# Patient Record
Sex: Male | Born: 1940
Health system: Southern US, Community
[De-identification: ages and names within clinical notes are randomized; demographics above are authoritative.]

## PROBLEM LIST (undated history)

## (undated) DIAGNOSIS — I639 Cerebral infarction, unspecified: Secondary | ICD-10-CM

## (undated) DIAGNOSIS — E118 Type 2 diabetes mellitus with unspecified complications: Secondary | ICD-10-CM

## (undated) DIAGNOSIS — K219 Gastro-esophageal reflux disease without esophagitis: Secondary | ICD-10-CM

## (undated) DIAGNOSIS — I499 Cardiac arrhythmia, unspecified: Secondary | ICD-10-CM

## (undated) DIAGNOSIS — J189 Pneumonia, unspecified organism: Secondary | ICD-10-CM

## (undated) DIAGNOSIS — E785 Hyperlipidemia, unspecified: Secondary | ICD-10-CM

## (undated) DIAGNOSIS — M1990 Unspecified osteoarthritis, unspecified site: Secondary | ICD-10-CM

## (undated) DIAGNOSIS — Z87442 Personal history of urinary calculi: Secondary | ICD-10-CM

## (undated) DIAGNOSIS — E119 Type 2 diabetes mellitus without complications: Secondary | ICD-10-CM

## (undated) DIAGNOSIS — I10 Essential (primary) hypertension: Secondary | ICD-10-CM

## (undated) DIAGNOSIS — N189 Chronic kidney disease, unspecified: Secondary | ICD-10-CM

## (undated) HISTORY — PX: VENTRAL HERNIA REPAIR: SHX424

## (undated) HISTORY — PX: CATARACT EXTRACTION W/ INTRAOCULAR LENS IMPLANT: SHX1309

## (undated) HISTORY — DX: Essential (primary) hypertension: I10

## (undated) HISTORY — PX: CARDIAC CATHETERIZATION: SHX172

## (undated) HISTORY — DX: Hyperlipidemia, unspecified: E78.5

## (undated) HISTORY — DX: Type 2 diabetes mellitus without complications: E11.9

## (undated) HISTORY — PX: COLONOSCOPY: SHX174

---
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
## 2015-04-07 DIAGNOSIS — I10 Essential (primary) hypertension: Secondary | ICD-10-CM | POA: Insufficient documentation

## 2015-04-07 DIAGNOSIS — R55 Syncope and collapse: Secondary | ICD-10-CM | POA: Insufficient documentation

## 2015-05-22 HISTORY — PX: CARDIAC CATHETERIZATION: SHX172

## 2015-05-24 DIAGNOSIS — R9431 Abnormal electrocardiogram [ECG] [EKG]: Secondary | ICD-10-CM | POA: Diagnosis not present

## 2015-05-24 DIAGNOSIS — I491 Atrial premature depolarization: Secondary | ICD-10-CM | POA: Diagnosis not present

## 2015-05-24 DIAGNOSIS — R9439 Abnormal result of other cardiovascular function study: Secondary | ICD-10-CM | POA: Diagnosis not present

## 2015-05-24 DIAGNOSIS — I499 Cardiac arrhythmia, unspecified: Secondary | ICD-10-CM | POA: Diagnosis not present

## 2015-05-24 DIAGNOSIS — I10 Essential (primary) hypertension: Secondary | ICD-10-CM | POA: Diagnosis not present

## 2015-05-24 DIAGNOSIS — R55 Syncope and collapse: Secondary | ICD-10-CM | POA: Diagnosis not present

## 2015-06-01 DIAGNOSIS — E119 Type 2 diabetes mellitus without complications: Secondary | ICD-10-CM | POA: Diagnosis not present

## 2015-06-14 DIAGNOSIS — E119 Type 2 diabetes mellitus without complications: Secondary | ICD-10-CM | POA: Diagnosis not present

## 2015-06-16 DIAGNOSIS — D225 Melanocytic nevi of trunk: Secondary | ICD-10-CM | POA: Diagnosis not present

## 2015-06-16 DIAGNOSIS — L820 Inflamed seborrheic keratosis: Secondary | ICD-10-CM | POA: Diagnosis not present

## 2015-06-16 DIAGNOSIS — L570 Actinic keratosis: Secondary | ICD-10-CM | POA: Diagnosis not present

## 2015-06-16 DIAGNOSIS — L821 Other seborrheic keratosis: Secondary | ICD-10-CM | POA: Diagnosis not present

## 2015-06-16 DIAGNOSIS — X32XXXA Exposure to sunlight, initial encounter: Secondary | ICD-10-CM | POA: Diagnosis not present

## 2015-06-28 DIAGNOSIS — R001 Bradycardia, unspecified: Secondary | ICD-10-CM | POA: Diagnosis not present

## 2015-06-28 DIAGNOSIS — I10 Essential (primary) hypertension: Secondary | ICD-10-CM | POA: Diagnosis not present

## 2015-06-28 DIAGNOSIS — E119 Type 2 diabetes mellitus without complications: Secondary | ICD-10-CM | POA: Diagnosis not present

## 2015-07-11 DIAGNOSIS — I10 Essential (primary) hypertension: Secondary | ICD-10-CM | POA: Diagnosis not present

## 2015-07-11 DIAGNOSIS — I499 Cardiac arrhythmia, unspecified: Secondary | ICD-10-CM | POA: Diagnosis not present

## 2015-07-11 DIAGNOSIS — R9439 Abnormal result of other cardiovascular function study: Secondary | ICD-10-CM | POA: Diagnosis not present

## 2015-07-11 DIAGNOSIS — R9431 Abnormal electrocardiogram [ECG] [EKG]: Secondary | ICD-10-CM | POA: Diagnosis not present

## 2015-07-11 DIAGNOSIS — R55 Syncope and collapse: Secondary | ICD-10-CM | POA: Diagnosis not present

## 2015-07-11 DIAGNOSIS — I491 Atrial premature depolarization: Secondary | ICD-10-CM | POA: Diagnosis not present

## 2015-07-14 DIAGNOSIS — H10423 Simple chronic conjunctivitis, bilateral: Secondary | ICD-10-CM | POA: Diagnosis not present

## 2015-07-21 DIAGNOSIS — H10423 Simple chronic conjunctivitis, bilateral: Secondary | ICD-10-CM | POA: Diagnosis not present

## 2015-07-21 DIAGNOSIS — H43813 Vitreous degeneration, bilateral: Secondary | ICD-10-CM | POA: Diagnosis not present

## 2015-07-29 DIAGNOSIS — H10423 Simple chronic conjunctivitis, bilateral: Secondary | ICD-10-CM | POA: Diagnosis not present

## 2015-08-04 DIAGNOSIS — I491 Atrial premature depolarization: Secondary | ICD-10-CM | POA: Diagnosis not present

## 2015-08-04 DIAGNOSIS — R9439 Abnormal result of other cardiovascular function study: Secondary | ICD-10-CM | POA: Diagnosis not present

## 2015-08-04 DIAGNOSIS — I10 Essential (primary) hypertension: Secondary | ICD-10-CM | POA: Diagnosis not present

## 2015-08-16 DIAGNOSIS — H10423 Simple chronic conjunctivitis, bilateral: Secondary | ICD-10-CM | POA: Diagnosis not present

## 2015-09-20 DIAGNOSIS — E785 Hyperlipidemia, unspecified: Secondary | ICD-10-CM | POA: Diagnosis not present

## 2015-09-20 DIAGNOSIS — E119 Type 2 diabetes mellitus without complications: Secondary | ICD-10-CM | POA: Diagnosis not present

## 2015-09-28 DIAGNOSIS — E119 Type 2 diabetes mellitus without complications: Secondary | ICD-10-CM | POA: Diagnosis not present

## 2015-09-28 DIAGNOSIS — I4891 Unspecified atrial fibrillation: Secondary | ICD-10-CM | POA: Diagnosis not present

## 2015-09-28 DIAGNOSIS — I10 Essential (primary) hypertension: Secondary | ICD-10-CM | POA: Diagnosis not present

## 2015-09-28 DIAGNOSIS — E785 Hyperlipidemia, unspecified: Secondary | ICD-10-CM | POA: Diagnosis not present

## 2015-12-02 DIAGNOSIS — H02834 Dermatochalasis of left upper eyelid: Secondary | ICD-10-CM | POA: Diagnosis not present

## 2015-12-02 DIAGNOSIS — H524 Presbyopia: Secondary | ICD-10-CM | POA: Diagnosis not present

## 2015-12-02 DIAGNOSIS — H52223 Regular astigmatism, bilateral: Secondary | ICD-10-CM | POA: Diagnosis not present

## 2015-12-02 DIAGNOSIS — H2513 Age-related nuclear cataract, bilateral: Secondary | ICD-10-CM | POA: Diagnosis not present

## 2015-12-02 DIAGNOSIS — H04123 Dry eye syndrome of bilateral lacrimal glands: Secondary | ICD-10-CM | POA: Diagnosis not present

## 2015-12-02 DIAGNOSIS — H5203 Hypermetropia, bilateral: Secondary | ICD-10-CM | POA: Diagnosis not present

## 2015-12-02 DIAGNOSIS — E119 Type 2 diabetes mellitus without complications: Secondary | ICD-10-CM | POA: Diagnosis not present

## 2015-12-02 DIAGNOSIS — H43813 Vitreous degeneration, bilateral: Secondary | ICD-10-CM | POA: Diagnosis not present

## 2015-12-02 DIAGNOSIS — H02831 Dermatochalasis of right upper eyelid: Secondary | ICD-10-CM | POA: Diagnosis not present

## 2015-12-26 DIAGNOSIS — E119 Type 2 diabetes mellitus without complications: Secondary | ICD-10-CM | POA: Diagnosis not present

## 2015-12-26 DIAGNOSIS — E782 Mixed hyperlipidemia: Secondary | ICD-10-CM | POA: Diagnosis not present

## 2016-01-04 DIAGNOSIS — K589 Irritable bowel syndrome without diarrhea: Secondary | ICD-10-CM | POA: Diagnosis not present

## 2016-01-04 DIAGNOSIS — E119 Type 2 diabetes mellitus without complications: Secondary | ICD-10-CM | POA: Diagnosis not present

## 2016-01-04 DIAGNOSIS — E785 Hyperlipidemia, unspecified: Secondary | ICD-10-CM | POA: Diagnosis not present

## 2016-01-04 DIAGNOSIS — I10 Essential (primary) hypertension: Secondary | ICD-10-CM | POA: Diagnosis not present

## 2016-01-24 DIAGNOSIS — I491 Atrial premature depolarization: Secondary | ICD-10-CM | POA: Diagnosis not present

## 2016-01-24 DIAGNOSIS — I10 Essential (primary) hypertension: Secondary | ICD-10-CM | POA: Diagnosis not present

## 2016-01-24 DIAGNOSIS — R9439 Abnormal result of other cardiovascular function study: Secondary | ICD-10-CM | POA: Diagnosis not present

## 2016-02-07 DIAGNOSIS — Z905 Acquired absence of kidney: Secondary | ICD-10-CM | POA: Diagnosis not present

## 2016-02-07 DIAGNOSIS — N200 Calculus of kidney: Secondary | ICD-10-CM | POA: Diagnosis not present

## 2016-02-07 DIAGNOSIS — Z7901 Long term (current) use of anticoagulants: Secondary | ICD-10-CM | POA: Diagnosis not present

## 2016-02-07 DIAGNOSIS — I491 Atrial premature depolarization: Secondary | ICD-10-CM | POA: Diagnosis not present

## 2016-02-07 DIAGNOSIS — I480 Paroxysmal atrial fibrillation: Secondary | ICD-10-CM | POA: Diagnosis not present

## 2016-02-07 DIAGNOSIS — I129 Hypertensive chronic kidney disease with stage 1 through stage 4 chronic kidney disease, or unspecified chronic kidney disease: Secondary | ICD-10-CM | POA: Diagnosis not present

## 2016-02-07 DIAGNOSIS — E785 Hyperlipidemia, unspecified: Secondary | ICD-10-CM | POA: Diagnosis not present

## 2016-02-07 DIAGNOSIS — E1122 Type 2 diabetes mellitus with diabetic chronic kidney disease: Secondary | ICD-10-CM | POA: Diagnosis not present

## 2016-02-07 DIAGNOSIS — E1142 Type 2 diabetes mellitus with diabetic polyneuropathy: Secondary | ICD-10-CM | POA: Diagnosis not present

## 2016-02-07 DIAGNOSIS — I517 Cardiomegaly: Secondary | ICD-10-CM | POA: Diagnosis not present

## 2016-02-07 DIAGNOSIS — I2510 Atherosclerotic heart disease of native coronary artery without angina pectoris: Secondary | ICD-10-CM | POA: Diagnosis not present

## 2016-02-07 DIAGNOSIS — Z7984 Long term (current) use of oral hypoglycemic drugs: Secondary | ICD-10-CM | POA: Diagnosis not present

## 2016-02-07 DIAGNOSIS — Z87891 Personal history of nicotine dependence: Secondary | ICD-10-CM | POA: Diagnosis not present

## 2016-02-07 DIAGNOSIS — Z01812 Encounter for preprocedural laboratory examination: Secondary | ICD-10-CM | POA: Diagnosis not present

## 2016-02-07 DIAGNOSIS — R9439 Abnormal result of other cardiovascular function study: Secondary | ICD-10-CM | POA: Diagnosis not present

## 2016-02-07 DIAGNOSIS — Z794 Long term (current) use of insulin: Secondary | ICD-10-CM | POA: Diagnosis not present

## 2016-02-07 DIAGNOSIS — Z01810 Encounter for preprocedural cardiovascular examination: Secondary | ICD-10-CM | POA: Diagnosis not present

## 2016-02-07 DIAGNOSIS — Z79899 Other long term (current) drug therapy: Secondary | ICD-10-CM | POA: Diagnosis not present

## 2016-02-07 DIAGNOSIS — N189 Chronic kidney disease, unspecified: Secondary | ICD-10-CM | POA: Diagnosis not present

## 2016-02-07 DIAGNOSIS — K589 Irritable bowel syndrome without diarrhea: Secondary | ICD-10-CM | POA: Diagnosis not present

## 2016-02-07 DIAGNOSIS — I771 Stricture of artery: Secondary | ICD-10-CM | POA: Diagnosis not present

## 2016-02-07 DIAGNOSIS — Z7982 Long term (current) use of aspirin: Secondary | ICD-10-CM | POA: Diagnosis not present

## 2016-02-07 DIAGNOSIS — K219 Gastro-esophageal reflux disease without esophagitis: Secondary | ICD-10-CM | POA: Diagnosis not present

## 2016-02-07 DIAGNOSIS — R9430 Abnormal result of cardiovascular function study, unspecified: Secondary | ICD-10-CM | POA: Diagnosis not present

## 2016-02-07 DIAGNOSIS — I25118 Atherosclerotic heart disease of native coronary artery with other forms of angina pectoris: Secondary | ICD-10-CM | POA: Diagnosis not present

## 2016-02-07 DIAGNOSIS — R079 Chest pain, unspecified: Secondary | ICD-10-CM | POA: Diagnosis not present

## 2016-02-08 DIAGNOSIS — I480 Paroxysmal atrial fibrillation: Secondary | ICD-10-CM | POA: Diagnosis not present

## 2016-02-08 DIAGNOSIS — I25118 Atherosclerotic heart disease of native coronary artery with other forms of angina pectoris: Secondary | ICD-10-CM | POA: Diagnosis not present

## 2016-02-08 DIAGNOSIS — R079 Chest pain, unspecified: Secondary | ICD-10-CM | POA: Diagnosis not present

## 2016-02-08 DIAGNOSIS — I2510 Atherosclerotic heart disease of native coronary artery without angina pectoris: Secondary | ICD-10-CM | POA: Diagnosis not present

## 2016-02-08 DIAGNOSIS — E1142 Type 2 diabetes mellitus with diabetic polyneuropathy: Secondary | ICD-10-CM | POA: Diagnosis not present

## 2016-02-08 DIAGNOSIS — R9439 Abnormal result of other cardiovascular function study: Secondary | ICD-10-CM | POA: Diagnosis not present

## 2016-02-08 DIAGNOSIS — E785 Hyperlipidemia, unspecified: Secondary | ICD-10-CM | POA: Diagnosis not present

## 2016-02-08 DIAGNOSIS — E1122 Type 2 diabetes mellitus with diabetic chronic kidney disease: Secondary | ICD-10-CM | POA: Diagnosis not present

## 2016-02-08 DIAGNOSIS — I491 Atrial premature depolarization: Secondary | ICD-10-CM | POA: Diagnosis not present

## 2016-02-08 DIAGNOSIS — I129 Hypertensive chronic kidney disease with stage 1 through stage 4 chronic kidney disease, or unspecified chronic kidney disease: Secondary | ICD-10-CM | POA: Diagnosis not present

## 2016-03-22 DIAGNOSIS — I10 Essential (primary) hypertension: Secondary | ICD-10-CM | POA: Diagnosis not present

## 2016-03-22 DIAGNOSIS — I491 Atrial premature depolarization: Secondary | ICD-10-CM | POA: Diagnosis not present

## 2016-03-22 DIAGNOSIS — R9439 Abnormal result of other cardiovascular function study: Secondary | ICD-10-CM | POA: Diagnosis not present

## 2016-03-22 DIAGNOSIS — R55 Syncope and collapse: Secondary | ICD-10-CM | POA: Diagnosis not present

## 2016-03-22 DIAGNOSIS — R9431 Abnormal electrocardiogram [ECG] [EKG]: Secondary | ICD-10-CM | POA: Diagnosis not present

## 2016-04-17 DIAGNOSIS — E119 Type 2 diabetes mellitus without complications: Secondary | ICD-10-CM | POA: Diagnosis not present

## 2016-04-17 DIAGNOSIS — K589 Irritable bowel syndrome without diarrhea: Secondary | ICD-10-CM | POA: Diagnosis not present

## 2016-04-17 DIAGNOSIS — I10 Essential (primary) hypertension: Secondary | ICD-10-CM | POA: Diagnosis not present

## 2016-07-02 DIAGNOSIS — E119 Type 2 diabetes mellitus without complications: Secondary | ICD-10-CM | POA: Diagnosis not present

## 2016-07-02 DIAGNOSIS — E785 Hyperlipidemia, unspecified: Secondary | ICD-10-CM | POA: Diagnosis not present

## 2016-07-11 DIAGNOSIS — G629 Polyneuropathy, unspecified: Secondary | ICD-10-CM | POA: Diagnosis not present

## 2016-07-11 DIAGNOSIS — E119 Type 2 diabetes mellitus without complications: Secondary | ICD-10-CM | POA: Diagnosis not present

## 2016-07-11 DIAGNOSIS — E785 Hyperlipidemia, unspecified: Secondary | ICD-10-CM | POA: Diagnosis not present

## 2016-07-11 DIAGNOSIS — I10 Essential (primary) hypertension: Secondary | ICD-10-CM | POA: Diagnosis not present

## 2016-11-06 DIAGNOSIS — Z7689 Persons encountering health services in other specified circumstances: Secondary | ICD-10-CM | POA: Diagnosis not present

## 2016-11-06 DIAGNOSIS — I10 Essential (primary) hypertension: Secondary | ICD-10-CM | POA: Diagnosis not present

## 2016-11-06 DIAGNOSIS — E1165 Type 2 diabetes mellitus with hyperglycemia: Secondary | ICD-10-CM | POA: Diagnosis not present

## 2016-11-06 DIAGNOSIS — E785 Hyperlipidemia, unspecified: Secondary | ICD-10-CM | POA: Diagnosis not present

## 2016-11-06 DIAGNOSIS — K588 Other irritable bowel syndrome: Secondary | ICD-10-CM | POA: Diagnosis not present

## 2016-11-06 DIAGNOSIS — Z7984 Long term (current) use of oral hypoglycemic drugs: Secondary | ICD-10-CM | POA: Diagnosis not present

## 2016-11-06 DIAGNOSIS — K219 Gastro-esophageal reflux disease without esophagitis: Secondary | ICD-10-CM | POA: Diagnosis not present

## 2016-12-13 DIAGNOSIS — H524 Presbyopia: Secondary | ICD-10-CM | POA: Diagnosis not present

## 2016-12-13 DIAGNOSIS — I10 Essential (primary) hypertension: Secondary | ICD-10-CM | POA: Diagnosis not present

## 2016-12-13 DIAGNOSIS — E119 Type 2 diabetes mellitus without complications: Secondary | ICD-10-CM | POA: Diagnosis not present

## 2016-12-13 DIAGNOSIS — H35033 Hypertensive retinopathy, bilateral: Secondary | ICD-10-CM | POA: Diagnosis not present

## 2017-02-06 DIAGNOSIS — Z1389 Encounter for screening for other disorder: Secondary | ICD-10-CM | POA: Diagnosis not present

## 2017-02-06 DIAGNOSIS — I10 Essential (primary) hypertension: Secondary | ICD-10-CM | POA: Diagnosis not present

## 2017-02-06 DIAGNOSIS — Z0000 Encounter for general adult medical examination without abnormal findings: Secondary | ICD-10-CM | POA: Diagnosis not present

## 2017-02-06 DIAGNOSIS — Z125 Encounter for screening for malignant neoplasm of prostate: Secondary | ICD-10-CM | POA: Diagnosis not present

## 2017-02-06 DIAGNOSIS — E1122 Type 2 diabetes mellitus with diabetic chronic kidney disease: Secondary | ICD-10-CM | POA: Diagnosis not present

## 2017-02-06 DIAGNOSIS — N183 Chronic kidney disease, stage 3 (moderate): Secondary | ICD-10-CM | POA: Diagnosis not present

## 2017-02-06 DIAGNOSIS — E1165 Type 2 diabetes mellitus with hyperglycemia: Secondary | ICD-10-CM | POA: Diagnosis not present

## 2017-02-06 DIAGNOSIS — Z23 Encounter for immunization: Secondary | ICD-10-CM | POA: Diagnosis not present

## 2017-02-06 DIAGNOSIS — E785 Hyperlipidemia, unspecified: Secondary | ICD-10-CM | POA: Diagnosis not present

## 2017-02-06 DIAGNOSIS — K219 Gastro-esophageal reflux disease without esophagitis: Secondary | ICD-10-CM | POA: Diagnosis not present

## 2017-02-06 DIAGNOSIS — I2510 Atherosclerotic heart disease of native coronary artery without angina pectoris: Secondary | ICD-10-CM | POA: Diagnosis not present

## 2017-03-26 DIAGNOSIS — D72828 Other elevated white blood cell count: Secondary | ICD-10-CM | POA: Diagnosis not present

## 2017-04-04 DIAGNOSIS — N528 Other male erectile dysfunction: Secondary | ICD-10-CM | POA: Diagnosis not present

## 2017-04-04 DIAGNOSIS — Z87442 Personal history of urinary calculi: Secondary | ICD-10-CM | POA: Diagnosis not present

## 2017-04-04 DIAGNOSIS — R972 Elevated prostate specific antigen [PSA]: Secondary | ICD-10-CM | POA: Diagnosis not present

## 2017-04-15 DIAGNOSIS — R972 Elevated prostate specific antigen [PSA]: Secondary | ICD-10-CM | POA: Diagnosis not present

## 2017-04-15 DIAGNOSIS — N411 Chronic prostatitis: Secondary | ICD-10-CM | POA: Diagnosis not present

## 2017-04-26 DIAGNOSIS — D72828 Other elevated white blood cell count: Secondary | ICD-10-CM | POA: Diagnosis not present

## 2017-05-13 ENCOUNTER — Telehealth: Payer: Self-pay | Admitting: Hematology

## 2017-05-13 NOTE — Telephone Encounter (Signed)
Spoke with patient regarding appointment date/time/location & phone #  °

## 2017-06-12 NOTE — Progress Notes (Signed)
CONSULT NOTE  Patient Care Team: [name], MD as PCP - General (Family Medicine)  CHIEF COMPLAINTS/PURPOSE OF CONSULTATION:  leucocytosis  HISTORY OF PRESENTING ILLNESS:   Eric Harrington 77 y.o. male is here because of a referral from Dr. Inda Castle from [place] regarding a trend in his elevated WBC.   He is accompanied today by his wife of 79 years. The pt reports that he is doing well overall. He recently had a biopsy to check his prostate at [facility] with Dr. Aleen Campi and they took 12 core samples with reportedly no significant findings.  Of note prior to the patient's visit today, pt has had CBC completed on 04/26/17 with results revealing WBC at 14.7, Hgb at 12.6, Lymph Abs at 11.0 with all other values WNL. On 03/26/17 his WBC were 12.8, Hgb at 13.3 and Lymphs at  9.90. On 02/06/17 his WBC were 12.7, Hgb at 13.4, and Lymphs at 8.80.   On review of systems, pt reports post nasal drip, persistent cough, occasional troublesome stomach, pain in his lower abdomen that feels like his muscles are irritated, reports he has lost 25 lbs in the last 2.5 years (he associates a decreased appetite after taking beta blockers during this period) and denies no recent colds or infections, acute changes in energy levels, and leg swelling.   On PMHx the pt reports taking Zetia. He has IBS and has had 3 colonoscopies with no significant findings or inflammatory processes. He reports that over the last 2.5 years he had heart catheterizations that all turned out well. He reports having diabetes. He also reports neuropathy along his whole right side that lasted for a few months that ceased abruptly.   MEDICAL HISTORY:  1. HTN 2. DM2 3. H/o PAC's 4. transient a fib with cardiac cath 5. HLD 6. CKD stage 3 7. Irritable bowel syndrome 8. GERD 9. H/o presyndope  SURGICAL HISTORY:  1. Prostate bx 2. Cardiac cath  SOCIAL HISTORY: Social History   Socioeconomic History  .  Marital status: Married    Spouse name: Not on file  . Number of children: Not on file  . Years of education: Not on file  . Highest education level: Not on file  Social Needs  . Financial resource strain: Not on file  . Food insecurity - worry: Not on file  . Food insecurity - inability: Not on file  . Transportation needs - medical: Not on file  . Transportation needs - non-medical: Not on file  Occupational History  . Not on file  Tobacco Use  . Smoking status: Not on file  Substance and Sexual Activity  . Alcohol use: Not on file  . Drug use: Not on file  . Sexual activity: Not on file  Other Topics Concern  . Not on file  Social History Narrative  . Not on file    FAMILY HISTORY: No family history on file.  ALLERGIES:  is allergic to bystolic [nebivolol hcl]; cephalexin; gemfibrozil; glipizide; januvia [sitagliptin]; lipitor [atorvastatin calcium]; losartan; metoprolol; omeprazole; simvastatin; and gabapentin.  MEDICATIONS:  Current Outpatient Medications  Medication Sig Dispense Refill  . carboxymethylcellulose (REFRESH PLUS) 0.5 % SOLN 1 drop 3 (three) times daily as needed.    . docusate sodium (COLACE) 100 MG capsule Take 100 mg by mouth 2 (two) times daily.    Marland Kitchen ezetimibe (ZETIA) 10 MG tablet Take 10 mg by mouth daily.    . Glucose Blood (IGLUCOSE TEST STRIPS VI) by In  Vitro route.    . Lancets MISC by Does not apply route.    . metFORMIN (GLUCOPHAGE) 1000 MG tablet Take 1,000 mg by mouth 2 (two) times daily.     No current facility-administered medications for this visit.     REVIEW OF SYSTEMS:   Constitutional: Denies fevers, chills or abnormal night sweats Eyes: Denies blurriness of vision, double vision or watery eyes Ears, nose, mouth, throat, and face: Denies mucositis or sore throat Respiratory: Denies cough, dyspnea or wheezes Cardiovascular: Denies palpitation, chest discomfort or lower extremity swelling Gastrointestinal:  Denies nausea, heartburn  or change in bowel habits Skin: Denies abnormal skin rashes Lymphatics: Denies new lymphadenopathy or easy bruising Neurological:Denies numbness, tingling or new weaknesses Behavioral/Psych: Mood is stable, no new changes  All other systems were reviewed with the patient and are negative.  PHYSICAL EXAMINATION:  Vitals:   06/13/17 1009  BP: (!) 154/93  Pulse: 77  Resp: 18  Temp: 97.7 F (36.5 C)  SpO2: 98%   Filed Weights   06/13/17 1009  Weight: 180 lb 6.4 oz (81.8 kg)    GENERAL:alert, no distress and comfortable SKIN: skin color, texture, turgor are normal, no rashes or significant lesions EYES: normal, conjunctiva are pink and non-injected, sclera clear OROPHARYNX:no exudate, no erythema and lips, buccal mucosa, and tongue normal  NECK: supple, thyroid normal size, non-tender, without nodularity LYMPH:  no palpable lymphadenopathy in the cervical, axillary or inguinal LUNGS: clear to auscultation and percussion with normal breathing effort HEART: regular rate & rhythm and no murmurs and no lower extremity edema ABDOMEN:abdomen soft, non-tender and normal bowel sounds, no splenomegaly Musculoskeletal: no cyanosis of digits and no clubbing no pedal edema, no palpable hepatosplenomegaly. PSYCH: alert & oriented x 3 with fluent speech NEURO: no focal motor/sensory deficits  LABORATORY DATA:  I have reviewed the data as listed  . CBC Latest Ref Rng & Units 06/13/2017  WBC 4.0 - 10.3 K/uL 12.6(H)  Hematocrit 38.4 - 49.9 % 37.4(L)  Platelets 140 - 400 K/uL 143   . CBC    Component Value Date/Time   WBC 12.6 (H) 06/13/2017 1044   RBC 4.25 06/13/2017 1044   RBC 4.25 06/13/2017 1044   HCT 37.4 (L) 06/13/2017 1044   PLT 143 06/13/2017 1044   MCV 88.0 06/13/2017 1044   MCH 30.6 06/13/2017 1044   MCHC 34.8 06/13/2017 1044   RDW 12.6 06/13/2017 1044   LYMPHSABS 9.0 (H) 06/13/2017 1044   MONOABS 0.5 06/13/2017 1044   EOSABS 0.1 06/13/2017 1044   BASOSABS 0.0  06/13/2017 1044   .Marland Kitchen Lab Results  Component Value Date   RETICCTPCT 1.1 06/13/2017   RBC 4.25 06/13/2017   RBC 4.25 06/13/2017    CMP Latest Ref Rng & Units 06/13/2017  Glucose 70 - 140 mg/dL 124  BUN 7 - 26 mg/dL 17  Sodium 136 - 145 mmol/L 136  Potassium 3.5 - 5.1 mmol/L 4.3  Chloride 98 - 109 mmol/L 102  CO2 22 - 29 mmol/L 26  Calcium 8.4 - 10.4 mg/dL 9.5  Total Protein 6.4 - 8.3 g/dL 6.7  Total Bilirubin 0.2 - 1.2 mg/dL 0.4  Alkaline Phos 40 - 150 U/L 93  AST 5 - 34 U/L 11  ALT 0 - 55 U/L 8   . Lab Results  Component Value Date   LDH 130 06/13/2017   Component     Latest Ref Rng & Units 06/13/2017  HCV Ab     0.0 - 0.9 s/co ratio <  0.1  Hepatitis B Surface Ag     Negative Negative  Hep B Core Ab, Tot     Negative Negative      Hematopath Consultation Smear 04/26/17    Hematopath Consultation Smear (04/26/17) contd. And CBC from 04/26/17    RADIOGRAPHIC STUDIES: I have personally reviewed the radiological images as listed and agreed with the findings in the report. No results found.  ASSESSMENT & PLAN:  77 y.o. is a male with  1. Lymphocytosis Incidentally noted on routine labs No associated significant anemia or thrombocytopenia. No constitutional symptoms No overt clinically palpable LNadenopathy or hepato-splenomegaly.  PLAN -We reviewed the trends of his persistent upward trend in his Lymphocyte count, and his steady neutrophil count and blood counts. -Discussed the potential etiologies of lymphocytosis -both reactive and clonal -Pattern of increasing lymphocytosis and PBS suggestive of CLL therefore we will recommend and had a flow cytometry done -which was found to be consistent with CLL -hepatitis profile - unrevealing -Will obtain labs today -Follow up in one week to discuss results of lab workup and discuss his new diagnosis of CLL and further workup/management.  Plan:  Labs today RTC in 7-10 days with Dr Irene Limbo   All questions were  answered. The patient knows to call the clinic with any problems, questions or concerns. I spent 35 minutes counseling the patient face to face. The total time spent in the appointment was 45 minutes and more than 50% was on counseling.     This document serves as a record of services personally performed by Sullivan Lone, MD. It was created on his behalf by Baldwin Jamaica, a trained medical scribe. The creation of this record is based on the scribe's personal observations and the provider's statements to them.   .I have reviewed the above documentation for accuracy and completeness, and I agree with the above. Brunetta Genera MD MS

## 2017-06-13 ENCOUNTER — Telehealth: Payer: Self-pay | Admitting: Hematology

## 2017-06-13 ENCOUNTER — Inpatient Hospital Stay: Payer: Medicare Other | Attending: Hematology | Admitting: Hematology

## 2017-06-13 ENCOUNTER — Inpatient Hospital Stay: Payer: Medicare Other

## 2017-06-13 VITALS — BP 154/93 | HR 77 | Temp 97.7°F | Resp 18 | Ht 70.0 in | Wt 180.4 lb

## 2017-06-13 DIAGNOSIS — E119 Type 2 diabetes mellitus without complications: Secondary | ICD-10-CM | POA: Diagnosis not present

## 2017-06-13 DIAGNOSIS — R634 Abnormal weight loss: Secondary | ICD-10-CM

## 2017-06-13 DIAGNOSIS — I4891 Unspecified atrial fibrillation: Secondary | ICD-10-CM | POA: Diagnosis not present

## 2017-06-13 DIAGNOSIS — R109 Unspecified abdominal pain: Secondary | ICD-10-CM

## 2017-06-13 DIAGNOSIS — K219 Gastro-esophageal reflux disease without esophagitis: Secondary | ICD-10-CM

## 2017-06-13 DIAGNOSIS — E785 Hyperlipidemia, unspecified: Secondary | ICD-10-CM

## 2017-06-13 DIAGNOSIS — D72820 Lymphocytosis (symptomatic): Secondary | ICD-10-CM | POA: Diagnosis not present

## 2017-06-13 DIAGNOSIS — K589 Irritable bowel syndrome without diarrhea: Secondary | ICD-10-CM

## 2017-06-13 DIAGNOSIS — Z7984 Long term (current) use of oral hypoglycemic drugs: Secondary | ICD-10-CM | POA: Diagnosis not present

## 2017-06-13 DIAGNOSIS — N183 Chronic kidney disease, stage 3 (moderate): Secondary | ICD-10-CM

## 2017-06-13 DIAGNOSIS — R05 Cough: Secondary | ICD-10-CM | POA: Diagnosis not present

## 2017-06-13 DIAGNOSIS — Z79899 Other long term (current) drug therapy: Secondary | ICD-10-CM | POA: Diagnosis not present

## 2017-06-13 DIAGNOSIS — I129 Hypertensive chronic kidney disease with stage 1 through stage 4 chronic kidney disease, or unspecified chronic kidney disease: Secondary | ICD-10-CM

## 2017-06-13 DIAGNOSIS — C9110 Chronic lymphocytic leukemia of B-cell type not having achieved remission: Secondary | ICD-10-CM

## 2017-06-13 LAB — CMP (CANCER CENTER ONLY)
ALT: 8 U/L (ref 0–55)
AST: 11 U/L (ref 5–34)
Albumin: 4 g/dL (ref 3.5–5.0)
Alkaline Phosphatase: 93 U/L (ref 40–150)
Anion gap: 8 (ref 3–11)
BILIRUBIN TOTAL: 0.4 mg/dL (ref 0.2–1.2)
BUN: 17 mg/dL (ref 7–26)
CALCIUM: 9.5 mg/dL (ref 8.4–10.4)
CHLORIDE: 102 mmol/L (ref 98–109)
CO2: 26 mmol/L (ref 22–29)
Creatinine: 1.22 mg/dL (ref 0.70–1.30)
GFR, EST NON AFRICAN AMERICAN: 57 mL/min — AB (ref >60)
Glucose, Bld: 124 mg/dL (ref 70–140)
Potassium: 4.3 mmol/L (ref 3.5–5.1)
Sodium: 136 mmol/L (ref 136–145)
TOTAL PROTEIN: 6.7 g/dL (ref 6.4–8.3)

## 2017-06-13 LAB — RETICULOCYTES
RBC.: 4.25 MIL/uL (ref 4.20–5.82)
RETIC COUNT ABSOLUTE: 46.8 10*3/uL (ref 34.8–93.9)
RETIC CT PCT: 1.1 % (ref 0.8–1.8)

## 2017-06-13 LAB — LACTATE DEHYDROGENASE: LDH: 130 U/L (ref 125–245)

## 2017-06-13 LAB — CBC WITH DIFFERENTIAL (CANCER CENTER ONLY)
BASOS PCT: 0 %
Basophils Absolute: 0 10*3/uL (ref 0.0–0.1)
Eosinophils Absolute: 0.1 10*3/uL (ref 0.0–0.5)
Eosinophils Relative: 1 %
HCT: 37.4 % — ABNORMAL LOW (ref 38.4–49.9)
HEMOGLOBIN: 13 g/dL (ref 13.0–17.1)
Lymphocytes Relative: 72 %
Lymphs Abs: 9 10*3/uL — ABNORMAL HIGH (ref 0.9–3.3)
MCH: 30.6 pg (ref 27.2–33.4)
MCHC: 34.8 g/dL (ref 32.0–36.0)
MCV: 88 fL (ref 79.3–98.0)
MONO ABS: 0.5 10*3/uL (ref 0.1–0.9)
Monocytes Relative: 4 %
NEUTROS ABS: 2.9 10*3/uL (ref 1.5–6.5)
Neutrophils Relative %: 23 %
PLATELETS: 143 10*3/uL (ref 140–400)
RBC: 4.25 MIL/uL (ref 4.20–5.82)
RDW: 12.6 % (ref 11.0–15.6)
WBC Count: 12.6 10*3/uL — ABNORMAL HIGH (ref 4.0–10.3)

## 2017-06-13 LAB — SAVE SMEAR

## 2017-06-13 NOTE — Patient Instructions (Signed)
Thank you for choosing [facility] to provide your oncology and hematology care.  To afford each patient quality time with our providers, please arrive 30 minutes before your scheduled appointment time.  If you arrive late for your appointment, you may be asked to reschedule.  We strive to give you quality time with our providers, and arriving late affects you and other patients whose appointments are after yours.   If you are a no show for multiple scheduled visits, you may be dismissed from the clinic at the providers discretion.    Again, thank you for choosing [facility], our hope is that these requests will decrease the amount of time that you wait before being seen by our physicians.  ______________________________________________________________________  Should you have questions after your visit to the  Cancer Center, please contact our office at [phone] between the hours of 8:30 and 4:30 p.m.    Voicemails left after 4:30p.m will not be returned until the following business day.    For prescription refill requests, please have your pharmacy contact us directly.  Please also try to allow 48 hours for prescription requests.    Please contact the scheduling department for questions regarding scheduling.  For scheduling of procedures such as PET scans, CT scans, MRI, Ultrasound, etc please contact central scheduling at [phone].    Resources For Cancer Patients and Caregivers:   Oncolink.org:  A wonderful resource for patients and healthcare providers for information regarding your disease, ways to tract your treatment, what to expect, etc.     American Cancer Society:  800-227-2345  Can help patients locate various types of support and financial assistance  Cancer Care: 1-800-813-HOPE (4673) Provides financial assistance, online support groups, medication/co-pay assistance.    [place] DSS:  336-641-3447 Where to apply for food  stamps, Medicaid, and utility assistance  Medicare Rights Center: 800-333-4114 Helps people with Medicare understand their rights and benefits, navigate the Medicare system, and secure the quality healthcare they deserve  SCAT: 336-333-6589 [place] [employer]'s shared-ride transportation service for eligible riders who have a disability that prevents them from riding the fixed route bus.    For additional information on assistance programs please contact our social worker:   Grier Hock/Abigail Elmore:  [phone]            

## 2017-06-13 NOTE — Telephone Encounter (Signed)
Gave avs and calendar for January and february °

## 2017-06-14 ENCOUNTER — Telehealth: Payer: Self-pay | Admitting: Hematology

## 2017-06-14 LAB — HEPATITIS C ANTIBODY

## 2017-06-14 LAB — HEPATITIS B CORE ANTIBODY, TOTAL: HEP B C TOTAL AB: NEGATIVE

## 2017-06-14 LAB — HEPATITIS B SURFACE ANTIGEN: Hepatitis B Surface Ag: NEGATIVE

## 2017-06-14 NOTE — Telephone Encounter (Signed)
Patient called to reschedule  °

## 2017-06-28 LAB — FLOW CYTOMETRY

## 2017-06-28 NOTE — Progress Notes (Signed)
HEMATOLOGY ONCOLOGY CLINIC NOTE  Patient Care Team: [name], MD as PCP - General (Family Medicine)  CHIEF COMPLAINTS/PURPOSE OF CONSULTATION:  F/u for leucocytosis  HISTORY OF PRESENTING ILLNESS:   Eric Harrington 77 y.o. male is here because of a referral from Dr. Inda Castle from [employer] at [facility] regarding a trend in his elevated WBC.   He is accompanied today by his wife of 108 years. The pt reports that he is doing well overall. He recently had a biopsy to check his prostate at [facility] with Dr. Aleen Campi and they took 12 core samples with reportedly no significant findings.  Of note prior to the patient's visit today, pt has had CBC completed on 04/26/17 with results revealing WBC at 14.7, Hgb at 12.6, Lymph Abs at 11.0 with all other values WNL. On 03/26/17 his WBC were 12.8, Hgb at 13.3 and Lymphs at  9.90. On 02/06/17 his WBC were 12.7, Hgb at 13.4, and Lymphs at 8.80.   On review of systems, pt reports post nasal drip, persistent cough, occasional troublesome stomach, pain in his lower abdomen that feels like his muscles are irritated, reports he has lost 25 lbs in the last 2.5 years (he associates a decreased appetite after taking beta blockers during this period) and denies no recent colds or infections, acute changes in energy levels, and leg swelling.   On PMHx the pt reports taking Zetia. He has IBS and has had 3 colonoscopies with no significant findings or inflammatory processes. He reports that over the last 2.5 years he had heart catheterizations that all turned out well. He reports having diabetes. He also reports neuropathy along his whole right side that lasted for a few months that ceased abruptly.   Interval History:  Eric Harrington returns today regarding a trend in his elevated WBC. The patient's last visit with [place] was on 06/13/17. He is accompanied today by his wife and daughter. The pt reports that he is doing well overall. He notes dull pain that does not  radiate in his lower abdomen, that is worsened with lifting.   Of note since the patient last visit, pt has had Peripheral Blood Flow Cytometry completed on 06/13/17 with results revealing a Monoclonal B Cell population concerning for CD5+ve lymphoproliferative disorder -- likely CLL.  Most recent labs from 06/13/17 of CBC, CMP, and Reticulocytes is as follows: all values are WNL except for WBC at 12.6k, HCT at 37.4, and Lymphs Abs at 9.0k. On 06/13/17 LDH was at 130.   On review of systems, pt reports a little fatigue, low abdomen pain (which he attributes to his IBS),  and denies drenching night sweats, noticing any enlarged lymph nodes, and any other symptoms.   MEDICAL HISTORY:  1. HTN 2. DM2 3. H/o PAC's 4. transient a fib with cardiac cath 5. HLD 6. CKD stage 3 7. Irritable bowel syndrome 8. GERD 9. H/o presyndope  SURGICAL HISTORY:  1. Prostate bx 2. Cardiac cath  SOCIAL HISTORY: Social History   Socioeconomic History  . Marital status: Married    Spouse name: Not on file  . Number of children: Not on file  . Years of education: Not on file  . Highest education level: Not on file  Social Needs  . Financial resource strain: Not on file  . Food insecurity - worry: Not on file  . Food insecurity - inability: Not on file  . Transportation needs - medical: Not on file  . Transportation needs - non-medical:  Not on file  Occupational History  . Not on file  Tobacco Use  . Smoking status: Not on file  Substance and Sexual Activity  . Alcohol use: Not on file  . Drug use: Not on file  . Sexual activity: Not on file  Other Topics Concern  . Not on file  Social History Narrative  . Not on file    FAMILY HISTORY: No family history on file.  ALLERGIES:  is allergic to bystolic [nebivolol hcl]; cephalexin; gemfibrozil; glipizide; januvia [sitagliptin]; lipitor [atorvastatin calcium]; losartan; metoprolol; omeprazole; simvastatin; and gabapentin.  MEDICATIONS:    Current Outpatient Medications  Medication Sig Dispense Refill  . carboxymethylcellulose (REFRESH PLUS) 0.5 % SOLN 1 drop 3 (three) times daily as needed.    . docusate sodium (COLACE) 100 MG capsule Take 100 mg by mouth 2 (two) times daily.    Marland Kitchen ezetimibe (ZETIA) 10 MG tablet Take 10 mg by mouth daily.    . Glucose Blood (IGLUCOSE TEST STRIPS VI) by In Vitro route.    . Lancets MISC by Does not apply route.    . metFORMIN (GLUCOPHAGE) 1000 MG tablet Take 1,000 mg by mouth 2 (two) times daily.     No current facility-administered medications for this visit.     REVIEW OF SYSTEMS:   .10 Point review of Systems was done is negative except as noted above.  PHYSICAL EXAMINATION:  Vitals:   07/01/17 1501  BP: 136/72  Pulse: 85  Resp: 20  Temp: 98.5 F (36.9 C)  SpO2: 99%   Filed Weights   07/01/17 1501  Weight: 180 lb 4.8 oz (81.8 kg)    . GENERAL:alert, in no acute distress and comfortable SKIN: no acute rashes, no significant lesions EYES: conjunctiva are pink and non-injected, sclera anicteric OROPHARYNX: MMM, no exudates, no oropharyngeal erythema or ulceration NECK: supple, no JVD LYMPH:  no palpable lymphadenopathy in the cervical, axillary or inguinal regions LUNGS: clear to auscultation b/l with normal respiratory effort HEART: regular rate & rhythm ABDOMEN:  normoactive bowel sounds , non tender, not distended. Extremity: no pedal edema PSYCH: alert & oriented x 3 with fluent speech NEURO: no focal motor/sensory deficits   LABORATORY DATA:  I have reviewed the data as listed  . CBC Latest Ref Rng & Units 06/13/2017  WBC 4.0 - 10.3 K/uL 12.6(H)  Hematocrit 38.4 - 49.9 % 37.4(L)  Platelets 140 - 400 K/uL 143  hgb 13 . CBC    Component Value Date/Time   WBC 12.6 (H) 06/13/2017 1044   RBC 4.25 06/13/2017 1044   RBC 4.25 06/13/2017 1044   HCT 37.4 (L) 06/13/2017 1044   PLT 143 06/13/2017 1044   MCV 88.0 06/13/2017 1044   MCH 30.6 06/13/2017 1044    MCHC 34.8 06/13/2017 1044   RDW 12.6 06/13/2017 1044   LYMPHSABS 9.0 (H) 06/13/2017 1044   MONOABS 0.5 06/13/2017 1044   EOSABS 0.1 06/13/2017 1044   BASOSABS 0.0 06/13/2017 1044   .Marland Kitchen Lab Results  Component Value Date   RETICCTPCT 1.1 06/13/2017   RBC 4.25 06/13/2017   RBC 4.25 06/13/2017    CMP Latest Ref Rng & Units 06/13/2017  Glucose 70 - 140 mg/dL 124  BUN 7 - 26 mg/dL 17  Creatinine 0.70 - 1.30 mg/dL 1.22  Sodium 136 - 145 mmol/L 136  Potassium 3.5 - 5.1 mmol/L 4.3  Chloride 98 - 109 mmol/L 102  CO2 22 - 29 mmol/L 26  Calcium 8.4 - 10.4 mg/dL 9.5  Total  Protein 6.4 - 8.3 g/dL 6.7  Total Bilirubin 0.2 - 1.2 mg/dL 0.4  Alkaline Phos 40 - 150 U/L 93  AST 5 - 34 U/L 11  ALT 0 - 55 U/L 8   . Lab Results  Component Value Date   LDH 130 06/13/2017   Component     Latest Ref Rng & Units 06/13/2017  HCV Ab     0.0 - 0.9 s/co ratio <0.1  Hepatitis B Surface Ag     Negative Negative  Hep B Core Ab, Tot     Negative Negative      Hematopath Consultation Smear 04/26/17    Hematopath Consultation Smear (04/26/17) contd. And CBC from 04/26/17    RADIOGRAPHIC STUDIES: I have personally reviewed the radiological images as listed and agreed with the findings in the report. No results found.  ASSESSMENT & PLAN:  77 y.o. is a male with  1. New diagnosed Chronic lymphocytic leukemia. Likely Rai Stage 0  -he presented with Lymphocytosis incidentally noted on routine labs No associated significant anemia or thrombocytopenia. No constitutional symptoms No overt clinically palpable LNadenopathy or hepato-splenomegaly.  PLAN -we reviewed all his relevant lab results -Pattern of increasing lymphocytosis and PBS suggestive of CLL therefore we will recommend and had a flow cytometry done which was found to be consistent with CLL -Discussed pt labwork today: no anemia; and his recent peripheral blood flow cytometry and the result of CD5 positive indicating Chronic  lymphocytic leukemia likely stage 0 due to normal sized spleen, no swollen lymph nodes, and no anemia or thrombocytopenia. -his new diagnosis of CLL was discussed in details including staging, natural history, prognosis and treatment indications and rationale..  -Advised the pt that we will look continue monitoring him for constitutional symptoms, bone marrow problems, splenomegaly, enlarged lymph glands, and anemia. -Need for CT scan in 2-3 months to get a baseline understanding.  -Recommend pt being up to date on his vaccines; and he's had both pneumonia vaccines and his flu shot.  -Send out FISH prognostic panel for his CLL on next visit. -Discussed transformation events and possible but unlikely autoimmune complications that could develop.   CT chest/abd/pelvis in 10 weeks RTC with Dr Irene Limbo in 12 weeks with labs  All questions were answered. The patient knows to call the clinic with any problems, questions or concerns.  . The total time spent in the appointment was 30 minutes and more than 50% was on counseling and direct patient cares.     This document serves as a record of services personally performed by Sullivan Lone, MD. It was created on his behalf by Baldwin Jamaica, a trained medical scribe. The creation of this record is based on the scribe's personal observations and the provider's statements to them.   .I have reviewed the above documentation for accuracy and completeness, and I agree with the above. Brunetta Genera MD MS

## 2017-07-01 ENCOUNTER — Inpatient Hospital Stay: Payer: Medicare Other | Attending: Hematology | Admitting: Hematology

## 2017-07-01 ENCOUNTER — Telehealth: Payer: Self-pay | Admitting: Hematology

## 2017-07-01 ENCOUNTER — Ambulatory Visit: Payer: Medicare Other | Admitting: Hematology

## 2017-07-01 VITALS — BP 136/72 | HR 85 | Temp 98.5°F | Resp 20 | Ht 70.0 in | Wt 180.3 lb

## 2017-07-01 DIAGNOSIS — K219 Gastro-esophageal reflux disease without esophagitis: Secondary | ICD-10-CM

## 2017-07-01 DIAGNOSIS — E1121 Type 2 diabetes mellitus with diabetic nephropathy: Secondary | ICD-10-CM

## 2017-07-01 DIAGNOSIS — Z7984 Long term (current) use of oral hypoglycemic drugs: Secondary | ICD-10-CM

## 2017-07-01 DIAGNOSIS — R109 Unspecified abdominal pain: Secondary | ICD-10-CM

## 2017-07-01 DIAGNOSIS — R634 Abnormal weight loss: Secondary | ICD-10-CM

## 2017-07-01 DIAGNOSIS — I129 Hypertensive chronic kidney disease with stage 1 through stage 4 chronic kidney disease, or unspecified chronic kidney disease: Secondary | ICD-10-CM

## 2017-07-01 DIAGNOSIS — K589 Irritable bowel syndrome without diarrhea: Secondary | ICD-10-CM

## 2017-07-01 DIAGNOSIS — R5383 Other fatigue: Secondary | ICD-10-CM | POA: Diagnosis not present

## 2017-07-01 DIAGNOSIS — E1140 Type 2 diabetes mellitus with diabetic neuropathy, unspecified: Secondary | ICD-10-CM

## 2017-07-01 DIAGNOSIS — N183 Chronic kidney disease, stage 3 (moderate): Secondary | ICD-10-CM

## 2017-07-01 DIAGNOSIS — I4891 Unspecified atrial fibrillation: Secondary | ICD-10-CM

## 2017-07-01 DIAGNOSIS — C9190 Lymphoid leukemia, unspecified not having achieved remission: Secondary | ICD-10-CM | POA: Diagnosis not present

## 2017-07-01 DIAGNOSIS — R05 Cough: Secondary | ICD-10-CM

## 2017-07-01 DIAGNOSIS — C9110 Chronic lymphocytic leukemia of B-cell type not having achieved remission: Secondary | ICD-10-CM

## 2017-07-01 NOTE — Telephone Encounter (Signed)
Gave avs and calendar for may  °

## 2017-08-06 DIAGNOSIS — I10 Essential (primary) hypertension: Secondary | ICD-10-CM | POA: Diagnosis not present

## 2017-08-06 DIAGNOSIS — R809 Proteinuria, unspecified: Secondary | ICD-10-CM | POA: Diagnosis not present

## 2017-08-06 DIAGNOSIS — K4090 Unilateral inguinal hernia, without obstruction or gangrene, not specified as recurrent: Secondary | ICD-10-CM | POA: Diagnosis not present

## 2017-08-06 DIAGNOSIS — K219 Gastro-esophageal reflux disease without esophagitis: Secondary | ICD-10-CM | POA: Diagnosis not present

## 2017-08-06 DIAGNOSIS — E785 Hyperlipidemia, unspecified: Secondary | ICD-10-CM | POA: Diagnosis not present

## 2017-08-06 DIAGNOSIS — C9190 Lymphoid leukemia, unspecified not having achieved remission: Secondary | ICD-10-CM | POA: Diagnosis not present

## 2017-08-06 DIAGNOSIS — N183 Chronic kidney disease, stage 3 (moderate): Secondary | ICD-10-CM | POA: Diagnosis not present

## 2017-08-06 DIAGNOSIS — E1165 Type 2 diabetes mellitus with hyperglycemia: Secondary | ICD-10-CM | POA: Diagnosis not present

## 2017-08-06 DIAGNOSIS — E1122 Type 2 diabetes mellitus with diabetic chronic kidney disease: Secondary | ICD-10-CM | POA: Diagnosis not present

## 2017-08-06 DIAGNOSIS — Z7984 Long term (current) use of oral hypoglycemic drugs: Secondary | ICD-10-CM | POA: Diagnosis not present

## 2017-08-06 DIAGNOSIS — I2510 Atherosclerotic heart disease of native coronary artery without angina pectoris: Secondary | ICD-10-CM | POA: Diagnosis not present

## 2017-08-08 ENCOUNTER — Telehealth: Payer: Self-pay

## 2017-08-08 NOTE — Telephone Encounter (Signed)
Returned patient call concerning her husband not having a CT appointment. Patient already has the number. Per 3/21 phone message return.

## 2017-08-26 ENCOUNTER — Other Ambulatory Visit: Payer: Self-pay

## 2017-08-26 NOTE — Progress Notes (Signed)
Ct chesty

## 2017-08-27 ENCOUNTER — Other Ambulatory Visit: Payer: Self-pay

## 2017-08-27 ENCOUNTER — Telehealth: Payer: Self-pay

## 2017-08-27 DIAGNOSIS — C9000 Multiple myeloma not having achieved remission: Secondary | ICD-10-CM

## 2017-08-27 DIAGNOSIS — C9110 Chronic lymphocytic leukemia of B-cell type not having achieved remission: Secondary | ICD-10-CM

## 2017-08-27 NOTE — Progress Notes (Signed)
Ct chest

## 2017-08-27 NOTE — Telephone Encounter (Signed)
Request per Tedra Coupe in Radiology for order to be changed to [facility]. Okay to change per MD and order placed. Additionally, ctn needed prior to CT C/A/P. Discussed appt change with pt and agreeable to come in  08/28/17 at 0915 for lab draw at [facility]. Appt note placed to use lab orders expected 09/28/17. Pt aware that lab appt on 5/7 cancelled, but to still come in for f/u with Dr. Irene Limbo.   Provided reinforcement of time to drink contrast and appt times for 4/10. Pt provided readback and verbalized understanding of plan.

## 2017-08-28 ENCOUNTER — Ambulatory Visit (HOSPITAL_COMMUNITY)
Admission: RE | Admit: 2017-08-28 | Discharge: 2017-08-28 | Disposition: A | Discharge status: Home / Self Care | Payer: Medicare Other | Source: Ambulatory Visit | Attending: Hematology | Admitting: Hematology

## 2017-08-28 ENCOUNTER — Inpatient Hospital Stay: Payer: Medicare Other | Attending: Hematology

## 2017-08-28 ENCOUNTER — Ambulatory Visit (HOSPITAL_COMMUNITY): Admission: RE | Admit: 2017-08-28 | Payer: Medicare Other | Source: Ambulatory Visit

## 2017-08-28 DIAGNOSIS — C9190 Lymphoid leukemia, unspecified not having achieved remission: Secondary | ICD-10-CM | POA: Insufficient documentation

## 2017-08-28 DIAGNOSIS — I2510 Atherosclerotic heart disease of native coronary artery without angina pectoris: Secondary | ICD-10-CM | POA: Diagnosis not present

## 2017-08-28 DIAGNOSIS — K7689 Other specified diseases of liver: Secondary | ICD-10-CM | POA: Diagnosis not present

## 2017-08-28 DIAGNOSIS — I700 Atherosclerosis of aorta: Secondary | ICD-10-CM | POA: Insufficient documentation

## 2017-08-28 DIAGNOSIS — R911 Solitary pulmonary nodule: Secondary | ICD-10-CM | POA: Diagnosis not present

## 2017-08-28 DIAGNOSIS — D71 Functional disorders of polymorphonuclear neutrophils: Secondary | ICD-10-CM | POA: Diagnosis not present

## 2017-08-28 DIAGNOSIS — L929 Granulomatous disorder of the skin and subcutaneous tissue, unspecified: Secondary | ICD-10-CM | POA: Diagnosis not present

## 2017-08-28 DIAGNOSIS — C9110 Chronic lymphocytic leukemia of B-cell type not having achieved remission: Secondary | ICD-10-CM

## 2017-08-28 LAB — CMP (CANCER CENTER ONLY)
ALK PHOS: 101 U/L (ref 40–150)
ALT: 10 U/L (ref 0–55)
AST: 10 U/L (ref 5–34)
Albumin: 4.3 g/dL (ref 3.5–5.0)
Anion gap: 11 (ref 3–11)
BUN: 23 mg/dL (ref 7–26)
CALCIUM: 10.2 mg/dL (ref 8.4–10.4)
CO2: 25 mmol/L (ref 22–29)
CREATININE: 1.36 mg/dL — AB (ref 0.70–1.30)
Chloride: 100 mmol/L (ref 98–109)
GFR, EST AFRICAN AMERICAN: 58 mL/min — AB (ref >60)
GFR, Estimated: 50 mL/min — ABNORMAL LOW (ref >60)
Glucose, Bld: 134 mg/dL (ref 70–140)
Potassium: 4.4 mmol/L (ref 3.5–5.1)
SODIUM: 136 mmol/L (ref 136–145)
Total Bilirubin: 0.5 mg/dL (ref 0.2–1.2)
Total Protein: 7.3 g/dL (ref 6.4–8.3)

## 2017-08-28 LAB — RETICULOCYTES
RBC.: 4.56 MIL/uL (ref 4.20–5.82)
RETIC CT PCT: 1 % (ref 0.8–1.8)
Retic Count, Absolute: 45.6 10*3/uL (ref 34.8–93.9)

## 2017-08-28 LAB — CBC WITH DIFFERENTIAL (CANCER CENTER ONLY)
BASOS PCT: 0 %
Basophils Absolute: 0 10*3/uL (ref 0.0–0.1)
EOS ABS: 0 10*3/uL (ref 0.0–0.5)
EOS PCT: 0 %
HCT: 40.5 % (ref 38.4–49.9)
Hemoglobin: 13.9 g/dL (ref 13.0–17.1)
Lymphocytes Relative: 77 %
Lymphs Abs: 12.3 10*3/uL — ABNORMAL HIGH (ref 0.9–3.3)
MCH: 30.5 pg (ref 27.2–33.4)
MCHC: 34.3 g/dL (ref 32.0–36.0)
MCV: 88.8 fL (ref 79.3–98.0)
MONOS PCT: 3 %
Monocytes Absolute: 0.4 10*3/uL (ref 0.1–0.9)
NEUTROS PCT: 20 %
Neutro Abs: 3.1 10*3/uL (ref 1.5–6.5)
PLATELETS: 171 10*3/uL (ref 140–400)
RBC: 4.56 MIL/uL (ref 4.20–5.82)
RDW: 12.9 % (ref 11.0–14.6)
WBC Count: 15.9 10*3/uL — ABNORMAL HIGH (ref 4.0–10.3)

## 2017-08-28 LAB — LACTATE DEHYDROGENASE: LDH: 135 U/L (ref 125–245)

## 2017-08-28 MED ORDER — IOHEXOL 300 MG/ML  SOLN
100.0000 mL | Freq: Once | INTRAMUSCULAR | Status: AC | PRN
  Administered 2017-08-28: 100 mL via INTRAVENOUS

## 2017-09-13 LAB — FISH,CLL PROGNOSTIC PANEL

## 2017-09-24 ENCOUNTER — Other Ambulatory Visit: Payer: Medicare Other

## 2017-09-24 ENCOUNTER — Telehealth: Payer: Self-pay | Admitting: Hematology

## 2017-09-24 ENCOUNTER — Encounter: Payer: Self-pay | Admitting: Hematology

## 2017-09-24 ENCOUNTER — Inpatient Hospital Stay: Payer: Medicare Other | Attending: Hematology | Admitting: Hematology

## 2017-09-24 VITALS — BP 148/86 | HR 74 | Temp 98.6°F | Resp 17 | Ht 70.0 in | Wt 179.0 lb

## 2017-09-24 DIAGNOSIS — I4891 Unspecified atrial fibrillation: Secondary | ICD-10-CM | POA: Diagnosis not present

## 2017-09-24 DIAGNOSIS — I700 Atherosclerosis of aorta: Secondary | ICD-10-CM | POA: Diagnosis not present

## 2017-09-24 DIAGNOSIS — Z79899 Other long term (current) drug therapy: Secondary | ICD-10-CM | POA: Insufficient documentation

## 2017-09-24 DIAGNOSIS — M7989 Other specified soft tissue disorders: Secondary | ICD-10-CM | POA: Diagnosis not present

## 2017-09-24 DIAGNOSIS — N183 Chronic kidney disease, stage 3 (moderate): Secondary | ICD-10-CM | POA: Diagnosis not present

## 2017-09-24 DIAGNOSIS — I10 Essential (primary) hypertension: Secondary | ICD-10-CM | POA: Insufficient documentation

## 2017-09-24 DIAGNOSIS — R05 Cough: Secondary | ICD-10-CM | POA: Insufficient documentation

## 2017-09-24 DIAGNOSIS — K589 Irritable bowel syndrome without diarrhea: Secondary | ICD-10-CM | POA: Insufficient documentation

## 2017-09-24 DIAGNOSIS — R0982 Postnasal drip: Secondary | ICD-10-CM | POA: Diagnosis not present

## 2017-09-24 DIAGNOSIS — R918 Other nonspecific abnormal finding of lung field: Secondary | ICD-10-CM | POA: Diagnosis not present

## 2017-09-24 DIAGNOSIS — Z7984 Long term (current) use of oral hypoglycemic drugs: Secondary | ICD-10-CM | POA: Diagnosis not present

## 2017-09-24 DIAGNOSIS — I129 Hypertensive chronic kidney disease with stage 1 through stage 4 chronic kidney disease, or unspecified chronic kidney disease: Secondary | ICD-10-CM | POA: Insufficient documentation

## 2017-09-24 DIAGNOSIS — K469 Unspecified abdominal hernia without obstruction or gangrene: Secondary | ICD-10-CM | POA: Diagnosis not present

## 2017-09-24 DIAGNOSIS — E119 Type 2 diabetes mellitus without complications: Secondary | ICD-10-CM | POA: Diagnosis not present

## 2017-09-24 DIAGNOSIS — K219 Gastro-esophageal reflux disease without esophagitis: Secondary | ICD-10-CM | POA: Insufficient documentation

## 2017-09-24 DIAGNOSIS — C9110 Chronic lymphocytic leukemia of B-cell type not having achieved remission: Secondary | ICD-10-CM

## 2017-09-24 DIAGNOSIS — E785 Hyperlipidemia, unspecified: Secondary | ICD-10-CM | POA: Diagnosis not present

## 2017-09-24 DIAGNOSIS — C9190 Lymphoid leukemia, unspecified not having achieved remission: Secondary | ICD-10-CM | POA: Diagnosis not present

## 2017-09-24 DIAGNOSIS — R109 Unspecified abdominal pain: Secondary | ICD-10-CM | POA: Diagnosis not present

## 2017-09-24 DIAGNOSIS — R634 Abnormal weight loss: Secondary | ICD-10-CM | POA: Diagnosis not present

## 2017-09-24 NOTE — Telephone Encounter (Signed)
Scheduled appt per 5/7 los -Gave patient AVS and calender per los.  

## 2017-09-24 NOTE — Progress Notes (Signed)
HEMATOLOGY ONCOLOGY CLINIC NOTE  Date of Service:  09/24/2017   Patient Care Team: [name], MD as PCP - General (Family Medicine)  CHIEF COMPLAINTS/PURPOSE OF CONSULTATION:  F/u for CLL  HISTORY OF PRESENTING ILLNESS:   Eric Harrington 77 y.o. male is here because of a referral from Dr. Inda Castle from [place] [facility] regarding a trend in his elevated WBC.   He is accompanied today by his wife of 83 years. The pt reports that he is doing well overall. He recently had a biopsy to check his prostate at [facility] with Dr. Aleen Campi and they took 12 core samples with reportedly no significant findings.  Of note prior to the patient's visit today, pt has had CBC completed on 04/26/17 with results revealing WBC at 14.7, Hgb at 12.6, Lymph Abs at 11.0 with all other values WNL. On 03/26/17 his WBC were 12.8, Hgb at 13.3 and Lymph's at  9.90. On 02/06/17 his WBC were 12.7, Hgb at 13.4, and Lymph's at 8.80.   On review of systems, pt reports post nasal drip, persistent cough, occasional troublesome stomach, pain in his lower abdomen that feels like his muscles are irritated, reports he has lost 25 lbs in the last 2.5 years (he associates a decreased appetite after taking beta blockers during this period) and denies no recent colds or infections, acute changes in energy levels, and leg swelling.   On PMHx the pt reports taking Zetia. He has IB'S and has had 3 colonoscopies with no significant findings or inflammatory processes. He reports that over the last 2.5 years he had heart catheterizations that all turned out well. He reports having diabetes. He also reports neuropathy along his whole right side that lasted for a few months that ceased abruptly.   Interval History:   Eric Harrington returns for follow up of  His CLL. He presents to the clinic today accompanied by his wife. He note he was put on Lisinopril by his PCP but due to cough he stopped the medication 3 weeks ago. He was on this  for 1-2 months before stopping.    On review of symptoms, pt notes he is mostly tired and sleepy. He is still doing what he wants to do. He notes he may be off balance when he first stands up at times. The only pain he has is from his hernia. He denies fever or chills, he notes he has some night sweats but no concerns for it. He discussed his hernia is small and get bothersome when he coughs a lot. This is why he stopped lisinopril and limits his lifting. He note he has IBS and diverticulosis. He notes sometimes he could feel the bowel stick out. He denies any rash.  No new lumps/bumps.  MEDICAL HISTORY:  1. HTN 2. DM2 3. H/o PAC's 4. transient a fib with cardiac cath 5. HLD 6. CKD stage 3 7. Irritable bowel syndrome 8. GERD 9. H/o presyndope  SURGICAL HISTORY:  1. Prostate bx 2. Cardiac cath  SOCIAL HISTORY: Social History   Socioeconomic History  . Marital status: Married    Spouse name: Not on file  . Number of children: Not on file  . Years of education: Not on file  . Highest education level: Not on file  Occupational History  . Not on file  Social Needs  . Financial resource strain: Not on file  . Food insecurity:    Worry: Not on file    Inability: Not on  file  . Transportation needs:    Medical: Not on file    Non-medical: Not on file  Tobacco Use  . Smoking status: Not on file  . Smokeless tobacco: Never Used  Substance and Sexual Activity  . Alcohol use: Not on file  . Drug use: Not on file  . Sexual activity: Not on file  Lifestyle  . Physical activity:    Days per week: Not on file    Minutes per session: Not on file  . Stress: Not on file  Relationships  . Social connections:    Talks on phone: Not on file    Gets together: Not on file    Attends religious service: Not on file    Active member of club or organization: Not on file    Attends meetings of clubs or organizations: Not on file    Relationship status: Not on file  . Intimate partner  violence:    Fear of current or ex partner: Not on file    Emotionally abused: Not on file    Physically abused: Not on file    Forced sexual activity: Not on file  Other Topics Concern  . Not on file  Social History Narrative  . Not on file    FAMILY HISTORY: No family history on file.  ALLERGIES:  is allergic to bystolic [nebivolol hcl]; cephalexin; gemfibrozil; glipizide; januvia [sitagliptin]; lipitor [atorvastatin calcium]; lisinopril; losartan; metoprolol; omeprazole; simvastatin; and gabapentin.  MEDICATIONS:  Current Outpatient Medications  Medication Sig Dispense Refill  . carboxymethylcellulose (REFRESH PLUS) 0.5 % SOLN 1 drop 3 (three) times daily as needed.    . docusate sodium (COLACE) 100 MG capsule Take 100 mg by mouth 2 (two) times daily.    Marland Kitchen ezetimibe (ZETIA) 10 MG tablet Take 10 mg by mouth daily.    . Glucose Blood (IGLUCOSE TEST STRIPS VI) by In Vitro route.    . Lancets MISC by Does not apply route.    . metFORMIN (GLUCOPHAGE) 1000 MG tablet Take 1,000 mg by mouth 2 (two) times daily.     No current facility-administered medications for this visit.     REVIEW OF SYSTEMS:   .10 Point review of Systems was done is negative except as noted above.   PHYSICAL EXAMINATION:  Vitals:   09/24/17 1340  BP: (!) 148/86  Pulse: 74  Resp: 17  Temp: 98.6 F (37 C)  SpO2: 99%   Filed Weights   09/24/17 1340  Weight: 179 lb (81.2 kg)  . GENERAL:alert, in no acute distress and comfortable SKIN: no acute rashes, no significant lesions EYES: conjunctiva are pink and non-injected, sclera anicteric OROPHARYNX: MMM, no exudates, no oropharyngeal erythema or ulceration NECK: supple, no JVD LYMPH:  no palpable lymphadenopathy in the cervical, axillary or inguinal regions LUNGS: clear to auscultation b/l with normal respiratory effort HEART: regular rate & rhythm ABDOMEN:  normoactive bowel sounds , non tender, not distended. Extremity: no pedal edema PSYCH:  alert & oriented x 3 with fluent speech NEURO: no focal motor/sensory deficits   LABORATORY DATA:  I have reviewed the data as listed  . CBC Latest Ref Rng & Units 08/28/2017 06/13/2017  WBC 4.0 - 10.3 K/uL 15.9(H) 12.6(H)  Hemoglobin 13.0 - 17.1 g/dL 13.9 13.0  Hematocrit 38.4 - 49.9 % 40.5 37.4(L)  Platelets 140 - 400 K/uL 171 143    . CBC    Component Value Date/Time   WBC 15.9 (H) 08/28/2017 0937   RBC 4.56 08/28/2017 1610  RBC 4.56 08/28/2017 0937   HGB 13.9 08/28/2017 0937   HCT 40.5 08/28/2017 0937   PLT 171 08/28/2017 0937   MCV 88.8 08/28/2017 0937   MCH 30.5 08/28/2017 0937   MCHC 34.3 08/28/2017 0937   RDW 12.9 08/28/2017 0937   LYMPHSABS 12.3 (H) 08/28/2017 0937   MONOABS 0.4 08/28/2017 0937   EOSABS 0.0 08/28/2017 0937   BASOSABS 0.0 08/28/2017 0937   .Marland Kitchen Lab Results  Component Value Date   RETICCTPCT 1.0 08/28/2017   RBC 4.56 08/28/2017   RBC 4.56 08/28/2017    CMP Latest Ref Rng & Units 08/28/2017 06/13/2017  Glucose 70 - 140 mg/dL 134 124  BUN 7 - 26 mg/dL 23 17  Creatinine 0.70 - 1.30 mg/dL 1.36(H) 1.22  Sodium 136 - 145 mmol/L 136 136  Potassium 3.5 - 5.1 mmol/L 4.4 4.3  Chloride 98 - 109 mmol/L 100 102  CO2 22 - 29 mmol/L 25 26  Calcium 8.4 - 10.4 mg/dL 10.2 9.5  Total Protein 6.4 - 8.3 g/dL 7.3 6.7  Total Bilirubin 0.2 - 1.2 mg/dL 0.5 0.4  Alkaline Phos 40 - 150 U/L 101 93  AST 5 - 34 U/L 10 11  ALT 0 - 55 U/L 10 8   . Lab Results  Component Value Date   LDH 135 08/28/2017   Component     Latest Ref Rng & Units 06/13/2017  HCV Ab     0.0 - 0.9 s/co ratio <0.1  Hepatitis B Surface Ag     Negative Negative  Hep B Core Ab, Tot     Negative Negative         RADIOGRAPHIC STUDIES: I have personally reviewed the radiological images as listed and agreed with the findings in the report. Ct Chest W Contrast  Result Date: 08/28/2017 CLINICAL DATA:  Chronic lymphocytic leukemia. EXAM: CT CHEST, ABDOMEN, AND PELVIS WITH CONTRAST  TECHNIQUE: Multidetector CT imaging of the chest, abdomen and pelvis was performed following the standard protocol during bolus administration of intravenous contrast. CONTRAST:  112mL OMNIPAQUE IOHEXOL 300 MG/ML  SOLN COMPARISON:  None. FINDINGS: CT CHEST FINDINGS Cardiovascular: The heart size appears normal. Mild pericardial thickening versus small effusion noted. Aortic atherosclerosis noted. Calcification in the LAD and left circumflex coronary arteries noted. Mediastinum/Nodes: Normal appearance of the thyroid gland. The trachea appears patent and is midline. Normal appearance of the esophagus. Scattered small subcentimeter mediastinal and hilar lymph nodes identified. No thoracic adenopathy identified. Lungs/Pleura: No pleural effusion. 5 mm right upper lobe lung nodules noted, image 73/6. Scar noted within both lower lobes posteriorly. Calcified granuloma identified within the left upper lobe. Calcified granuloma also noted within the medial right upper lobe. Musculoskeletal: Mild scoliosis and degenerative disc disease noted within the thoracic spine. No suspicious bone lesions. CT ABDOMEN PELVIS FINDINGS Hepatobiliary: A few tiny low-density structures within the dome of liver are identified. Too small to characterize. The gallbladder appears normal. No biliary dilatation. Pancreas: Unremarkable. No pancreatic ductal dilatation or surrounding inflammatory changes. Spleen: Normal in size without focal abnormality. Adrenals/Urinary Tract: Normal adrenal glands. Normal appearance of the kidneys. The urinary bladder appears normal. Stomach/Bowel: The stomach is normal. The small bowel loops have a normal course and caliber. The appendix is visualized and appears normal. Unremarkable appearance of the colon. Vascular/Lymphatic: Aortic atherosclerosis. No aneurysm. No enlarged lymph nodes within the abdomen or pelvis. No inguinal adenopathy. Reproductive: Prostate is unremarkable. Other: No abdominal wall hernia  or abnormality. No abdominopelvic ascites. Musculoskeletal: Spondylosis noted within  the lumbar spine. No aggressive lytic or sclerotic bone lesions. IMPRESSION: 1. No adenopathy identified within the chest abdomen or pelvis. 2. Normal size spleen. 3. Aortic Atherosclerosis (ICD10-I70.0). LAD and left circumflex coronary artery calcification. 4. 5 mm right lung nodule. Nonspecific. No follow-up needed if patient is low-risk. Non-contrast chest CT can be considered in 12 months if patient is high-risk. This recommendation follows the consensus statement: Guidelines for Management of Incidental Pulmonary Nodules Detected on CT Images: From the Fleischner Society 2017; Radiology 2017; 284:228-243. 5. Prior granulomatous disease. Electronically Signed   By: Kerby Moors M.D.   On: 08/28/2017 15:09   Ct Abdomen Pelvis W Contrast  Result Date: 08/28/2017 CLINICAL DATA:  Chronic lymphocytic leukemia. EXAM: CT CHEST, ABDOMEN, AND PELVIS WITH CONTRAST TECHNIQUE: Multidetector CT imaging of the chest, abdomen and pelvis was performed following the standard protocol during bolus administration of intravenous contrast. CONTRAST:  160mL OMNIPAQUE IOHEXOL 300 MG/ML  SOLN COMPARISON:  None. FINDINGS: CT CHEST FINDINGS Cardiovascular: The heart size appears normal. Mild pericardial thickening versus small effusion noted. Aortic atherosclerosis noted. Calcification in the LAD and left circumflex coronary arteries noted. Mediastinum/Nodes: Normal appearance of the thyroid gland. The trachea appears patent and is midline. Normal appearance of the esophagus. Scattered small subcentimeter mediastinal and hilar lymph nodes identified. No thoracic adenopathy identified. Lungs/Pleura: No pleural effusion. 5 mm right upper lobe lung nodules noted, image 73/6. Scar noted within both lower lobes posteriorly. Calcified granuloma identified within the left upper lobe. Calcified granuloma also noted within the medial right upper lobe.  Musculoskeletal: Mild scoliosis and degenerative disc disease noted within the thoracic spine. No suspicious bone lesions. CT ABDOMEN PELVIS FINDINGS Hepatobiliary: A few tiny low-density structures within the dome of liver are identified. Too small to characterize. The gallbladder appears normal. No biliary dilatation. Pancreas: Unremarkable. No pancreatic ductal dilatation or surrounding inflammatory changes. Spleen: Normal in size without focal abnormality. Adrenals/Urinary Tract: Normal adrenal glands. Normal appearance of the kidneys. The urinary bladder appears normal. Stomach/Bowel: The stomach is normal. The small bowel loops have a normal course and caliber. The appendix is visualized and appears normal. Unremarkable appearance of the colon. Vascular/Lymphatic: Aortic atherosclerosis. No aneurysm. No enlarged lymph nodes within the abdomen or pelvis. No inguinal adenopathy. Reproductive: Prostate is unremarkable. Other: No abdominal wall hernia or abnormality. No abdominopelvic ascites. Musculoskeletal: Spondylosis noted within the lumbar spine. No aggressive lytic or sclerotic bone lesions. IMPRESSION: 1. No adenopathy identified within the chest abdomen or pelvis. 2. Normal size spleen. 3. Aortic Atherosclerosis (ICD10-I70.0). LAD and left circumflex coronary artery calcification. 4. 5 mm right lung nodule. Nonspecific. No follow-up needed if patient is low-risk. Non-contrast chest CT can be considered in 12 months if patient is high-risk. This recommendation follows the consensus statement: Guidelines for Management of Incidental Pulmonary Nodules Detected on CT Images: From the Fleischner Society 2017; Radiology 2017; 284:228-243. 5. Prior granulomatous disease. Electronically Signed   By: Kerby Moors M.D.   On: 08/28/2017 15:09    ASSESSMENT & PLAN:  77 y.o. is a male with  1. Recently diagnosed Chronic lymphocytic leukemia. Likely Rai Stage 0  -he presented with Lymphocytosis incidentally  noted on routine labs No associated significant anemia or thrombocytopenia. No constitutional symptoms No overt clinically palpable LNadenopathy or hepato-splenomegaly. 13q mutation present   PLAN  -We reviewed his most recent labs from 08/28/17, elevated WBC at 15.9 and lymphocyte count at 12.3. Cr at 1.36.  -We discussed his CT CAP from 08/28/17 which shows normal  sized spleen, no enlarged lymph nodes. There is evidence of very small lung nodule, likely related to inflammatory process. Will monitor with CT chest in 12 months  -I reviewed his mutation testing of CLL, which shows no concerning mutations presents. 13q mutation is present but is mostly well behaved. -I recommend he stay active to combat his fatigue.  -previously recommended pt being up to date on his vaccines; and he's had both pneumonia vaccines and his flu shot.  -Discussed transformation events are possible but unlikely and discussed monitoring for possible autoimmune complications that could develop (patient understands).    RTC with Dr Irene Limbo in 6 months with labs  All questions were answered. The patient knows to call the clinic with any problems, questions or concerns.  . The total time spent in the appointment was 15 minutes and more than 50% was on counseling and direct patient cares.  This document serves as a record of services personally performed by Sullivan Lone, MD. It was created on his behalf by Joslyn Devon, a trained medical scribe. The creation of this record is based on the scribe's personal observations and the provider's statements to them.    .I have reviewed the above documentation for accuracy and completeness, and I agree with the above.  Brunetta Genera MD MS

## 2017-09-24 NOTE — Patient Instructions (Signed)
Thank you for choosing [facility] to provide your oncology and hematology care.  To afford each patient quality time with our providers, please arrive 30 minutes before your scheduled appointment time.  If you arrive late for your appointment, you may be asked to reschedule.  We strive to give you quality time with our providers, and arriving late affects you and other patients whose appointments are after yours.   If you are a no show for multiple scheduled visits, you may be dismissed from the clinic at the providers discretion.    Again, thank you for choosing [facility], our hope is that these requests will decrease the amount of time that you wait before being seen by our physicians.  ______________________________________________________________________  Should you have questions after your visit to the [facility], please contact our office at [phone] between the hours of 8:30 and 4:30 p.m.    Voicemails left after 4:30p.m will not be returned until the following business day.    For prescription refill requests, please have your pharmacy contact us directly.  Please also try to allow 48 hours for prescription requests.    Please contact the scheduling department for questions regarding scheduling.  For scheduling of procedures such as PET scans, CT scans, MRI, Ultrasound, etc please contact central scheduling at [phone].    Resources For Cancer Patients and Caregivers:   Oncolink.org:  A wonderful resource for patients and healthcare providers for information regarding your disease, ways to tract your treatment, what to expect, etc.     American Cancer Society:  800-227-2345  Can help patients locate various types of support and financial assistance  Cancer Care: 1-800-813-HOPE (4673) Provides financial assistance, online support groups, medication/co-pay assistance.    [place] DSS:  336-641-3447 Where to apply for food  stamps, Medicaid, and utility assistance  Medicare Rights Center: 800-333-4114 Helps people with Medicare understand their rights and benefits, navigate the Medicare system, and secure the quality healthcare they deserve  SCAT: 336-333-6589 [place] [employer]'s shared-ride transportation service for eligible riders who have a disability that prevents them from riding the fixed route bus.    For additional information on assistance programs please contact our social worker:   Grier Hock/Abigail Elmore:  [phone]            

## 2017-10-30 DIAGNOSIS — C9110 Chronic lymphocytic leukemia of B-cell type not having achieved remission: Secondary | ICD-10-CM | POA: Diagnosis not present

## 2017-10-30 DIAGNOSIS — R972 Elevated prostate specific antigen [PSA]: Secondary | ICD-10-CM | POA: Diagnosis not present

## 2017-10-30 DIAGNOSIS — Z87442 Personal history of urinary calculi: Secondary | ICD-10-CM | POA: Diagnosis not present

## 2017-11-05 DIAGNOSIS — E1165 Type 2 diabetes mellitus with hyperglycemia: Secondary | ICD-10-CM | POA: Diagnosis not present

## 2017-11-05 DIAGNOSIS — C9190 Lymphoid leukemia, unspecified not having achieved remission: Secondary | ICD-10-CM | POA: Diagnosis not present

## 2017-11-05 DIAGNOSIS — K219 Gastro-esophageal reflux disease without esophagitis: Secondary | ICD-10-CM | POA: Diagnosis not present

## 2017-11-05 DIAGNOSIS — E785 Hyperlipidemia, unspecified: Secondary | ICD-10-CM | POA: Diagnosis not present

## 2017-11-05 DIAGNOSIS — N182 Chronic kidney disease, stage 2 (mild): Secondary | ICD-10-CM | POA: Diagnosis not present

## 2017-11-05 DIAGNOSIS — K4090 Unilateral inguinal hernia, without obstruction or gangrene, not specified as recurrent: Secondary | ICD-10-CM | POA: Diagnosis not present

## 2017-11-05 DIAGNOSIS — I10 Essential (primary) hypertension: Secondary | ICD-10-CM | POA: Diagnosis not present

## 2017-11-26 DIAGNOSIS — K4090 Unilateral inguinal hernia, without obstruction or gangrene, not specified as recurrent: Secondary | ICD-10-CM | POA: Diagnosis not present

## 2017-12-31 DIAGNOSIS — D176 Benign lipomatous neoplasm of spermatic cord: Secondary | ICD-10-CM | POA: Diagnosis not present

## 2017-12-31 DIAGNOSIS — K4090 Unilateral inguinal hernia, without obstruction or gangrene, not specified as recurrent: Secondary | ICD-10-CM | POA: Diagnosis not present

## 2018-02-18 DIAGNOSIS — H16213 Exposure keratoconjunctivitis, bilateral: Secondary | ICD-10-CM | POA: Diagnosis not present

## 2018-02-18 DIAGNOSIS — H04123 Dry eye syndrome of bilateral lacrimal glands: Secondary | ICD-10-CM | POA: Diagnosis not present

## 2018-02-18 DIAGNOSIS — H35033 Hypertensive retinopathy, bilateral: Secondary | ICD-10-CM | POA: Diagnosis not present

## 2018-03-25 ENCOUNTER — Telehealth: Payer: Self-pay | Admitting: Hematology

## 2018-03-25 NOTE — Telephone Encounter (Signed)
GK out 11/7 - per [facility] moved appointments one month out. Spoke with patient re lab/fu 12/5.

## 2018-03-27 ENCOUNTER — Inpatient Hospital Stay: Payer: Medicare Other | Admitting: Hematology

## 2018-03-27 ENCOUNTER — Inpatient Hospital Stay: Payer: Medicare Other

## 2018-03-27 DIAGNOSIS — E785 Hyperlipidemia, unspecified: Secondary | ICD-10-CM | POA: Diagnosis not present

## 2018-03-27 DIAGNOSIS — C9190 Lymphoid leukemia, unspecified not having achieved remission: Secondary | ICD-10-CM | POA: Diagnosis not present

## 2018-03-27 DIAGNOSIS — I2510 Atherosclerotic heart disease of native coronary artery without angina pectoris: Secondary | ICD-10-CM | POA: Diagnosis not present

## 2018-03-27 DIAGNOSIS — Z789 Other specified health status: Secondary | ICD-10-CM | POA: Diagnosis not present

## 2018-03-27 DIAGNOSIS — Z23 Encounter for immunization: Secondary | ICD-10-CM | POA: Diagnosis not present

## 2018-03-27 DIAGNOSIS — N182 Chronic kidney disease, stage 2 (mild): Secondary | ICD-10-CM | POA: Diagnosis not present

## 2018-03-27 DIAGNOSIS — Z1211 Encounter for screening for malignant neoplasm of colon: Secondary | ICD-10-CM | POA: Diagnosis not present

## 2018-03-27 DIAGNOSIS — I10 Essential (primary) hypertension: Secondary | ICD-10-CM | POA: Diagnosis not present

## 2018-03-27 DIAGNOSIS — E1165 Type 2 diabetes mellitus with hyperglycemia: Secondary | ICD-10-CM | POA: Diagnosis not present

## 2018-04-24 ENCOUNTER — Inpatient Hospital Stay: Payer: Medicare Other | Attending: Hematology

## 2018-04-24 ENCOUNTER — Inpatient Hospital Stay (HOSPITAL_BASED_OUTPATIENT_CLINIC_OR_DEPARTMENT_OTHER): Payer: Medicare Other | Admitting: Hematology

## 2018-04-24 ENCOUNTER — Telehealth: Payer: Self-pay | Admitting: Hematology

## 2018-04-24 ENCOUNTER — Other Ambulatory Visit: Payer: Self-pay | Admitting: Hematology

## 2018-04-24 VITALS — BP 129/74 | HR 85 | Temp 97.7°F | Resp 20 | Ht 70.0 in | Wt 185.3 lb

## 2018-04-24 DIAGNOSIS — Z7984 Long term (current) use of oral hypoglycemic drugs: Secondary | ICD-10-CM | POA: Diagnosis not present

## 2018-04-24 DIAGNOSIS — E119 Type 2 diabetes mellitus without complications: Secondary | ICD-10-CM | POA: Insufficient documentation

## 2018-04-24 DIAGNOSIS — C9110 Chronic lymphocytic leukemia of B-cell type not having achieved remission: Secondary | ICD-10-CM

## 2018-04-24 DIAGNOSIS — K219 Gastro-esophageal reflux disease without esophagitis: Secondary | ICD-10-CM

## 2018-04-24 DIAGNOSIS — R61 Generalized hyperhidrosis: Secondary | ICD-10-CM

## 2018-04-24 DIAGNOSIS — N183 Chronic kidney disease, stage 3 (moderate): Secondary | ICD-10-CM

## 2018-04-24 DIAGNOSIS — K589 Irritable bowel syndrome without diarrhea: Secondary | ICD-10-CM | POA: Diagnosis not present

## 2018-04-24 DIAGNOSIS — E785 Hyperlipidemia, unspecified: Secondary | ICD-10-CM | POA: Diagnosis not present

## 2018-04-24 DIAGNOSIS — Z79899 Other long term (current) drug therapy: Secondary | ICD-10-CM

## 2018-04-24 DIAGNOSIS — I4891 Unspecified atrial fibrillation: Secondary | ICD-10-CM

## 2018-04-24 DIAGNOSIS — I10 Essential (primary) hypertension: Secondary | ICD-10-CM | POA: Insufficient documentation

## 2018-04-24 LAB — RETICULOCYTES
IMMATURE RETIC FRACT: 17.3 % — AB (ref 2.3–15.9)
RBC.: 4.14 MIL/uL — ABNORMAL LOW (ref 4.22–5.81)
RETIC COUNT ABSOLUTE: 56.3 10*3/uL (ref 19.0–186.0)
RETIC CT PCT: 1.4 % (ref 0.4–3.1)

## 2018-04-24 LAB — CBC WITH DIFFERENTIAL/PLATELET
ABS IMMATURE GRANULOCYTES: 0.04 10*3/uL (ref 0.00–0.07)
BASOS PCT: 0 %
Basophils Absolute: 0.1 10*3/uL (ref 0.0–0.1)
Eosinophils Absolute: 0.1 10*3/uL (ref 0.0–0.5)
Eosinophils Relative: 1 %
HCT: 36.7 % — ABNORMAL LOW (ref 39.0–52.0)
Hemoglobin: 12.5 g/dL — ABNORMAL LOW (ref 13.0–17.0)
IMMATURE GRANULOCYTES: 0 %
Lymphocytes Relative: 82 %
Lymphs Abs: 17.2 10*3/uL — ABNORMAL HIGH (ref 0.7–4.0)
MCH: 30.2 pg (ref 26.0–34.0)
MCHC: 34.1 g/dL (ref 30.0–36.0)
MCV: 88.6 fL (ref 80.0–100.0)
MONO ABS: 0.8 10*3/uL (ref 0.1–1.0)
Monocytes Relative: 4 %
NEUTROS PCT: 13 %
Neutro Abs: 2.7 10*3/uL (ref 1.7–7.7)
PLATELETS: 159 10*3/uL (ref 150–400)
RBC: 4.14 MIL/uL — AB (ref 4.22–5.81)
RDW: 12.8 % (ref 11.5–15.5)
WBC: 21 10*3/uL — AB (ref 4.0–10.5)
nRBC: 0 % (ref 0.0–0.2)

## 2018-04-24 LAB — CMP (CANCER CENTER ONLY)
ALT: 12 U/L (ref 0–44)
ANION GAP: 12 (ref 5–15)
AST: 11 U/L — ABNORMAL LOW (ref 15–41)
Albumin: 4.1 g/dL (ref 3.5–5.0)
Alkaline Phosphatase: 103 U/L (ref 38–126)
BILIRUBIN TOTAL: 0.3 mg/dL (ref 0.3–1.2)
BUN: 20 mg/dL (ref 8–23)
CO2: 21 mmol/L — ABNORMAL LOW (ref 22–32)
Calcium: 9.4 mg/dL (ref 8.9–10.3)
Chloride: 104 mmol/L (ref 98–111)
Creatinine: 1.27 mg/dL — ABNORMAL HIGH (ref 0.61–1.24)
GFR, EST NON AFRICAN AMERICAN: 55 mL/min — AB (ref >60)
Glucose, Bld: 161 mg/dL — ABNORMAL HIGH (ref 70–99)
POTASSIUM: 4 mmol/L (ref 3.5–5.1)
Sodium: 137 mmol/L (ref 135–145)
TOTAL PROTEIN: 6.7 g/dL (ref 6.5–8.1)

## 2018-04-24 LAB — LACTATE DEHYDROGENASE: LDH: 147 U/L (ref 98–192)

## 2018-04-24 NOTE — Progress Notes (Signed)
HEMATOLOGY ONCOLOGY CLINIC NOTE  Date of Service:  04/24/2018   Patient Care Team: [name], MD (Inactive) as PCP - General (Family Medicine)  CHIEF COMPLAINTS/PURPOSE OF CONSULTATION:  F/u for CLL  HISTORY OF PRESENTING ILLNESS:   Eric Harrington 77 y.o. male is here because of a referral from Dr. Inda Castle from [place] [facility] regarding a trend in his elevated WBC.   He is accompanied today by his wife of 49 years. The pt reports that he is doing well overall. He recently had a biopsy to check his prostate at [facility] with Dr. Aleen Campi and they took 12 core samples with reportedly no significant findings.  Of note prior to the patient's visit today, pt has had CBC completed on 04/26/17 with results revealing WBC at 14.7, Hgb at 12.6, Lymph Abs at 11.0 with all other values WNL. On 03/26/17 his WBC were 12.8, Hgb at 13.3 and Lymph's at  9.90. On 02/06/17 his WBC were 12.7, Hgb at 13.4, and Lymph's at 8.80.   On review of systems, pt reports post nasal drip, persistent cough, occasional troublesome stomach, pain in his lower abdomen that feels like his muscles are irritated, reports he has lost 25 lbs in the last 2.5 years (he associates a decreased appetite after taking beta blockers during this period) and denies no recent colds or infections, acute changes in energy levels, and leg swelling.   On PMHx the pt reports taking Zetia. He has IB'S and has had 3 colonoscopies with no significant findings or inflammatory processes. He reports that over the last 2.5 years he had heart catheterizations that all turned out well. He reports having diabetes. He also reports neuropathy along his whole right side that lasted for a few months that ceased abruptly.   Interval History:   Eric Harrington returns for management and evaluation of his CLL. The patient's last visit with [place] was on 09/24/17. He is accompanied today by his wife. The pt reports that he is doing well overall.   The pt  reports that he is having more frequent sweats during the day. He also continues to sweat at night occasionally, however he again notes that he has had night sweats for many years. He denies his TSH being checked recently, and denies fluctuating blood sugars. He notes that he was diagnosed with DM about 9-10 years ago. The pt notes that he has had neuropathy in the past.   The pt notes that he had an inguinal hernia surgery in the interim and has recovered successfully from this.   He notes that for a few years his fatigue has slowly increased, and has continued to do so. However, he continues to work every day. He notes that he is able to walk as long as he would like and denies anything in particular that would limit him.  The pt notes that he has not noticed any new lumps or bumps.   The pt notes that he takes stool softeners to keep regular bowel movements. He is now taking 500mg  Metformin BID, down from 1000mg . His las A1C was 6.9, per patient. He has recently begun 10mg  Amlodipine as he has had protein in the urine, per patient. He continues on Zetia as well.   Lab results today (04/24/18) of CBC w/diff, Reticulocytes and CMP is as follows: all values are WNL except for WBC at 21.0k, RBC at 4.14, HGB at 12.5, HCT at 36.7, Lymphs abs at 17.2k, Immature retic fract at 17.3%,  CO2 at 21, Glucose at 161, Creatinine at 1.27, AST at 11, GFR at 55. 04/24/18 LDH is pending   On review of systems, pt reports staying active, eating well, some stable night sweats, and denies fevers, chills, unexpected weight loss, abdominal pains, noticing any new lumps or bumps, mouth sores, new skin rashes, new skin lesions, pain along the spine, leg swelling and any other symptoms.   MEDICAL HISTORY:  1. HTN 2. DM2 3. H/o PAC's 4. transient a fib with cardiac cath 5. HLD 6. CKD stage 3 7. Irritable bowel syndrome 8. GERD 9. H/o presyndope  SURGICAL HISTORY:  1. Prostate bx 2. Cardiac cath  SOCIAL  HISTORY: Social History   Socioeconomic History  . Marital status: Married    Spouse name: Not on file  . Number of children: Not on file  . Years of education: Not on file  . Highest education level: Not on file  Occupational History  . Not on file  Social Needs  . Financial resource strain: Not on file  . Food insecurity:    Worry: Not on file    Inability: Not on file  . Transportation needs:    Medical: Not on file    Non-medical: Not on file  Tobacco Use  . Smoking status: Not on file  . Smokeless tobacco: Never Used  Substance and Sexual Activity  . Alcohol use: Not on file  . Drug use: Not on file  . Sexual activity: Not on file  Lifestyle  . Physical activity:    Days per week: Not on file    Minutes per session: Not on file  . Stress: Not on file  Relationships  . Social connections:    Talks on phone: Not on file    Gets together: Not on file    Attends religious service: Not on file    Active member of club or organization: Not on file    Attends meetings of clubs or organizations: Not on file    Relationship status: Not on file  . Intimate partner violence:    Fear of current or ex partner: Not on file    Emotionally abused: Not on file    Physically abused: Not on file    Forced sexual activity: Not on file  Other Topics Concern  . Not on file  Social History Narrative  . Not on file    FAMILY HISTORY: No family history on file.  ALLERGIES:  is allergic to bystolic [nebivolol hcl]; cephalexin; gemfibrozil; glipizide; januvia [sitagliptin]; lipitor [atorvastatin calcium]; lisinopril; losartan; metoprolol; omeprazole; simvastatin; and gabapentin.  MEDICATIONS:  Current Outpatient Medications  Medication Sig Dispense Refill  . carboxymethylcellulose (REFRESH PLUS) 0.5 % SOLN 1 drop 3 (three) times daily as needed.    . docusate sodium (COLACE) 100 MG capsule Take 100 mg by mouth 2 (two) times daily.    Marland Kitchen ezetimibe (ZETIA) 10 MG tablet Take 10 mg  by mouth daily.    . Glucose Blood (IGLUCOSE TEST STRIPS VI) by In Vitro route.    . Lancets MISC by Does not apply route.    . metFORMIN (GLUCOPHAGE) 1000 MG tablet Take 1,000 mg by mouth 2 (two) times daily.     No current facility-administered medications for this visit.     REVIEW OF SYSTEMS:    A 10+ POINT REVIEW OF SYSTEMS WAS OBTAINED including neurology, dermatology, psychiatry, cardiac, respiratory, lymph, extremities, GI, GU, Musculoskeletal, constitutional, breasts, reproductive, HEENT.  All pertinent positives are noted in  the HPI.  All others are negative.   PHYSICAL EXAMINATION:  Vitals:   04/24/18 1500  BP: 129/74  Pulse: 85  Resp: 20  Temp: 97.7 F (36.5 C)  SpO2: 98%   Filed Weights   04/24/18 1500  Weight: 185 lb 4.8 oz (84.1 kg)  .  GENERAL:alert, in no acute distress and comfortable SKIN: no acute rashes, no significant lesions EYES: conjunctiva are pink and non-injected, sclera anicteric OROPHARYNX: MMM, no exudates, no oropharyngeal erythema or ulceration NECK: supple, no JVD LYMPH:  Minimally palpable sub-centimeter right supraclavicular lymph nodes, no palpable lymphadenopathy in the axillary or inguinal regions LUNGS: clear to auscultation b/l with normal respiratory effort HEART: regular rate & rhythm ABDOMEN:  normoactive bowel sounds , non tender, not distended. No palpable hepatosplenomegaly.  Extremity: no pedal edema PSYCH: alert & oriented x 3 with fluent speech NEURO: no focal motor/sensory deficits   LABORATORY DATA:  I have reviewed the data as listed  . CBC Latest Ref Rng & Units 04/24/2018 08/28/2017 06/13/2017  WBC 4.0 - 10.5 K/uL 21.0(H) 15.9(H) 12.6(H)  Hemoglobin 13.0 - 17.0 g/dL 12.5(L) 13.9 13.0  Hematocrit 39.0 - 52.0 % 36.7(L) 40.5 37.4(L)  Platelets 150 - 400 K/uL 159 171 143    . CBC    Component Value Date/Time   WBC 21.0 (H) 04/24/2018 1447   RBC 4.14 (L) 04/24/2018 1447   RBC 4.14 (L) 04/24/2018 1447   HGB 12.5  (L) 04/24/2018 1447   HGB 13.9 08/28/2017 0937   HCT 36.7 (L) 04/24/2018 1447   PLT 159 04/24/2018 1447   PLT 171 08/28/2017 0937   MCV 88.6 04/24/2018 1447   MCH 30.2 04/24/2018 1447   MCHC 34.1 04/24/2018 1447   RDW 12.8 04/24/2018 1447   LYMPHSABS 17.2 (H) 04/24/2018 1447   MONOABS 0.8 04/24/2018 1447   EOSABS 0.1 04/24/2018 1447   BASOSABS 0.1 04/24/2018 1447   .Marland Kitchen Lab Results  Component Value Date   RETICCTPCT 1.4 04/24/2018   RBC 4.14 (L) 04/24/2018   RBC 4.14 (L) 04/24/2018    CMP Latest Ref Rng & Units 04/24/2018 08/28/2017 06/13/2017  Glucose 70 - 99 mg/dL 161(H) 134 124  BUN 8 - 23 mg/dL 20 23 17   Creatinine 0.61 - 1.24 mg/dL 1.27(H) 1.36(H) 1.22  Sodium 135 - 145 mmol/L 137 136 136  Potassium 3.5 - 5.1 mmol/L 4.0 4.4 4.3  Chloride 98 - 111 mmol/L 104 100 102  CO2 22 - 32 mmol/L 21(L) 25 26  Calcium 8.9 - 10.3 mg/dL 9.4 10.2 9.5  Total Protein 6.5 - 8.1 g/dL 6.7 7.3 6.7  Total Bilirubin 0.3 - 1.2 mg/dL 0.3 0.5 0.4  Alkaline Phos 38 - 126 U/L 103 101 93  AST 15 - 41 U/L 11(L) 10 11  ALT 0 - 44 U/L 12 10 8    . Lab Results  Component Value Date   LDH 147 04/24/2018   Component     Latest Ref Rng & Units 06/13/2017  HCV Ab     0.0 - 0.9 s/co ratio <0.1  Hepatitis B Surface Ag     Negative Negative  Hep B Core Ab, Tot     Negative Negative         RADIOGRAPHIC STUDIES: I have personally reviewed the radiological images as listed and agreed with the findings in the report. No results found.  ASSESSMENT & PLAN:  77 y.o. is a male with  1. Recently diagnosed Chronic lymphocytic leukemia. Likely Rai Stage 0  -  he presented with Lymphocytosis incidentally noted on routine labs No associated significant anemia or thrombocytopenia. No constitutional symptoms No overt clinically palpable LNadenopathy or hepato-splenomegaly. 13q mutation present   08/28/17 CT C/A/P revealed Normal sized spleen, no enlarged lymph nodes. There is evidence of very small lung  nodule, likely related to inflammatory process. Will monitor with CT chest in 12 months   PLAN:  -Discussed pt labwork today, 04/24/18; some mild anemia with HGB at 12.5, Lymphs increased from 12.3k seven months ago to 17.2k today. Blood chemistries are stable.  -04/24/18 LDH is . Lab Results  Component Value Date   LDH 147 04/24/2018    -Recommended that the pt begin taking Vitamin B complex, as he is taking Metformin -Discussed the criteria for considering initiating treatment including threat of organ injury, cytopenias, and bothersome constitutional symptoms -Difficult to interpret if the patient's night sweats are related to CLL as he reports having these for many years, and I discussed a steroid course as a possible treatment to evaluate this. He denies wanting to do this, which is reasonable.  -Will evaluate TSH as well  -Discussed that the pt is at higher risk for non-melanoma skin cancers with his specific mutation of CLL -Recommend continued skin screening with dermatology, the pt notes he will in February  -I recommend he stay active to help witt his fatigue.  -previously recommended pt being up to date on his vaccines; and he's had both pneumonia vaccines and his flu shot.  -Will see the pt back in 4 months    RTC with Dr Irene Limbo in 4 months with labs   All questions were answered. The patient knows to call the clinic with any problems, questions or concerns.  The total time spent in the appt was 20 minutes and more than 50% was on counseling and direct patient cares.   Sullivan Lone MD [place] AAHIVMS [facility] Hematology/Oncology Physician [facility]  (Office):       [phone] (Work cell):  [phone] (Fax):           [phone]  I, Baldwin Jamaica, am acting as a scribe for Dr. Sullivan Lone.   .I have reviewed the above documentation for accuracy and completeness, and I agree with the above. Brunetta Genera MD

## 2018-04-24 NOTE — Telephone Encounter (Signed)
Printed calendar and avs. °

## 2018-04-25 LAB — TSH: TSH: 0.562 u[IU]/mL (ref 0.320–4.118)

## 2018-05-27 ENCOUNTER — Telehealth: Payer: Self-pay | Admitting: Hematology

## 2018-05-27 NOTE — Telephone Encounter (Signed)
Called patient per 12/31 scheduled voicemail, to confirm his appointments in April.

## 2018-06-04 DIAGNOSIS — J40 Bronchitis, not specified as acute or chronic: Secondary | ICD-10-CM | POA: Diagnosis not present

## 2018-06-10 DIAGNOSIS — Z0130 Encounter for examination of blood pressure without abnormal findings: Secondary | ICD-10-CM | POA: Diagnosis not present

## 2018-06-10 DIAGNOSIS — Z7984 Long term (current) use of oral hypoglycemic drugs: Secondary | ICD-10-CM | POA: Diagnosis not present

## 2018-06-10 DIAGNOSIS — E1165 Type 2 diabetes mellitus with hyperglycemia: Secondary | ICD-10-CM | POA: Diagnosis not present

## 2018-06-10 DIAGNOSIS — D72828 Other elevated white blood cell count: Secondary | ICD-10-CM | POA: Diagnosis not present

## 2018-06-26 DIAGNOSIS — L720 Epidermal cyst: Secondary | ICD-10-CM | POA: Diagnosis not present

## 2018-06-26 DIAGNOSIS — D1801 Hemangioma of skin and subcutaneous tissue: Secondary | ICD-10-CM | POA: Diagnosis not present

## 2018-06-26 DIAGNOSIS — L578 Other skin changes due to chronic exposure to nonionizing radiation: Secondary | ICD-10-CM | POA: Diagnosis not present

## 2018-06-26 DIAGNOSIS — I781 Nevus, non-neoplastic: Secondary | ICD-10-CM | POA: Diagnosis not present

## 2018-06-26 DIAGNOSIS — D3610 Benign neoplasm of peripheral nerves and autonomic nervous system, unspecified: Secondary | ICD-10-CM | POA: Diagnosis not present

## 2018-06-26 DIAGNOSIS — L821 Other seborrheic keratosis: Secondary | ICD-10-CM | POA: Diagnosis not present

## 2018-06-26 DIAGNOSIS — L570 Actinic keratosis: Secondary | ICD-10-CM | POA: Diagnosis not present

## 2018-06-27 DIAGNOSIS — E785 Hyperlipidemia, unspecified: Secondary | ICD-10-CM | POA: Diagnosis not present

## 2018-06-27 DIAGNOSIS — C9190 Lymphoid leukemia, unspecified not having achieved remission: Secondary | ICD-10-CM | POA: Diagnosis not present

## 2018-06-27 DIAGNOSIS — I10 Essential (primary) hypertension: Secondary | ICD-10-CM | POA: Diagnosis not present

## 2018-06-27 DIAGNOSIS — E1122 Type 2 diabetes mellitus with diabetic chronic kidney disease: Secondary | ICD-10-CM | POA: Diagnosis not present

## 2018-07-21 DIAGNOSIS — E1122 Type 2 diabetes mellitus with diabetic chronic kidney disease: Secondary | ICD-10-CM | POA: Diagnosis not present

## 2018-07-21 DIAGNOSIS — E7800 Pure hypercholesterolemia, unspecified: Secondary | ICD-10-CM | POA: Diagnosis not present

## 2018-07-21 DIAGNOSIS — Z0000 Encounter for general adult medical examination without abnormal findings: Secondary | ICD-10-CM | POA: Diagnosis not present

## 2018-07-21 DIAGNOSIS — Z7984 Long term (current) use of oral hypoglycemic drugs: Secondary | ICD-10-CM | POA: Diagnosis not present

## 2018-07-21 DIAGNOSIS — C9190 Lymphoid leukemia, unspecified not having achieved remission: Secondary | ICD-10-CM | POA: Diagnosis not present

## 2018-07-21 DIAGNOSIS — Z1389 Encounter for screening for other disorder: Secondary | ICD-10-CM | POA: Diagnosis not present

## 2018-07-21 DIAGNOSIS — I10 Essential (primary) hypertension: Secondary | ICD-10-CM | POA: Diagnosis not present

## 2018-08-22 ENCOUNTER — Telehealth: Payer: Self-pay | Admitting: *Deleted

## 2018-08-22 NOTE — Telephone Encounter (Signed)
Contacted patient per [facility] current rescheduling guidelines.  Dr.Kale would like to reschedule appts on 4/6 and reschedule for June Informed patient that scheduling will contact them. Patient verbalized understanding.  Schedule msg sent.

## 2018-08-25 ENCOUNTER — Inpatient Hospital Stay: Payer: Medicare Other

## 2018-08-25 ENCOUNTER — Inpatient Hospital Stay: Payer: Medicare Other | Admitting: Hematology

## 2018-08-27 ENCOUNTER — Telehealth: Payer: Self-pay | Admitting: Hematology

## 2018-08-27 NOTE — Telephone Encounter (Signed)
Scheduled appt per 4/3 sch message - pt aware of new appt date and time

## 2018-09-25 DIAGNOSIS — C9190 Lymphoid leukemia, unspecified not having achieved remission: Secondary | ICD-10-CM | POA: Diagnosis not present

## 2018-09-25 DIAGNOSIS — I2510 Atherosclerotic heart disease of native coronary artery without angina pectoris: Secondary | ICD-10-CM | POA: Diagnosis not present

## 2018-09-25 DIAGNOSIS — E1122 Type 2 diabetes mellitus with diabetic chronic kidney disease: Secondary | ICD-10-CM | POA: Diagnosis not present

## 2018-09-25 DIAGNOSIS — Z7984 Long term (current) use of oral hypoglycemic drugs: Secondary | ICD-10-CM | POA: Diagnosis not present

## 2018-09-25 DIAGNOSIS — I10 Essential (primary) hypertension: Secondary | ICD-10-CM | POA: Diagnosis not present

## 2018-09-25 DIAGNOSIS — E785 Hyperlipidemia, unspecified: Secondary | ICD-10-CM | POA: Diagnosis not present

## 2018-10-10 DIAGNOSIS — C9190 Lymphoid leukemia, unspecified not having achieved remission: Secondary | ICD-10-CM | POA: Diagnosis not present

## 2018-10-10 DIAGNOSIS — E785 Hyperlipidemia, unspecified: Secondary | ICD-10-CM | POA: Diagnosis not present

## 2018-10-10 DIAGNOSIS — Z7984 Long term (current) use of oral hypoglycemic drugs: Secondary | ICD-10-CM | POA: Diagnosis not present

## 2018-10-10 DIAGNOSIS — E1122 Type 2 diabetes mellitus with diabetic chronic kidney disease: Secondary | ICD-10-CM | POA: Diagnosis not present

## 2018-10-24 NOTE — Progress Notes (Signed)
HEMATOLOGY ONCOLOGY CLINIC NOTE  Date of Service:  10/27/2018   Patient Care Team: [name], MD (Inactive) as PCP - General (Family Medicine)  CHIEF COMPLAINTS/PURPOSE OF CONSULTATION:  F/u for CLL  HISTORY OF PRESENTING ILLNESS:   Eric Harrington 78 y.o. male is here because of a referral from Dr. Inda Castle from [place] [facility] regarding a trend in his elevated WBC.   He is accompanied today by his wife of 55 years. The pt reports that he is doing well overall. He recently had a biopsy to check his prostate at [facility] with Dr. Aleen Campi and they took 12 core samples with reportedly no significant findings.  Of note prior to the patient's visit today, pt has had CBC completed on 04/26/17 with results revealing WBC at 14.7, Hgb at 12.6, Lymph Abs at 11.0 with all other values WNL. On 03/26/17 his WBC were 12.8, Hgb at 13.3 and Lymph's at  9.90. On 02/06/17 his WBC were 12.7, Hgb at 13.4, and Lymph's at 8.80.   On review of systems, pt reports post nasal drip, persistent cough, occasional troublesome stomach, pain in his lower abdomen that feels like his muscles are irritated, reports he has lost 25 lbs in the last 2.5 years (he associates a decreased appetite after taking beta blockers during this period) and denies no recent colds or infections, acute changes in energy levels, and leg swelling.   On PMHx the pt reports taking Zetia. He has IB'S and has had 3 colonoscopies with no significant findings or inflammatory processes. He reports that over the last 2.5 years he had heart catheterizations that all turned out well. He reports having diabetes. He also reports neuropathy along his whole right side that lasted for a few months that ceased abruptly.   Interval History:   Caine Barfield returns for management and evaluation of his CLL. The patient's last visit with [place] was on 04/24/18. The pt reports that he is doing well overall.  The pt reports that he has been following up  with his PCP regarding his recently increased creatinine. He also notes that he consumes about 32oz of coffee each day and a couple iced teas, and 4 x 18oz bottles of water. He notes that his glucose has been relatively higher recently for the last month.   The pt denies fevers or chills and notes that he has not had any night sweats. He denies staying very active in the interim, for no particular reason. He denies any concerns for infections.  Lab results today (10/27/18) of CBC w/diff and CMP is as follows: all values are WNL except for WBC at 24.2k, HGB at 12.9, Lymphs abs at 20.2k, Glucose at 151, Creatinine at 1.57, AST at 14, GFR at 42.  On review of systems, pt reports stable energy levels, stable weight, resolved sweating at night, and denies fevers, chills, night sweats, concerns for infections, and any other symptoms.   MEDICAL HISTORY:  1. HTN 2. DM2 3. H/o PAC's 4. transient a fib with cardiac cath 5. HLD 6. CKD stage 3 7. Irritable bowel syndrome 8. GERD 9. H/o presyndope  SURGICAL HISTORY:  1. Prostate bx 2. Cardiac cath  SOCIAL HISTORY: Social History   Socioeconomic History  . Marital status: Married    Spouse name: Not on file  . Number of children: Not on file  . Years of education: Not on file  . Highest education level: Not on file  Occupational History  . Not on  file  Social Needs  . Financial resource strain: Not on file  . Food insecurity:    Worry: Not on file    Inability: Not on file  . Transportation needs:    Medical: Not on file    Non-medical: Not on file  Tobacco Use  . Smoking status: Not on file  . Smokeless tobacco: Never Used  Substance and Sexual Activity  . Alcohol use: Not on file  . Drug use: Not on file  . Sexual activity: Not on file  Lifestyle  . Physical activity:    Days per week: Not on file    Minutes per session: Not on file  . Stress: Not on file  Relationships  . Social connections:    Talks on phone: Not on file     Gets together: Not on file    Attends religious service: Not on file    Active member of club or organization: Not on file    Attends meetings of clubs or organizations: Not on file    Relationship status: Not on file  . Intimate partner violence:    Fear of current or ex partner: Not on file    Emotionally abused: Not on file    Physically abused: Not on file    Forced sexual activity: Not on file  Other Topics Concern  . Not on file  Social History Narrative  . Not on file    FAMILY HISTORY: No family history on file.  ALLERGIES:  is allergic to bystolic [nebivolol hcl]; cephalexin; gemfibrozil; glipizide; januvia [sitagliptin]; lipitor [atorvastatin calcium]; lisinopril; losartan; metoprolol; omeprazole; simvastatin; and gabapentin.  MEDICATIONS:  Current Outpatient Medications  Medication Sig Dispense Refill  . amLODipine (NORVASC) 10 MG tablet Take 10 mg by mouth daily.    Marland Kitchen aspirin EC 81 MG tablet Take 81 mg by mouth daily.    . carboxymethylcellulose (REFRESH PLUS) 0.5 % SOLN 1 drop 3 (three) times daily as needed.    . docusate sodium (COLACE) 100 MG capsule Take 100 mg by mouth 2 (two) times daily.    Marland Kitchen ezetimibe (ZETIA) 10 MG tablet Take 10 mg by mouth daily.    . Glucose Blood (IGLUCOSE TEST STRIPS VI) by In Vitro route.    . Lancets MISC by Does not apply route.    . metFORMIN (GLUCOPHAGE) 1000 MG tablet Take 500 mg by mouth 2 (two) times daily.      No current facility-administered medications for this visit.     REVIEW OF SYSTEMS:    A 10+ POINT REVIEW OF SYSTEMS WAS OBTAINED including neurology, dermatology, psychiatry, cardiac, respiratory, lymph, extremities, GI, GU, Musculoskeletal, constitutional, breasts, reproductive, HEENT.  All pertinent positives are noted in the HPI.  All others are negative.   PHYSICAL EXAMINATION:  Vitals:   10/27/18 0903  BP: 102/64  Pulse: 72  Resp: 17  Temp: 98.5 F (36.9 C)  SpO2: 99%   Filed Weights   10/27/18  0903  Weight: 186 lb 11.2 oz (84.7 kg)  .  GENERAL:alert, in no acute distress and comfortable SKIN: no acute rashes, no significant lesions EYES: conjunctiva are pink and non-injected, sclera anicteric OROPHARYNX: MMM, no exudates, no oropharyngeal erythema or ulceration NECK: supple, no JVD LYMPH: Minimally palpable sub-centimeter right supraclavicular lymph nodes, no palpable lymphadenopathy in the axillary or inguinal regions LUNGS: clear to auscultation b/l with normal respiratory effort HEART: regular rate & rhythm ABDOMEN:  normoactive bowel sounds , non tender, not distended. No palpable hepatosplenomegaly.  Extremity: no pedal edema PSYCH: alert & oriented x 3 with fluent speech NEURO: no focal motor/sensory deficits   LABORATORY DATA:  I have reviewed the data as listed  . CBC Latest Ref Rng & Units 10/27/2018 04/24/2018 08/28/2017  WBC 4.0 - 10.5 K/uL 24.2(H) 21.0(H) 15.9(H)  Hemoglobin 13.0 - 17.0 g/dL 12.9(L) 12.5(L) 13.9  Hematocrit 39.0 - 52.0 % 39.1 36.7(L) 40.5  Platelets 150 - 400 K/uL 175 159 171    . CBC    Component Value Date/Time   WBC 24.2 (H) 10/27/2018 0846   RBC 4.25 10/27/2018 0846   HGB 12.9 (L) 10/27/2018 0846   HGB 13.9 08/28/2017 0937   HCT 39.1 10/27/2018 0846   PLT 175 10/27/2018 0846   PLT 171 08/28/2017 0937   MCV 92.0 10/27/2018 0846   MCH 30.4 10/27/2018 0846   MCHC 33.0 10/27/2018 0846   RDW 12.6 10/27/2018 0846   LYMPHSABS 20.2 (H) 10/27/2018 0846   MONOABS 0.7 10/27/2018 0846   EOSABS 0.1 10/27/2018 0846   BASOSABS 0.1 10/27/2018 0846   .Marland Kitchen Lab Results  Component Value Date   RETICCTPCT 1.4 04/24/2018   RBC 4.25 10/27/2018    CMP Latest Ref Rng & Units 10/27/2018 04/24/2018 08/28/2017  Glucose 70 - 99 mg/dL 151(H) 161(H) 134  BUN 8 - 23 mg/dL 19 20 23   Creatinine 0.61 - 1.24 mg/dL 1.57(H) 1.27(H) 1.36(H)  Sodium 135 - 145 mmol/L 136 137 136  Potassium 3.5 - 5.1 mmol/L 4.5 4.0 4.4  Chloride 98 - 111 mmol/L 103 104 100  CO2 22  - 32 mmol/L 22 21(L) 25  Calcium 8.9 - 10.3 mg/dL 9.3 9.4 10.2  Total Protein 6.5 - 8.1 g/dL 7.0 6.7 7.3  Total Bilirubin 0.3 - 1.2 mg/dL 0.4 0.3 0.5  Alkaline Phos 38 - 126 U/L 95 103 101  AST 15 - 41 U/L 14(L) 11(L) 10  ALT 0 - 44 U/L 17 12 10    . Lab Results  Component Value Date   LDH 147 04/24/2018   Component     Latest Ref Rng & Units 06/13/2017  HCV Ab     0.0 - 0.9 s/co ratio <0.1  Hepatitis B Surface Ag     Negative Negative  Hep B Core Ab, Tot     Negative Negative         RADIOGRAPHIC STUDIES: I have personally reviewed the radiological images as listed and agreed with the findings in the report. No results found.  ASSESSMENT & PLAN:   78 y.o. is a male with  1. Recently diagnosed Chronic lymphocytic leukemia. Likely Rai Stage 0  -he presented with Lymphocytosis incidentally noted on routine labs No associated significant anemia or thrombocytopenia. No constitutional symptoms No overt clinically palpable LNadenopathy or hepato-splenomegaly. 13q mutation present   08/28/17 CT C/A/P revealed Normal sized spleen, no enlarged lymph nodes. There is evidence of very small lung nodule, likely related to inflammatory process. Will monitor with CT chest in 12 months   PLAN:  -Discussed pt labwork today, 10/27/18; WBC increased from 21.0 six months ago to 24.2k today, with Lymphocytes at 20.2k. HGB improved to 12.9. Creatinine at 1.57 -The pt shows no over clinical or lab progression of his CLL at this time.  -No indication to initiate treatment at this time.  -Discussed again the indications to consider initiating treatment including cytopenias, threat of organ injury, or constitutional symptoms -Continue follow up with PCP for management and evaluation of DM and kidney function -Continue Vitamin  B complex, as he is taking Metformin -Will evaluate TSH as well  -Discussed that the pt is at higher risk for non-melanoma skin cancers with his specific mutation of CLL  -Recommend continued skin screening with dermatology given the risk of non melanoma skin cancers, the pt notes he will do this -Recommended that the pt continue to eat well, drink at least 48-64 oz of water each day, and walk 20-30 minutes each day. -previously recommended pt being up to date on his vaccines; and he's had both pneumonia vaccines and his flu shot.  -Will see the pt back in 6 months, sooner if any new concerns   RTC with Dr Irene Limbo with labs in 6 months   All questions were answered. The patient knows to call the clinic with any problems, questions or concerns.  The total time spent in the appt was 20 minutes and more than 50% was on counseling and direct patient cares.   Sullivan Lone MD [place] AAHIVMS [facility] Hematology/Oncology Physician [facility]  (Office):       [phone] (Work cell):  [phone] (Fax):           [phone]  I, Baldwin Jamaica, am acting as a scribe for Dr. Sullivan Lone.   .I have reviewed the above documentation for accuracy and completeness, and I agree with the above. Brunetta Genera MD

## 2018-10-27 ENCOUNTER — Inpatient Hospital Stay (HOSPITAL_BASED_OUTPATIENT_CLINIC_OR_DEPARTMENT_OTHER): Payer: Medicare Other | Admitting: Hematology

## 2018-10-27 ENCOUNTER — Inpatient Hospital Stay: Payer: Medicare Other | Attending: Hematology

## 2018-10-27 ENCOUNTER — Telehealth: Payer: Self-pay | Admitting: Hematology

## 2018-10-27 ENCOUNTER — Other Ambulatory Visit: Payer: Self-pay

## 2018-10-27 VITALS — BP 102/64 | HR 72 | Temp 98.5°F | Resp 17 | Ht 70.0 in | Wt 186.7 lb

## 2018-10-27 DIAGNOSIS — K219 Gastro-esophageal reflux disease without esophagitis: Secondary | ICD-10-CM

## 2018-10-27 DIAGNOSIS — Z7982 Long term (current) use of aspirin: Secondary | ICD-10-CM | POA: Insufficient documentation

## 2018-10-27 DIAGNOSIS — R05 Cough: Secondary | ICD-10-CM | POA: Diagnosis not present

## 2018-10-27 DIAGNOSIS — K589 Irritable bowel syndrome without diarrhea: Secondary | ICD-10-CM

## 2018-10-27 DIAGNOSIS — Z79899 Other long term (current) drug therapy: Secondary | ICD-10-CM

## 2018-10-27 DIAGNOSIS — I129 Hypertensive chronic kidney disease with stage 1 through stage 4 chronic kidney disease, or unspecified chronic kidney disease: Secondary | ICD-10-CM

## 2018-10-27 DIAGNOSIS — N183 Chronic kidney disease, stage 3 (moderate): Secondary | ICD-10-CM | POA: Insufficient documentation

## 2018-10-27 DIAGNOSIS — R0982 Postnasal drip: Secondary | ICD-10-CM | POA: Insufficient documentation

## 2018-10-27 DIAGNOSIS — I4891 Unspecified atrial fibrillation: Secondary | ICD-10-CM | POA: Insufficient documentation

## 2018-10-27 DIAGNOSIS — E119 Type 2 diabetes mellitus without complications: Secondary | ICD-10-CM

## 2018-10-27 DIAGNOSIS — Z7984 Long term (current) use of oral hypoglycemic drugs: Secondary | ICD-10-CM

## 2018-10-27 DIAGNOSIS — R61 Generalized hyperhidrosis: Secondary | ICD-10-CM

## 2018-10-27 DIAGNOSIS — C9110 Chronic lymphocytic leukemia of B-cell type not having achieved remission: Secondary | ICD-10-CM | POA: Insufficient documentation

## 2018-10-27 DIAGNOSIS — G629 Polyneuropathy, unspecified: Secondary | ICD-10-CM | POA: Insufficient documentation

## 2018-10-27 LAB — CMP (CANCER CENTER ONLY)
ALT: 17 U/L (ref 0–44)
AST: 14 U/L — ABNORMAL LOW (ref 15–41)
Albumin: 4.4 g/dL (ref 3.5–5.0)
Alkaline Phosphatase: 95 U/L (ref 38–126)
Anion gap: 11 (ref 5–15)
BUN: 19 mg/dL (ref 8–23)
CO2: 22 mmol/L (ref 22–32)
Calcium: 9.3 mg/dL (ref 8.9–10.3)
Chloride: 103 mmol/L (ref 98–111)
Creatinine: 1.57 mg/dL — ABNORMAL HIGH (ref 0.61–1.24)
GFR, Est AFR Am: 49 mL/min — ABNORMAL LOW (ref >60)
GFR, Estimated: 42 mL/min — ABNORMAL LOW (ref >60)
Glucose, Bld: 151 mg/dL — ABNORMAL HIGH (ref 70–99)
Potassium: 4.5 mmol/L (ref 3.5–5.1)
Sodium: 136 mmol/L (ref 135–145)
Total Bilirubin: 0.4 mg/dL (ref 0.3–1.2)
Total Protein: 7 g/dL (ref 6.5–8.1)

## 2018-10-27 LAB — CBC WITH DIFFERENTIAL/PLATELET
Abs Immature Granulocytes: 0.03 10*3/uL (ref 0.00–0.07)
Basophils Absolute: 0.1 10*3/uL (ref 0.0–0.1)
Basophils Relative: 0 %
Eosinophils Absolute: 0.1 10*3/uL (ref 0.0–0.5)
Eosinophils Relative: 0 %
HCT: 39.1 % (ref 39.0–52.0)
Hemoglobin: 12.9 g/dL — ABNORMAL LOW (ref 13.0–17.0)
Immature Granulocytes: 0 %
Lymphocytes Relative: 84 %
Lymphs Abs: 20.2 10*3/uL — ABNORMAL HIGH (ref 0.7–4.0)
MCH: 30.4 pg (ref 26.0–34.0)
MCHC: 33 g/dL (ref 30.0–36.0)
MCV: 92 fL (ref 80.0–100.0)
Monocytes Absolute: 0.7 10*3/uL (ref 0.1–1.0)
Monocytes Relative: 3 %
Neutro Abs: 3.1 10*3/uL (ref 1.7–7.7)
Neutrophils Relative %: 13 %
Platelets: 175 10*3/uL (ref 150–400)
RBC: 4.25 MIL/uL (ref 4.22–5.81)
RDW: 12.6 % (ref 11.5–15.5)
WBC: 24.2 10*3/uL — ABNORMAL HIGH (ref 4.0–10.5)
nRBC: 0 % (ref 0.0–0.2)

## 2018-10-27 LAB — TSH: TSH: 1.109 u[IU]/mL (ref 0.320–4.118)

## 2018-10-27 NOTE — Telephone Encounter (Signed)
Scheduled appt per 6/8 los. A calendar will be mailed out. °

## 2018-11-06 DIAGNOSIS — R972 Elevated prostate specific antigen [PSA]: Secondary | ICD-10-CM | POA: Diagnosis not present

## 2018-11-13 DIAGNOSIS — Z7984 Long term (current) use of oral hypoglycemic drugs: Secondary | ICD-10-CM | POA: Diagnosis not present

## 2018-11-13 DIAGNOSIS — E1122 Type 2 diabetes mellitus with diabetic chronic kidney disease: Secondary | ICD-10-CM | POA: Diagnosis not present

## 2018-12-01 DIAGNOSIS — R002 Palpitations: Secondary | ICD-10-CM | POA: Diagnosis not present

## 2018-12-01 DIAGNOSIS — I10 Essential (primary) hypertension: Secondary | ICD-10-CM | POA: Diagnosis not present

## 2018-12-01 DIAGNOSIS — N183 Chronic kidney disease, stage 3 (moderate): Secondary | ICD-10-CM | POA: Diagnosis not present

## 2018-12-01 DIAGNOSIS — R5383 Other fatigue: Secondary | ICD-10-CM | POA: Diagnosis not present

## 2018-12-02 ENCOUNTER — Other Ambulatory Visit: Payer: Self-pay

## 2018-12-02 ENCOUNTER — Encounter: Payer: Self-pay | Admitting: Cardiology

## 2018-12-02 ENCOUNTER — Ambulatory Visit: Payer: Medicare Other

## 2018-12-02 ENCOUNTER — Ambulatory Visit (INDEPENDENT_AMBULATORY_CARE_PROVIDER_SITE_OTHER): Payer: Medicare Other | Admitting: Cardiology

## 2018-12-02 VITALS — BP 157/84 | HR 80 | Ht 70.0 in | Wt 188.7 lb

## 2018-12-02 DIAGNOSIS — R55 Syncope and collapse: Secondary | ICD-10-CM

## 2018-12-02 DIAGNOSIS — E1169 Type 2 diabetes mellitus with other specified complication: Secondary | ICD-10-CM | POA: Insufficient documentation

## 2018-12-02 DIAGNOSIS — I951 Orthostatic hypotension: Secondary | ICD-10-CM

## 2018-12-02 DIAGNOSIS — I10 Essential (primary) hypertension: Secondary | ICD-10-CM | POA: Diagnosis not present

## 2018-12-02 DIAGNOSIS — E118 Type 2 diabetes mellitus with unspecified complications: Secondary | ICD-10-CM | POA: Diagnosis not present

## 2018-12-02 DIAGNOSIS — E7800 Pure hypercholesterolemia, unspecified: Secondary | ICD-10-CM

## 2018-12-02 DIAGNOSIS — E785 Hyperlipidemia, unspecified: Secondary | ICD-10-CM

## 2018-12-02 HISTORY — DX: Hyperlipidemia, unspecified: E78.5

## 2018-12-02 HISTORY — DX: Type 2 diabetes mellitus with unspecified complications: E11.8

## 2018-12-02 NOTE — Progress Notes (Signed)
Primary Physician/Referring:  [name], MD (Inactive)  Patient ID: Eric Harrington, male    DOB: 1941/04/09, 78 y.o.   MRN: [id number]  Subjective   Chief Complaint  Patient presents with  . Palpitations  . Fatigue  . Dizziness  . New Patient (Initial Visit)    HPI: Eric Harrington  is a 78 y.o. Caucasian male with longstanding history of diabetes, hyperlipidemia, hypertension, referred to me for evaluation of palpitations and near syncopal episodes.  Last episode was about 2 weeks ago and again a week ago, while he was working in the shop, he suddenly felt like he would pass out but he did not pass out.  He was noted to have very low blood pressure.  Blood sugar was fine.   Due to recurrent episodes over the past 2 to 3 years, and recently has gotten worse to the point where he has had between 2 and 4 episodes a month he was referred to me on an urgent basis.  States that he has had cardiac catheterization in 2017 and was told to have no significant disease but minor abnormality, echocardiogram and an event monitor was placed and was found to have occasional PVCs.  This was in [place], [place].  Past Medical History:  Diagnosis Date  . Diabetes mellitus without complication ([place])   . Hyperlipemia 12/02/2018  . Hyperlipidemia   . Hypertension   . Type 2 diabetes mellitus with complication, without long-term current use of insulin ([place]) 12/02/2018    Past Surgical History:  Procedure Laterality Date  . CARDIAC CATHETERIZATION      Social History   Socioeconomic History  . Marital status: Married    Spouse name: Not on file  . Number of children: 2  . Years of education: Not on file  . Highest education level: Not on file  Occupational History  . Not on file  Social Needs  . Financial resource strain: Not on file  . Food insecurity    Worry: Not on file    Inability: Not on file  . Transportation needs    Medical: Not on file    Non-medical: Not on file  Tobacco Use  .  Smoking status: Former Smoker    Packs/day: 1.00    Types: Cigarettes    Quit date: 1980    Years since quitting: 40.5  . Smokeless tobacco: Never Used  . Tobacco comment: started at age 12  Substance and Sexual Activity  . Alcohol use: Never    Frequency: Never  . Drug use: Not Currently  . Sexual activity: Not on file  Lifestyle  . Physical activity    Days per week: Not on file    Minutes per session: Not on file  . Stress: Not on file  Relationships  . Social Herbalist on phone: Not on file    Gets together: Not on file    Attends religious service: Not on file    Active member of club or organization: Not on file    Attends meetings of clubs or organizations: Not on file    Relationship status: Not on file  . Intimate partner violence    Fear of current or ex partner: Not on file    Emotionally abused: Not on file    Physically abused: Not on file    Forced sexual activity: Not on file  Other Topics Concern  . Not on file  Social History Narrative  . Not on file  Review of Systems  Constitution: Negative for chills, decreased appetite, malaise/fatigue and weight gain.  HENT: Positive for hearing loss.   Cardiovascular: Positive for palpitations. Negative for dyspnea on exertion, leg swelling and syncope.  Endocrine: Negative for cold intolerance.  Hematologic/Lymphatic: Does not bruise/bleed easily.  Musculoskeletal: Positive for joint pain (diffuse). Negative for joint swelling.  Gastrointestinal: Positive for abdominal pain. Negative for anorexia, change in bowel habit, heartburn, hematochezia and melena. Bloating: has diverticulosis.  Neurological: Positive for light-headedness and weakness. Negative for headaches.  Psychiatric/Behavioral: Negative for depression and substance abuse.  All other systems reviewed and are negative.  Objective  Blood pressure (!) 157/84, pulse 80, height 5\' 10"  (1.778 m), weight 188 lb 11.2 oz (85.6 kg), SpO2 98 %. Body  mass index is 27.08 kg/m. Orthostatic VS for the past 24 hrs (Last 3 readings):  BP- Lying Pulse- Lying BP- Sitting Pulse- Sitting BP- Standing at 0 minutes Pulse- Standing at 0 minutes  12/02/18 1450 157/84 80 122/78 90 113/65 93   Physical Exam  Constitutional: He appears well-developed and well-nourished. No distress.  HENT:  Head: Atraumatic.  Eyes: Conjunctivae are normal.  Neck: Neck supple. No JVD present. No thyromegaly present.  Cardiovascular: Normal rate, regular rhythm, normal heart sounds and intact distal pulses. Exam reveals no gallop.  No murmur heard. Pulmonary/Chest: Effort normal and breath sounds normal.  Abdominal: Soft. Bowel sounds are normal.  Musculoskeletal: Normal range of motion.  Neurological: He is alert.  Skin: Skin is warm and dry.  Psychiatric: He has a normal mood and affect.   Radiology: No results found.  Laboratory examination:   CMP Latest Ref Rng & Units 10/27/2018 04/24/2018 08/28/2017  Glucose 70 - 99 mg/dL 151(H) 161(H) 134  BUN 8 - 23 mg/dL 19 20 23   Creatinine 0.61 - 1.24 mg/dL 1.57(H) 1.27(H) 1.36(H)  Sodium 135 - 145 mmol/L 136 137 136  Potassium 3.5 - 5.1 mmol/L 4.5 4.0 4.4  Chloride 98 - 111 mmol/L 103 104 100  CO2 22 - 32 mmol/L 22 21(L) 25  Calcium 8.9 - 10.3 mg/dL 9.3 9.4 10.2  Total Protein 6.5 - 8.1 g/dL 7.0 6.7 7.3  Total Bilirubin 0.3 - 1.2 mg/dL 0.4 0.3 0.5  Alkaline Phos 38 - 126 U/L 95 103 101  AST 15 - 41 U/L 14(L) 11(L) 10  ALT 0 - 44 U/L 17 12 10    CBC Latest Ref Rng & Units 10/27/2018 04/24/2018 08/28/2017  WBC 4.0 - 10.5 K/uL 24.2(H) 21.0(H) 15.9(H)  Hemoglobin 13.0 - 17.0 g/dL 12.9(L) 12.5(L) 13.9  Hematocrit 39.0 - 52.0 % 39.1 36.7(L) 40.5  Platelets 150 - 400 K/uL 175 159 171   Lipid Panel  No results found for: CHOL, TRIG, HDL, CHOLHDL, VLDL, LDLCALC, LDLDIRECT HEMOGLOBIN A1C No results found for: HGBA1C, MPG TSH Recent Labs    04/24/18 1447 10/27/18 0846  TSH 0.562 1.109    Medications   Current  Outpatient Medications  Medication Instructions  . aspirin EC 81 mg, Oral, Daily  . carboxymethylcellulose (REFRESH PLUS) 0.5 % SOLN 1 drop, 3 times daily PRN  . docusate sodium (COLACE) 100 mg, Oral, Daily  . esomeprazole (NEXIUM) 20 mg, Oral, Daily  . Glucose Blood (IGLUCOSE TEST STRIPS VI) In Vitro  . Lancets MISC Does not apply  . losartan (COZAAR) 25 mg, Oral, Daily after supper  . metFORMIN (GLUCOPHAGE) 500 mg, Oral, 2 times daily  . Riboflavin (VITAMIN B-2 PO) Oral, Daily  . Rosuvastatin Calcium 5 MG CPSP Oral, Daily, 1/2  tab daily   . vitamin C 1,000 mg, Oral, 2 times daily  . VITAMIN D PO Oral, Daily   Cardiac Studies:   Coronary Angiography 2017: In [place], [place]: No significant CAD.   Assessment  Near syncope - Plan: PCV ECHOCARDIOGRAM COMPLETE, CARDIAC EVENT MONITOR  Autonomic orthostatic hypotension - Plan: PCV ECHOCARDIOGRAM COMPLETE, Compression stockings  Essential hypertension - Plan: EKG 12-Lead  Type 2 diabetes mellitus with complication, without long-term current use of insulin ([place])  Pure hypercholesterolemia  EKG 12/02/2018: Normal sinus rhythm with rate of 78 bpm, left atrial abnormality, left axis deviation on anterior physical block.  Incomplete left bundle branch block.  Poor R wave progression, IVCD, LVH.  Normal QT interval.  Recommendations:   Patient with diabetes with stage III chronic kidney disease, hypertension, hyperlipidemia, referred to me for evaluation of palpitations and near syncopal spells, he has severe orthostatic hypotension.  Suspect diabetic peripheral neuropathy and autonomic neuropathy.  Support stockings with.  Advised him to change losartan from morning to be taken in the evening.  Supine hypertension and orthostatic hypotension will be difficult to treat, but prefer hypertension.  He will wear the support stockings only in the morning.  We will continue to monitor his blood pressure and orthostatics at home and bring me the  recordings.  An event monitor to exclude conduction system abnormality in view of suddenness of symptoms and an echocardiogram will be performed.  As patient has had cardiac catheterization just 2 years ago and was not found to have significant disease and he has no symptoms of dyspnea or chest pain, stress test was not ordered.  He has vascular examination otherwise was normal.  I would like to see him back in 4 to 6 weeks for follow-up.  His wife was present over the telephone while I was interviewing the patient and examining the patient and also discussed the findings with her.  Adrian Prows, MD, [facility] 12/02/2018, 3:32 PM [facility]. [place] Pager: [phone] Office: [phone] If no answer Cell [phone]

## 2018-12-09 DIAGNOSIS — C9110 Chronic lymphocytic leukemia of B-cell type not having achieved remission: Secondary | ICD-10-CM | POA: Diagnosis not present

## 2018-12-09 DIAGNOSIS — I129 Hypertensive chronic kidney disease with stage 1 through stage 4 chronic kidney disease, or unspecified chronic kidney disease: Secondary | ICD-10-CM | POA: Diagnosis not present

## 2018-12-09 DIAGNOSIS — N183 Chronic kidney disease, stage 3 (moderate): Secondary | ICD-10-CM | POA: Diagnosis not present

## 2018-12-11 ENCOUNTER — Other Ambulatory Visit: Payer: Self-pay | Admitting: Nephrology

## 2018-12-11 DIAGNOSIS — N183 Chronic kidney disease, stage 3 unspecified: Secondary | ICD-10-CM

## 2018-12-12 ENCOUNTER — Other Ambulatory Visit: Payer: Self-pay

## 2018-12-12 ENCOUNTER — Ambulatory Visit
Admission: RE | Admit: 2018-12-12 | Discharge: 2018-12-12 | Disposition: A | Discharge status: Home / Self Care | Payer: Medicare Other | Source: Ambulatory Visit | Attending: Nephrology | Admitting: Nephrology

## 2018-12-12 DIAGNOSIS — N183 Chronic kidney disease, stage 3 unspecified: Secondary | ICD-10-CM

## 2018-12-12 DIAGNOSIS — N400 Enlarged prostate without lower urinary tract symptoms: Secondary | ICD-10-CM | POA: Diagnosis not present

## 2018-12-29 IMAGING — CT CT CHEST W/ CM
2 of 5 series · 12 of 36 positions shown, 15 images · IV contrast (OMNIPAQUE)
Comparison: None.

CLINICAL DATA: Chronic lymphocytic leukemia.

EXAM:
CT CHEST, ABDOMEN, AND PELVIS WITH CONTRAST
TECHNIQUE: Multidetector CT imaging of the chest, abdomen and pelvis was
performed following the standard protocol during bolus
administration of intravenous contrast.
CONTRAST:  100mL OMNIPAQUE IOHEXOL 300 MG/ML  SOLN

[Series 2: cap with · axial · 0.84mm/px · z∈[+1261,+1791]mm · 9 of 134 slices shown, 12 images]
[im 14/134  mediastinal]
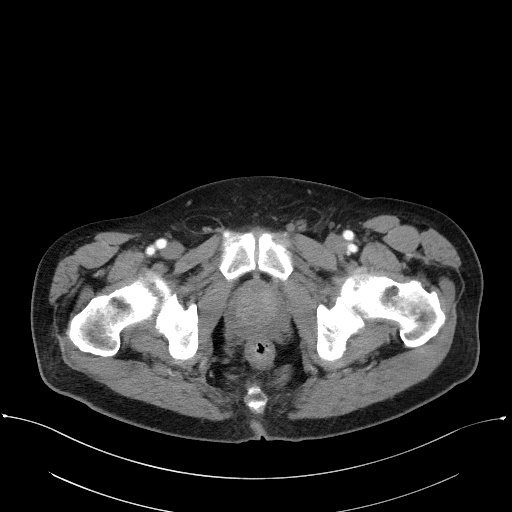
[im 14/134  lung]
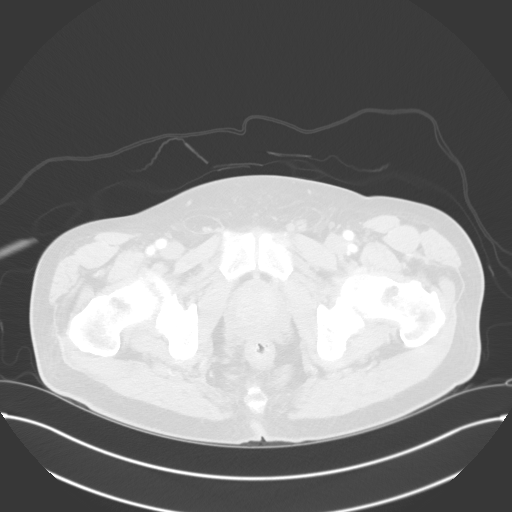
[im 27/134  lung]
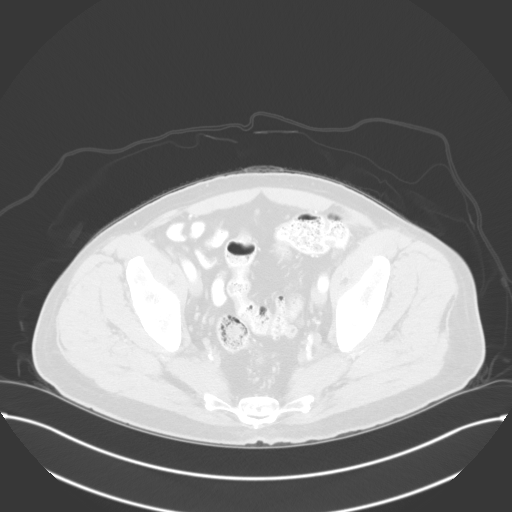
[im 40/134  lung]
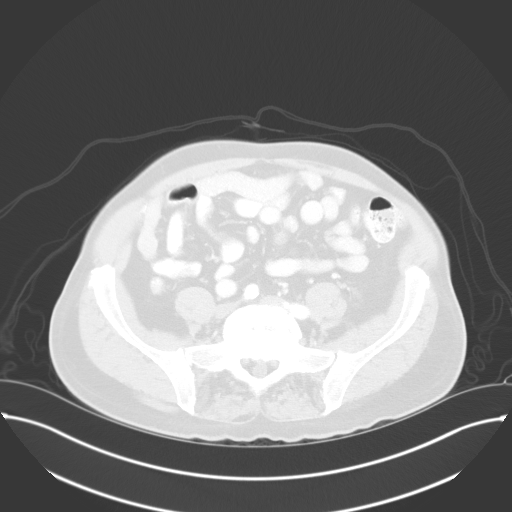
[im 54/134  lung]
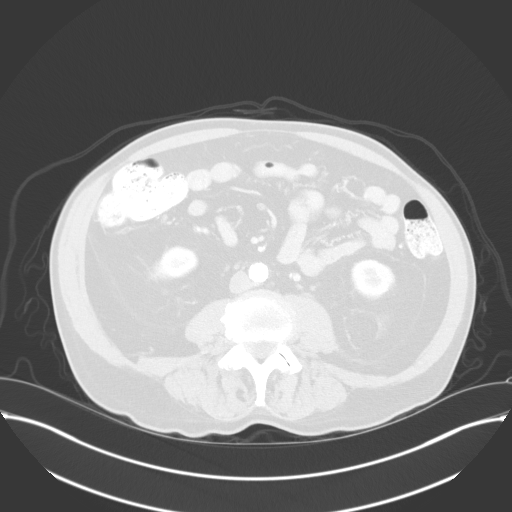
[im 67/134  mediastinal]
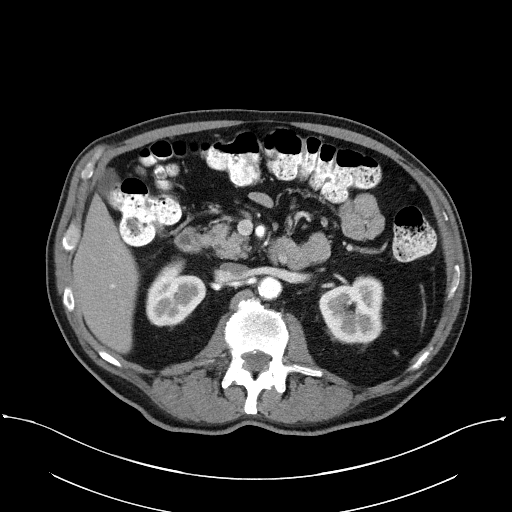
[im 67/134  lung]
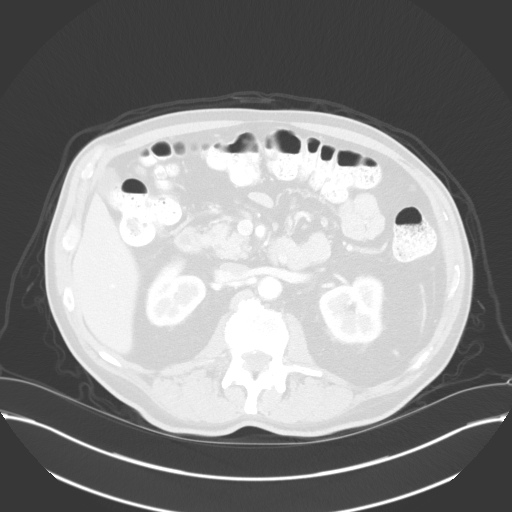
[im 80/134  lung]
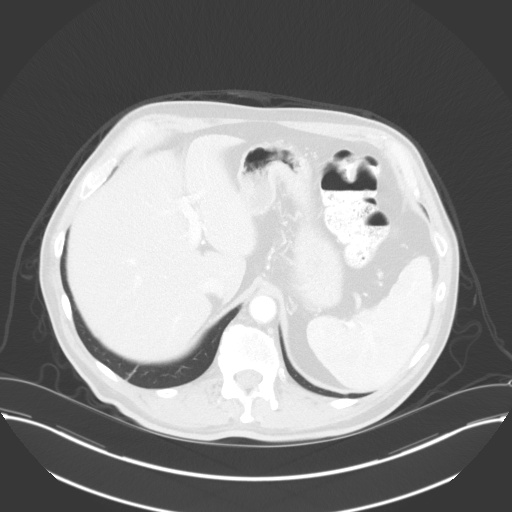
[im 94/134  lung]
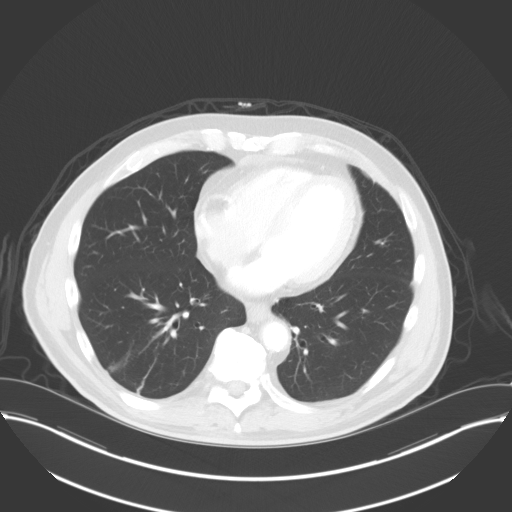
[im 107/134  lung]
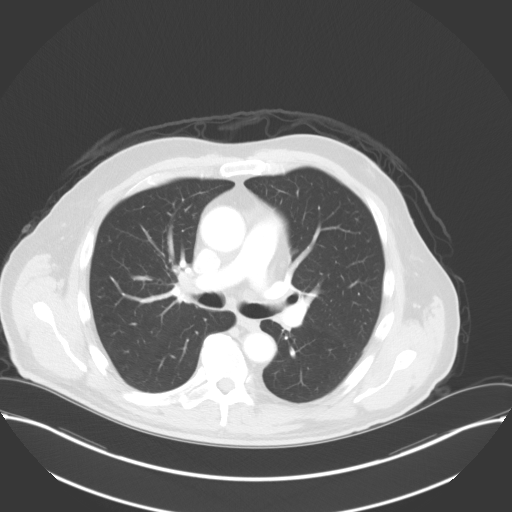
[im 120/134  mediastinal]
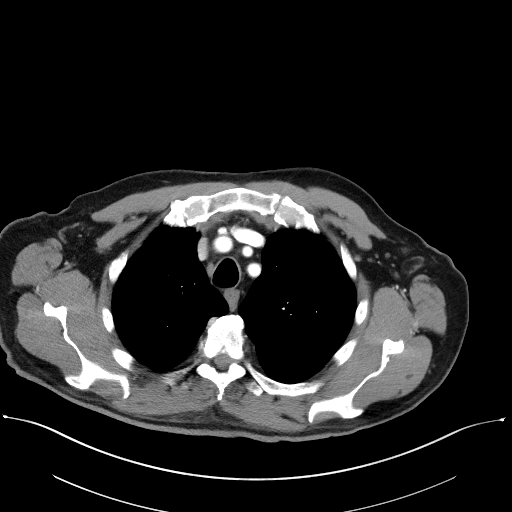
[im 120/134  lung]
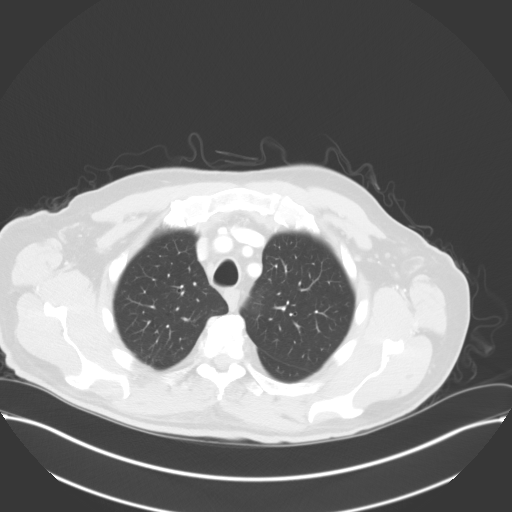

[Series 4: coronals · coronal · 0.99mm/px · 3 of 139 slices shown]
[im 28/139  lung]
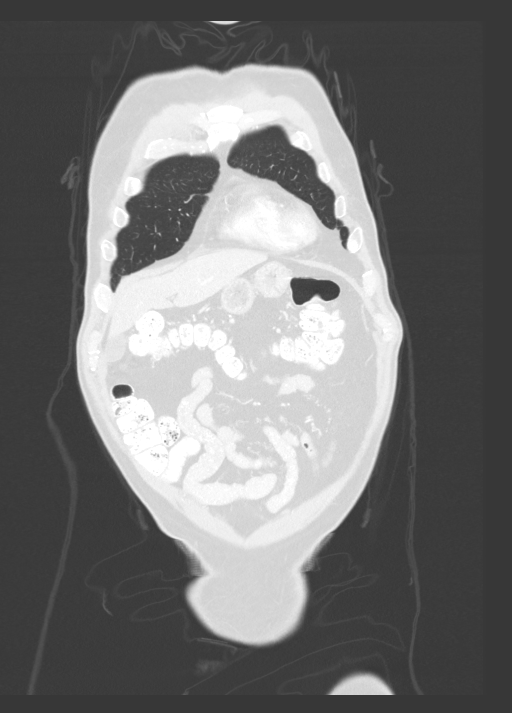
[im 56/139  lung]
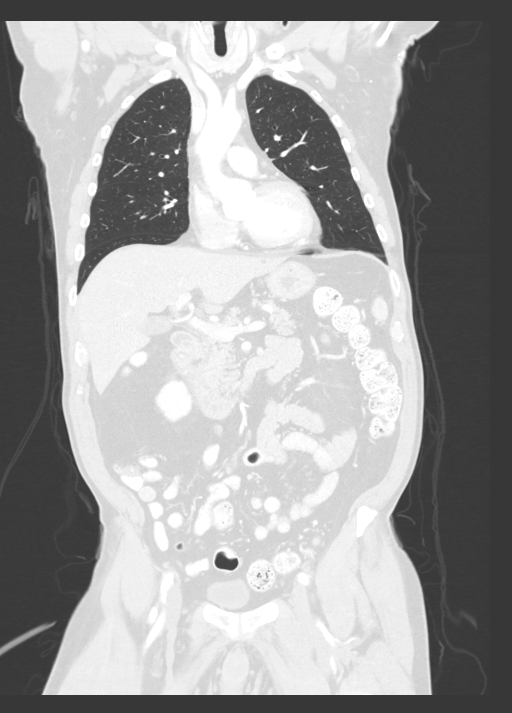
[im 83/139  lung]
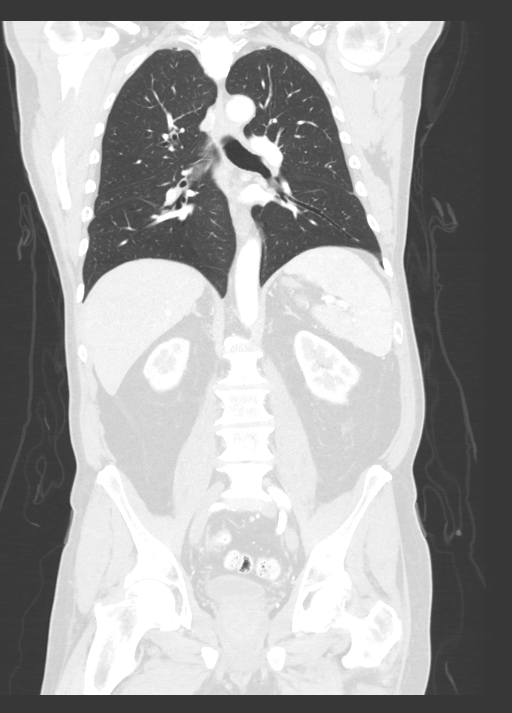

[12 of 36 positions shown; findings below may reference images not displayed]

FINDINGS: CT CHEST FINDINGS

Cardiovascular: The heart size appears normal. Mild pericardial
thickening versus small effusion noted. Aortic atherosclerosis
noted. Calcification in the LAD and left circumflex coronary
arteries noted.

Mediastinum/Nodes: Normal appearance of the thyroid gland. The
trachea appears patent and is midline. Normal appearance of the
esophagus. Scattered small subcentimeter mediastinal and hilar lymph
nodes identified. No thoracic adenopathy identified.

Lungs/Pleura: No pleural effusion. 5 mm right upper lobe lung
nodules noted, image 73/6. Scar noted within both lower lobes
posteriorly. Calcified granuloma identified within the left upper
lobe. Calcified granuloma also noted within the medial right upper
lobe.

Musculoskeletal: Mild scoliosis and degenerative disc disease noted
within the thoracic spine. No suspicious bone lesions.

CT ABDOMEN PELVIS FINDINGS

Hepatobiliary: A few tiny low-density structures within the dome of
liver are identified. Too small to characterize. The gallbladder
appears normal. No biliary dilatation.

Pancreas: Unremarkable. No pancreatic ductal dilatation or
surrounding inflammatory changes.

Spleen: Normal in size without focal abnormality.

Adrenals/Urinary Tract: Normal adrenal glands. Normal appearance of
the kidneys. The urinary bladder appears normal.

Stomach/Bowel: The stomach is normal. The small bowel loops have a
normal course and caliber. The appendix is visualized and appears
normal. Unremarkable appearance of the colon.

Vascular/Lymphatic: Aortic atherosclerosis. No aneurysm. No enlarged
lymph nodes within the abdomen or pelvis. No inguinal adenopathy.

Reproductive: Prostate is unremarkable.

Other: No abdominal wall hernia or abnormality. No abdominopelvic
ascites.

Musculoskeletal: Spondylosis noted within the lumbar spine. No
aggressive lytic or sclerotic bone lesions.
IMPRESSION: 1. No adenopathy identified within the chest abdomen or pelvis.
2. Normal size spleen.
3. Aortic Atherosclerosis ([id number].F). LAD and left circumflex
coronary artery calcification.
4. 5 mm right lung nodule. Nonspecific. No follow-up needed if
patient is low-risk. Non-contrast chest CT can be considered in 12
months if patient is high-risk. This recommendation follows the
consensus statement: Guidelines for Management of Incidental
Pulmonary Nodules Detected on CT Images: From the [HOSPITAL]
5. Prior granulomatous disease.

## 2019-01-05 ENCOUNTER — Ambulatory Visit (INDEPENDENT_AMBULATORY_CARE_PROVIDER_SITE_OTHER): Payer: Medicare Other

## 2019-01-05 ENCOUNTER — Other Ambulatory Visit: Payer: Self-pay

## 2019-01-05 DIAGNOSIS — R55 Syncope and collapse: Secondary | ICD-10-CM | POA: Diagnosis not present

## 2019-01-05 DIAGNOSIS — I951 Orthostatic hypotension: Secondary | ICD-10-CM

## 2019-01-06 DIAGNOSIS — Z570 Occupational exposure to noise: Secondary | ICD-10-CM | POA: Diagnosis not present

## 2019-01-06 DIAGNOSIS — H903 Sensorineural hearing loss, bilateral: Secondary | ICD-10-CM | POA: Diagnosis not present

## 2019-01-19 ENCOUNTER — Encounter: Payer: Self-pay | Admitting: Cardiology

## 2019-01-19 ENCOUNTER — Other Ambulatory Visit: Payer: Self-pay

## 2019-01-19 ENCOUNTER — Ambulatory Visit (INDEPENDENT_AMBULATORY_CARE_PROVIDER_SITE_OTHER): Payer: Medicare Other | Admitting: Cardiology

## 2019-01-19 VITALS — Temp 98.7°F | Ht 70.0 in | Wt 182.0 lb

## 2019-01-19 DIAGNOSIS — I951 Orthostatic hypotension: Secondary | ICD-10-CM | POA: Diagnosis not present

## 2019-01-19 DIAGNOSIS — R55 Syncope and collapse: Secondary | ICD-10-CM | POA: Diagnosis not present

## 2019-01-19 DIAGNOSIS — I10 Essential (primary) hypertension: Secondary | ICD-10-CM | POA: Diagnosis not present

## 2019-01-19 NOTE — Progress Notes (Signed)
Primary Physician/Referring:  [name], MD  Patient ID: Eric Harrington, male    DOB: 1940/12/14, 78 y.o.   MRN: [id number]  Subjective   Chief Complaint  Patient presents with  . Loss of Consciousness    f/u     HPI: Eric Harrington  is a 78 y.o. Caucasian male with longstanding history of diabetes mellitus with stage 3 CKD, hyperlipidemia, hypertension,presents for f/u evaluation of palpitations and near syncopal spells, he has severe orthostatic hypotension.  He is now compliant with support hose use.  He states since discontinuing amlodipine, dizziness symptoms are improved.  Also since discontinuing Zetia, states that his joint pain symptoms are also improved.  He is presently feeling well.  States that he has had cardiac catheterization in 2017 and was told to have no significant disease but minor abnormality, echocardiogram and an event monitor was placed and was found to have occasional PVCs.  This was in [place], [place]. He underwent repeat echocardiogram and event monitor presents for follow-up.  Wife is present.  Past Medical History:  Diagnosis Date  . Diabetes mellitus without complication ([place])   . Hyperlipemia 12/02/2018  . Hyperlipidemia   . Hypertension   . Type 2 diabetes mellitus with complication, without long-term current use of insulin ([place]) 12/02/2018    Past Surgical History:  Procedure Laterality Date  . CARDIAC CATHETERIZATION      Social History   Socioeconomic History  . Marital status: Married    Spouse name: Not on file  . Number of children: 2  . Years of education: Not on file  . Highest education level: Not on file  Occupational History  . Not on file  Social Needs  . Financial resource strain: Not on file  . Food insecurity    Worry: Not on file    Inability: Not on file  . Transportation needs    Medical: Not on file    Non-medical: Not on file  Tobacco Use  . Smoking status: Former Smoker    Packs/day: 1.00    Types: Cigarettes   Quit date: 1980    Years since quitting: 40.6  . Smokeless tobacco: Never Used  . Tobacco comment: started at age 47  Substance and Sexual Activity  . Alcohol use: Never    Frequency: Never  . Drug use: Not Currently  . Sexual activity: Not on file  Lifestyle  . Physical activity    Days per week: Not on file    Minutes per session: Not on file  . Stress: Not on file  Relationships  . Social Herbalist on phone: Not on file    Gets together: Not on file    Attends religious service: Not on file    Active member of club or organization: Not on file    Attends meetings of clubs or organizations: Not on file    Relationship status: Not on file  . Intimate partner violence    Fear of current or ex partner: Not on file    Emotionally abused: Not on file    Physically abused: Not on file    Forced sexual activity: Not on file  Other Topics Concern  . Not on file  Social History Narrative  . Not on file   Review of Systems  Constitution: Negative for chills, decreased appetite, malaise/fatigue and weight gain.  HENT: Positive for hearing loss.   Cardiovascular: Positive for palpitations. Negative for dyspnea on exertion, leg swelling and syncope.  Endocrine: Negative for cold intolerance.  Hematologic/Lymphatic: Does not bruise/bleed easily.  Musculoskeletal: Positive for joint pain (diffuse). Negative for joint swelling.  Gastrointestinal: Positive for abdominal pain. Negative for anorexia, change in bowel habit, heartburn, hematochezia and melena. Bloating: has diverticulosis.  Neurological: Positive for light-headedness and weakness. Negative for headaches.  Psychiatric/Behavioral: Negative for depression and substance abuse.  All other systems reviewed and are negative.  Objective  Temperature 98.7 F (37.1 C), height 5\' 10"  (1.778 m), weight 182 lb (82.6 kg), SpO2 96 %. Body mass index is 26.11 kg/m.  Orthostatic BP  145/97  125/81  125/81    BP Location   LeftArm  LeftArm    Patient Position  Sitting  Standing  Standing   Cuff Size  Normal  Normal  Normal   Orthostatic Pulse  85  87    Temp   98.28F(37.1C)    SpO2  96%  96%      Physical Exam  Constitutional: He appears well-developed and well-nourished. No distress.  HENT:  Head: Atraumatic.  Eyes: Conjunctivae are normal.  Neck: Neck supple. No JVD present. No thyromegaly present.  Cardiovascular: Normal rate, regular rhythm, normal heart sounds and intact distal pulses. Exam reveals no gallop.  No murmur heard. Pulmonary/Chest: Effort normal and breath sounds normal.  Abdominal: Soft. Bowel sounds are normal.  Musculoskeletal: Normal range of motion.  Neurological: He is alert.  Skin: Skin is warm and dry.  Psychiatric: He has a normal mood and affect.   Radiology: No results found.  Laboratory examination:   CMP Latest Ref Rng & Units 10/27/2018 04/24/2018 08/28/2017  Glucose 70 - 99 mg/dL 151(H) 161(H) 134  BUN 8 - 23 mg/dL 19 20 23   Creatinine 0.61 - 1.24 mg/dL 1.57(H) 1.27(H) 1.36(H)  Sodium 135 - 145 mmol/L 136 137 136  Potassium 3.5 - 5.1 mmol/L 4.5 4.0 4.4  Chloride 98 - 111 mmol/L 103 104 100  CO2 22 - 32 mmol/L 22 21(L) 25  Calcium 8.9 - 10.3 mg/dL 9.3 9.4 10.2  Total Protein 6.5 - 8.1 g/dL 7.0 6.7 7.3  Total Bilirubin 0.3 - 1.2 mg/dL 0.4 0.3 0.5  Alkaline Phos 38 - 126 U/L 95 103 101  AST 15 - 41 U/L 14(L) 11(L) 10  ALT 0 - 44 U/L 17 12 10    CBC Latest Ref Rng & Units 10/27/2018 04/24/2018 08/28/2017  WBC 4.0 - 10.5 K/uL 24.2(H) 21.0(H) 15.9(H)  Hemoglobin 13.0 - 17.0 g/dL 12.9(L) 12.5(L) 13.9  Hematocrit 39.0 - 52.0 % 39.1 36.7(L) 40.5  Platelets 150 - 400 K/uL 175 159 171   Lipid Panel  No results found for: CHOL, TRIG, HDL, CHOLHDL, VLDL, LDLCALC, LDLDIRECT HEMOGLOBIN A1C No results found for: HGBA1C, MPG TSH Recent Labs    04/24/18 1447 10/27/18 0846  TSH 0.562 1.109   Medications   Current Outpatient Medications  Medication  Instructions  . aspirin EC 81 mg, Oral, Daily  . carboxymethylcellulose (REFRESH PLUS) 0.5 % SOLN 1 drop, 3 times daily PRN  . docusate sodium (COLACE) 100 mg, Oral, Daily  . esomeprazole (NEXIUM) 20 mg, Oral, Daily  . Glucose Blood (IGLUCOSE TEST STRIPS VI) In Vitro  . Lancets MISC Does not apply  . losartan (COZAAR) 25 mg, Oral, Daily after supper  . metFORMIN (GLUCOPHAGE) 500 mg, Oral, 2 times daily  . Riboflavin (VITAMIN B-2 PO) Oral, Daily  . Rosuvastatin Calcium 5 MG CPSP Oral, Daily, 1/2 tab daily   . vitamin C 1,000 mg, Oral, 2 times daily  .  VITAMIN D PO Oral, Daily   Cardiac Studies:   Coronary Angiography 2017: In [place], [place]: No significant CAD.   Echocardiogram 01/05/2019: Left ventricle cavity is normal in size. Mild concentric hypertrophy of the left ventricle. Normal global wall motion. Normal LV systolic function with EF 55%. Doppler evidence of grade I (impaired) diastolic dysfunction, normal LAP.  Trileaflet aortic valve. Mild (Grade I) aortic regurgitation. Mild (Grade I) mitral regurgitation. Inadequate TR jet to estimate pulmonary artery systolic pressure. Normal right atrial pressure.   Event Monitor for 30 days Start date  12/02/2018 - 12/31/2018: Baseline sample showed Sinus Rhythm with a heart rate of 82.7 bpm. There were 0 critical, 0 serious, and 3 stable events that occurred.  Automatically Detected Events: 1 Stable: Sinus Rhythm w/Run of V-Tach (5 Beats) at 4 pm. Manually Detected Events: 1 Stable: Sinus Rhythm w/1st Degree AV Block/Artifact  Assessment  Near syncope  Autonomic orthostatic hypotension  Essential hypertension  EKG 12/02/2018: Normal sinus rhythm with rate of 78 bpm, left atrial abnormality, left axis deviation on anterior physical block.  Incomplete left bundle branch block.  Poor R wave progression, IVCD, LVH.  Normal QT interval.  Recommendations:   Patient with diabetes with stage III chronic kidney disease, hypertension,  hyperlipidemia, presents for f/u evaluation of palpitations and near syncopal spells, he has severe orthostatic hypotension.  He is now compliant with support hose use.  Wife present. Suspect diabetic peripheral neuropathy and autonomic neuropathy.  Although he had 5 beat run of NSVT, he was completely asymptomatic.  In view of normal LVEF and a negative cardiac catheterization, no further evaluation is indicated with regard to NSVT.  He is now off of amlodipine and since then he is feeling well and also Zetia has been discontinued.  He is feeling the best he has in quite a while.  I suspect his near syncope was clearly related to orthostasis.  He also brings her pressure recordings from home, blood pressure is very well controlled.  She is also recorded orthostatic blood pressures at home with systolic blood pressure on supine was 150, on standing was 1 mmHg.  Advised him that his therapy for hypertension she be based on standing blood pressure.  Otherwise from cardiac standpoint he remained stable, I do not suspect conduction system disease, he has had normal coronary arteries by angiography about 2 years to 3 years ago.  I'll see him back on a p.r.n. basis.  I will forward my note to nephrology as well. Adrian Prows, MD, [facility] 01/19/2019, 10:20 PM [facility]. [place] Pager: [phone] Office: [phone] If no answer Cell [phone]

## 2019-01-27 DIAGNOSIS — D3610 Benign neoplasm of peripheral nerves and autonomic nervous system, unspecified: Secondary | ICD-10-CM | POA: Diagnosis not present

## 2019-01-27 DIAGNOSIS — I781 Nevus, non-neoplastic: Secondary | ICD-10-CM | POA: Diagnosis not present

## 2019-01-27 DIAGNOSIS — L738 Other specified follicular disorders: Secondary | ICD-10-CM | POA: Diagnosis not present

## 2019-01-27 DIAGNOSIS — L918 Other hypertrophic disorders of the skin: Secondary | ICD-10-CM | POA: Diagnosis not present

## 2019-01-27 DIAGNOSIS — L821 Other seborrheic keratosis: Secondary | ICD-10-CM | POA: Diagnosis not present

## 2019-01-27 DIAGNOSIS — L578 Other skin changes due to chronic exposure to nonionizing radiation: Secondary | ICD-10-CM | POA: Diagnosis not present

## 2019-01-27 DIAGNOSIS — D239 Other benign neoplasm of skin, unspecified: Secondary | ICD-10-CM | POA: Diagnosis not present

## 2019-01-27 DIAGNOSIS — D1801 Hemangioma of skin and subcutaneous tissue: Secondary | ICD-10-CM | POA: Diagnosis not present

## 2019-01-27 DIAGNOSIS — L570 Actinic keratosis: Secondary | ICD-10-CM | POA: Diagnosis not present

## 2019-02-02 DIAGNOSIS — H2513 Age-related nuclear cataract, bilateral: Secondary | ICD-10-CM | POA: Diagnosis not present

## 2019-02-02 DIAGNOSIS — H43813 Vitreous degeneration, bilateral: Secondary | ICD-10-CM | POA: Diagnosis not present

## 2019-02-02 DIAGNOSIS — E119 Type 2 diabetes mellitus without complications: Secondary | ICD-10-CM | POA: Diagnosis not present

## 2019-02-02 DIAGNOSIS — H04123 Dry eye syndrome of bilateral lacrimal glands: Secondary | ICD-10-CM | POA: Diagnosis not present

## 2019-02-09 DIAGNOSIS — Z86010 Personal history of colonic polyps: Secondary | ICD-10-CM | POA: Diagnosis not present

## 2019-03-05 DIAGNOSIS — N183 Chronic kidney disease, stage 3 unspecified: Secondary | ICD-10-CM | POA: Diagnosis not present

## 2019-03-13 DIAGNOSIS — D892 Hypergammaglobulinemia, unspecified: Secondary | ICD-10-CM | POA: Diagnosis not present

## 2019-03-13 DIAGNOSIS — I129 Hypertensive chronic kidney disease with stage 1 through stage 4 chronic kidney disease, or unspecified chronic kidney disease: Secondary | ICD-10-CM | POA: Diagnosis not present

## 2019-03-13 DIAGNOSIS — E559 Vitamin D deficiency, unspecified: Secondary | ICD-10-CM | POA: Diagnosis not present

## 2019-03-13 DIAGNOSIS — C9110 Chronic lymphocytic leukemia of B-cell type not having achieved remission: Secondary | ICD-10-CM | POA: Diagnosis not present

## 2019-03-13 DIAGNOSIS — N183 Chronic kidney disease, stage 3 unspecified: Secondary | ICD-10-CM | POA: Diagnosis not present

## 2019-03-24 DIAGNOSIS — Z23 Encounter for immunization: Secondary | ICD-10-CM | POA: Diagnosis not present

## 2019-04-27 NOTE — Progress Notes (Signed)
HEMATOLOGY ONCOLOGY CLINIC NOTE  Date of Service:  04/28/2019   Patient Care Team: [name], MD as PCP - General (Family Medicine)  CHIEF COMPLAINTS/PURPOSE OF CONSULTATION:  F/u for CLL  HISTORY OF PRESENTING ILLNESS:   Eric Harrington 78 y.o. male is here because of a referral from Dr. Inda Castle from [place] at [facility] regarding a trend in his elevated WBC.   He is accompanied today by his wife of 25 years. The pt reports that he is doing well overall. He recently had a biopsy to check his prostate at [facility] with Dr. Aleen Campi and they took 12 core samples with reportedly no significant findings.  Of note prior to the patient's visit today, pt has had CBC completed on 04/26/17 with results revealing WBC at 14.7, Hgb at 12.6, Lymph Abs at 11.0 with all other values WNL. On 03/26/17 his WBC were 12.8, Hgb at 13.3 and Lymph's at  9.90. On 02/06/17 his WBC were 12.7, Hgb at 13.4, and Lymph's at 8.80.   On review of systems, pt reports post nasal drip, persistent cough, occasional troublesome stomach, pain in his lower abdomen that feels like his muscles are irritated, reports he has lost 25 lbs in the last 2.5 years (he associates a decreased appetite after taking beta blockers during this period) and denies no recent colds or infections, acute changes in energy levels, and leg swelling.   On PMHx the pt reports taking Zetia. He has IB'S and has had 3 colonoscopies with no significant findings or inflammatory processes. He reports that over the last 2.5 years he had heart catheterizations that all turned out well. He reports having diabetes. He also reports neuropathy along his whole right side that lasted for a few months that ceased abruptly.   Interval History:  Eric Harrington returns for management and evaluation of his CLL. We are joined today by Eric Harrington. The patient's last visit with [place] was on 10/27/2018. The pt reports that he is doing well overall.  The pt reports that  he has been feeling well but Mrs. Nolde notes that the pt had a rough summer. Pt was taking amlodipine for his HTN. He and his wife believe that was the cause of some issues that he was having over the summer. Pt has been to see his Cardiologist and Nephrologist in the interim. His Nephrologist believes that pt's kidneys have been stable but was concerned about some amyloid deposits. Pt does not think that his Nephrologist gave any indication for the cause of his CKD. Pt's diabetes has been well controlled and he is currently taking Metformin to manage. Pt has an appointment with Dr. Mannie Stabile on Thursday and a Colonoscopy scheduled for this Friday. He has had some nosebleeds in the interim.   Pt has had to continuously take stool softner due to constipation and notes large and numerous stools when his is able to have a bowel movement. Pt notes that he does not eat many vegetables because they also make him have uncomfortable bowel movements. Pt has a history of diverticulitis and IBS.    Pt and his wife have continued to attend church service with social distancing and see family and friends often.   Lab results today (04/28/19) of CBC w/diff and CMP is as follows: all values are WNL except for WBC at 24.6K, RBC at 4.12, Hgb at 12.6, HCT at 37.9, PLTs at 147K, Lymphs Abs at 19.8K, Mono Abs at 1.6K, Glucose at 179, Creatinine at 1.45,  AST at 14, GFR Est Non Afr Am at 46. 04/28/2019 LDH at 159  On review of systems, pt reports epistaxis, constipation and denies leg swelling and any other symptoms.   MEDICAL HISTORY:  1. HTN 2. DM2 3. H/o PAC's 4. transient a fib with cardiac cath 5. HLD 6. CKD stage 3 7. Irritable bowel syndrome 8. GERD 9. H/o presyndope  SURGICAL HISTORY:  1. Prostate bx 2. Cardiac cath  SOCIAL HISTORY: Social History   Socioeconomic History  . Marital status: Married    Spouse name: Not on file  . Number of children: 2  . Years of education: Not on file  . Highest  education level: Not on file  Occupational History  . Not on file  Social Needs  . Financial resource strain: Not on file  . Food insecurity    Worry: Not on file    Inability: Not on file  . Transportation needs    Medical: Not on file    Non-medical: Not on file  Tobacco Use  . Smoking status: Former Smoker    Packs/day: 1.00    Types: Cigarettes    Quit date: 1980    Years since quitting: 40.9  . Smokeless tobacco: Never Used  . Tobacco comment: started at age 78  Substance and Sexual Activity  . Alcohol use: Never    Frequency: Never  . Drug use: Not Currently  . Sexual activity: Not on file  Lifestyle  . Physical activity    Days per week: Not on file    Minutes per session: Not on file  . Stress: Not on file  Relationships  . Social Herbalist on phone: Not on file    Gets together: Not on file    Attends religious service: Not on file    Active member of club or organization: Not on file    Attends meetings of clubs or organizations: Not on file    Relationship status: Not on file  . Intimate partner violence    Fear of current or ex partner: Not on file    Emotionally abused: Not on file    Physically abused: Not on file    Forced sexual activity: Not on file  Other Topics Concern  . Not on file  Social History Narrative  . Not on file    FAMILY HISTORY: Family History  Problem Relation Age of Onset  . Lung cancer Mother   . Heart attack Father   . Dementia Sister   . Diabetes Brother   . Diabetes Sister   . Heart attack Brother     ALLERGIES:  is allergic to bystolic [nebivolol hcl]; cephalexin; gemfibrozil; glipizide; januvia [sitagliptin]; lipitor [atorvastatin calcium]; lisinopril; losartan; metoprolol; omeprazole; simvastatin; and gabapentin.  MEDICATIONS:  Current Outpatient Medications  Medication Sig Dispense Refill  . Ascorbic Acid (VITAMIN C) 1000 MG tablet Take 1,000 mg by mouth 2 (two) times a day.    Marland Kitchen aspirin EC 81 MG  tablet Take 81 mg by mouth daily.    . carboxymethylcellulose (REFRESH PLUS) 0.5 % SOLN 1 drop 3 (three) times daily as needed.    . docusate sodium (COLACE) 100 MG capsule Take 100 mg by mouth daily.     Marland Kitchen esomeprazole (NEXIUM) 20 MG capsule Take 20 mg by mouth daily at 12 noon.    . Glucose Blood (IGLUCOSE TEST STRIPS VI) by In Vitro route.    . Lancets MISC by Does not apply route.    Marland Kitchen  losartan (COZAAR) 25 MG tablet Take 25 mg by mouth daily after supper.    . metFORMIN (GLUCOPHAGE) 1000 MG tablet Take 500 mg by mouth 2 (two) times daily.     . Riboflavin (VITAMIN B-2 PO) Take by mouth daily.    . Rosuvastatin Calcium 5 MG CPSP Take by mouth daily. 1/2 tab daily    . VITAMIN D PO Take by mouth daily.     No current facility-administered medications for this visit.     REVIEW OF SYSTEMS:   A 10+ POINT REVIEW OF SYSTEMS WAS OBTAINED including neurology, dermatology, psychiatry, cardiac, respiratory, lymph, extremities, GI, GU, Musculoskeletal, constitutional, breasts, reproductive, HEENT.  All pertinent positives are noted in the HPI.  All others are negative.   PHYSICAL EXAMINATION:  Vitals:   04/28/19 1335  BP: (!) 124/91  Pulse: 91  Resp: 18  Temp: 97.7 F (36.5 C)  SpO2: 98%   Filed Weights   04/28/19 1335  Weight: 188 lb 3.2 oz (85.4 kg)   GENERAL:alert, in no acute distress and comfortable SKIN: no acute rashes, no significant lesions EYES: conjunctiva are pink and non-injected, sclera anicteric OROPHARYNX: MMM, no exudates, no oropharyngeal erythema or ulceration NECK: supple, no JVD LYMPH:  no palpable lymphadenopathy in the cervical, axillary or inguinal regions  LUNGS: clear to auscultation b/l with normal respiratory effort HEART: regular rate & rhythm ABDOMEN:  normoactive bowel sounds , non tender, not distended. No palpable hepatosplenomegaly.  Extremity: no pedal edema PSYCH: alert & oriented x 3 with fluent speech NEURO: no focal motor/sensory deficits   LABORATORY DATA:  I have reviewed the data as listed  . CBC Latest Ref Rng & Units 04/28/2019 10/27/2018 04/24/2018  WBC 4.0 - 10.5 K/uL 24.6(H) 24.2(H) 21.0(H)  Hemoglobin 13.0 - 17.0 g/dL 12.6(L) 12.9(L) 12.5(L)  Hematocrit 39.0 - 52.0 % 37.9(L) 39.1 36.7(L)  Platelets 150 - 400 K/uL 147(L) 175 159    . CBC    Component Value Date/Time   WBC 24.6 (H) 04/28/2019 1257   RBC 4.12 (L) 04/28/2019 1257   HGB 12.6 (L) 04/28/2019 1257   HGB 13.9 08/28/2017 0937   HCT 37.9 (L) 04/28/2019 1257   PLT 147 (L) 04/28/2019 1257   PLT 171 08/28/2017 0937   MCV 92.0 04/28/2019 1257   MCH 30.6 04/28/2019 1257   MCHC 33.2 04/28/2019 1257   RDW 13.0 04/28/2019 1257   LYMPHSABS 19.8 (H) 04/28/2019 1257   MONOABS 1.6 (H) 04/28/2019 1257   EOSABS 0.0 04/28/2019 1257   BASOSABS 0.1 04/28/2019 1257   .Marland Kitchen Lab Results  Component Value Date   RETICCTPCT 1.4 04/24/2018   RBC 4.12 (L) 04/28/2019    CMP Latest Ref Rng & Units 04/28/2019 10/27/2018 04/24/2018  Glucose 70 - 99 mg/dL 179(H) 151(H) 161(H)  BUN 8 - 23 mg/dL 16 19 20   Creatinine 0.61 - 1.24 mg/dL 1.45(H) 1.57(H) 1.27(H)  Sodium 135 - 145 mmol/L 137 136 137  Potassium 3.5 - 5.1 mmol/L 4.1 4.5 4.0  Chloride 98 - 111 mmol/L 101 103 104  CO2 22 - 32 mmol/L 24 22 21(L)  Calcium 8.9 - 10.3 mg/dL 9.0 9.3 9.4  Total Protein 6.5 - 8.1 g/dL 6.7 7.0 6.7  Total Bilirubin 0.3 - 1.2 mg/dL 0.4 0.4 0.3  Alkaline Phos 38 - 126 U/L 92 95 103  AST 15 - 41 U/L 14(L) 14(L) 11(L)  ALT 0 - 44 U/L 20 17 12    . Lab Results  Component Value Date   LDH  159 04/28/2019   Component     Latest Ref Rng & Units 06/13/2017  HCV Ab     0.0 - 0.9 s/co ratio <0.1  Hepatitis B Surface Ag     Negative Negative  Hep B Core Ab, Tot     Negative Negative         RADIOGRAPHIC STUDIES: I have personally reviewed the radiological images as listed and agreed with the findings in the report. No results found.  ASSESSMENT & PLAN:   78 y.o. is a male with  1.  Recently diagnosed Chronic lymphocytic leukemia. Likely Rai Stage 0  -he presented with Lymphocytosis incidentally noted on routine labs No associated significant anemia or thrombocytopenia. No constitutional symptoms No overt clinically palpable LNadenopathy or hepato-splenomegaly. 13q mutation present   08/28/17 CT C/A/P revealed Normal sized spleen, no enlarged lymph nodes. There is evidence of very small lung nodule, likely related to inflammatory process. Will monitor with CT chest in 12 months   PLAN:  -Discussed pt labwork today, 04/28/19; blood count are steady, blood chemistries  -Discussed 04/28/2019 LDH at 159 -No palpable lymphadenopathy or splenomegaly upon examination -The pt shows no overt clinical or lab progression of his CLL at this time.  -No indication to initiate treatment at this time.  -Will get a MMP with next labs -Recommended pt continue to Colace for constipation -Recommeded pt use a diffuser at night to help with nasal dryness -Continue follow up with PCP for management and evaluation of DM -Continue f/u with Nephrologist for CKD  -Continue Vitamin B complex, as he is taking Metformin -Recommend continue skin screening with dermatology given the risk of non melanoma skin cancers, the pt notes he will do this -Previously recommended pt being up to date on his vaccines; and he's had both pneumonia vaccines and his flu shot.  -Will see the pt back in 6 months, sooner if any new concerns   FOLLOW UP: RTC with Dr Irene Limbo with labs in 6 months   The total time spent in the appt was 20 minutes and more than 50% was on counseling and direct patient cares.  All of the patient's questions were answered with apparent satisfaction. The patient knows to call the clinic with any problems, questions or concerns.   Sullivan Lone MD [place] AAHIV[facility] Hematology/Oncology Physician [facility]  (Office):       [phone] (Work cell):  [phone] (Fax):            [phone]  I, Yevette Edwards, am acting as a scribe for Dr. Sullivan Lone.   .I have reviewed the above documentation for accuracy and completeness, and I agree with the above. Brunetta Genera MD

## 2019-04-28 ENCOUNTER — Inpatient Hospital Stay: Payer: Medicare Other | Attending: Hematology

## 2019-04-28 ENCOUNTER — Telehealth: Payer: Self-pay | Admitting: Hematology

## 2019-04-28 ENCOUNTER — Other Ambulatory Visit: Payer: Self-pay

## 2019-04-28 ENCOUNTER — Inpatient Hospital Stay (HOSPITAL_BASED_OUTPATIENT_CLINIC_OR_DEPARTMENT_OTHER): Payer: Medicare Other | Admitting: Hematology

## 2019-04-28 VITALS — BP 124/91 | HR 91 | Temp 97.7°F | Resp 18 | Ht 70.0 in | Wt 188.2 lb

## 2019-04-28 DIAGNOSIS — K219 Gastro-esophageal reflux disease without esophagitis: Secondary | ICD-10-CM | POA: Insufficient documentation

## 2019-04-28 DIAGNOSIS — N183 Chronic kidney disease, stage 3 unspecified: Secondary | ICD-10-CM | POA: Insufficient documentation

## 2019-04-28 DIAGNOSIS — C9190 Lymphoid leukemia, unspecified not having achieved remission: Secondary | ICD-10-CM | POA: Insufficient documentation

## 2019-04-28 DIAGNOSIS — I4891 Unspecified atrial fibrillation: Secondary | ICD-10-CM | POA: Diagnosis not present

## 2019-04-28 DIAGNOSIS — Z87891 Personal history of nicotine dependence: Secondary | ICD-10-CM | POA: Diagnosis not present

## 2019-04-28 DIAGNOSIS — E785 Hyperlipidemia, unspecified: Secondary | ICD-10-CM | POA: Insufficient documentation

## 2019-04-28 DIAGNOSIS — C9110 Chronic lymphocytic leukemia of B-cell type not having achieved remission: Secondary | ICD-10-CM

## 2019-04-28 DIAGNOSIS — E119 Type 2 diabetes mellitus without complications: Secondary | ICD-10-CM | POA: Diagnosis not present

## 2019-04-28 DIAGNOSIS — K589 Irritable bowel syndrome without diarrhea: Secondary | ICD-10-CM | POA: Insufficient documentation

## 2019-04-28 DIAGNOSIS — G629 Polyneuropathy, unspecified: Secondary | ICD-10-CM | POA: Insufficient documentation

## 2019-04-28 DIAGNOSIS — I10 Essential (primary) hypertension: Secondary | ICD-10-CM | POA: Diagnosis not present

## 2019-04-28 DIAGNOSIS — Z03818 Encounter for observation for suspected exposure to other biological agents ruled out: Secondary | ICD-10-CM | POA: Diagnosis not present

## 2019-04-28 DIAGNOSIS — Z79899 Other long term (current) drug therapy: Secondary | ICD-10-CM | POA: Insufficient documentation

## 2019-04-28 DIAGNOSIS — Z7982 Long term (current) use of aspirin: Secondary | ICD-10-CM | POA: Diagnosis not present

## 2019-04-28 DIAGNOSIS — K5900 Constipation, unspecified: Secondary | ICD-10-CM | POA: Insufficient documentation

## 2019-04-28 LAB — CMP (CANCER CENTER ONLY)
ALT: 20 U/L (ref 0–44)
AST: 14 U/L — ABNORMAL LOW (ref 15–41)
Albumin: 4.3 g/dL (ref 3.5–5.0)
Alkaline Phosphatase: 92 U/L (ref 38–126)
Anion gap: 12 (ref 5–15)
BUN: 16 mg/dL (ref 8–23)
CO2: 24 mmol/L (ref 22–32)
Calcium: 9 mg/dL (ref 8.9–10.3)
Chloride: 101 mmol/L (ref 98–111)
Creatinine: 1.45 mg/dL — ABNORMAL HIGH (ref 0.61–1.24)
GFR, Est AFR Am: 54 mL/min — ABNORMAL LOW (ref >60)
GFR, Estimated: 46 mL/min — ABNORMAL LOW (ref >60)
Glucose, Bld: 179 mg/dL — ABNORMAL HIGH (ref 70–99)
Potassium: 4.1 mmol/L (ref 3.5–5.1)
Sodium: 137 mmol/L (ref 135–145)
Total Bilirubin: 0.4 mg/dL (ref 0.3–1.2)
Total Protein: 6.7 g/dL (ref 6.5–8.1)

## 2019-04-28 LAB — CBC WITH DIFFERENTIAL/PLATELET
Abs Immature Granulocytes: 0.04 10*3/uL (ref 0.00–0.07)
Basophils Absolute: 0.1 10*3/uL (ref 0.0–0.1)
Basophils Relative: 0 %
Eosinophils Absolute: 0 10*3/uL (ref 0.0–0.5)
Eosinophils Relative: 0 %
HCT: 37.9 % — ABNORMAL LOW (ref 39.0–52.0)
Hemoglobin: 12.6 g/dL — ABNORMAL LOW (ref 13.0–17.0)
Immature Granulocytes: 0 %
Lymphocytes Relative: 80 %
Lymphs Abs: 19.8 10*3/uL — ABNORMAL HIGH (ref 0.7–4.0)
MCH: 30.6 pg (ref 26.0–34.0)
MCHC: 33.2 g/dL (ref 30.0–36.0)
MCV: 92 fL (ref 80.0–100.0)
Monocytes Absolute: 1.6 10*3/uL — ABNORMAL HIGH (ref 0.1–1.0)
Monocytes Relative: 7 %
Neutro Abs: 3.1 10*3/uL (ref 1.7–7.7)
Neutrophils Relative %: 13 %
Platelets: 147 10*3/uL — ABNORMAL LOW (ref 150–400)
RBC: 4.12 MIL/uL — ABNORMAL LOW (ref 4.22–5.81)
RDW: 13 % (ref 11.5–15.5)
WBC: 24.6 10*3/uL — ABNORMAL HIGH (ref 4.0–10.5)
nRBC: 0 % (ref 0.0–0.2)

## 2019-04-28 LAB — LACTATE DEHYDROGENASE: LDH: 159 U/L (ref 98–192)

## 2019-04-28 NOTE — Telephone Encounter (Signed)
Scheduled appt per 1/28 los - pt is aware of appt date and time   

## 2019-05-25 DIAGNOSIS — Z1159 Encounter for screening for other viral diseases: Secondary | ICD-10-CM | POA: Diagnosis not present

## 2019-05-26 DIAGNOSIS — Z86010 Personal history of colonic polyps: Secondary | ICD-10-CM | POA: Diagnosis not present

## 2019-05-26 DIAGNOSIS — K5730 Diverticulosis of large intestine without perforation or abscess without bleeding: Secondary | ICD-10-CM | POA: Diagnosis not present

## 2019-05-26 DIAGNOSIS — D123 Benign neoplasm of transverse colon: Secondary | ICD-10-CM | POA: Diagnosis not present

## 2019-05-26 DIAGNOSIS — D122 Benign neoplasm of ascending colon: Secondary | ICD-10-CM | POA: Diagnosis not present

## 2019-05-29 DIAGNOSIS — D123 Benign neoplasm of transverse colon: Secondary | ICD-10-CM | POA: Diagnosis not present

## 2019-05-29 DIAGNOSIS — D122 Benign neoplasm of ascending colon: Secondary | ICD-10-CM | POA: Diagnosis not present

## 2019-07-13 DIAGNOSIS — I10 Essential (primary) hypertension: Secondary | ICD-10-CM | POA: Diagnosis not present

## 2019-07-13 DIAGNOSIS — I951 Orthostatic hypotension: Secondary | ICD-10-CM | POA: Diagnosis not present

## 2019-07-23 ENCOUNTER — Ambulatory Visit: Payer: Medicare Other | Attending: Internal Medicine

## 2019-07-23 DIAGNOSIS — Z23 Encounter for immunization: Secondary | ICD-10-CM | POA: Insufficient documentation

## 2019-07-23 NOTE — Progress Notes (Signed)
   Covid-19 Vaccination Clinic  Name:  Eric Harrington    MRN: [id number] DOB: 18-Feb-1941  07/23/2019  [name] was observed post Covid-19 immunization for 15 minutes without incident. He was provided with Vaccine Information Sheet and instruction to access the V-Safe system.   [name] was instructed to call 911 with any severe reactions post vaccine: Marland Kitchen Difficulty breathing  . Swelling of face and throat  . A fast heartbeat  . A bad rash all over body  . Dizziness and weakness   Immunizations Administered    Name Date Dose VIS Date Route   Pfizer COVID-19 Vaccine 07/23/2019  9:25 AM 0.3 mL 05/01/2019 Intramuscular   Manufacturer: [place]   Lot: [id number]   [place]: [id number]

## 2019-07-24 DIAGNOSIS — Z125 Encounter for screening for malignant neoplasm of prostate: Secondary | ICD-10-CM | POA: Diagnosis not present

## 2019-07-24 DIAGNOSIS — Z7984 Long term (current) use of oral hypoglycemic drugs: Secondary | ICD-10-CM | POA: Diagnosis not present

## 2019-07-24 DIAGNOSIS — Z1159 Encounter for screening for other viral diseases: Secondary | ICD-10-CM | POA: Diagnosis not present

## 2019-07-24 DIAGNOSIS — R109 Unspecified abdominal pain: Secondary | ICD-10-CM | POA: Diagnosis not present

## 2019-07-24 DIAGNOSIS — E785 Hyperlipidemia, unspecified: Secondary | ICD-10-CM | POA: Diagnosis not present

## 2019-07-24 DIAGNOSIS — C9190 Lymphoid leukemia, unspecified not having achieved remission: Secondary | ICD-10-CM | POA: Diagnosis not present

## 2019-07-24 DIAGNOSIS — I10 Essential (primary) hypertension: Secondary | ICD-10-CM | POA: Diagnosis not present

## 2019-07-24 DIAGNOSIS — Z0000 Encounter for general adult medical examination without abnormal findings: Secondary | ICD-10-CM | POA: Diagnosis not present

## 2019-07-24 DIAGNOSIS — E1122 Type 2 diabetes mellitus with diabetic chronic kidney disease: Secondary | ICD-10-CM | POA: Diagnosis not present

## 2019-07-28 DIAGNOSIS — L570 Actinic keratosis: Secondary | ICD-10-CM | POA: Diagnosis not present

## 2019-07-28 DIAGNOSIS — D1801 Hemangioma of skin and subcutaneous tissue: Secondary | ICD-10-CM | POA: Diagnosis not present

## 2019-07-28 DIAGNOSIS — L814 Other melanin hyperpigmentation: Secondary | ICD-10-CM | POA: Diagnosis not present

## 2019-07-28 DIAGNOSIS — L821 Other seborrheic keratosis: Secondary | ICD-10-CM | POA: Diagnosis not present

## 2019-07-28 DIAGNOSIS — X32XXXS Exposure to sunlight, sequela: Secondary | ICD-10-CM | POA: Diagnosis not present

## 2019-08-18 ENCOUNTER — Ambulatory Visit: Payer: Medicare Other | Attending: Internal Medicine

## 2019-08-18 DIAGNOSIS — Z23 Encounter for immunization: Secondary | ICD-10-CM

## 2019-08-18 NOTE — Progress Notes (Signed)
   Covid-19 Vaccination Clinic  Name:  Eric Harrington    MRN: [id number] DOB: 08-03-1940  08/18/2019  Mr. Eric Harrington was observed post Covid-19 immunization for 15 minutes without incident. He was provided with Vaccine Information Sheet and instruction to access the V-Safe system.   Mr. Eric Harrington was instructed to call 911 with any severe reactions post vaccine: Marland Kitchen Difficulty breathing  . Swelling of face and throat  . A fast heartbeat  . A bad rash all over body  . Dizziness and weakness   Immunizations Administered    Name Date Dose VIS Date Route   Pfizer COVID-19 Vaccine 08/18/2019  1:56 PM 0.3 mL 05/01/2019 Intramuscular   Manufacturer: [place]   Lot: [id number]   [place]: [id number]

## 2019-08-31 DIAGNOSIS — N183 Chronic kidney disease, stage 3 unspecified: Secondary | ICD-10-CM | POA: Diagnosis not present

## 2019-10-27 ENCOUNTER — Other Ambulatory Visit: Payer: Medicare Other

## 2019-10-27 ENCOUNTER — Ambulatory Visit: Payer: Medicare Other | Admitting: Hematology

## 2019-10-28 NOTE — Progress Notes (Signed)
HEMATOLOGY ONCOLOGY CLINIC NOTE  Date of Service:  10/29/2019   Patient Care Team: [name], MD as PCP - General (Family Medicine)  CHIEF COMPLAINTS/PURPOSE OF CONSULTATION:  F/u for CLL  HISTORY OF PRESENTING ILLNESS:   Eric Harrington 79 y.o. male is here because of a referral from Dr. Inda Castle from [place] [facility] regarding a trend in his elevated WBC.   He is accompanied today by his wife of 75 years. The pt reports that he is doing well overall. He recently had a biopsy to check his prostate at [facility] with Dr. Aleen Campi and they took 12 core samples with reportedly no significant findings.  Of note prior to the patient's visit today, pt has had CBC completed on 04/26/17 with results revealing WBC at 14.7, Hgb at 12.6, Lymph Abs at 11.0 with all other values WNL. On 03/26/17 his WBC were 12.8, Hgb at 13.3 and Lymph's at  9.90. On 02/06/17 his WBC were 12.7, Hgb at 13.4, and Lymph's at 8.80.   On review of systems, pt reports post nasal drip, persistent cough, occasional troublesome stomach, pain in his lower abdomen that feels like his muscles are irritated, reports he has lost 25 lbs in the last 2.5 years (he associates a decreased appetite after taking beta blockers during this period) and denies no recent colds or infections, acute changes in energy levels, and leg swelling.   On PMHx the pt reports taking Zetia. He has IB'S and has had 3 colonoscopies with no significant findings or inflammatory processes. He reports that over the last 2.5 years he had heart catheterizations that all turned out well. He reports having diabetes. He also reports neuropathy along his whole right side that lasted for a few months that ceased abruptly.   Interval History:   Eric Harrington returns for management and evaluation of his CLL. We are joined today by Mrs. Rauth. The patient's last visit with [place] was on 04/28/2019. The pt reports that he is doing well overall.  The pt reports he  is good. He has been having dizzy and weak spells. When the pt stands his blood pressure drops and when he is sitting his blood pressure goes back to normal early in the morning. He doesn't feel that using compression socks helped. His weakness comes as a spell and is not constant. He is trying to drink more water in a day. Pt has been seeing the dermatologist.   Lab results today (10/29/19) of CBC w/diff and CMP is as follows: all values are WNL except for WBC at 24.6K, Lymph Abs at 20.3K, Monocytes Absolute at 1.2K, Glucose at 178, Creatinine at 1.37, AST at 12, GFR, Est Non Af Am at 50, GFR, Est AFR Am at 58 06/10/2 of LDH at 123: WNL 10/29/19 of MMP is as follows: all values are WNL  On review of systems, pt reports fatigue, dissy and denies fever, chills, night sweats, skin spots, back pain, abdominal pain and any other symptoms.   MEDICAL HISTORY:  1. HTN 2. DM2 3. H/o PAC's 4. transient a fib with cardiac cath 5. HLD 6. CKD stage 3 7. Irritable bowel syndrome 8. GERD 9. H/o presyndope  SURGICAL HISTORY:  1. Prostate bx 2. Cardiac cath  SOCIAL HISTORY: Social History   Socioeconomic History  . Marital status: Married    Spouse name: Not on file  . Number of children: 2  . Years of education: Not on file  . Highest education level: Not  on file  Occupational History  . Not on file  Tobacco Use  . Smoking status: Former Smoker    Packs/day: 1.00    Types: Cigarettes    Quit date: 1980    Years since quitting: 41.4  . Smokeless tobacco: Never Used  . Tobacco comment: started at age 28  Vaping Use  . Vaping Use: Never used  Substance and Sexual Activity  . Alcohol use: Never  . Drug use: Not Currently  . Sexual activity: Not on file  Other Topics Concern  . Not on file  Social History Narrative  . Not on file   Social Determinants of Health   Financial Resource Strain:   . Difficulty of Paying Living Expenses:   Food Insecurity:   . Worried About Paediatric nurse in the Last Year:   . Arboriculturist in the Last Year:   Transportation Needs:   . Film/video editor (Medical):   Marland Kitchen Lack of Transportation (Non-Medical):   Physical Activity:   . Days of Exercise per Week:   . Minutes of Exercise per Session:   Stress:   . Feeling of Stress :   Social Connections:   . Frequency of Communication with Friends and Family:   . Frequency of Social Gatherings with Friends and Family:   . Attends Religious Services:   . Active Member of Clubs or Organizations:   . Attends Archivist Meetings:   Marland Kitchen Marital Status:   Intimate Partner Violence:   . Fear of Current or Ex-Partner:   . Emotionally Abused:   Marland Kitchen Physically Abused:   . Sexually Abused:     FAMILY HISTORY: Family History  Problem Relation Age of Onset  . Lung cancer Mother   . Heart attack Father   . Dementia Sister   . Diabetes Brother   . Diabetes Sister   . Heart attack Brother     ALLERGIES:  is allergic to bystolic [nebivolol hcl], cephalexin, gemfibrozil, glipizide, januvia [sitagliptin], lipitor [atorvastatin calcium], lisinopril, losartan, metoprolol, omeprazole, simvastatin, and gabapentin.  MEDICATIONS:  Current Outpatient Medications  Medication Sig Dispense Refill  . AMBULATORY NON FORMULARY MEDICATION Take by mouth 2 (two) times daily. Medication Name: Immuneti Advanced Immune Defense    . Ascorbic Acid (VITAMIN C) 1000 MG tablet Take 1,000 mg by mouth 2 (two) times a day.    Marland Kitchen aspirin EC 81 MG tablet Take 81 mg by mouth daily.    . B Complex Vitamins (VITAMIN-B COMPLEX PO) Take by mouth.    . carboxymethylcellulose (REFRESH PLUS) 0.5 % SOLN 1 drop 3 (three) times daily as needed.    . docusate sodium (COLACE) 100 MG capsule Take 100 mg by mouth daily.     Marland Kitchen esomeprazole (NEXIUM) 20 MG capsule Take 20 mg by mouth daily at 12 noon.    . Glucose Blood (IGLUCOSE TEST STRIPS VI) by In Vitro route.    . Homeopathic Products (ALLERGY MEDICINE PO) Take  by mouth.    . Lancets MISC by Does not apply route.    Marland Kitchen losartan (COZAAR) 25 MG tablet Take 25 mg by mouth daily after supper.    . metFORMIN (GLUCOPHAGE) 1000 MG tablet Take 500 mg by mouth 2 (two) times daily.     . Riboflavin (VITAMIN B-2 PO) Take by mouth daily.    . Rosuvastatin Calcium 5 MG CPSP Take by mouth daily. 1/2 tab daily    . VITAMIN D PO Take by mouth  daily.     No current facility-administered medications for this visit.    REVIEW OF SYSTEMS:   A 10+ POINT REVIEW OF SYSTEMS WAS OBTAINED including neurology, dermatology, psychiatry, cardiac, respiratory, lymph, extremities, GI, GU, Musculoskeletal, constitutional, breasts, reproductive, HEENT.  All pertinent positives are noted in the HPI.  All others are negative.   PHYSICAL EXAMINATION:  Vitals:   10/29/19 1420  BP: 120/75  Pulse: 88  Resp: 18  Temp: 97.7 F (36.5 C)  SpO2: 98%   Filed Weights   10/29/19 1420  Weight: 182 lb 6.4 oz (82.7 kg)    GENERAL:alert, in no acute distress and comfortable SKIN: no acute rashes, no significant lesions EYES: conjunctiva are pink and non-injected, sclera anicteric OROPHARYNX: MMM, no exudates, no oropharyngeal erythema or ulceration NECK: supple, no JVD LYMPH:  no palpable lymphadenopathy in the cervical, axillary or inguinal regions LUNGS: clear to auscultation b/l with normal respiratory effort HEART: regular rate & rhythm ABDOMEN:  normoactive bowel sounds , non tender, not distended. Extremity: no pedal edema PSYCH: alert & oriented x 3 with fluent speech NEURO: no focal motor/sensory deficits  LABORATORY DATA:  I have reviewed the data as listed  . CBC Latest Ref Rng & Units 10/29/2019 04/28/2019 10/27/2018  WBC 4.0 - 10.5 K/uL 24.6(H) 24.6(H) 24.2(H)  Hemoglobin 13.0 - 17.0 g/dL 13.0 12.6(L) 12.9(L)  Hematocrit 39 - 52 % 39.9 37.9(L) 39.1  Platelets 150 - 400 K/uL 150 147(L) 175    . CBC    Component Value Date/Time   WBC 24.6 (H) 10/29/2019 1331    RBC 4.33 10/29/2019 1331   HGB 13.0 10/29/2019 1331   HGB 13.9 08/28/2017 0937   HCT 39.9 10/29/2019 1331   PLT 150 10/29/2019 1331   PLT 171 08/28/2017 0937   MCV 92.1 10/29/2019 1331   MCH 30.0 10/29/2019 1331   MCHC 32.6 10/29/2019 1331   RDW 12.7 10/29/2019 1331   LYMPHSABS 20.3 (H) 10/29/2019 1331   MONOABS 1.2 (H) 10/29/2019 1331   EOSABS 0.0 10/29/2019 1331   BASOSABS 0.1 10/29/2019 1331   .Marland Kitchen Lab Results  Component Value Date   RETICCTPCT 1.4 04/24/2018   RBC 4.33 10/29/2019    CMP Latest Ref Rng & Units 10/29/2019 04/28/2019 10/27/2018  Glucose 70 - 99 mg/dL 178(H) 179(H) 151(H)  BUN 8 - 23 mg/dL 16 16 19   Creatinine 0.61 - 1.24 mg/dL 1.37(H) 1.45(H) 1.57(H)  Sodium 135 - 145 mmol/L 135 137 136  Potassium 3.5 - 5.1 mmol/L 4.2 4.1 4.5  Chloride 98 - 111 mmol/L 103 101 103  CO2 22 - 32 mmol/L 24 24 22   Calcium 8.9 - 10.3 mg/dL 9.7 9.0 9.3  Total Protein 6.5 - 8.1 g/dL 6.8 6.7 7.0  Total Bilirubin 0.3 - 1.2 mg/dL 0.3 0.4 0.4  Alkaline Phos 38 - 126 U/L 109 92 95  AST 15 - 41 U/L 12(L) 14(L) 14(L)  ALT 0 - 44 U/L 16 20 17    . Lab Results  Component Value Date   LDH 123 10/29/2019   Component     Latest Ref Rng & Units 06/13/2017  HCV Ab     0.0 - 0.9 s/co ratio <0.1  Hepatitis B Surface Ag     Negative Negative  Hep B Core Ab, Tot     Negative Negative         RADIOGRAPHIC STUDIES: I have personally reviewed the radiological images as listed and agreed with the findings in the report. No results found.  ASSESSMENT & PLAN:   79 y.o. is a male with  1. Recently diagnosed Chronic lymphocytic leukemia. Likely Rai Stage 0  -he presented with Lymphocytosis incidentally noted on routine labs No associated significant anemia or thrombocytopenia. No constitutional symptoms No overt clinically palpable LNadenopathy or hepato-splenomegaly. 13q mutation present   08/28/17 CT C/A/P revealed Normal sized spleen, no enlarged lymph nodes. There is evidence of  very small lung nodule, likely related to inflammatory process. Will monitor with CT chest in 12 months   PLAN:  -Discussed pt labwork today, 10/29/19; of CBC w/diff and CMP is as follows: all values are WNL except for WBC at 24.6K, Lymph Abs at 20.3K, Monocytes Absolute at 1.2K, Glucose at 178, Creatinine at 1.37, AST at 12, GFR, Est Non Af Am at 50, GFR, Est AFR Am at 58 -Discussed 06/10/2 of LDH at 123: WNL -No palpable lymphadenopathy or splenomegaly upon examination -The pt shows no overt clinical or lab progression of his CLL at this time.  -No indication to initiate treatment at this time. -Advised on orthostatic hypotension -keep legs moving, compression socks  -Recommends staying hydrated  -Recommends increasing salt intake and eating well  -Continue f/u with Nephrologist for CKD  -Continue Vitamin B complex -Recommend continue skin screening with dermatology given the risk of non melanoma skin cancers, the pt notes he will do this -Previously recommended pt being up to date on his vaccines; and he's had both pneumonia vaccines and his flu shot.  -Will alternate visits with PCP per patients request. -Will see the pt back in 1 year, sooner if any new concerns -f/u with PCP in 6 months  FOLLOW UP: RTC with Dr Irene Limbo with labs in 12 months   The total time spent in the appt was 20 minutes and more than 50% was on counseling and direct patient cares.  All of the patient's questions were answered with apparent satisfaction. The patient knows to call the clinic with any problems, questions or concerns.  Sullivan Lone MD [place] AAHIVMS [facility] Hematology/Oncology Physician [facility]  (Office):       [phone] (Work cell):  [phone] (Fax):           [phone]  I, Dawayne Cirri am acting as a scribe for Dr. Sullivan Lone.   .I have reviewed the above documentation for accuracy and completeness, and I agree with the above. Brunetta Genera MD

## 2019-10-29 ENCOUNTER — Inpatient Hospital Stay: Payer: Medicare Other | Attending: Hematology

## 2019-10-29 ENCOUNTER — Other Ambulatory Visit: Payer: Self-pay

## 2019-10-29 ENCOUNTER — Inpatient Hospital Stay (HOSPITAL_BASED_OUTPATIENT_CLINIC_OR_DEPARTMENT_OTHER): Payer: Medicare Other | Admitting: Hematology

## 2019-10-29 ENCOUNTER — Telehealth: Payer: Self-pay | Admitting: Hematology

## 2019-10-29 VITALS — BP 120/75 | HR 88 | Temp 97.7°F | Resp 18 | Ht 70.0 in | Wt 182.4 lb

## 2019-10-29 DIAGNOSIS — E119 Type 2 diabetes mellitus without complications: Secondary | ICD-10-CM | POA: Insufficient documentation

## 2019-10-29 DIAGNOSIS — N183 Chronic kidney disease, stage 3 unspecified: Secondary | ICD-10-CM | POA: Insufficient documentation

## 2019-10-29 DIAGNOSIS — R05 Cough: Secondary | ICD-10-CM | POA: Diagnosis not present

## 2019-10-29 DIAGNOSIS — Z79899 Other long term (current) drug therapy: Secondary | ICD-10-CM | POA: Diagnosis not present

## 2019-10-29 DIAGNOSIS — Z87891 Personal history of nicotine dependence: Secondary | ICD-10-CM | POA: Diagnosis not present

## 2019-10-29 DIAGNOSIS — Z801 Family history of malignant neoplasm of trachea, bronchus and lung: Secondary | ICD-10-CM | POA: Insufficient documentation

## 2019-10-29 DIAGNOSIS — R531 Weakness: Secondary | ICD-10-CM | POA: Insufficient documentation

## 2019-10-29 DIAGNOSIS — K589 Irritable bowel syndrome without diarrhea: Secondary | ICD-10-CM | POA: Insufficient documentation

## 2019-10-29 DIAGNOSIS — C9110 Chronic lymphocytic leukemia of B-cell type not having achieved remission: Secondary | ICD-10-CM | POA: Diagnosis not present

## 2019-10-29 DIAGNOSIS — G629 Polyneuropathy, unspecified: Secondary | ICD-10-CM | POA: Insufficient documentation

## 2019-10-29 DIAGNOSIS — K219 Gastro-esophageal reflux disease without esophagitis: Secondary | ICD-10-CM | POA: Insufficient documentation

## 2019-10-29 DIAGNOSIS — I129 Hypertensive chronic kidney disease with stage 1 through stage 4 chronic kidney disease, or unspecified chronic kidney disease: Secondary | ICD-10-CM | POA: Diagnosis not present

## 2019-10-29 DIAGNOSIS — R0982 Postnasal drip: Secondary | ICD-10-CM | POA: Insufficient documentation

## 2019-10-29 DIAGNOSIS — E785 Hyperlipidemia, unspecified: Secondary | ICD-10-CM | POA: Diagnosis not present

## 2019-10-29 DIAGNOSIS — I4891 Unspecified atrial fibrillation: Secondary | ICD-10-CM | POA: Diagnosis not present

## 2019-10-29 LAB — CBC WITH DIFFERENTIAL/PLATELET
Abs Immature Granulocytes: 0.03 10*3/uL (ref 0.00–0.07)
Basophils Absolute: 0.1 10*3/uL (ref 0.0–0.1)
Basophils Relative: 0 %
Eosinophils Absolute: 0 10*3/uL (ref 0.0–0.5)
Eosinophils Relative: 0 %
HCT: 39.9 % (ref 39.0–52.0)
Hemoglobin: 13 g/dL (ref 13.0–17.0)
Immature Granulocytes: 0 %
Lymphocytes Relative: 83 %
Lymphs Abs: 20.3 10*3/uL — ABNORMAL HIGH (ref 0.7–4.0)
MCH: 30 pg (ref 26.0–34.0)
MCHC: 32.6 g/dL (ref 30.0–36.0)
MCV: 92.1 fL (ref 80.0–100.0)
Monocytes Absolute: 1.2 10*3/uL — ABNORMAL HIGH (ref 0.1–1.0)
Monocytes Relative: 5 %
Neutro Abs: 3 10*3/uL (ref 1.7–7.7)
Neutrophils Relative %: 12 %
Platelets: 150 10*3/uL (ref 150–400)
RBC: 4.33 MIL/uL (ref 4.22–5.81)
RDW: 12.7 % (ref 11.5–15.5)
WBC: 24.6 10*3/uL — ABNORMAL HIGH (ref 4.0–10.5)
nRBC: 0 % (ref 0.0–0.2)

## 2019-10-29 LAB — CMP (CANCER CENTER ONLY)
ALT: 16 U/L (ref 0–44)
AST: 12 U/L — ABNORMAL LOW (ref 15–41)
Albumin: 4.2 g/dL (ref 3.5–5.0)
Alkaline Phosphatase: 109 U/L (ref 38–126)
Anion gap: 8 (ref 5–15)
BUN: 16 mg/dL (ref 8–23)
CO2: 24 mmol/L (ref 22–32)
Calcium: 9.7 mg/dL (ref 8.9–10.3)
Chloride: 103 mmol/L (ref 98–111)
Creatinine: 1.37 mg/dL — ABNORMAL HIGH (ref 0.61–1.24)
GFR, Est AFR Am: 58 mL/min — ABNORMAL LOW (ref >60)
GFR, Estimated: 50 mL/min — ABNORMAL LOW (ref >60)
Glucose, Bld: 178 mg/dL — ABNORMAL HIGH (ref 70–99)
Potassium: 4.2 mmol/L (ref 3.5–5.1)
Sodium: 135 mmol/L (ref 135–145)
Total Bilirubin: 0.3 mg/dL (ref 0.3–1.2)
Total Protein: 6.8 g/dL (ref 6.5–8.1)

## 2019-10-29 LAB — LACTATE DEHYDROGENASE: LDH: 123 U/L (ref 98–192)

## 2019-10-29 NOTE — Telephone Encounter (Signed)
Scheduled appt per 6/10 los - gave patient AVS and calender per los.

## 2019-11-02 LAB — MULTIPLE MYELOMA PANEL, SERUM
Albumin SerPl Elph-Mcnc: 3.9 g/dL (ref 2.9–4.4)
Albumin/Glob SerPl: 1.5 (ref 0.7–1.7)
Alpha 1: 0.2 g/dL (ref 0.0–0.4)
Alpha2 Glob SerPl Elph-Mcnc: 0.9 g/dL (ref 0.4–1.0)
B-Globulin SerPl Elph-Mcnc: 1 g/dL (ref 0.7–1.3)
Gamma Glob SerPl Elph-Mcnc: 0.7 g/dL (ref 0.4–1.8)
Globulin, Total: 2.7 g/dL (ref 2.2–3.9)
IgA: 103 mg/dL (ref 61–437)
IgG (Immunoglobin G), Serum: 657 mg/dL (ref 603–1613)
IgM (Immunoglobulin M), Srm: 21 mg/dL (ref 15–143)
Total Protein ELP: 6.6 g/dL (ref 6.0–8.5)

## 2019-12-02 DIAGNOSIS — N183 Chronic kidney disease, stage 3 unspecified: Secondary | ICD-10-CM | POA: Diagnosis not present

## 2019-12-17 DIAGNOSIS — E559 Vitamin D deficiency, unspecified: Secondary | ICD-10-CM | POA: Diagnosis not present

## 2019-12-17 DIAGNOSIS — D892 Hypergammaglobulinemia, unspecified: Secondary | ICD-10-CM | POA: Diagnosis not present

## 2019-12-17 DIAGNOSIS — I129 Hypertensive chronic kidney disease with stage 1 through stage 4 chronic kidney disease, or unspecified chronic kidney disease: Secondary | ICD-10-CM | POA: Diagnosis not present

## 2019-12-17 DIAGNOSIS — N183 Chronic kidney disease, stage 3 unspecified: Secondary | ICD-10-CM | POA: Diagnosis not present

## 2019-12-17 DIAGNOSIS — C9110 Chronic lymphocytic leukemia of B-cell type not having achieved remission: Secondary | ICD-10-CM | POA: Diagnosis not present

## 2020-02-03 DIAGNOSIS — E119 Type 2 diabetes mellitus without complications: Secondary | ICD-10-CM | POA: Diagnosis not present

## 2020-02-03 DIAGNOSIS — H2513 Age-related nuclear cataract, bilateral: Secondary | ICD-10-CM | POA: Diagnosis not present

## 2020-02-03 DIAGNOSIS — H43813 Vitreous degeneration, bilateral: Secondary | ICD-10-CM | POA: Diagnosis not present

## 2020-02-03 DIAGNOSIS — H04123 Dry eye syndrome of bilateral lacrimal glands: Secondary | ICD-10-CM | POA: Diagnosis not present

## 2020-02-17 DIAGNOSIS — H2513 Age-related nuclear cataract, bilateral: Secondary | ICD-10-CM | POA: Diagnosis not present

## 2020-02-17 DIAGNOSIS — H2511 Age-related nuclear cataract, right eye: Secondary | ICD-10-CM | POA: Diagnosis not present

## 2020-03-01 DIAGNOSIS — H2511 Age-related nuclear cataract, right eye: Secondary | ICD-10-CM | POA: Diagnosis not present

## 2020-03-01 DIAGNOSIS — H25811 Combined forms of age-related cataract, right eye: Secondary | ICD-10-CM | POA: Diagnosis not present

## 2020-03-10 DIAGNOSIS — H2512 Age-related nuclear cataract, left eye: Secondary | ICD-10-CM | POA: Diagnosis not present

## 2020-03-11 DIAGNOSIS — E785 Hyperlipidemia, unspecified: Secondary | ICD-10-CM | POA: Diagnosis not present

## 2020-03-11 DIAGNOSIS — Z794 Long term (current) use of insulin: Secondary | ICD-10-CM | POA: Diagnosis not present

## 2020-03-11 DIAGNOSIS — Z23 Encounter for immunization: Secondary | ICD-10-CM | POA: Diagnosis not present

## 2020-03-11 DIAGNOSIS — I10 Essential (primary) hypertension: Secondary | ICD-10-CM | POA: Diagnosis not present

## 2020-03-11 DIAGNOSIS — I2510 Atherosclerotic heart disease of native coronary artery without angina pectoris: Secondary | ICD-10-CM | POA: Diagnosis not present

## 2020-03-11 DIAGNOSIS — E1122 Type 2 diabetes mellitus with diabetic chronic kidney disease: Secondary | ICD-10-CM | POA: Diagnosis not present

## 2020-03-22 DIAGNOSIS — H2512 Age-related nuclear cataract, left eye: Secondary | ICD-10-CM | POA: Diagnosis not present

## 2020-04-13 IMAGING — US US RENAL
1 series · 14 of 25 positions shown · non-contrast
Comparison: None.

CLINICAL DATA: Chronic kidney disease stage 3.

EXAM:
RENAL / URINARY TRACT ULTRASOUND COMPLETE

[Series 1: us renal · 0.23mm/px · 14 of 30 slices shown]
[im 1/30]
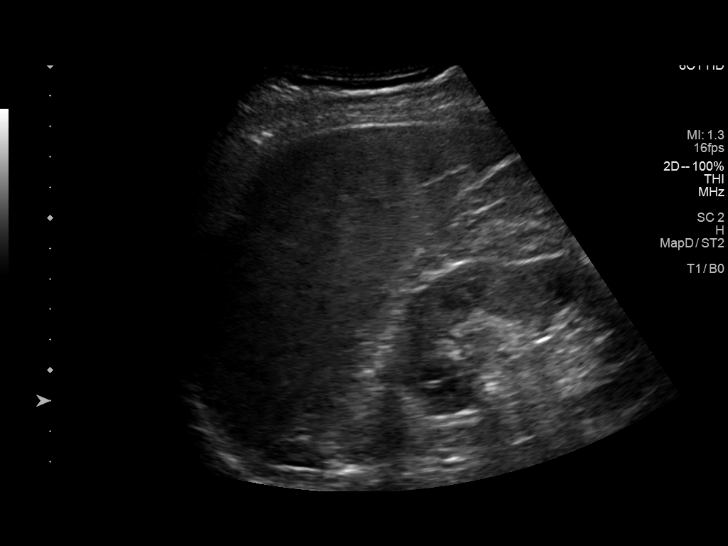
[im 3/30]
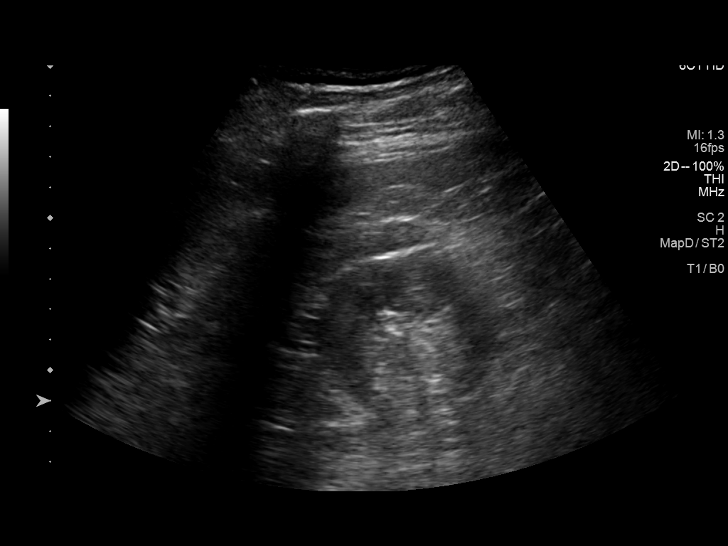
[im 5/30]
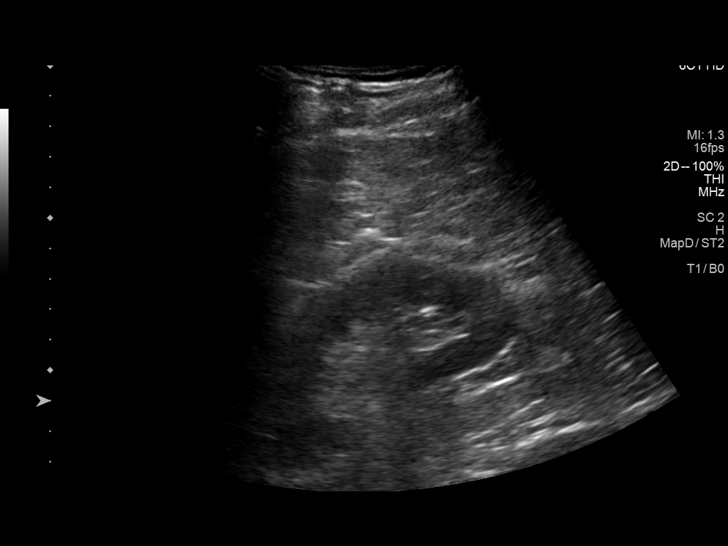
[im 8/30]
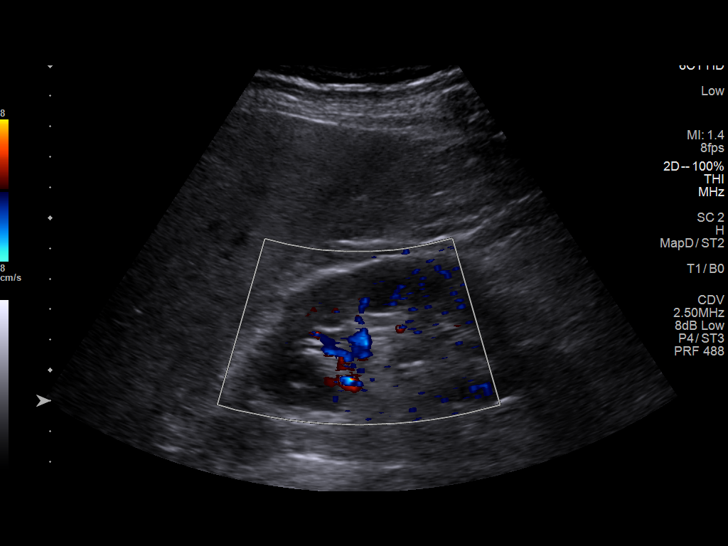
[im 10/30]
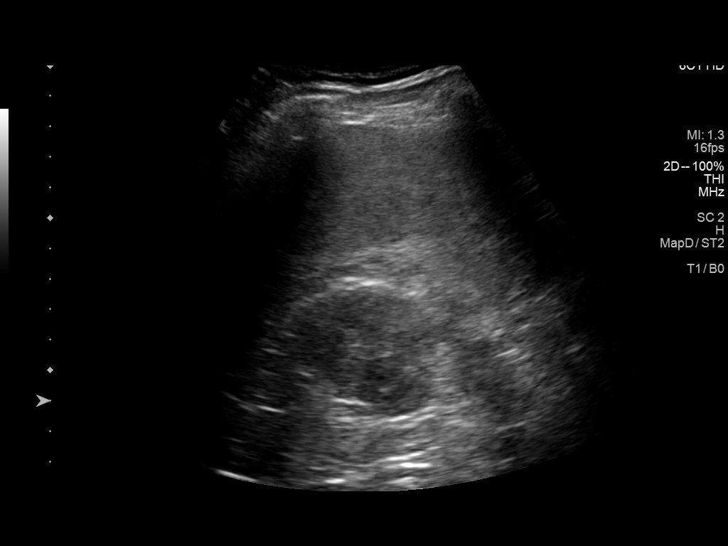
[im 11/30]
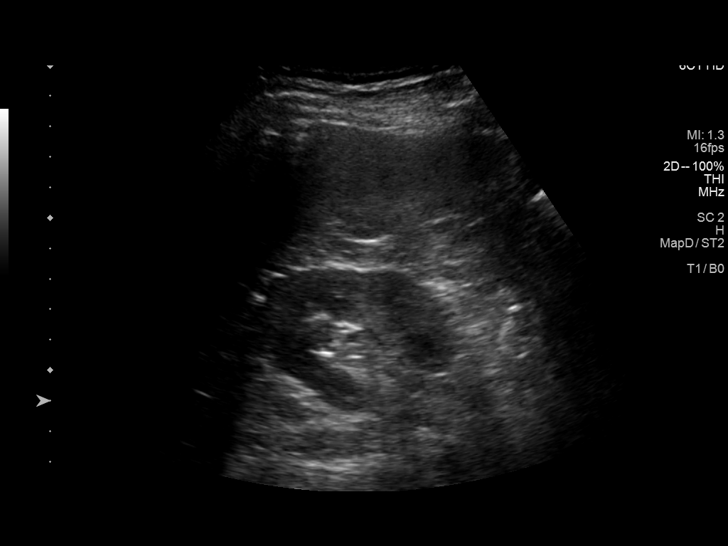
[im 14/30]
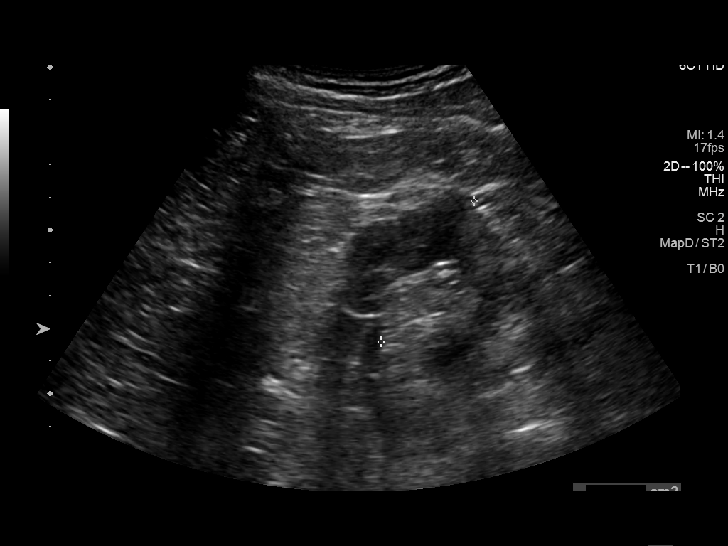
[im 16/30]
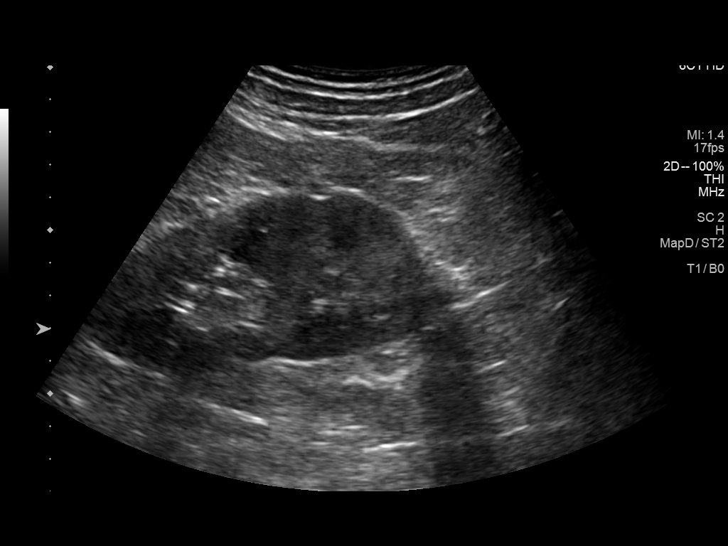
[im 19/30]
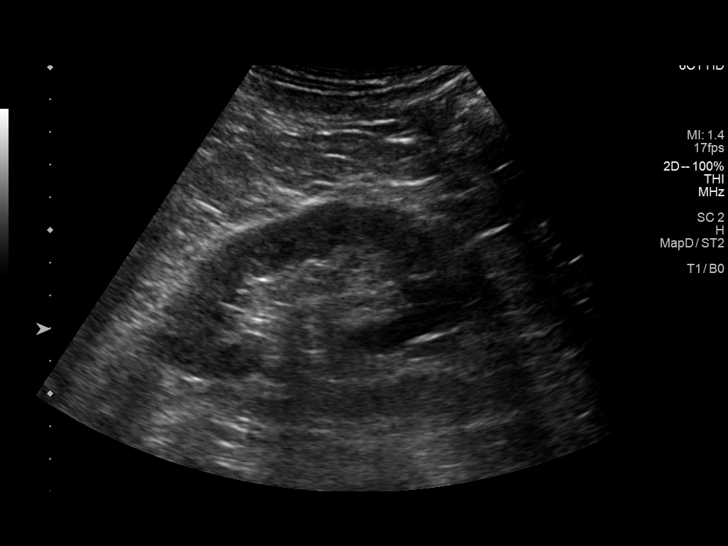
[im 20/30]
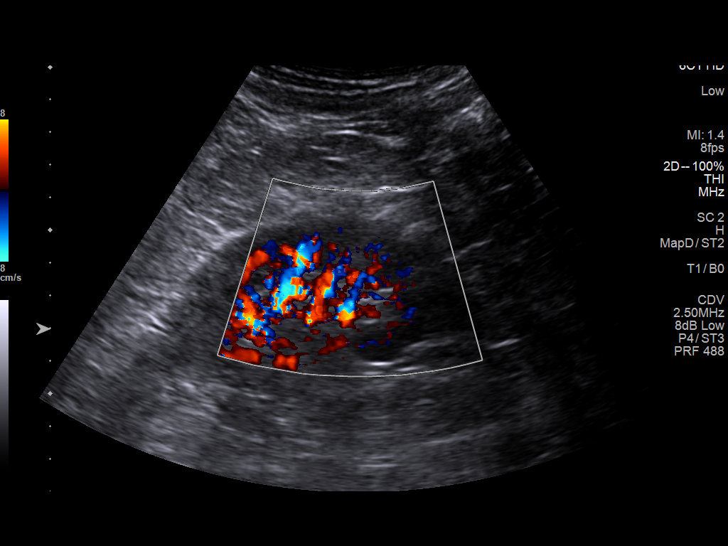
[im 22/30]
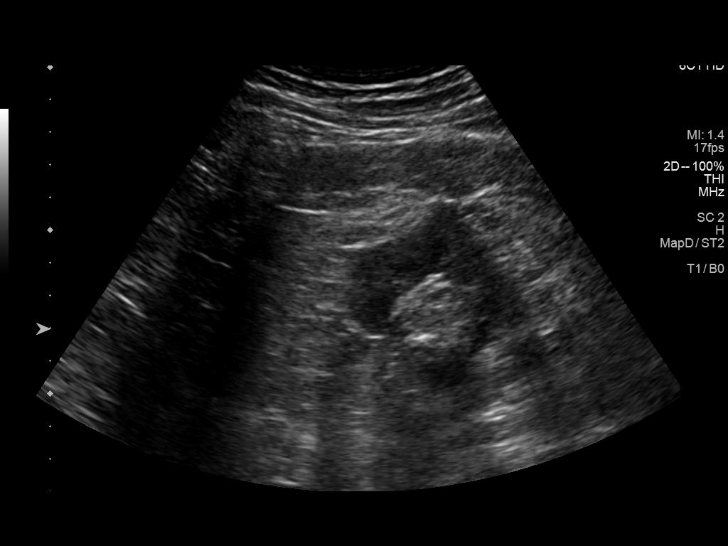
[im 25/30]
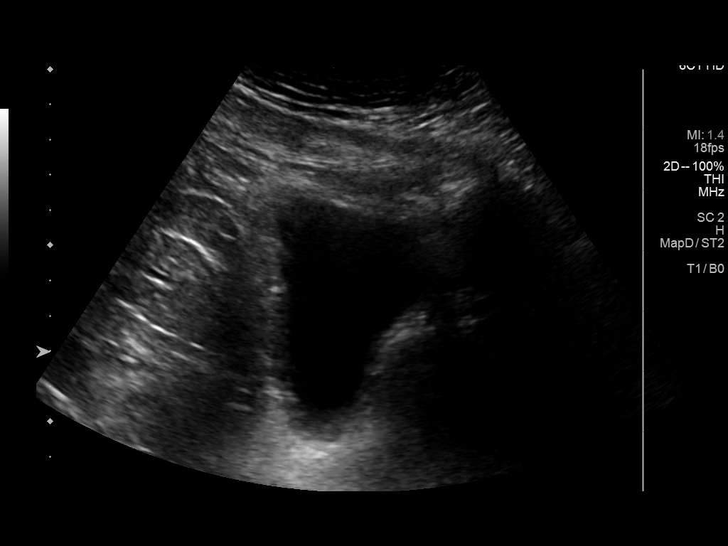
[im 27/30]
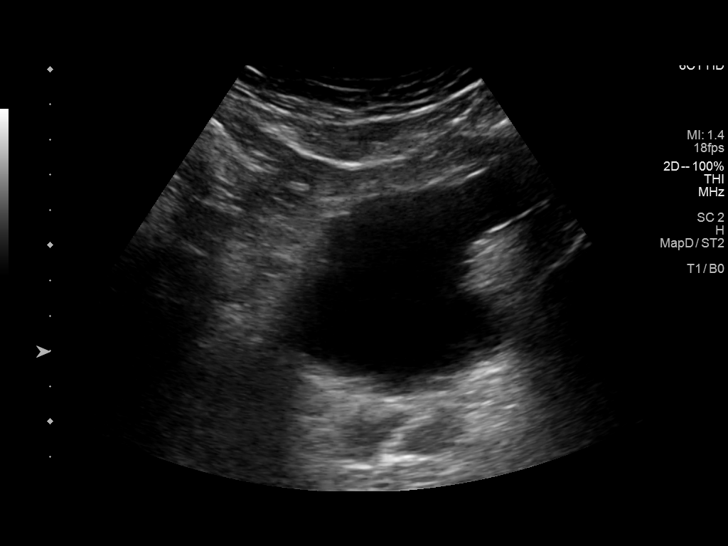
[im 30/30]
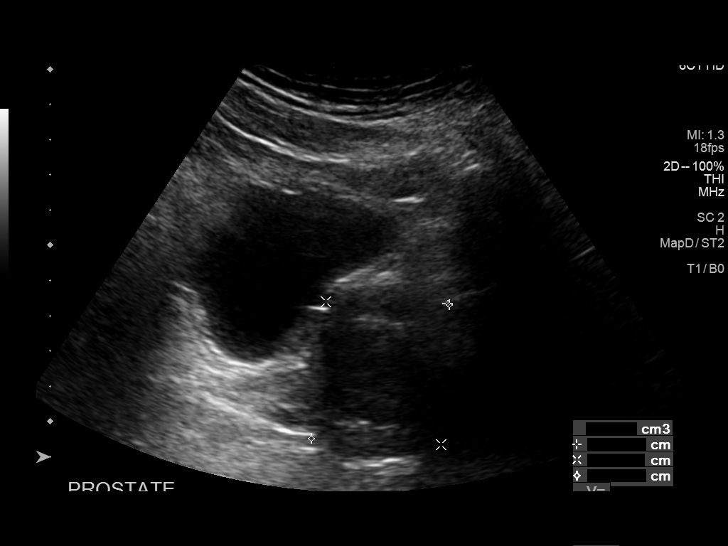

[14 of 25 positions shown; findings below may reference images not displayed]

FINDINGS: Right Kidney:

Renal measurements: 10.9 x 5.2 x 4.7 cm = volume: 140 mL .
Echogenicity within normal limits. No mass or hydronephrosis
visualized.

Left Kidney:

Renal measurements: 11.3 x 4.9 x 5.2 cm = volume: 115 mL.
Echogenicity within normal limits. No mass or hydronephrosis
visualized.

Bladder:

Appears normal for degree of bladder distention. Mild prostatic
enlargement is noted.
IMPRESSION: Normal renal ultrasound.  Mild prostatic enlargement.

## 2020-05-26 DIAGNOSIS — J019 Acute sinusitis, unspecified: Secondary | ICD-10-CM | POA: Diagnosis not present

## 2020-06-10 DIAGNOSIS — N183 Chronic kidney disease, stage 3 unspecified: Secondary | ICD-10-CM | POA: Diagnosis not present

## 2020-06-10 DIAGNOSIS — C9190 Lymphoid leukemia, unspecified not having achieved remission: Secondary | ICD-10-CM | POA: Diagnosis not present

## 2020-06-10 DIAGNOSIS — H6122 Impacted cerumen, left ear: Secondary | ICD-10-CM | POA: Diagnosis not present

## 2020-06-10 DIAGNOSIS — I10 Essential (primary) hypertension: Secondary | ICD-10-CM | POA: Diagnosis not present

## 2020-06-10 DIAGNOSIS — E785 Hyperlipidemia, unspecified: Secondary | ICD-10-CM | POA: Diagnosis not present

## 2020-06-10 DIAGNOSIS — J019 Acute sinusitis, unspecified: Secondary | ICD-10-CM | POA: Diagnosis not present

## 2020-06-10 DIAGNOSIS — I2510 Atherosclerotic heart disease of native coronary artery without angina pectoris: Secondary | ICD-10-CM | POA: Diagnosis not present

## 2020-06-10 DIAGNOSIS — E1122 Type 2 diabetes mellitus with diabetic chronic kidney disease: Secondary | ICD-10-CM | POA: Diagnosis not present

## 2020-06-17 DIAGNOSIS — E559 Vitamin D deficiency, unspecified: Secondary | ICD-10-CM | POA: Diagnosis not present

## 2020-06-17 DIAGNOSIS — C9110 Chronic lymphocytic leukemia of B-cell type not having achieved remission: Secondary | ICD-10-CM | POA: Diagnosis not present

## 2020-06-17 DIAGNOSIS — I129 Hypertensive chronic kidney disease with stage 1 through stage 4 chronic kidney disease, or unspecified chronic kidney disease: Secondary | ICD-10-CM | POA: Diagnosis not present

## 2020-06-17 DIAGNOSIS — D892 Hypergammaglobulinemia, unspecified: Secondary | ICD-10-CM | POA: Diagnosis not present

## 2020-06-17 DIAGNOSIS — N1831 Chronic kidney disease, stage 3a: Secondary | ICD-10-CM | POA: Diagnosis not present

## 2020-06-17 DIAGNOSIS — N183 Chronic kidney disease, stage 3 unspecified: Secondary | ICD-10-CM | POA: Diagnosis not present

## 2020-06-23 DIAGNOSIS — I781 Nevus, non-neoplastic: Secondary | ICD-10-CM | POA: Diagnosis not present

## 2020-06-23 DIAGNOSIS — L578 Other skin changes due to chronic exposure to nonionizing radiation: Secondary | ICD-10-CM | POA: Diagnosis not present

## 2020-06-23 DIAGNOSIS — L570 Actinic keratosis: Secondary | ICD-10-CM | POA: Diagnosis not present

## 2020-06-23 DIAGNOSIS — L720 Epidermal cyst: Secondary | ICD-10-CM | POA: Diagnosis not present

## 2020-07-28 DIAGNOSIS — C9190 Lymphoid leukemia, unspecified not having achieved remission: Secondary | ICD-10-CM | POA: Diagnosis not present

## 2020-07-28 DIAGNOSIS — E785 Hyperlipidemia, unspecified: Secondary | ICD-10-CM | POA: Diagnosis not present

## 2020-07-28 DIAGNOSIS — Z23 Encounter for immunization: Secondary | ICD-10-CM | POA: Diagnosis not present

## 2020-07-28 DIAGNOSIS — N183 Chronic kidney disease, stage 3 unspecified: Secondary | ICD-10-CM | POA: Diagnosis not present

## 2020-07-28 DIAGNOSIS — I10 Essential (primary) hypertension: Secondary | ICD-10-CM | POA: Diagnosis not present

## 2020-07-28 DIAGNOSIS — Z0000 Encounter for general adult medical examination without abnormal findings: Secondary | ICD-10-CM | POA: Diagnosis not present

## 2020-07-28 DIAGNOSIS — E1122 Type 2 diabetes mellitus with diabetic chronic kidney disease: Secondary | ICD-10-CM | POA: Diagnosis not present

## 2020-08-15 DIAGNOSIS — J019 Acute sinusitis, unspecified: Secondary | ICD-10-CM | POA: Diagnosis not present

## 2020-09-14 ENCOUNTER — Telehealth: Payer: Self-pay | Admitting: Hematology

## 2020-09-14 NOTE — Telephone Encounter (Signed)
R/S per 4/27 los, Patient aware

## 2020-09-15 DIAGNOSIS — E1122 Type 2 diabetes mellitus with diabetic chronic kidney disease: Secondary | ICD-10-CM | POA: Diagnosis not present

## 2020-09-15 DIAGNOSIS — E785 Hyperlipidemia, unspecified: Secondary | ICD-10-CM | POA: Diagnosis not present

## 2020-10-28 ENCOUNTER — Ambulatory Visit: Payer: Medicare Other | Admitting: Hematology

## 2020-10-28 ENCOUNTER — Other Ambulatory Visit: Payer: Medicare Other

## 2020-11-03 DIAGNOSIS — E119 Type 2 diabetes mellitus without complications: Secondary | ICD-10-CM | POA: Diagnosis not present

## 2020-11-03 DIAGNOSIS — H04123 Dry eye syndrome of bilateral lacrimal glands: Secondary | ICD-10-CM | POA: Diagnosis not present

## 2020-11-03 DIAGNOSIS — D23122 Other benign neoplasm of skin of left lower eyelid, including canthus: Secondary | ICD-10-CM | POA: Diagnosis not present

## 2020-11-03 DIAGNOSIS — H11443 Conjunctival cysts, bilateral: Secondary | ICD-10-CM | POA: Diagnosis not present

## 2020-11-24 NOTE — Progress Notes (Signed)
HEMATOLOGY ONCOLOGY CLINIC NOTE  Date of Service:  11/25/2020   Patient Care Team: [name], MD as PCP - General (Family Medicine)  CHIEF COMPLAINTS/PURPOSE OF CONSULTATION:  F/u for CLL  HISTORY OF PRESENTING ILLNESS:   Eric Harrington 80 y.o. male is here because of a referral from Dr. Inda Castle from [place] [facility] regarding a trend in his elevated WBC.   He is accompanied today by his wife of 41 years. The pt reports that he is doing well overall. He recently had a biopsy to check his prostate at [facility] with Dr. Aleen Campi and they took 12 core samples with reportedly no significant findings.  Of note prior to the patient's visit today, pt has had CBC completed on 04/26/17 with results revealing WBC at 14.7, Hgb at 12.6, Lymph Abs at 11.0 with all other values WNL. On 03/26/17 his WBC were 12.8, Hgb at 13.3 and Lymph's at  9.90. On 02/06/17 his WBC were 12.7, Hgb at 13.4, and Lymph's at 8.80.   On review of systems, pt reports post nasal drip, persistent cough, occasional troublesome stomach, pain in his lower abdomen that feels like his muscles are irritated, reports he has lost 25 lbs in the last 2.5 years (he associates a decreased appetite after taking beta blockers during this period) and denies no recent colds or infections, acute changes in energy levels, and leg swelling.   On PMHx the pt reports taking Zetia. He has IB'S and has had 3 colonoscopies with no significant findings or inflammatory processes. He reports that over the last 2.5 years he had heart catheterizations that all turned out well. He reports having diabetes. He also reports neuropathy along his whole right side that lasted for a few months that ceased abruptly.   Interval History:   Eric Harrington returns for management and evaluation of his CLL. We are joined today by Eric Harrington. The patient's last visit with [place] was on 10/29/2019. The pt reports that he is doing well overall.  The pt reports that  he has been doing well. He has been losing some weight within the last year due to not eating as much. He notes some concern in the past over his blood sugar levels that affected him and made him shift his diet to eating less.He notes that is dealing with diverticulosis. He also experiences some lightheadedness and weakness when he really exerts himself. He is no longer on the Losartan and drinks a lot of water daily.   Lab results today 11/25/2020 of CBC w/diff and CMP is as follows: all values are WNL except for WBC of 33.5K, Plt of 117K, Lymphs Abs of 29.3K, Monocytes Abs of 1.7K, Glucose of 136, Creatinine of 1.40, AST of 14, GFR est of 51. 11/25/2020 LDH of 129.  On review of systems, pt reports weight loss, lightheadedness upon exertion and denies fevers, chills, new lumps/bumps, abdominal distention, acute SOB, chest pain, difficulty passing urine, leg swelling, and any other symptoms.  MEDICAL HISTORY:  1. HTN 2. DM2 3. H/o PAC's 4. transient a fib with cardiac cath 5. HLD 6. CKD stage 3 7. Irritable bowel syndrome 8. GERD 9. H/o presyndope  SURGICAL HISTORY:  1. Prostate bx 2. Cardiac cath  SOCIAL HISTORY: Social History   Socioeconomic History   Marital status: Married    Spouse name: Not on file   Number of children: 2   Years of education: Not on file   Highest education level: Not on file  Occupational History   Not on file  Tobacco Use   Smoking status: Former    Packs/day: 1.00    Pack years: 0.00    Types: Cigarettes    Quit date: 1980    Years since quitting: 42.5   Smokeless tobacco: Never   Tobacco comments:    started at age 66  Vaping Use   Vaping Use: Never used  Substance and Sexual Activity   Alcohol use: Never   Drug use: Not Currently   Sexual activity: Not on file  Other Topics Concern   Not on file  Social History Narrative   Not on file   Social Determinants of Health   Financial Resource Strain: Not on file  Food Insecurity: Not  on file  Transportation Needs: Not on file  Physical Activity: Not on file  Stress: Not on file  Social Connections: Not on file  Intimate Partner Violence: Not on file    FAMILY HISTORY: Family History  Problem Relation Age of Onset   Lung cancer Mother    Heart attack Father    Dementia Sister    Diabetes Brother    Diabetes Sister    Heart attack Brother     ALLERGIES:  is allergic to [employer] hcl], cephalexin, gemfibrozil, glipizide, januvia [sitagliptin], lipitor [atorvastatin calcium], lisinopril, losartan, metoprolol, omeprazole, simvastatin, and gabapentin.  MEDICATIONS:  Current Outpatient Medications  Medication Sig Dispense Refill   Ascorbic Acid (VITAMIN C) 1000 MG tablet Take 1,000 mg by mouth 2 (two) times a day.     aspirin EC 81 MG tablet Take 81 mg by mouth daily.     B Complex Vitamins (VITAMIN-B COMPLEX PO) Take by mouth.     carboxymethylcellulose (REFRESH PLUS) 0.5 % SOLN 1 drop 3 (three) times daily as needed.     docusate sodium (COLACE) 100 MG capsule Take 100 mg by mouth daily.      esomeprazole (NEXIUM) 20 MG capsule Take 20 mg by mouth daily at 12 noon.     Glucose Blood (IGLUCOSE TEST STRIPS VI) by In Vitro route.     metFORMIN (GLUCOPHAGE) 1000 MG tablet Take 500 mg by mouth 2 (two) times daily.      Riboflavin (VITAMIN B-2 PO) Take by mouth daily.     Rosuvastatin Calcium 5 MG CPSP Take by mouth daily. 1/2 tab daily     VITAMIN D PO Take by mouth daily.     AMBULATORY NON FORMULARY MEDICATION Take by mouth 2 (two) times daily. Medication Name: Immuneti Advanced Immune Defense     FARXIGA 10 MG TABS tablet Take 10 mg by mouth daily.     glimepiride (AMARYL) 1 MG tablet Take 1 mg by mouth daily.     Homeopathic Products (ALLERGY MEDICINE PO) Take by mouth.     Lancets MISC by Does not apply route.     losartan (COZAAR) 25 MG tablet Take 25 mg by mouth daily after supper. (Patient not taking: Reported on 11/25/2020)     No current  facility-administered medications for this visit.    REVIEW OF SYSTEMS:   10 Point review of Systems was done is negative except as noted above.  PHYSICAL EXAMINATION:  Vitals:   11/25/20 1225  BP: 124/67  Pulse: 80  Resp: 18  Temp: 98.5 F (36.9 C)  SpO2: 100%    Filed Weights   11/25/20 1225  Weight: 174 lb 8 oz (79.2 kg)    NAD. GENERAL:alert, in no acute distress and  comfortable SKIN: no acute rashes, no significant lesions EYES: conjunctiva are pink and non-injected, sclera anicteric OROPHARYNX: MMM, no exudates, no oropharyngeal erythema or ulceration NECK: supple, no JVD LYMPH:  no palpable lymphadenopathy in the cervical, axillary or inguinal regions LUNGS: clear to auscultation b/l with normal respiratory effort HEART: regular rate & rhythm ABDOMEN:  normoactive bowel sounds , non tender, not distended. Extremity: no pedal edema PSYCH: alert & oriented x 3 with fluent speech NEURO: no focal motor/sensory deficits  LABORATORY DATA:  I have reviewed the data as listed  . CBC Latest Ref Rng & Units 11/25/2020 10/29/2019 04/28/2019  WBC 4.0 - 10.5 K/uL 33.5(H) 24.6(H) 24.6(H)  Hemoglobin 13.0 - 17.0 g/dL 13.7 13.0 12.6(L)  Hematocrit 39.0 - 52.0 % 41.2 39.9 37.9(L)  Platelets 150 - 400 K/uL 117(L) 150 147(L)    . CBC    Component Value Date/Time   WBC 33.5 (H) 11/25/2020 1144   WBC 24.6 (H) 10/29/2019 1331   RBC 4.43 11/25/2020 1144   HGB 13.7 11/25/2020 1144   HCT 41.2 11/25/2020 1144   PLT 117 (L) 11/25/2020 1144   MCV 93.0 11/25/2020 1144   MCH 30.9 11/25/2020 1144   MCHC 33.3 11/25/2020 1144   RDW 13.6 11/25/2020 1144   LYMPHSABS 29.3 (H) 11/25/2020 1144   MONOABS 1.7 (H) 11/25/2020 1144   EOSABS 0.0 11/25/2020 1144   BASOSABS 0.1 11/25/2020 1144   .Marland Kitchen Lab Results  Component Value Date   RETICCTPCT 1.4 04/24/2018   RBC 4.43 11/25/2020    CMP Latest Ref Rng & Units 11/25/2020 10/29/2019 04/28/2019  Glucose 70 - 99 mg/dL 136(H) 178(H) 179(H)   BUN 8 - 23 mg/dL 15 16 16   Creatinine 0.61 - 1.24 mg/dL 1.40(H) 1.37(H) 1.45(H)  Sodium 135 - 145 mmol/L 139 135 137  Potassium 3.5 - 5.1 mmol/L 4.3 4.2 4.1  Chloride 98 - 111 mmol/L 104 103 101  CO2 22 - 32 mmol/L 26 24 24   Calcium 8.9 - 10.3 mg/dL 9.5 9.7 9.0  Total Protein 6.5 - 8.1 g/dL 6.6 6.8 6.7  Total Bilirubin 0.3 - 1.2 mg/dL 0.4 0.3 0.4  Alkaline Phos 38 - 126 U/L 106 109 92  AST 15 - 41 U/L 14(L) 12(L) 14(L)  ALT 0 - 44 U/L 18 16 20    . Lab Results  Component Value Date   LDH 129 11/25/2020   Component     Latest Ref Rng & Units 06/13/2017  HCV Ab     0.0 - 0.9 s/co ratio <0.1  Hepatitis B Surface Ag     Negative Negative  Hep B Core Ab, Tot     Negative Negative         RADIOGRAPHIC STUDIES: I have personally reviewed the radiological images as listed and agreed with the findings in the report. No results found.  ASSESSMENT & PLAN:   80 y.o. is a male with  1. Recently diagnosed Chronic lymphocytic leukemia. Likely Rai Stage 0  -he presented with Lymphocytosis incidentally noted on routine labs No associated significant anemia or thrombocytopenia. No constitutional symptoms No overt clinically palpable LNadenopathy or hepato-splenomegaly. 13q mutation present   08/28/17 CT C/A/P revealed Normal sized spleen, no enlarged lymph nodes. There is evidence of very small lung nodule, likely related to inflammatory process. Will monitor with CT chest in 12 months     PLAN:  -Discussed pt labwork today, 11/25/2020; WBC elevated slightly but not dramatic, other counts normal, Hgb improved, LDH normal. Chemistries are stable given  CKD. Plt decreased slightly. -Advised pt that the type of mutation (13 q deletion) he has is generally well behaved. -Advised pt there does not appear to be any signs of symptomatic progression at this time. The increase in WBC is not dramatic nor very concerning at this time. -Recommended pt drink 48-64 oz daily and walk 20-30  minutes daily. Emphasized staying active. -The pt shows no overt clinical or lab progression of his CLL at this time.  -No indication to initiate treatment at this time. -Continue Vitamin B complex -Advised pt that his labs need to be looked at in six months due to plt decreasing and WBC increasing. Pt prefers to get this checked here due to no current appt with his PCP at that time. -Will see back in 6 months with labs.   FOLLOW UP: RTC with Dr Irene Limbo with labs in 6 months   The total time spent in the appt was 20 minutes and more than 50% was on counseling and direct patient cares.  All of the patient's questions were answered with apparent satisfaction. The patient knows to call the clinic with any problems, questions or concerns.  Sullivan Lone MD [place] AAHIVMS [facility] Hematology/Oncology Physician [facility]  (Office):       [phone] (Work cell):  [phone] (Fax):           [phone]  I, Reinaldo Raddle, am acting as scribe for Dr. Sullivan Lone, MD.     .I have reviewed the above documentation for accuracy and completeness, and I agree with the above. Brunetta Genera MD

## 2020-11-25 ENCOUNTER — Other Ambulatory Visit: Payer: Self-pay

## 2020-11-25 ENCOUNTER — Inpatient Hospital Stay (HOSPITAL_BASED_OUTPATIENT_CLINIC_OR_DEPARTMENT_OTHER): Payer: Medicare Other | Admitting: Hematology

## 2020-11-25 ENCOUNTER — Inpatient Hospital Stay: Payer: Medicare Other | Attending: Hematology

## 2020-11-25 VITALS — BP 124/67 | HR 80 | Temp 98.5°F | Resp 18 | Wt 174.5 lb

## 2020-11-25 DIAGNOSIS — I4891 Unspecified atrial fibrillation: Secondary | ICD-10-CM | POA: Diagnosis not present

## 2020-11-25 DIAGNOSIS — Z79899 Other long term (current) drug therapy: Secondary | ICD-10-CM | POA: Insufficient documentation

## 2020-11-25 DIAGNOSIS — K219 Gastro-esophageal reflux disease without esophagitis: Secondary | ICD-10-CM | POA: Insufficient documentation

## 2020-11-25 DIAGNOSIS — E119 Type 2 diabetes mellitus without complications: Secondary | ICD-10-CM | POA: Insufficient documentation

## 2020-11-25 DIAGNOSIS — D72829 Elevated white blood cell count, unspecified: Secondary | ICD-10-CM | POA: Insufficient documentation

## 2020-11-25 DIAGNOSIS — I10 Essential (primary) hypertension: Secondary | ICD-10-CM | POA: Diagnosis not present

## 2020-11-25 DIAGNOSIS — C9110 Chronic lymphocytic leukemia of B-cell type not having achieved remission: Secondary | ICD-10-CM

## 2020-11-25 DIAGNOSIS — N183 Chronic kidney disease, stage 3 unspecified: Secondary | ICD-10-CM | POA: Insufficient documentation

## 2020-11-25 DIAGNOSIS — E785 Hyperlipidemia, unspecified: Secondary | ICD-10-CM | POA: Insufficient documentation

## 2020-11-25 LAB — CMP (CANCER CENTER ONLY)
ALT: 18 U/L (ref 0–44)
AST: 14 U/L — ABNORMAL LOW (ref 15–41)
Albumin: 4 g/dL (ref 3.5–5.0)
Alkaline Phosphatase: 106 U/L (ref 38–126)
Anion gap: 9 (ref 5–15)
BUN: 15 mg/dL (ref 8–23)
CO2: 26 mmol/L (ref 22–32)
Calcium: 9.5 mg/dL (ref 8.9–10.3)
Chloride: 104 mmol/L (ref 98–111)
Creatinine: 1.4 mg/dL — ABNORMAL HIGH (ref 0.61–1.24)
GFR, Estimated: 51 mL/min — ABNORMAL LOW (ref >60)
Glucose, Bld: 136 mg/dL — ABNORMAL HIGH (ref 70–99)
Potassium: 4.3 mmol/L (ref 3.5–5.1)
Sodium: 139 mmol/L (ref 135–145)
Total Bilirubin: 0.4 mg/dL (ref 0.3–1.2)
Total Protein: 6.6 g/dL (ref 6.5–8.1)

## 2020-11-25 LAB — CBC WITH DIFFERENTIAL (CANCER CENTER ONLY)
Abs Immature Granulocytes: 0.03 10*3/uL (ref 0.00–0.07)
Basophils Absolute: 0.1 10*3/uL (ref 0.0–0.1)
Basophils Relative: 0 %
Eosinophils Absolute: 0 10*3/uL (ref 0.0–0.5)
Eosinophils Relative: 0 %
HCT: 41.2 % (ref 39.0–52.0)
Hemoglobin: 13.7 g/dL (ref 13.0–17.0)
Immature Granulocytes: 0 %
Lymphocytes Relative: 88 %
Lymphs Abs: 29.3 10*3/uL — ABNORMAL HIGH (ref 0.7–4.0)
MCH: 30.9 pg (ref 26.0–34.0)
MCHC: 33.3 g/dL (ref 30.0–36.0)
MCV: 93 fL (ref 80.0–100.0)
Monocytes Absolute: 1.7 10*3/uL — ABNORMAL HIGH (ref 0.1–1.0)
Monocytes Relative: 5 %
Neutro Abs: 2.5 10*3/uL (ref 1.7–7.7)
Neutrophils Relative %: 7 %
Platelet Count: 117 10*3/uL — ABNORMAL LOW (ref 150–400)
RBC: 4.43 MIL/uL (ref 4.22–5.81)
RDW: 13.6 % (ref 11.5–15.5)
WBC Count: 33.5 10*3/uL — ABNORMAL HIGH (ref 4.0–10.5)
nRBC: 0.1 % (ref 0.0–0.2)

## 2020-11-25 LAB — LACTATE DEHYDROGENASE: LDH: 129 U/L (ref 98–192)

## 2021-03-06 DIAGNOSIS — J4520 Mild intermittent asthma, uncomplicated: Secondary | ICD-10-CM | POA: Diagnosis not present

## 2021-03-06 DIAGNOSIS — R051 Acute cough: Secondary | ICD-10-CM | POA: Diagnosis not present

## 2021-03-23 DIAGNOSIS — H6503 Acute serous otitis media, bilateral: Secondary | ICD-10-CM | POA: Diagnosis not present

## 2021-03-29 DIAGNOSIS — Z23 Encounter for immunization: Secondary | ICD-10-CM | POA: Diagnosis not present

## 2021-05-26 ENCOUNTER — Inpatient Hospital Stay: Payer: Medicare Other | Attending: Hematology | Admitting: Hematology

## 2021-05-26 ENCOUNTER — Inpatient Hospital Stay: Payer: Medicare Other

## 2021-05-26 ENCOUNTER — Other Ambulatory Visit: Payer: Self-pay

## 2021-05-26 VITALS — BP 113/69 | HR 84 | Temp 97.3°F | Resp 18 | Wt 177.5 lb

## 2021-05-26 DIAGNOSIS — N189 Chronic kidney disease, unspecified: Secondary | ICD-10-CM | POA: Insufficient documentation

## 2021-05-26 DIAGNOSIS — E785 Hyperlipidemia, unspecified: Secondary | ICD-10-CM | POA: Insufficient documentation

## 2021-05-26 DIAGNOSIS — C9110 Chronic lymphocytic leukemia of B-cell type not having achieved remission: Secondary | ICD-10-CM | POA: Diagnosis not present

## 2021-05-26 DIAGNOSIS — I129 Hypertensive chronic kidney disease with stage 1 through stage 4 chronic kidney disease, or unspecified chronic kidney disease: Secondary | ICD-10-CM | POA: Diagnosis not present

## 2021-05-26 DIAGNOSIS — Z801 Family history of malignant neoplasm of trachea, bronchus and lung: Secondary | ICD-10-CM | POA: Diagnosis not present

## 2021-05-26 DIAGNOSIS — D696 Thrombocytopenia, unspecified: Secondary | ICD-10-CM | POA: Diagnosis not present

## 2021-05-26 DIAGNOSIS — G629 Polyneuropathy, unspecified: Secondary | ICD-10-CM | POA: Diagnosis not present

## 2021-05-26 DIAGNOSIS — N183 Chronic kidney disease, stage 3 unspecified: Secondary | ICD-10-CM | POA: Insufficient documentation

## 2021-05-26 DIAGNOSIS — E119 Type 2 diabetes mellitus without complications: Secondary | ICD-10-CM | POA: Insufficient documentation

## 2021-05-26 DIAGNOSIS — I4891 Unspecified atrial fibrillation: Secondary | ICD-10-CM | POA: Diagnosis not present

## 2021-05-26 DIAGNOSIS — Z87891 Personal history of nicotine dependence: Secondary | ICD-10-CM | POA: Diagnosis not present

## 2021-05-26 DIAGNOSIS — Z79899 Other long term (current) drug therapy: Secondary | ICD-10-CM | POA: Insufficient documentation

## 2021-05-26 LAB — CMP (CANCER CENTER ONLY)
ALT: 16 U/L (ref 0–44)
AST: 15 U/L (ref 15–41)
Albumin: 4.4 g/dL (ref 3.5–5.0)
Alkaline Phosphatase: 94 U/L (ref 38–126)
Anion gap: 9 (ref 5–15)
BUN: 18 mg/dL (ref 8–23)
CO2: 24 mmol/L (ref 22–32)
Calcium: 9.5 mg/dL (ref 8.9–10.3)
Chloride: 103 mmol/L (ref 98–111)
Creatinine: 1.33 mg/dL — ABNORMAL HIGH (ref 0.61–1.24)
GFR, Estimated: 55 mL/min — ABNORMAL LOW (ref >60)
Glucose, Bld: 152 mg/dL — ABNORMAL HIGH (ref 70–99)
Potassium: 4.4 mmol/L (ref 3.5–5.1)
Sodium: 136 mmol/L (ref 135–145)
Total Bilirubin: 0.5 mg/dL (ref 0.3–1.2)
Total Protein: 6.7 g/dL (ref 6.5–8.1)

## 2021-05-26 LAB — CBC WITH DIFFERENTIAL (CANCER CENTER ONLY)
Abs Immature Granulocytes: 0.02 10*3/uL (ref 0.00–0.07)
Basophils Absolute: 0.1 10*3/uL (ref 0.0–0.1)
Basophils Relative: 0 %
Eosinophils Absolute: 0 10*3/uL (ref 0.0–0.5)
Eosinophils Relative: 0 %
HCT: 39.7 % (ref 39.0–52.0)
Hemoglobin: 13.1 g/dL (ref 13.0–17.0)
Immature Granulocytes: 0 %
Lymphocytes Relative: 90 %
Lymphs Abs: 30.4 10*3/uL — ABNORMAL HIGH (ref 0.7–4.0)
MCH: 31.3 pg (ref 26.0–34.0)
MCHC: 33 g/dL (ref 30.0–36.0)
MCV: 95 fL (ref 80.0–100.0)
Monocytes Absolute: 1.3 10*3/uL — ABNORMAL HIGH (ref 0.1–1.0)
Monocytes Relative: 4 %
Neutro Abs: 1.9 10*3/uL (ref 1.7–7.7)
Neutrophils Relative %: 6 %
Platelet Count: 123 10*3/uL — ABNORMAL LOW (ref 150–400)
RBC: 4.18 MIL/uL — ABNORMAL LOW (ref 4.22–5.81)
RDW: 14 % (ref 11.5–15.5)
Smear Review: NORMAL
WBC Count: 33.6 10*3/uL — ABNORMAL HIGH (ref 4.0–10.5)
nRBC: 0 % (ref 0.0–0.2)

## 2021-05-26 LAB — LACTATE DEHYDROGENASE: LDH: 123 U/L (ref 98–192)

## 2021-06-01 NOTE — Progress Notes (Addendum)
HEMATOLOGY ONCOLOGY CLINIC NOTE  Date of Service:  .05/26/2021   Patient Care Team: Eric Macadam, MD as PCP - General (Family Medicine)  CHIEF COMPLAINTS/PURPOSE OF CONSULTATION:  Follow-up for continued management of CLL  HISTORY OF PRESENTING ILLNESS:   Eric Harrington 81 y.o. male is here because of a referral from Dr. Inda Harrington from [facility] regarding a trend in his elevated WBC.   He is accompanied today by his wife of 32 years. The pt reports that he is doing well overall. He recently had a biopsy to check his prostate at [facility] with Dr. Aleen Harrington and they took 12 core samples with reportedly no significant findings.  Of note prior to the patient's visit today, pt has had CBC completed on 04/26/17 with results revealing WBC at 14.7, Hgb at 12.6, Lymph Abs at 11.0 with all other values WNL. On 03/26/17 his WBC were 12.8, Hgb at 13.3 and Lymph's at  9.90. On 02/06/17 his WBC were 12.7, Hgb at 13.4, and Lymph's at 8.80.   On review of systems, pt reports post nasal drip, persistent cough, occasional troublesome stomach, pain in his lower abdomen that feels like his muscles are irritated, reports he has lost 25 lbs in the last 2.5 years (he associates a decreased appetite after taking beta blockers during this period) and denies no recent colds or infections, acute changes in energy levels, and leg swelling.   On PMHx the pt reports taking Zetia. He has IB'S and has had 3 colonoscopies with no significant findings or inflammatory processes. He reports that over the last 2.5 years he had heart catheterizations that all turned out well. He reports having diabetes. He also reports neuropathy along his whole right side that lasted for a few months that ceased abruptly.   Interval History:   Eric Harrington is a wonderful gentleman who is here for continued evaluation and management of his CLL.  His last clinic visit with [place] was about 6 months ago on 11/25/2020. He notes that  he has been doing well and has no acute new symptoms. His weight has been stable since his last clinic visit. No fevers no chills no night sweats.  No new fatigue. No abnormal bleeding or bruising.  No issues with frequent infections..  Labs done 05/26/2021 CBC shows normal hemoglobin of 13.1, stable WBC count of 33.6k and mild stable thrombocytopenia of 123k CMP stable with chronic kidney disease creatinine of 1.33 LDH within normal limits at 123.  Patient notes no other acute new focal symptoms.   MEDICAL HISTORY:  1. HTN 2. DM2 3. H/o PAC's 4. transient a fib with cardiac cath 5. HLD 6. CKD stage 3 7. Irritable bowel syndrome 8. GERD 9. H/o presyndope  SURGICAL HISTORY:  1. Prostate bx 2. Cardiac cath  SOCIAL HISTORY: Social History   Socioeconomic History   Marital status: Married    Spouse name: Not on file   Number of children: 2   Years of education: Not on file   Highest education level: Not on file  Occupational History   Not on file  Tobacco Use   Smoking status: Former    Packs/day: 1.00    Types: Cigarettes    Quit date: 1980    Years since quitting: 43.0   Smokeless tobacco: Never   Tobacco comments:    started at age 43  Vaping Use   Vaping Use: Never used  Substance and Sexual Activity   Alcohol use: Never  Drug use: Not Currently   Sexual activity: Not on file  Other Topics Concern   Not on file  Social History Narrative   Not on file   Social Determinants of Health   Financial Resource Strain: Not on file  Food Insecurity: Not on file  Transportation Needs: Not on file  Physical Activity: Not on file  Stress: Not on file  Social Connections: Not on file  Intimate Partner Violence: Not on file    FAMILY HISTORY: Family History  Problem Relation Age of Onset   Lung cancer Mother    Heart attack Father    Dementia Sister    Diabetes Brother    Diabetes Sister    Heart attack Brother     ALLERGIES:  is allergic to [employer] hcl], cephalexin, gemfibrozil, glipizide, januvia [sitagliptin], lipitor [atorvastatin calcium], lisinopril, losartan, metoprolol, omeprazole, simvastatin, and gabapentin.  MEDICATIONS:  Current Outpatient Medications  Medication Sig Dispense Refill   AMBULATORY NON FORMULARY MEDICATION Take by mouth 2 (two) times daily. Medication Name: Immuneti Advanced Immune Defense     Ascorbic Acid (VITAMIN C) 1000 MG tablet Take 1,000 mg by mouth 2 (two) times a day.     aspirin EC 81 MG tablet Take 81 mg by mouth daily.     B Complex Vitamins (VITAMIN-B COMPLEX PO) Take by mouth.     carboxymethylcellulose (REFRESH PLUS) 0.5 % SOLN 1 drop 3 (three) times daily as needed.     docusate sodium (COLACE) 100 MG capsule Take 100 mg by mouth daily.      esomeprazole (NEXIUM) 20 MG capsule Take 20 mg by mouth daily at 12 noon.     FARXIGA 10 MG TABS tablet Take 10 mg by mouth daily.     glimepiride (AMARYL) 1 MG tablet Take 1 mg by mouth daily.     Glucose Blood (IGLUCOSE TEST STRIPS VI) by In Vitro route.     Homeopathic Products (ALLERGY MEDICINE PO) Take by mouth.     Lancets MISC by Does not apply route.     losartan (COZAAR) 25 MG tablet Take 25 mg by mouth daily after supper. (Patient not taking: Reported on 11/25/2020)     metFORMIN (GLUCOPHAGE) 1000 MG tablet Take 500 mg by mouth 2 (two) times daily.      Riboflavin (VITAMIN B-2 PO) Take by mouth daily.     Rosuvastatin Calcium 5 MG CPSP Take by mouth daily. 1 tab daily     VITAMIN D PO Take by mouth daily.     No current facility-administered medications for this visit.    REVIEW OF SYSTEMS:   .10 Point review of Systems was done is negative except as noted above.   PHYSICAL EXAMINATION:  Vitals:   05/26/21 1220  BP: 113/69  Pulse: 84  Resp: 18  Temp: (!) 97.3 F (36.3 C)  SpO2: 100%    Filed Weights   05/26/21 1220  Weight: 177 lb 8 oz (80.5 kg)   . GENERAL:alert, in no acute distress and comfortable SKIN: no acute  rashes, no significant lesions EYES: conjunctiva are pink and non-injected, sclera anicteric OROPHARYNX: MMM, no exudates, no oropharyngeal erythema or ulceration NECK: supple, no JVD LYMPH:  no palpable lymphadenopathy in the cervical, axillary or inguinal regions LUNGS: clear to auscultation b/l with normal respiratory effort HEART: regular rate & rhythm ABDOMEN:  normoactive bowel sounds , non tender, not distended. Extremity: no pedal edema PSYCH: alert & oriented x 3 with fluent speech NEURO: no  focal motor/sensory deficits   LABORATORY DATA:  I have reviewed the data as listed  . CBC Latest Ref Rng & Units 05/26/2021 11/25/2020 10/29/2019  WBC 4.0 - 10.5 K/uL 33.6(H) 33.5(H) 24.6(H)  Hemoglobin 13.0 - 17.0 g/dL 13.1 13.7 13.0  Hematocrit 39.0 - 52.0 % 39.7 41.2 39.9  Platelets 150 - 400 K/uL 123(L) 117(L) 150    . CBC    Component Value Date/Time   WBC 33.6 (H) 05/26/2021 1150   WBC 24.6 (H) 10/29/2019 1331   RBC 4.18 (L) 05/26/2021 1150   HGB 13.1 05/26/2021 1150   HCT 39.7 05/26/2021 1150   PLT 123 (L) 05/26/2021 1150   MCV 95.0 05/26/2021 1150   MCH 31.3 05/26/2021 1150   MCHC 33.0 05/26/2021 1150   RDW 14.0 05/26/2021 1150   LYMPHSABS 30.4 (H) 05/26/2021 1150   MONOABS 1.3 (H) 05/26/2021 1150   EOSABS 0.0 05/26/2021 1150   BASOSABS 0.1 05/26/2021 1150   .Marland Kitchen Lab Results  Component Value Date   RETICCTPCT 1.4 04/24/2018   RBC 4.18 (L) 05/26/2021    CMP Latest Ref Rng & Units 05/26/2021 11/25/2020 10/29/2019  Glucose 70 - 99 mg/dL 152(H) 136(H) 178(H)  BUN 8 - 23 mg/dL 18 15 16   Creatinine 0.61 - 1.24 mg/dL 1.33(H) 1.40(H) 1.37(H)  Sodium 135 - 145 mmol/L 136 139 135  Potassium 3.5 - 5.1 mmol/L 4.4 4.3 4.2  Chloride 98 - 111 mmol/L 103 104 103  CO2 22 - 32 mmol/L 24 26 24   Calcium 8.9 - 10.3 mg/dL 9.5 9.5 9.7  Total Protein 6.5 - 8.1 g/dL 6.7 6.6 6.8  Total Bilirubin 0.3 - 1.2 mg/dL 0.5 0.4 0.3  Alkaline Phos 38 - 126 U/L 94 106 109  AST 15 - 41 U/L 15 14(L)  12(L)  ALT 0 - 44 U/L 16 18 16    . Lab Results  Component Value Date   LDH 123 05/26/2021   Component     Latest Ref Rng & Units 06/13/2017  HCV Ab     0.0 - 0.9 s/co ratio <0.1  Hepatitis B Surface Ag     Negative Negative  Hep B Core Ab, Tot     Negative Negative         RADIOGRAPHIC STUDIES: I have personally reviewed the radiological images as listed and agreed with the findings in the report. No results found.  ASSESSMENT & PLAN:   81 y.o. is a male with  1. Chronic lymphocytic leukemia. Likely Rai Stage 0, CLL FISH panel with 13 q. deletion  -he presented with Lymphocytosis incidentally noted on routine labs No associated significant anemia or thrombocytopenia. No constitutional symptoms No overt clinically palpable LNadenopathy or hepato-splenomegaly. 13q mutation present   08/28/17 CT C/A/P revealed Normal sized spleen, no enlarged lymph nodes. There is evidence of very small lung nodule, likely related to inflammatory process. Will monitor with CT chest in 12 months   PLAN:  -Patient's CBC CMP and LDH done today are stable. -No overt lab or clinical evidence of CLL progression at this time. -Patient has no lab or clinical indications for initiation of CLL treatment at this time. -Continue healthy lifestyle choices as discussed. -Continue follow-up with primary care physician to optimize his chronic medical issues. -If his next labs in 6 months remained stable we discussed that we could potentially alternate visits for the CLL with his primary care physician.  FOLLOW UP: RTC with Dr Irene Limbo with labs in 6 months  All of the patient's  questions were answered with apparent satisfaction. The patient knows to call the clinic with any problems, questions or concerns.  Sullivan Lone MD [place] AAHIVMS [facility] Hematology/Oncology Physician [facility]

## 2021-06-06 DIAGNOSIS — N1831 Chronic kidney disease, stage 3a: Secondary | ICD-10-CM | POA: Diagnosis not present

## 2021-06-14 DIAGNOSIS — E559 Vitamin D deficiency, unspecified: Secondary | ICD-10-CM | POA: Diagnosis not present

## 2021-06-14 DIAGNOSIS — C9110 Chronic lymphocytic leukemia of B-cell type not having achieved remission: Secondary | ICD-10-CM | POA: Diagnosis not present

## 2021-06-14 DIAGNOSIS — D892 Hypergammaglobulinemia, unspecified: Secondary | ICD-10-CM | POA: Diagnosis not present

## 2021-06-14 DIAGNOSIS — I129 Hypertensive chronic kidney disease with stage 1 through stage 4 chronic kidney disease, or unspecified chronic kidney disease: Secondary | ICD-10-CM | POA: Diagnosis not present

## 2021-06-14 DIAGNOSIS — N1831 Chronic kidney disease, stage 3a: Secondary | ICD-10-CM | POA: Diagnosis not present

## 2021-06-27 DIAGNOSIS — I781 Nevus, non-neoplastic: Secondary | ICD-10-CM | POA: Diagnosis not present

## 2021-06-27 DIAGNOSIS — D3610 Benign neoplasm of peripheral nerves and autonomic nervous system, unspecified: Secondary | ICD-10-CM | POA: Diagnosis not present

## 2021-06-27 DIAGNOSIS — D692 Other nonthrombocytopenic purpura: Secondary | ICD-10-CM | POA: Diagnosis not present

## 2021-06-27 DIAGNOSIS — L570 Actinic keratosis: Secondary | ICD-10-CM | POA: Diagnosis not present

## 2021-06-27 DIAGNOSIS — L578 Other skin changes due to chronic exposure to nonionizing radiation: Secondary | ICD-10-CM | POA: Diagnosis not present

## 2021-06-27 DIAGNOSIS — L820 Inflamed seborrheic keratosis: Secondary | ICD-10-CM | POA: Diagnosis not present

## 2021-06-27 DIAGNOSIS — L821 Other seborrheic keratosis: Secondary | ICD-10-CM | POA: Diagnosis not present

## 2021-07-28 DIAGNOSIS — R972 Elevated prostate specific antigen [PSA]: Secondary | ICD-10-CM | POA: Diagnosis not present

## 2021-07-28 DIAGNOSIS — Z23 Encounter for immunization: Secondary | ICD-10-CM | POA: Diagnosis not present

## 2021-07-28 DIAGNOSIS — E7800 Pure hypercholesterolemia, unspecified: Secondary | ICD-10-CM | POA: Diagnosis not present

## 2021-07-28 DIAGNOSIS — C9190 Lymphoid leukemia, unspecified not having achieved remission: Secondary | ICD-10-CM | POA: Diagnosis not present

## 2021-07-28 DIAGNOSIS — I10 Essential (primary) hypertension: Secondary | ICD-10-CM | POA: Diagnosis not present

## 2021-07-28 DIAGNOSIS — E1122 Type 2 diabetes mellitus with diabetic chronic kidney disease: Secondary | ICD-10-CM | POA: Diagnosis not present

## 2021-07-28 DIAGNOSIS — Z0000 Encounter for general adult medical examination without abnormal findings: Secondary | ICD-10-CM | POA: Diagnosis not present

## 2021-07-28 DIAGNOSIS — I2510 Atherosclerotic heart disease of native coronary artery without angina pectoris: Secondary | ICD-10-CM | POA: Diagnosis not present

## 2021-08-12 DIAGNOSIS — J0100 Acute maxillary sinusitis, unspecified: Secondary | ICD-10-CM | POA: Diagnosis not present

## 2021-08-12 DIAGNOSIS — B9689 Other specified bacterial agents as the cause of diseases classified elsewhere: Secondary | ICD-10-CM | POA: Diagnosis not present

## 2021-08-22 DIAGNOSIS — R591 Generalized enlarged lymph nodes: Secondary | ICD-10-CM | POA: Diagnosis not present

## 2021-08-22 DIAGNOSIS — R051 Acute cough: Secondary | ICD-10-CM | POA: Diagnosis not present

## 2021-08-22 DIAGNOSIS — R5383 Other fatigue: Secondary | ICD-10-CM | POA: Diagnosis not present

## 2021-10-13 DIAGNOSIS — R972 Elevated prostate specific antigen [PSA]: Secondary | ICD-10-CM | POA: Diagnosis not present

## 2021-10-31 ENCOUNTER — Encounter: Payer: Self-pay | Admitting: Specialist

## 2021-10-31 DIAGNOSIS — I10 Essential (primary) hypertension: Secondary | ICD-10-CM | POA: Diagnosis not present

## 2021-10-31 DIAGNOSIS — E7800 Pure hypercholesterolemia, unspecified: Secondary | ICD-10-CM | POA: Diagnosis not present

## 2021-10-31 DIAGNOSIS — M25551 Pain in right hip: Secondary | ICD-10-CM | POA: Diagnosis not present

## 2021-10-31 DIAGNOSIS — C9190 Lymphoid leukemia, unspecified not having achieved remission: Secondary | ICD-10-CM | POA: Diagnosis not present

## 2021-10-31 DIAGNOSIS — E1122 Type 2 diabetes mellitus with diabetic chronic kidney disease: Secondary | ICD-10-CM | POA: Diagnosis not present

## 2021-10-31 DIAGNOSIS — I2510 Atherosclerotic heart disease of native coronary artery without angina pectoris: Secondary | ICD-10-CM | POA: Diagnosis not present

## 2021-11-03 DIAGNOSIS — H43813 Vitreous degeneration, bilateral: Secondary | ICD-10-CM | POA: Diagnosis not present

## 2021-11-03 DIAGNOSIS — E119 Type 2 diabetes mellitus without complications: Secondary | ICD-10-CM | POA: Diagnosis not present

## 2021-11-03 DIAGNOSIS — H04123 Dry eye syndrome of bilateral lacrimal glands: Secondary | ICD-10-CM | POA: Diagnosis not present

## 2021-11-03 DIAGNOSIS — Z961 Presence of intraocular lens: Secondary | ICD-10-CM | POA: Diagnosis not present

## 2021-11-22 ENCOUNTER — Telehealth: Payer: Self-pay | Admitting: Hematology

## 2021-11-22 NOTE — Telephone Encounter (Signed)
Rescheduled upcoming appointment due to provider's PAL. Patient is aware of changes. ?

## 2021-11-29 ENCOUNTER — Inpatient Hospital Stay: Payer: Medicare Other

## 2021-11-29 ENCOUNTER — Inpatient Hospital Stay: Payer: Medicare Other | Admitting: Hematology

## 2021-12-06 DIAGNOSIS — M545 Low back pain, unspecified: Secondary | ICD-10-CM | POA: Diagnosis not present

## 2021-12-06 DIAGNOSIS — M25551 Pain in right hip: Secondary | ICD-10-CM | POA: Diagnosis not present

## 2021-12-06 DIAGNOSIS — M25561 Pain in right knee: Secondary | ICD-10-CM | POA: Diagnosis not present

## 2021-12-14 DIAGNOSIS — M545 Low back pain, unspecified: Secondary | ICD-10-CM | POA: Diagnosis not present

## 2022-01-03 DIAGNOSIS — M5451 Vertebrogenic low back pain: Secondary | ICD-10-CM | POA: Diagnosis not present

## 2022-01-09 ENCOUNTER — Other Ambulatory Visit: Payer: Self-pay | Admitting: *Deleted

## 2022-01-09 DIAGNOSIS — C9110 Chronic lymphocytic leukemia of B-cell type not having achieved remission: Secondary | ICD-10-CM

## 2022-01-10 ENCOUNTER — Inpatient Hospital Stay (HOSPITAL_BASED_OUTPATIENT_CLINIC_OR_DEPARTMENT_OTHER): Payer: Medicare Other | Admitting: Hematology

## 2022-01-10 ENCOUNTER — Other Ambulatory Visit: Payer: Self-pay

## 2022-01-10 ENCOUNTER — Inpatient Hospital Stay: Payer: Medicare Other | Attending: Hematology

## 2022-01-10 VITALS — BP 105/90 | HR 70 | Temp 97.9°F | Resp 15 | Ht 70.0 in | Wt 174.2 lb

## 2022-01-10 DIAGNOSIS — N183 Chronic kidney disease, stage 3 unspecified: Secondary | ICD-10-CM | POA: Diagnosis not present

## 2022-01-10 DIAGNOSIS — Z801 Family history of malignant neoplasm of trachea, bronchus and lung: Secondary | ICD-10-CM | POA: Diagnosis not present

## 2022-01-10 DIAGNOSIS — C9110 Chronic lymphocytic leukemia of B-cell type not having achieved remission: Secondary | ICD-10-CM | POA: Diagnosis not present

## 2022-01-10 DIAGNOSIS — K219 Gastro-esophageal reflux disease without esophagitis: Secondary | ICD-10-CM | POA: Diagnosis not present

## 2022-01-10 DIAGNOSIS — I129 Hypertensive chronic kidney disease with stage 1 through stage 4 chronic kidney disease, or unspecified chronic kidney disease: Secondary | ICD-10-CM | POA: Insufficient documentation

## 2022-01-10 DIAGNOSIS — E785 Hyperlipidemia, unspecified: Secondary | ICD-10-CM | POA: Diagnosis not present

## 2022-01-10 DIAGNOSIS — K589 Irritable bowel syndrome without diarrhea: Secondary | ICD-10-CM | POA: Insufficient documentation

## 2022-01-10 DIAGNOSIS — E119 Type 2 diabetes mellitus without complications: Secondary | ICD-10-CM | POA: Insufficient documentation

## 2022-01-10 DIAGNOSIS — Z87891 Personal history of nicotine dependence: Secondary | ICD-10-CM | POA: Insufficient documentation

## 2022-01-10 DIAGNOSIS — I4891 Unspecified atrial fibrillation: Secondary | ICD-10-CM | POA: Diagnosis not present

## 2022-01-10 LAB — CMP (CANCER CENTER ONLY)
ALT: 11 U/L (ref 0–44)
AST: 13 U/L — ABNORMAL LOW (ref 15–41)
Albumin: 4.4 g/dL (ref 3.5–5.0)
Alkaline Phosphatase: 92 U/L (ref 38–126)
Anion gap: 5 (ref 5–15)
BUN: 17 mg/dL (ref 8–23)
CO2: 27 mmol/L (ref 22–32)
Calcium: 9.5 mg/dL (ref 8.9–10.3)
Chloride: 103 mmol/L (ref 98–111)
Creatinine: 1.24 mg/dL (ref 0.61–1.24)
GFR, Estimated: 59 mL/min — ABNORMAL LOW (ref >60)
Glucose, Bld: 142 mg/dL — ABNORMAL HIGH (ref 70–99)
Potassium: 4.2 mmol/L (ref 3.5–5.1)
Sodium: 135 mmol/L (ref 135–145)
Total Bilirubin: 0.5 mg/dL (ref 0.3–1.2)
Total Protein: 6.3 g/dL — ABNORMAL LOW (ref 6.5–8.1)

## 2022-01-10 LAB — CBC WITH DIFFERENTIAL (CANCER CENTER ONLY)
Abs Immature Granulocytes: 0.02 10*3/uL (ref 0.00–0.07)
Basophils Absolute: 0 10*3/uL (ref 0.0–0.1)
Basophils Relative: 0 %
Eosinophils Absolute: 0 10*3/uL (ref 0.0–0.5)
Eosinophils Relative: 0 %
HCT: 37.1 % — ABNORMAL LOW (ref 39.0–52.0)
Hemoglobin: 12.4 g/dL — ABNORMAL LOW (ref 13.0–17.0)
Immature Granulocytes: 0 %
Lymphocytes Relative: 92 %
Lymphs Abs: 44.5 10*3/uL — ABNORMAL HIGH (ref 0.7–4.0)
MCH: 31.2 pg (ref 26.0–34.0)
MCHC: 33.4 g/dL (ref 30.0–36.0)
MCV: 93.5 fL (ref 80.0–100.0)
Monocytes Absolute: 2.5 10*3/uL — ABNORMAL HIGH (ref 0.1–1.0)
Monocytes Relative: 5 %
Neutro Abs: 1.4 10*3/uL — ABNORMAL LOW (ref 1.7–7.7)
Neutrophils Relative %: 3 %
Platelet Count: 105 10*3/uL — ABNORMAL LOW (ref 150–400)
RBC: 3.97 MIL/uL — ABNORMAL LOW (ref 4.22–5.81)
RDW: 14.3 % (ref 11.5–15.5)
Smear Review: NORMAL
WBC Count: 48.4 10*3/uL — ABNORMAL HIGH (ref 4.0–10.5)
nRBC: 0 % (ref 0.0–0.2)

## 2022-01-10 LAB — LACTATE DEHYDROGENASE: LDH: 128 U/L (ref 98–192)

## 2022-01-16 NOTE — Progress Notes (Unsigned)
HEMATOLOGY ONCOLOGY CLINIC NOTE  Date of Service:  .01/10/2022   Patient Care Team: [name], MD as PCP - General (Family Medicine)  CHIEF COMPLAINTS/PURPOSE OF CONSULTATION:  Continued evaluation and management of CLL  HISTORY OF PRESENTING ILLNESS:   Eric Harrington 81 y.o. male is here because of a referral from Dr. Inda Castle from [facility] regarding a trend in his elevated WBC.   He is accompanied today by his wife of 88 years. The pt reports that he is doing well overall. He recently had a biopsy to check his prostate at [facility] with Dr. Aleen Campi and they took 12 core samples with reportedly no significant findings.  Of note prior to the patient's visit today, pt has had CBC completed on 04/26/17 with results revealing WBC at 14.7, Hgb at 12.6, Lymph Abs at 11.0 with all other values WNL. On 03/26/17 his WBC were 12.8, Hgb at 13.3 and Lymph's at  9.90. On 02/06/17 his WBC were 12.7, Hgb at 13.4, and Lymph's at 8.80.   On review of systems, pt reports post nasal drip, persistent cough, occasional troublesome stomach, pain in his lower abdomen that feels like his muscles are irritated, reports he has lost 25 lbs in the last 2.5 years (he associates a decreased appetite after taking beta blockers during this period) and denies no recent colds or infections, acute changes in energy levels, and leg swelling.   On PMHx the pt reports taking Zetia. He has IB'S and has had 3 colonoscopies with no significant findings or inflammatory processes. He reports that over the last 2.5 years he had heart catheterizations that all turned out well. He reports having diabetes. He also reports neuropathy along his whole right side that lasted for a few months that ceased abruptly.   Interval History:   Eric Harrington is a wonderful gentleman who is here for continued evaluation and management of CLL. He notes no acute new symptoms since his last clinic visit. No fevers no chills no  night sweats no unexpected weight loss. No new focal bone pains.  No abdominal pain or distention.  No acute chest pain or shortness of breath. Labs done today were reviewed in detail with the patient.   MEDICAL HISTORY:  1. HTN 2. DM2 3. H/o PAC's 4. transient a fib with cardiac cath 5. HLD 6. CKD stage 3 7. Irritable bowel syndrome 8. GERD 9. H/o presyndope  SURGICAL HISTORY:  1. Prostate bx 2. Cardiac cath  SOCIAL HISTORY: Social History   Socioeconomic History   Marital status: Married    Spouse name: Not on file   Number of children: 2   Years of education: Not on file   Highest education level: Not on file  Occupational History   Not on file  Tobacco Use   Smoking status: Former    Packs/day: 1.00    Types: Cigarettes    Quit date: 1980    Years since quitting: 43.6   Smokeless tobacco: Never   Tobacco comments:    started at age 40  Vaping Use   Vaping Use: Never used  Substance and Sexual Activity   Alcohol use: Never   Drug use: Not Currently   Sexual activity: Not on file  Other Topics Concern   Not on file  Social History Narrative   Not on file   Social Determinants of Health   Financial Resource Strain: Not on file  Food Insecurity: Not on file  Transportation Needs: Not  on file  Physical Activity: Not on file  Stress: Not on file  Social Connections: Not on file  Intimate Partner Violence: Not on file    FAMILY HISTORY: Family History  Problem Relation Age of Onset   Lung cancer Mother    Heart attack Father    Dementia Sister    Diabetes Brother    Diabetes Sister    Heart attack Brother     ALLERGIES:  is allergic to [employer] hcl], cephalexin, gemfibrozil, glipizide, januvia [sitagliptin], lipitor [atorvastatin calcium], lisinopril, losartan, metoprolol, omeprazole, simvastatin, and gabapentin.  MEDICATIONS:  Current Outpatient Medications  Medication Sig Dispense Refill   AMBULATORY NON FORMULARY MEDICATION  Take by mouth 2 (two) times daily. Medication Name: Immuneti Advanced Immune Defense     Ascorbic Acid (VITAMIN C) 1000 MG tablet Take 1,000 mg by mouth 2 (two) times a day.     aspirin EC 81 MG tablet Take 81 mg by mouth daily.     B Complex Vitamins (VITAMIN-B COMPLEX PO) Take by mouth.     carboxymethylcellulose (REFRESH PLUS) 0.5 % SOLN 1 drop 3 (three) times daily as needed.     cyanocobalamin (VITAMIN B12) 1000 MCG tablet 1 tablet     docusate sodium (COLACE) 100 MG capsule Take 100 mg by mouth daily.      esomeprazole (NEXIUM) 20 MG capsule Take 20 mg by mouth daily at 12 noon.     FARXIGA 10 MG TABS tablet Take 10 mg by mouth daily.     folic acid (FOLVITE) 1 MG tablet TAKE 2 TABLETS BY MOUTH ONCE DAILY FOR 5 DAYS     glimepiride (AMARYL) 1 MG tablet Take 1 mg by mouth daily.     metFORMIN (GLUCOPHAGE) 1000 MG tablet Take 500 mg by mouth 2 (two) times daily.      Rosuvastatin Calcium 5 MG CPSP Take by mouth daily. 1 tab daily     VITAMIN D PO Take by mouth daily.     Glucose Blood (IGLUCOSE TEST STRIPS VI) by In Vitro route.     Homeopathic Products (ALLERGY MEDICINE PO) Take by mouth. (Patient not taking: Reported on 01/10/2022)     Lancets MISC by Does not apply route.     losartan (COZAAR) 25 MG tablet Take 25 mg by mouth daily after supper. (Patient not taking: Reported on 11/25/2020)     Riboflavin (VITAMIN B-2 PO) Take by mouth daily. (Patient not taking: Reported on 01/10/2022)     No current facility-administered medications for this visit.    REVIEW OF SYSTEMS:   10 Point review of Systems was done is negative except as noted above.   PHYSICAL EXAMINATION:  Vitals:   01/10/22 1155  BP: (!) 105/90  Pulse: 70  Resp: 15  Temp: 97.9 F (36.6 C)  SpO2: 99%    Filed Weights   01/10/22 1155  Weight: 174 lb 3.2 oz (79 kg)  NAD GENERAL:alert, in no acute distress and comfortable SKIN: no acute rashes, no significant lesions EYES: conjunctiva are pink and non-injected,  sclera anicteric OROPHARYNX: MMM, no exudates, no oropharyngeal erythema or ulceration NECK: supple, no JVD LYMPH:  no palpable lymphadenopathy in the cervical, axillary or inguinal regions LUNGS: clear to auscultation b/l with normal respiratory effort HEART: regular rate & rhythm ABDOMEN:  normoactive bowel sounds , non tender, not distended.  No palpable splenomegaly noted. Extremity: no pedal edema PSYCH: alert & oriented x 3 with fluent speech NEURO: no focal motor/sensory deficits LABORATORY DATA:  I have reviewed the data as listed  .    Latest Ref Rng & Units 01/10/2022   11:47 AM 05/26/2021   11:50 AM 11/25/2020   11:44 AM  CBC  WBC 4.0 - 10.5 K/uL 48.4  33.6  33.5   Hemoglobin 13.0 - 17.0 g/dL 12.4  13.1  13.7   Hematocrit 39.0 - 52.0 % 37.1  39.7  41.2   Platelets 150 - 400 K/uL 105  123  117     . CBC    Component Value Date/Time   WBC 48.4 (H) 01/10/2022 1147   WBC 24.6 (H) 10/29/2019 1331   RBC 3.97 (L) 01/10/2022 1147   HGB 12.4 (L) 01/10/2022 1147   HCT 37.1 (L) 01/10/2022 1147   PLT 105 (L) 01/10/2022 1147   MCV 93.5 01/10/2022 1147   MCH 31.2 01/10/2022 1147   MCHC 33.4 01/10/2022 1147   RDW 14.3 01/10/2022 1147   LYMPHSABS 44.5 (H) 01/10/2022 1147   MONOABS 2.5 (H) 01/10/2022 1147   EOSABS 0.0 01/10/2022 1147   BASOSABS 0.0 01/10/2022 1147   .Marland Kitchen Lab Results  Component Value Date   RETICCTPCT 1.4 04/24/2018   RBC 3.97 (L) 01/10/2022       Latest Ref Rng & Units 01/10/2022   11:47 AM 05/26/2021   11:50 AM 11/25/2020   11:44 AM  CMP  Glucose 70 - 99 mg/dL 142  152  136   BUN 8 - 23 mg/dL '17  18  15   '$ Creatinine 0.61 - 1.24 mg/dL 1.24  1.33  1.40   Sodium 135 - 145 mmol/L 135  136  139   Potassium 3.5 - 5.1 mmol/L 4.2  4.4  4.3   Chloride 98 - 111 mmol/L 103  103  104   CO2 22 - 32 mmol/L '27  24  26   '$ Calcium 8.9 - 10.3 mg/dL 9.5  9.5  9.5   Total Protein 6.5 - 8.1 g/dL 6.3  6.7  6.6   Total Bilirubin 0.3 - 1.2 mg/dL 0.5  0.5  0.4   Alkaline Phos  38 - 126 U/L 92  94  106   AST 15 - 41 U/L '13  15  14   '$ ALT 0 - 44 U/L '11  16  18    '$ . Lab Results  Component Value Date   LDH 128 01/10/2022   Component     Latest Ref Rng & Units 06/13/2017  HCV Ab     0.0 - 0.9 s/co ratio <0.1  Hepatitis B Surface Ag     Negative Negative  Hep B Core Ab, Tot     Negative Negative         RADIOGRAPHIC STUDIES: I have personally reviewed the radiological images as listed and agreed with the findings in the report. No results found.  ASSESSMENT & PLAN:   81 y.o. is a male with  1. Chronic lymphocytic leukemia. Likely Rai Stage 0, CLL FISH panel with 13 q. deletion  -he presented with Lymphocytosis incidentally noted on routine labs No associated significant anemia or thrombocytopenia. No constitutional symptoms No overt clinically palpable LNadenopathy or hepato-splenomegaly. 13q mutation present   08/28/17 CT C/A/P revealed Normal sized spleen, no enlarged lymph nodes. There is evidence of very small lung nodule, likely related to inflammatory process. Will monitor with CT chest in 12 months   PLAN:  -Patient's labs done today were discussed in detail with him. CBC shows gradual increase in his WBC count/lymphocytosis now  up to 48.4k normal hemoglobin of 12.4 and platelets of 104k -CMP stable Patient has no clear lab or clinical evidence of needing treatment for his CLL at this time. Continue age-appropriate vaccinations and follow-up  FOLLOW UP: RTC with Dr Irene Limbo with labs in 6 months  The total time spent in the appointment was 600 minutes*.  All of the patient's questions were answered with apparent satisfaction. The patient knows to call the clinic with any problems, questions or concerns.   Eric Lone MD MS AAHIVMS [facility] Hematology/Oncology Physician [facility]  .*Total Encounter Time as defined by the Centers for Medicare and Medicaid Services includes, in addition to the face-to-face time of a  patient visit (documented in the note above) non-face-to-face time: obtaining and reviewing outside history, ordering and reviewing medications, tests or procedures, care coordination (communications with other health care professionals or caregivers) and documentation in the medical record.

## 2022-01-19 DIAGNOSIS — M5451 Vertebrogenic low back pain: Secondary | ICD-10-CM | POA: Diagnosis not present

## 2022-01-24 DIAGNOSIS — M25551 Pain in right hip: Secondary | ICD-10-CM | POA: Diagnosis not present

## 2022-01-24 DIAGNOSIS — M545 Low back pain, unspecified: Secondary | ICD-10-CM | POA: Diagnosis not present

## 2022-01-24 DIAGNOSIS — M25561 Pain in right knee: Secondary | ICD-10-CM | POA: Diagnosis not present

## 2022-01-26 DIAGNOSIS — M25561 Pain in right knee: Secondary | ICD-10-CM | POA: Diagnosis not present

## 2022-01-26 DIAGNOSIS — M25551 Pain in right hip: Secondary | ICD-10-CM | POA: Diagnosis not present

## 2022-01-26 DIAGNOSIS — M545 Low back pain, unspecified: Secondary | ICD-10-CM | POA: Diagnosis not present

## 2022-01-29 DIAGNOSIS — M25551 Pain in right hip: Secondary | ICD-10-CM | POA: Diagnosis not present

## 2022-01-29 DIAGNOSIS — M545 Low back pain, unspecified: Secondary | ICD-10-CM | POA: Diagnosis not present

## 2022-01-29 DIAGNOSIS — M25561 Pain in right knee: Secondary | ICD-10-CM | POA: Diagnosis not present

## 2022-01-31 DIAGNOSIS — J302 Other seasonal allergic rhinitis: Secondary | ICD-10-CM | POA: Diagnosis not present

## 2022-01-31 DIAGNOSIS — E1122 Type 2 diabetes mellitus with diabetic chronic kidney disease: Secondary | ICD-10-CM | POA: Diagnosis not present

## 2022-01-31 DIAGNOSIS — J019 Acute sinusitis, unspecified: Secondary | ICD-10-CM | POA: Diagnosis not present

## 2022-02-01 DIAGNOSIS — M25561 Pain in right knee: Secondary | ICD-10-CM | POA: Diagnosis not present

## 2022-02-01 DIAGNOSIS — M545 Low back pain, unspecified: Secondary | ICD-10-CM | POA: Diagnosis not present

## 2022-02-01 DIAGNOSIS — M25551 Pain in right hip: Secondary | ICD-10-CM | POA: Diagnosis not present

## 2022-02-05 DIAGNOSIS — M5451 Vertebrogenic low back pain: Secondary | ICD-10-CM | POA: Diagnosis not present

## 2022-02-12 DIAGNOSIS — M5451 Vertebrogenic low back pain: Secondary | ICD-10-CM | POA: Diagnosis not present

## 2022-02-14 DIAGNOSIS — M5451 Vertebrogenic low back pain: Secondary | ICD-10-CM | POA: Diagnosis not present

## 2022-02-16 DIAGNOSIS — M5451 Vertebrogenic low back pain: Secondary | ICD-10-CM | POA: Diagnosis not present

## 2022-02-20 DIAGNOSIS — L570 Actinic keratosis: Secondary | ICD-10-CM | POA: Diagnosis not present

## 2022-02-20 DIAGNOSIS — D485 Neoplasm of uncertain behavior of skin: Secondary | ICD-10-CM | POA: Diagnosis not present

## 2022-02-20 DIAGNOSIS — C44329 Squamous cell carcinoma of skin of other parts of face: Secondary | ICD-10-CM | POA: Diagnosis not present

## 2022-04-03 DIAGNOSIS — C44329 Squamous cell carcinoma of skin of other parts of face: Secondary | ICD-10-CM | POA: Diagnosis not present

## 2022-04-09 DIAGNOSIS — L905 Scar conditions and fibrosis of skin: Secondary | ICD-10-CM | POA: Diagnosis not present

## 2022-04-26 DIAGNOSIS — C9190 Lymphoid leukemia, unspecified not having achieved remission: Secondary | ICD-10-CM | POA: Diagnosis not present

## 2022-04-26 DIAGNOSIS — E1122 Type 2 diabetes mellitus with diabetic chronic kidney disease: Secondary | ICD-10-CM | POA: Diagnosis not present

## 2022-04-26 DIAGNOSIS — I10 Essential (primary) hypertension: Secondary | ICD-10-CM | POA: Diagnosis not present

## 2022-04-27 DIAGNOSIS — E1122 Type 2 diabetes mellitus with diabetic chronic kidney disease: Secondary | ICD-10-CM | POA: Diagnosis not present

## 2022-04-27 DIAGNOSIS — Z23 Encounter for immunization: Secondary | ICD-10-CM | POA: Diagnosis not present

## 2022-04-27 DIAGNOSIS — I2510 Atherosclerotic heart disease of native coronary artery without angina pectoris: Secondary | ICD-10-CM | POA: Diagnosis not present

## 2022-04-27 DIAGNOSIS — I10 Essential (primary) hypertension: Secondary | ICD-10-CM | POA: Diagnosis not present

## 2022-04-27 DIAGNOSIS — C9190 Lymphoid leukemia, unspecified not having achieved remission: Secondary | ICD-10-CM | POA: Diagnosis not present

## 2022-05-18 DIAGNOSIS — R059 Cough, unspecified: Secondary | ICD-10-CM | POA: Diagnosis not present

## 2022-05-18 DIAGNOSIS — R0981 Nasal congestion: Secondary | ICD-10-CM | POA: Diagnosis not present

## 2022-05-18 DIAGNOSIS — Z03818 Encounter for observation for suspected exposure to other biological agents ruled out: Secondary | ICD-10-CM | POA: Diagnosis not present

## 2022-05-18 DIAGNOSIS — R5383 Other fatigue: Secondary | ICD-10-CM | POA: Diagnosis not present

## 2022-05-18 DIAGNOSIS — R6889 Other general symptoms and signs: Secondary | ICD-10-CM | POA: Diagnosis not present

## 2022-05-18 DIAGNOSIS — R509 Fever, unspecified: Secondary | ICD-10-CM | POA: Diagnosis not present

## 2022-05-18 DIAGNOSIS — R52 Pain, unspecified: Secondary | ICD-10-CM | POA: Diagnosis not present

## 2022-05-18 DIAGNOSIS — R051 Acute cough: Secondary | ICD-10-CM | POA: Diagnosis not present

## 2022-05-22 ENCOUNTER — Encounter (HOSPITAL_BASED_OUTPATIENT_CLINIC_OR_DEPARTMENT_OTHER): Payer: Self-pay | Admitting: Emergency Medicine

## 2022-05-22 ENCOUNTER — Emergency Department (HOSPITAL_BASED_OUTPATIENT_CLINIC_OR_DEPARTMENT_OTHER): Payer: Medicare Other

## 2022-05-22 ENCOUNTER — Emergency Department (HOSPITAL_BASED_OUTPATIENT_CLINIC_OR_DEPARTMENT_OTHER)
Admission: EM | Admit: 2022-05-22 | Discharge: 2022-05-22 | Disposition: A | Discharge status: Home / Self Care | Payer: Medicare Other | Attending: Emergency Medicine | Admitting: Emergency Medicine

## 2022-05-22 DIAGNOSIS — I10 Essential (primary) hypertension: Secondary | ICD-10-CM | POA: Diagnosis not present

## 2022-05-22 DIAGNOSIS — Z856 Personal history of leukemia: Secondary | ICD-10-CM | POA: Diagnosis not present

## 2022-05-22 DIAGNOSIS — Z1152 Encounter for screening for COVID-19: Secondary | ICD-10-CM | POA: Insufficient documentation

## 2022-05-22 DIAGNOSIS — E119 Type 2 diabetes mellitus without complications: Secondary | ICD-10-CM | POA: Diagnosis not present

## 2022-05-22 DIAGNOSIS — D72829 Elevated white blood cell count, unspecified: Secondary | ICD-10-CM | POA: Insufficient documentation

## 2022-05-22 DIAGNOSIS — R7989 Other specified abnormal findings of blood chemistry: Secondary | ICD-10-CM | POA: Insufficient documentation

## 2022-05-22 DIAGNOSIS — R300 Dysuria: Secondary | ICD-10-CM | POA: Insufficient documentation

## 2022-05-22 DIAGNOSIS — R059 Cough, unspecified: Secondary | ICD-10-CM | POA: Diagnosis present

## 2022-05-22 DIAGNOSIS — J069 Acute upper respiratory infection, unspecified: Secondary | ICD-10-CM | POA: Diagnosis not present

## 2022-05-22 LAB — CBC WITH DIFFERENTIAL/PLATELET
Abs Immature Granulocytes: 0.02 10*3/uL (ref 0.00–0.07)
Basophils Absolute: 0.1 10*3/uL (ref 0.0–0.1)
Basophils Relative: 0 %
Eosinophils Absolute: 0 10*3/uL (ref 0.0–0.5)
Eosinophils Relative: 0 %
HCT: 32.7 % — ABNORMAL LOW (ref 39.0–52.0)
Hemoglobin: 10.7 g/dL — ABNORMAL LOW (ref 13.0–17.0)
Immature Granulocytes: 0 %
Lymphocytes Relative: 90 %
Lymphs Abs: 41 10*3/uL — ABNORMAL HIGH (ref 0.7–4.0)
MCH: 31.8 pg (ref 26.0–34.0)
MCHC: 32.7 g/dL (ref 30.0–36.0)
MCV: 97 fL (ref 80.0–100.0)
Monocytes Absolute: 2.3 10*3/uL — ABNORMAL HIGH (ref 0.1–1.0)
Monocytes Relative: 5 %
Neutro Abs: 2.4 10*3/uL (ref 1.7–7.7)
Neutrophils Relative %: 5 %
Platelets: 114 10*3/uL — ABNORMAL LOW (ref 150–400)
RBC: 3.37 MIL/uL — ABNORMAL LOW (ref 4.22–5.81)
RDW: 13.7 % (ref 11.5–15.5)
Smear Review: NORMAL
WBC: 45.8 10*3/uL — ABNORMAL HIGH (ref 4.0–10.5)
nRBC: 0 % (ref 0.0–0.2)

## 2022-05-22 LAB — URINALYSIS, ROUTINE W REFLEX MICROSCOPIC
Bilirubin Urine: NEGATIVE
Glucose, UA: >=500 mg/dL — AB
Hgb urine dipstick: NEGATIVE
Ketones, ur: NEGATIVE mg/dL
Leukocytes,Ua: NEGATIVE
Nitrite: NEGATIVE
Protein, ur: 100 mg/dL — AB
Specific Gravity, Urine: 1.02 (ref 1.005–1.030)
pH: 5 (ref 5.0–8.0)

## 2022-05-22 LAB — COMPREHENSIVE METABOLIC PANEL
ALT: 8 U/L (ref 0–44)
AST: 12 U/L — ABNORMAL LOW (ref 15–41)
Albumin: 3.4 g/dL — ABNORMAL LOW (ref 3.5–5.0)
Alkaline Phosphatase: 75 U/L (ref 38–126)
Anion gap: 11 (ref 5–15)
BUN: 24 mg/dL — ABNORMAL HIGH (ref 8–23)
CO2: 24 mmol/L (ref 22–32)
Calcium: 8.8 mg/dL — ABNORMAL LOW (ref 8.9–10.3)
Chloride: 98 mmol/L (ref 98–111)
Creatinine, Ser: 1.42 mg/dL — ABNORMAL HIGH (ref 0.61–1.24)
GFR, Estimated: 50 mL/min — ABNORMAL LOW (ref >60)
Glucose, Bld: 119 mg/dL — ABNORMAL HIGH (ref 70–99)
Potassium: 3.8 mmol/L (ref 3.5–5.1)
Sodium: 133 mmol/L — ABNORMAL LOW (ref 135–145)
Total Bilirubin: 0.6 mg/dL (ref 0.3–1.2)
Total Protein: 6.8 g/dL (ref 6.5–8.1)

## 2022-05-22 LAB — RESP PANEL BY RT-PCR (RSV, FLU A&B, COVID)  RVPGX2
Influenza A by PCR: NEGATIVE
Influenza B by PCR: NEGATIVE
Resp Syncytial Virus by PCR: NEGATIVE
SARS Coronavirus 2 by RT PCR: NEGATIVE

## 2022-05-22 LAB — LACTIC ACID, PLASMA: Lactic Acid, Venous: 1.1 mmol/L (ref 0.5–1.9)

## 2022-05-22 LAB — URINALYSIS, MICROSCOPIC (REFLEX)

## 2022-05-22 MED ORDER — SODIUM CHLORIDE 0.9 % IV BOLUS
500.0000 mL | Freq: Once | INTRAVENOUS | Status: AC
  Administered 2022-05-22: 500 mL via INTRAVENOUS

## 2022-05-22 NOTE — ED Provider Notes (Signed)
Emergency Department Provider Note   I have reviewed the triage vital signs and the nursing notes.   HISTORY  Chief Complaint Cough   HPI Jaythen Hamme is a 82 y.o. male past history of diabetes, hyperlipidemia, hypertension, chronic leukemia presents to the emergency department with cough/congestion over the past 7 days.  He was sent here by his PCP [facility]) for low blood pressures in the office.  He had an evaluation today including exam and some viral testing but had multiple blood pressures in the 80 systolic range.  He has been feeling generalized fatigue.  Denies chest pain.  He states compared to how he felt yesterday today is improved.  No nausea, vomiting, diarrhea.  No known active GI bleeding or other bleeding.   Past Medical History:  Diagnosis Date   Chronic Leukemia    Diabetes mellitus without complication ([place])    Hyperlipemia 12/02/2018   Hyperlipidemia    Hypertension    Type 2 diabetes mellitus with complication, without Barnaby Rippeon-term current use of insulin ([place]) 12/02/2018    Review of Systems  Constitutional: No fever/chills. Generalized weakness.  Cardiovascular: Denies chest pain. Respiratory: Denies shortness of breath. Gastrointestinal: No abdominal pain.  No nausea, no vomiting.  No diarrhea.  No constipation. Genitourinary: Negative for dysuria. Musculoskeletal: Negative for back pain. Skin: Negative for rash. Neurological: Negative for headaches, focal weakness or numbness.  ____________________________________________   PHYSICAL EXAM:  VITAL SIGNS: ED Triage Vitals  Enc Vitals Group     BP 05/22/22 1229 101/63     Pulse Rate 05/22/22 1229 89     Resp 05/22/22 1229 18     Temp 05/22/22 1229 98.2 F (36.8 C)     Temp Source 05/22/22 1229 Oral     SpO2 05/22/22 1229 98 %     Weight 05/22/22 1223 174 lb (78.9 kg)     Height 05/22/22 1223 '5\' 10"'$  (1.778 m)   Constitutional: Alert and oriented. Well appearing and in no acute  distress. Eyes: Conjunctivae are normal.  Head: Atraumatic. Nose: No congestion/rhinnorhea. Mouth/Throat: Mucous membranes are slightly dry.  Neck: No stridor.   Cardiovascular: Normal rate, regular rhythm. Good peripheral circulation. Grossly normal heart sounds.   Respiratory: Normal respiratory effort.  No retractions. Lungs CTAB. Gastrointestinal: Soft and nontender. No distention.  Musculoskeletal: No lower extremity tenderness nor edema. No gross deformities of extremities. Neurologic:  Normal speech and language. No gross focal neurologic deficits are appreciated.  Skin:  Skin is warm, dry and intact. No rash noted.  ____________________________________________   LABS (all labs ordered are listed, but only abnormal results are displayed)  Labs Reviewed  RESP PANEL BY RT-PCR (RSV, FLU A&B, COVID)  RVPGX2  URINE CULTURE  COMPREHENSIVE METABOLIC PANEL  CBC WITH DIFFERENTIAL/PLATELET  LACTIC ACID, PLASMA  LACTIC ACID, PLASMA  URINALYSIS, ROUTINE W REFLEX MICROSCOPIC   ____________________________________________  RADIOLOGY  DG Chest 2 View  Result Date: 05/22/2022 CLINICAL DATA:  Cough.  Fever. EXAM: CHEST - 2 VIEW COMPARISON:  CXR 03/06/21 FINDINGS: No pleural effusion. No pneumothorax. Unchanged cardiac and mediastinal contours. No focal airspace opacity. No displaced rib fractures. Visualized upper abdomen is unremarkable. IMPRESSION: No focal airspace opacity. Electronically Signed   By: Marin Roberts M.D.   On: 05/22/2022 12:45    ____________________________________________   PROCEDURES  Procedure(s) performed:   Procedures  None  ____________________________________________   INITIAL IMPRESSION / ASSESSMENT AND PLAN / ED COURSE  Pertinent labs & imaging results that were available during my care  of the patient were reviewed by me and considered in my medical decision making (see chart for details).   This patient is Presenting for Evaluation of  weakness/URI symptoms, which does require a range of treatment options, and is a complaint that involves a high risk of morbidity and mortality.  The Differential Diagnoses include COVID, FLU, RSV, CAP, etc.  Critical Interventions-    Medications  sodium chloride 0.9 % bolus 500 mL (500 mLs Intravenous New Bag/Given 05/22/22 1458)    Reassessment after intervention:     I *** Additional Historical Information from ***, as the patient is ***.  I decided to review pertinent External Data, and in summary ***.   Clinical Laboratory Tests Ordered, included   Radiologic Tests Ordered, included ***. I independently interpreted the images and agree with radiology interpretation.   Cardiac Monitor Tracing which shows ***   Social Determinants of Health Risk ***  Consult complete with  Medical Decision Making: Summary:  Patient presents emergency department with upper respiratory infection symptoms over the past week.  Chest x-ray does not show infiltrate or concern for developing pneumonia.  Vital signs here are reassuring.  Patient is not hypotensive currently but has documentation at bedside showing blood pressure in the 80s at his PCP office.  I doubt sepsis but given age and abnormal vital signs as an outpatient plan for lab work including lactic acid.  If vitals and labs are reassuring plan for discharge with continued supportive care at home.  Reevaluation with update and discussion with   ***Considered admission***  Patient's presentation is most consistent with {EM COPA:27473}   Disposition:   ____________________________________________  FINAL CLINICAL IMPRESSION(S) / ED DIAGNOSES  Final diagnoses:  None     NEW OUTPATIENT MEDICATIONS STARTED DURING THIS VISIT:  New Prescriptions   No medications on file    Note:  This document was prepared using Dragon voice recognition software and may include unintentional dictation errors.  Nanda Quinton, MD, [facility] Emergency  Medicine

## 2022-05-22 NOTE — Discharge Instructions (Signed)
Drink plenty of fluids and continue to manage your symptoms at home.  Your workup here is reassuring.  Return with any new or suddenly worsening symptoms.

## 2022-05-22 NOTE — ED Triage Notes (Signed)
Pt w/ cough, fever, congestion, body aches x 1 wk

## 2022-05-24 LAB — URINE CULTURE: Culture: NO GROWTH

## 2022-06-04 ENCOUNTER — Encounter (HOSPITAL_COMMUNITY): Payer: Self-pay

## 2022-06-04 ENCOUNTER — Observation Stay (HOSPITAL_BASED_OUTPATIENT_CLINIC_OR_DEPARTMENT_OTHER)
Admission: EM | Admit: 2022-06-04 | Discharge: 2022-06-05 | Disposition: A | Discharge status: Home / Self Care | Payer: Medicare Other | Attending: Family Medicine | Admitting: Family Medicine

## 2022-06-04 ENCOUNTER — Encounter (HOSPITAL_BASED_OUTPATIENT_CLINIC_OR_DEPARTMENT_OTHER): Payer: Self-pay

## 2022-06-04 ENCOUNTER — Emergency Department (HOSPITAL_BASED_OUTPATIENT_CLINIC_OR_DEPARTMENT_OTHER): Payer: Medicare Other

## 2022-06-04 DIAGNOSIS — Z87891 Personal history of nicotine dependence: Secondary | ICD-10-CM | POA: Insufficient documentation

## 2022-06-04 DIAGNOSIS — Z7982 Long term (current) use of aspirin: Secondary | ICD-10-CM | POA: Insufficient documentation

## 2022-06-04 DIAGNOSIS — R059 Cough, unspecified: Secondary | ICD-10-CM | POA: Insufficient documentation

## 2022-06-04 DIAGNOSIS — Z79899 Other long term (current) drug therapy: Secondary | ICD-10-CM | POA: Insufficient documentation

## 2022-06-04 DIAGNOSIS — N1832 Chronic kidney disease, stage 3b: Secondary | ICD-10-CM | POA: Diagnosis not present

## 2022-06-04 DIAGNOSIS — Z7901 Long term (current) use of anticoagulants: Secondary | ICD-10-CM | POA: Insufficient documentation

## 2022-06-04 DIAGNOSIS — Z1152 Encounter for screening for COVID-19: Secondary | ICD-10-CM | POA: Diagnosis not present

## 2022-06-04 DIAGNOSIS — R531 Weakness: Secondary | ICD-10-CM

## 2022-06-04 DIAGNOSIS — E1142 Type 2 diabetes mellitus with diabetic polyneuropathy: Secondary | ICD-10-CM | POA: Diagnosis not present

## 2022-06-04 DIAGNOSIS — R7989 Other specified abnormal findings of blood chemistry: Secondary | ICD-10-CM | POA: Diagnosis not present

## 2022-06-04 DIAGNOSIS — I480 Paroxysmal atrial fibrillation: Principal | ICD-10-CM | POA: Insufficient documentation

## 2022-06-04 DIAGNOSIS — I4891 Unspecified atrial fibrillation: Secondary | ICD-10-CM

## 2022-06-04 DIAGNOSIS — E1122 Type 2 diabetes mellitus with diabetic chronic kidney disease: Secondary | ICD-10-CM | POA: Diagnosis not present

## 2022-06-04 DIAGNOSIS — Z7984 Long term (current) use of oral hypoglycemic drugs: Secondary | ICD-10-CM | POA: Diagnosis not present

## 2022-06-04 DIAGNOSIS — H1032 Unspecified acute conjunctivitis, left eye: Secondary | ICD-10-CM | POA: Diagnosis not present

## 2022-06-04 DIAGNOSIS — Z856 Personal history of leukemia: Secondary | ICD-10-CM | POA: Diagnosis not present

## 2022-06-04 DIAGNOSIS — E1165 Type 2 diabetes mellitus with hyperglycemia: Secondary | ICD-10-CM | POA: Diagnosis not present

## 2022-06-04 DIAGNOSIS — R5383 Other fatigue: Secondary | ICD-10-CM | POA: Diagnosis present

## 2022-06-04 LAB — BASIC METABOLIC PANEL
Anion gap: 9 (ref 5–15)
BUN: 29 mg/dL — ABNORMAL HIGH (ref 8–23)
CO2: 26 mmol/L (ref 22–32)
Calcium: 8.3 mg/dL — ABNORMAL LOW (ref 8.9–10.3)
Chloride: 97 mmol/L — ABNORMAL LOW (ref 98–111)
Creatinine, Ser: 1.38 mg/dL — ABNORMAL HIGH (ref 0.61–1.24)
GFR, Estimated: 52 mL/min — ABNORMAL LOW (ref >60)
Glucose, Bld: 148 mg/dL — ABNORMAL HIGH (ref 70–99)
Potassium: 3.9 mmol/L (ref 3.5–5.1)
Sodium: 132 mmol/L — ABNORMAL LOW (ref 135–145)

## 2022-06-04 LAB — URINALYSIS, ROUTINE W REFLEX MICROSCOPIC
Bilirubin Urine: NEGATIVE
Glucose, UA: >=500 mg/dL — AB
Hgb urine dipstick: NEGATIVE
Ketones, ur: NEGATIVE mg/dL
Leukocytes,Ua: NEGATIVE
Nitrite: NEGATIVE
Protein, ur: NEGATIVE mg/dL
Specific Gravity, Urine: 1.015 (ref 1.005–1.030)
pH: 5.5 (ref 5.0–8.0)

## 2022-06-04 LAB — CBC
HCT: 35 % — ABNORMAL LOW (ref 39.0–52.0)
Hemoglobin: 11.1 g/dL — ABNORMAL LOW (ref 13.0–17.0)
MCH: 31 pg (ref 26.0–34.0)
MCHC: 31.7 g/dL (ref 30.0–36.0)
MCV: 97.8 fL (ref 80.0–100.0)
Platelets: 213 10*3/uL (ref 150–400)
RBC: 3.58 MIL/uL — ABNORMAL LOW (ref 4.22–5.81)
RDW: 13.9 % (ref 11.5–15.5)
WBC: 61.8 10*3/uL (ref 4.0–10.5)
nRBC: 0 % (ref 0.0–0.2)

## 2022-06-04 LAB — URINALYSIS, MICROSCOPIC (REFLEX)

## 2022-06-04 LAB — RESP PANEL BY RT-PCR (RSV, FLU A&B, COVID)  RVPGX2
Influenza A by PCR: NEGATIVE
Influenza B by PCR: NEGATIVE
Resp Syncytial Virus by PCR: NEGATIVE
SARS Coronavirus 2 by RT PCR: NEGATIVE

## 2022-06-04 LAB — CBG MONITORING, ED: Glucose-Capillary: 152 mg/dL — ABNORMAL HIGH (ref 70–99)

## 2022-06-04 LAB — TROPONIN I (HIGH SENSITIVITY)
Troponin I (High Sensitivity): 21 ng/L — ABNORMAL HIGH (ref <18)
Troponin I (High Sensitivity): 18 ng/L — ABNORMAL HIGH (ref <18)

## 2022-06-04 LAB — HEPARIN LEVEL (UNFRACTIONATED): Heparin Unfractionated: <0.1 IU/mL — ABNORMAL LOW (ref 0.30–0.70)

## 2022-06-04 MED ORDER — HEPARIN (PORCINE) 25000 UT/250ML-% IV SOLN
1300.0000 [IU]/h | INTRAVENOUS | Status: DC
  Administered 2022-06-04: 1100 [IU]/h via INTRAVENOUS
  Administered 2022-06-05: 1300 [IU]/h via INTRAVENOUS
  Filled 2022-06-04 (×2): qty 250

## 2022-06-04 MED ORDER — SODIUM CHLORIDE 0.9 % IV BOLUS
500.0000 mL | Freq: Once | INTRAVENOUS | Status: AC
  Administered 2022-06-04: 500 mL via INTRAVENOUS

## 2022-06-04 MED ORDER — AMIODARONE IV BOLUS ONLY 150 MG/100ML
150.0000 mg | Freq: Once | INTRAVENOUS | Status: AC
  Administered 2022-06-04: 150 mg via INTRAVENOUS
  Filled 2022-06-04: qty 100

## 2022-06-04 MED ORDER — AMIODARONE HCL IN DEXTROSE 360-4.14 MG/200ML-% IV SOLN: 30.0000 mg/h | INTRAVENOUS | Status: DC

## 2022-06-04 MED ORDER — AMIODARONE LOAD VIA INFUSION: 150.0000 mg | Freq: Once | INTRAVENOUS | Status: DC

## 2022-06-04 MED ORDER — HEPARIN BOLUS VIA INFUSION
4500.0000 [IU] | Freq: Once | INTRAVENOUS | Status: AC
  Administered 2022-06-04: 4500 [IU] via INTRAVENOUS

## 2022-06-04 MED ORDER — AMIODARONE HCL IN DEXTROSE 360-4.14 MG/200ML-% IV SOLN
60.0000 mg/h | INTRAVENOUS | Status: DC
  Administered 2022-06-04: 60 mg/h via INTRAVENOUS
  Filled 2022-06-04: qty 200

## 2022-06-04 MED ORDER — HEPARIN BOLUS VIA INFUSION
2000.0000 [IU] | Freq: Once | INTRAVENOUS | Status: AC
  Administered 2022-06-04: 2000 [IU] via INTRAVENOUS

## 2022-06-04 NOTE — ED Provider Notes (Addendum)
[place] EMERGENCY DEPARTMENT Provider Note   CSN: [id number] Arrival date & time: 06/04/22  6803     History  Chief Complaint  Patient presents with   Weakness    CHUONG CASEBEER is a 82 y.o. male.  Patient is a 82 year old male who presents with generalized weakness.  History is obtained from the patient and his wife.  He says he started feeling sick about 3 weeks ago with cough and cold symptoms.  He was seen in the ED on January 2.  He was given IV fluids.  There is no signs of pneumonia.  He was diagnosed with a viral syndrome.  However the next day his primary care doctor prescribed him Zithromax and prednisone which he took.  Overall it sounds like his cough is getting better although he still has a cough.  He still has some pressure in his sinuses.  No known fevers.  He generally just feels weak all over.  Yesterday he felt a little weaker and was stumbling a little bit in the house.  His blood pressure was noted to be low in the 21Y and 24M systolic.  His wife notes he has had a little bit of confusion intermittently over the last 3 weeks during this viral syndrome episode.  No vomiting or diarrhea.  No urinary symptoms.  No chest pain other than yesterday had some chest pain during a bad coughing spell but no other chest pain.       Home Medications Prior to Admission medications   Medication Sig Start Date End Date Taking? Authorizing Provider  AMBULATORY NON FORMULARY MEDICATION Take by mouth 2 (two) times daily. Medication Name: Immuneti [employer]    [provider]  Ascorbic Acid (VITAMIN C) 1000 MG tablet Take 1,000 mg by mouth 2 (two) times a day.    [provider]  aspirin EC 81 MG tablet Take 81 mg by mouth daily.    [provider]  B Complex Vitamins (VITAMIN-B COMPLEX PO) Take by mouth.    [provider]  carboxymethylcellulose (REFRESH PLUS) 0.5 % SOLN 1 drop 3 (three) times daily as needed.     [provider]  cyanocobalamin (VITAMIN B12) 1000 MCG tablet 1 tablet    [provider]  docusate sodium (COLACE) 100 MG capsule Take 100 mg by mouth daily.     [provider]  esomeprazole (NEXIUM) 20 MG capsule Take 20 mg by mouth daily at 12 noon.    [provider]  FARXIGA 10 MG TABS tablet Take 10 mg by mouth daily. 11/21/20   [provider]  folic acid (FOLVITE) 1 MG tablet TAKE 2 TABLETS BY MOUTH ONCE DAILY FOR 5 DAYS 02/19/21   [provider]  glimepiride (AMARYL) 1 MG tablet Take 1 mg by mouth daily. 11/14/20   [provider]  Glucose Blood (IGLUCOSE TEST STRIPS VI) by In Vitro route.    [provider]  Homeopathic Products (ALLERGY MEDICINE PO) Take by mouth. Patient not taking: Reported on 01/10/2022    [provider]  Lancets MISC by Does not apply route.    [provider]  losartan (COZAAR) 25 MG tablet Take 25 mg by mouth daily after supper. Patient not taking: Reported on 11/25/2020    [provider]  metFORMIN (GLUCOPHAGE) 1000 MG tablet Take 500 mg by mouth 2 (two) times daily.  05/06/17   [provider]  Riboflavin (VITAMIN B-2 PO) Take by mouth  daily. Patient not taking: Reported on 01/10/2022    [provider]  Rosuvastatin Calcium 5 MG CPSP Take by mouth daily. 1 tab daily    [provider]  VITAMIN D PO Take by mouth daily.    [provider]      Allergies    Bystolic [nebivolol hcl], Cephalexin, Gemfibrozil, Glipizide, Januvia [sitagliptin], Lipitor [atorvastatin calcium], Lisinopril, Losartan, Metoprolol, Omeprazole, Simvastatin, and Gabapentin    Review of Systems   Review of Systems  Constitutional:  Positive for fatigue. Negative for chills, diaphoresis and fever.  HENT:  Positive for congestion. Negative for rhinorrhea and sneezing.   Eyes: Negative.   Respiratory:  Positive for cough. Negative for chest tightness and  shortness of breath.   Cardiovascular:  Positive for chest pain. Negative for leg swelling.  Gastrointestinal:  Negative for abdominal pain, blood in stool, diarrhea, nausea and vomiting.  Genitourinary:  Negative for difficulty urinating, flank pain, frequency and hematuria.  Musculoskeletal:  Negative for arthralgias and back pain.  Skin:  Negative for rash.  Neurological:  Positive for weakness (Generalized). Negative for dizziness, speech difficulty, numbness and headaches.    Physical Exam Updated Vital Signs BP 114/71   Pulse (!) 109   Temp (!) 97.4 F (36.3 C) (Oral)   Resp (!) 21   Ht '5\' 10"'$  (1.778 m)   Wt 77.4 kg   SpO2 99%   BMI 24.49 kg/m  Physical Exam Constitutional:      Appearance: He is well-developed.  HENT:     Head: Normocephalic and atraumatic.  Eyes:     Pupils: Pupils are equal, round, and reactive to light.     Comments: Some erythema to the left eye conjunctiva with some watery discharge.  No swelling around the eye or erythema around the eye.  No pain with range of motion of the eye.  Cardiovascular:     Rate and Rhythm: Tachycardia present. Rhythm irregular.     Heart sounds: Normal heart sounds.  Pulmonary:     Effort: Pulmonary effort is normal. No respiratory distress.     Breath sounds: Normal breath sounds. No wheezing or rales.  Chest:     Chest wall: No tenderness.  Abdominal:     General: Bowel sounds are normal.     Palpations: Abdomen is soft.     Tenderness: There is no abdominal tenderness. There is no guarding or rebound.  Musculoskeletal:        General: Normal range of motion.     Cervical back: Normal range of motion and neck supple.     Comments: No edema or calf tenderness  Lymphadenopathy:     Cervical: No cervical adenopathy.  Skin:    General: Skin is warm and dry.     Findings: No rash.  Neurological:     Mental Status: He is alert and oriented to person, place, and time.     ED Results / Procedures / Treatments    Labs (all labs ordered are listed, but only abnormal results are displayed) Labs Reviewed  BASIC METABOLIC PANEL - Abnormal; Notable for the following components:      Result Value   Sodium 132 (*)    Chloride 97 (*)    Glucose, Bld 148 (*)    BUN 29 (*)    Creatinine, Ser 1.38 (*)    Calcium 8.3 (*)    GFR, Estimated 52 (*)    All other components within normal limits  CBC - Abnormal; Notable  for the following components:   WBC 61.8 (*)    RBC 3.58 (*)    Hemoglobin 11.1 (*)    HCT 35.0 (*)    All other components within normal limits  URINALYSIS, ROUTINE W REFLEX MICROSCOPIC - Abnormal; Notable for the following components:   Glucose, UA >=500 (*)    All other components within normal limits  URINALYSIS, MICROSCOPIC (REFLEX) - Abnormal; Notable for the following components:   Bacteria, UA RARE (*)    All other components within normal limits  CBG MONITORING, ED - Abnormal; Notable for the following components:   Glucose-Capillary 152 (*)    All other components within normal limits  TROPONIN I (HIGH SENSITIVITY) - Abnormal; Notable for the following components:   Troponin I (High Sensitivity) 21 (*)    All other components within normal limits  TROPONIN I (HIGH SENSITIVITY) - Abnormal; Notable for the following components:   Troponin I (High Sensitivity) 18 (*)    All other components within normal limits  RESP PANEL BY RT-PCR (RSV, FLU A&B, COVID)  RVPGX2  HEPARIN LEVEL (UNFRACTIONATED)    EKG EKG Interpretation  Date/Time:  Monday June 04 2022 07:39:30 EST Ventricular Rate:  121 PR Interval:    QRS Duration: 104 QT Interval:  345 QTC Calculation: 490 R Axis:   -38 Text Interpretation: Atrial fibrillation Left axis deviation Anteroseptal infarct, old Confirmed by Malvin Johns [phone]) on 06/04/2022 8:03:35 AM  Radiology DG Chest Portable 1 View  Result Date: 06/04/2022 CLINICAL DATA:  82 year old male with cough for 3 weeks.  Fatigue. EXAM: PORTABLE CHEST  1 VIEW COMPARISON:  Chest radiographs 05/22/2022 and earlier. FINDINGS: Portable AP semi upright view at 0730 hours. Mildly rotated to the right. Lung volumes and mediastinal contours are stable, within normal limits. Allowing for portable technique the lungs are clear. No pneumothorax or pleural effusion. No osseous abnormality identified. IMPRESSION: Negative portable chest. Electronically Signed   By: Genevie Ann M.D.   On: 06/04/2022 08:00    Procedures Procedures    Medications Ordered in ED Medications  heparin ADULT infusion 100 units/mL (25000 units/249m) (1,100 Units/hr Intravenous New Bag/Given 06/04/22 1146)  amiodarone (NEXTERONE PREMIX) 360-4.14 MG/200ML-% (1.8 mg/mL) IV infusion (60 mg/hr Intravenous New Bag/Given 06/04/22 1214)  amiodarone (NEXTERONE PREMIX) 360-4.14 MG/200ML-% (1.8 mg/mL) IV infusion (has no administration in time range)  sodium chloride 0.9 % bolus 500 mL (0 mLs Intravenous Stopped 06/04/22 1024)  sodium chloride 0.9 % bolus 500 mL (500 mLs Intravenous New Bag/Given 06/04/22 1145)  amiodarone (NEXTERONE) IV bolus only 150 mg/100 mL (0 mg Intravenous Stopped 06/04/22 1202)  heparin bolus via infusion 4,500 Units (4,500 Units Intravenous Bolus from Bag 06/04/22 1146)    ED Course/ Medical Decision Making/ A&P                             Medical Decision Making Amount and/or Complexity of Data Reviewed Labs: ordered. Radiology: ordered.  Risk Prescription drug management. Decision regarding hospitalization.   Patient is a 82year old male who presents with generalized weakness 2 weeks after a viral syndrome.  His blood pressure is a little bit soft.  He was given IV fluids.  He has noted to be in A-fib with RVR.  He has a remote history one-time of A-fib after cardiac catheterization but it self resolved during his hospitalization.  No further episodes.  His rate is a little bit on the high side in the 110s and  120s.  His chest x-ray does not show any evidence of  pneumonia.  This was interpreted by me and confirmed by the radiologist.  His other labs show a markedly elevated WBC count at 60+ thousand I did speak with the on-call hematologist, or see who was not overly concerned about it given that the hemoglobin and platelet counts were okay.  Other labs are nonconcerning.  His troponins are mildly elevated but flat.  I did speak with the cardiologist on-call for Dr. Einar Gip, Dr. Shellia Carwin, who recommends starting the patient on amnio drip and heparin.  This was started.  She advises that they will see the patient once he is transferred to [facility].  I also spoke with Dr. Tamala Julian with the hospitalist service who will admit the patient for further treatment.  I did ask the patient about his conjunctivitis and his left eye.  He said that he started drops at 1 January.  His wife said that headache had gotten a lot better but she forgot to put the drops in yesterday and today it looked a little more red.  She was going to bring the drops next time she comes up to the ED so we can restart them.  1508: Patient converted back to normal sinus rhythm.  Amio drip was continued for about another hour and then discontinued.  CRITICAL CARE Performed by: Malvin Johns Total critical care time: 70 minutes Critical care time was exclusive of separately billable procedures and treating other patients. Critical care was necessary to treat or prevent imminent or life-threatening deterioration. Critical care was time spent personally by me on the following activities: development of treatment plan with patient and/or surrogate as well as nursing, discussions with consultants, evaluation of patient's response to treatment, examination of patient, obtaining history from patient or surrogate, ordering and performing treatments and interventions, ordering and review of laboratory studies, ordering and review of radiographic studies, pulse oximetry and re-evaluation of patient's  condition.   {Final Clinical Impression(s) / ED Diagnoses Final diagnoses:  Acute conjunctivitis of left eye, unspecified acute conjunctivitis type  Weakness  Atrial fibrillation with rapid ventricular response [facility])    Rx / DC Orders ED Discharge Orders     None         Malvin Johns, MD 06/04/22 1250    Malvin Johns, MD 06/04/22 1316    Malvin Johns, MD 06/04/22 [phone]

## 2022-06-04 NOTE — ED Notes (Signed)
Pt converted to sinus rhythm rate 88. EDP notified. Will continue amiodarone at this time

## 2022-06-04 NOTE — ED Notes (Signed)
RN called floor, the bed is still being cleaned.

## 2022-06-04 NOTE — ED Triage Notes (Signed)
States has been sick for 3 weeks. States was seen on 1/2 and given fluids. C/o cough and fatigue. States has has hypotension at home the past few days. Denies vomiting/diarrhea. A&Ox4

## 2022-06-04 NOTE — ED Notes (Signed)
Pt informed urine specimen needed. Urinal provided

## 2022-06-04 NOTE — Progress Notes (Signed)
ANTICOAGULATION CONSULT NOTE - Initial Consult  Pharmacy Consult for heparin Indication: atrial fibrillation  Allergies  Allergen Reactions   Bystolic [Nebivolol Hcl]    Cephalexin    Gemfibrozil    Glipizide    Januvia [Sitagliptin]    Lipitor [Atorvastatin Calcium]    Lisinopril Other (See Comments)    Pt states lisinopril causes severe constipation    Losartan    Metoprolol    Omeprazole    Simvastatin    Gabapentin     Patient Measurements: Height: '5\' 10"'$  (177.8 cm) Weight: 77.4 kg (170 lb 11.2 oz) IBW/kg (Calculated) : 73 Heparin Dosing Weight: 77.4kg  Vital Signs: Temp: 97.4 F (36.3 C) (01/15 0738) Temp Source: Oral (01/15 0738) BP: 112/57 (01/15 1100) Pulse Rate: 98 (01/15 1100)  Labs: Recent Labs    06/04/22 0807 06/04/22 1021  HGB 11.1*  --   HCT 35.0*  --   PLT 213  --   CREATININE 1.38*  --   TROPONINIHS 21* 18*    Estimated Creatinine Clearance: 44.8 mL/min (A) (by C-G formula based on SCr of 1.38 mg/dL (H)).   Medical History: Past Medical History:  Diagnosis Date   Chronic Leukemia    Diabetes mellitus without complication ([place])    Hyperlipemia 12/02/2018   Hyperlipidemia    Hypertension    Type 2 diabetes mellitus with complication, without long-term current use of insulin ([place]) 12/02/2018    Medications:  Infusions:   amiodarone     heparin     sodium chloride      Assessment: 27 yof presented to the ED with weakness. Found to be in afib. To start IV heparin. Baseline Hgb is low and platelets are WNL. He is not on anticoagulation PTA.   Goal of Therapy:  Heparin level 0.3-0.7 units/ml Monitor platelets by anticoagulation protocol: Yes   Plan:  Heparin bolus 4500 units IV x 1 Heparin gtt 1100 units/hr Check an 8 hr heparin level Daily heparin level and CBC  Keishawna Carranza, Rande Lawman 06/04/2022,11:17 AM

## 2022-06-04 NOTE — Plan of Care (Signed)
Transfer from [facility] Eric Harrington is a 82 year old male with pmh CLL who presented with complaints of weakness.  Reported to be sick with some viral illness 3 weeks ago.  No reports of n/v/d.  Blood pressures noted to be in the 70-80s yesterday, but currently maintained.  Labs significant for WBC 61.8, hemoglobin 11.8, sodium 132, BUN 29, creatinine 1.38, glucose 148, and high-sensitivity troponin 21->18.  Physical exam noted erythema of the left with watery discharge.  He was noted to be in A-fib with RVR and had a previous history of it after cardiac cath back in 2017 but not on anticoagulation.  Chest x-ray showed no acute abnormality.  Influenza, COVID-19, and RSV screening were negative.  UA did not show any signs of infection.  Case was reported to have been discussed with oncology.  Patient was started on amiodarone and heparin drip.  Accepted to a cardiac telemetry bed.  Dr. Shellia Carwin of [facility] cardiology to be notified upon patient arrival.

## 2022-06-04 NOTE — Progress Notes (Signed)
ANTICOAGULATION CONSULT NOTE  Pharmacy Consult for heparin Indication: atrial fibrillation  Allergies  Allergen Reactions   Bystolic [Nebivolol Hcl]    Cephalexin    Gemfibrozil    Glipizide    Januvia [Sitagliptin]    Lipitor [Atorvastatin Calcium]    Lisinopril Other (See Comments)    Pt states lisinopril causes severe constipation    Losartan    Metoprolol    Omeprazole    Simvastatin    Gabapentin     Patient Measurements: Height: '5\' 10"'$  (177.8 cm) Weight: 77.4 kg (170 lb 11.2 oz) IBW/kg (Calculated) : 73 Heparin Dosing Weight: 77.4kg  Vital Signs: Temp: 97.8 F (36.6 C) (01/15 1800) BP: 127/66 (01/15 2115) Pulse Rate: 78 (01/15 2115)  Labs: Recent Labs    06/04/22 0807 06/04/22 1021 06/04/22 2027  HGB 11.1*  --   --   HCT 35.0*  --   --   PLT 213  --   --   HEPARINUNFRC  --   --  <0.10*  CREATININE 1.38*  --   --   TROPONINIHS 21* 18*  --      Estimated Creatinine Clearance: 44.8 mL/min (A) (by C-G formula based on SCr of 1.38 mg/dL (H)).   Medical History: Past Medical History:  Diagnosis Date   Chronic Leukemia    Diabetes mellitus without complication ([place])    Hyperlipemia 12/02/2018   Hyperlipidemia    Hypertension    Type 2 diabetes mellitus with complication, without long-term current use of insulin ([place]) 12/02/2018    Medications:  Infusions:   heparin 1,100 Units/hr (06/04/22 1146)    Assessment: 50 yof presented to the ED with weakness. Found to be in afib. To start IV heparin. Baseline Hgb is low and platelets are WNL. He is not on anticoagulation PTA.   Initial heparin level is undetectable, no infusion issues per RN report  Goal of Therapy:  Heparin level 0.3-0.7 units/ml Monitor platelets by anticoagulation protocol: Yes   Plan:  Heparin 2000 units IV x 1, and gtt rate increase to 1300 units/hr F/u 8 hour heparin level  Bertis Ruddy, PharmD, [place] Pharmacist ED Pharmacist Phone # [phone] 06/04/2022  9:50 PM

## 2022-06-04 NOTE — ED Notes (Signed)
Assumed care of pt, found him alert and oriented w/ his wife bedside. He was just returning to bed after using the bathroom.  RN encouraged pt and wife to call for assistance as he is on heparin, both agreed.    Pt had no complaints or needs identified at this time.  Wife indicated that she was going to stay for the night.  RN provided family w/ a recliner for comfort.

## 2022-06-05 ENCOUNTER — Other Ambulatory Visit (HOSPITAL_COMMUNITY): Payer: Self-pay

## 2022-06-05 ENCOUNTER — Encounter (HOSPITAL_COMMUNITY): Payer: Self-pay | Admitting: Internal Medicine

## 2022-06-05 ENCOUNTER — Observation Stay (HOSPITAL_BASED_OUTPATIENT_CLINIC_OR_DEPARTMENT_OTHER): Payer: Medicare Other

## 2022-06-05 DIAGNOSIS — Z1152 Encounter for screening for COVID-19: Secondary | ICD-10-CM | POA: Diagnosis not present

## 2022-06-05 DIAGNOSIS — Z8616 Personal history of COVID-19: Secondary | ICD-10-CM | POA: Diagnosis not present

## 2022-06-05 DIAGNOSIS — B952 Enterococcus as the cause of diseases classified elsewhere: Secondary | ICD-10-CM | POA: Diagnosis not present

## 2022-06-05 DIAGNOSIS — I129 Hypertensive chronic kidney disease with stage 1 through stage 4 chronic kidney disease, or unspecified chronic kidney disease: Secondary | ICD-10-CM | POA: Diagnosis not present

## 2022-06-05 DIAGNOSIS — Z8249 Family history of ischemic heart disease and other diseases of the circulatory system: Secondary | ICD-10-CM | POA: Diagnosis not present

## 2022-06-05 DIAGNOSIS — E1122 Type 2 diabetes mellitus with diabetic chronic kidney disease: Secondary | ICD-10-CM | POA: Diagnosis not present

## 2022-06-05 DIAGNOSIS — Z888 Allergy status to other drugs, medicaments and biological substances status: Secondary | ICD-10-CM | POA: Diagnosis not present

## 2022-06-05 DIAGNOSIS — N1831 Chronic kidney disease, stage 3a: Secondary | ICD-10-CM | POA: Diagnosis not present

## 2022-06-05 DIAGNOSIS — Z87891 Personal history of nicotine dependence: Secondary | ICD-10-CM | POA: Diagnosis not present

## 2022-06-05 DIAGNOSIS — I4891 Unspecified atrial fibrillation: Secondary | ICD-10-CM

## 2022-06-05 DIAGNOSIS — Z881 Allergy status to other antibiotic agents status: Secondary | ICD-10-CM | POA: Diagnosis not present

## 2022-06-05 DIAGNOSIS — C9110 Chronic lymphocytic leukemia of B-cell type not having achieved remission: Secondary | ICD-10-CM | POA: Diagnosis not present

## 2022-06-05 DIAGNOSIS — Z833 Family history of diabetes mellitus: Secondary | ICD-10-CM | POA: Diagnosis not present

## 2022-06-05 DIAGNOSIS — R61 Generalized hyperhidrosis: Secondary | ICD-10-CM | POA: Diagnosis not present

## 2022-06-05 DIAGNOSIS — N390 Urinary tract infection, site not specified: Secondary | ICD-10-CM | POA: Diagnosis not present

## 2022-06-05 DIAGNOSIS — R7881 Bacteremia: Secondary | ICD-10-CM | POA: Diagnosis present

## 2022-06-05 DIAGNOSIS — I480 Paroxysmal atrial fibrillation: Secondary | ICD-10-CM | POA: Diagnosis not present

## 2022-06-05 DIAGNOSIS — Z7901 Long term (current) use of anticoagulants: Secondary | ICD-10-CM | POA: Diagnosis not present

## 2022-06-05 DIAGNOSIS — Z79899 Other long term (current) drug therapy: Secondary | ICD-10-CM | POA: Diagnosis not present

## 2022-06-05 DIAGNOSIS — Z7984 Long term (current) use of oral hypoglycemic drugs: Secondary | ICD-10-CM | POA: Diagnosis not present

## 2022-06-05 DIAGNOSIS — E785 Hyperlipidemia, unspecified: Secondary | ICD-10-CM | POA: Diagnosis not present

## 2022-06-05 DIAGNOSIS — E1142 Type 2 diabetes mellitus with diabetic polyneuropathy: Secondary | ICD-10-CM | POA: Diagnosis not present

## 2022-06-05 LAB — CBC WITH DIFFERENTIAL/PLATELET
Abs Immature Granulocytes: 0 10*3/uL (ref 0.00–0.07)
Basophils Absolute: 0 10*3/uL (ref 0.0–0.1)
Basophils Relative: 0 %
Eosinophils Absolute: 0 10*3/uL (ref 0.0–0.5)
Eosinophils Relative: 0 %
HCT: 33.2 % — ABNORMAL LOW (ref 39.0–52.0)
Hemoglobin: 10.8 g/dL — ABNORMAL LOW (ref 13.0–17.0)
Lymphocytes Relative: 94 %
Lymphs Abs: 48.9 10*3/uL — ABNORMAL HIGH (ref 0.7–4.0)
MCH: 31.6 pg (ref 26.0–34.0)
MCHC: 32.5 g/dL (ref 30.0–36.0)
MCV: 97.1 fL (ref 80.0–100.0)
Monocytes Absolute: 0 10*3/uL — ABNORMAL LOW (ref 0.1–1.0)
Monocytes Relative: 0 %
Neutro Abs: 1.6 10*3/uL — ABNORMAL LOW (ref 1.7–7.7)
Neutrophils Relative %: 3 %
Other: 3 %
Platelets: 158 10*3/uL (ref 150–400)
RBC: 3.42 MIL/uL — ABNORMAL LOW (ref 4.22–5.81)
RDW: 13.8 % (ref 11.5–15.5)
WBC Morphology: ABNORMAL
WBC: 52 10*3/uL (ref 4.0–10.5)
nRBC: 0 % (ref 0.0–0.2)

## 2022-06-05 LAB — COMPREHENSIVE METABOLIC PANEL
ALT: 13 U/L (ref 0–44)
AST: 13 U/L — ABNORMAL LOW (ref 15–41)
Albumin: 3.1 g/dL — ABNORMAL LOW (ref 3.5–5.0)
Alkaline Phosphatase: 82 U/L (ref 38–126)
Anion gap: 8 (ref 5–15)
BUN: 21 mg/dL (ref 8–23)
CO2: 25 mmol/L (ref 22–32)
Calcium: 8.7 mg/dL — ABNORMAL LOW (ref 8.9–10.3)
Chloride: 103 mmol/L (ref 98–111)
Creatinine, Ser: 1.11 mg/dL (ref 0.61–1.24)
GFR, Estimated: >60 mL/min (ref >60)
Glucose, Bld: 161 mg/dL — ABNORMAL HIGH (ref 70–99)
Potassium: 4.1 mmol/L (ref 3.5–5.1)
Sodium: 136 mmol/L (ref 135–145)
Total Bilirubin: 0.4 mg/dL (ref 0.3–1.2)
Total Protein: 5.5 g/dL — ABNORMAL LOW (ref 6.5–8.1)

## 2022-06-05 LAB — ECHOCARDIOGRAM COMPLETE
Area-P 1/2: 3.27 cm2
Calc EF: 61.5 %
Height: 70 in
S' Lateral: 3.1 cm
Single Plane A2C EF: 56.8 %
Single Plane A4C EF: 62.4 %
Weight: 2710.4 oz

## 2022-06-05 LAB — LACTIC ACID, PLASMA
Lactic Acid, Venous: 0.9 mmol/L (ref 0.5–1.9)
Lactic Acid, Venous: 0.7 mmol/L (ref 0.5–1.9)

## 2022-06-05 LAB — MAGNESIUM: Magnesium: 2 mg/dL (ref 1.7–2.4)

## 2022-06-05 LAB — PHOSPHORUS: Phosphorus: 3.1 mg/dL (ref 2.5–4.6)

## 2022-06-05 LAB — HEMOGLOBIN A1C
Hgb A1c MFr Bld: 7 % — ABNORMAL HIGH (ref 4.8–5.6)
Mean Plasma Glucose: 154.2 mg/dL

## 2022-06-05 LAB — PATHOLOGIST SMEAR REVIEW

## 2022-06-05 LAB — PROCALCITONIN: Procalcitonin: <0.1 ng/mL

## 2022-06-05 LAB — MRSA NEXT GEN BY PCR, NASAL: MRSA by PCR Next Gen: NOT DETECTED

## 2022-06-05 LAB — HEPARIN LEVEL (UNFRACTIONATED): Heparin Unfractionated: 0.66 IU/mL (ref 0.30–0.70)

## 2022-06-05 LAB — TSH: TSH: 0.789 u[IU]/mL (ref 0.350–4.500)

## 2022-06-05 MED ORDER — MELATONIN 5 MG PO TABS: 5.0000 mg | ORAL_TABLET | Freq: Every evening | ORAL | Status: DC | PRN

## 2022-06-05 MED ORDER — ACETAMINOPHEN 325 MG PO TABS: 650.0000 mg | ORAL_TABLET | Freq: Four times a day (QID) | ORAL | Status: DC | PRN

## 2022-06-05 MED ORDER — SODIUM CHLORIDE 0.9 % IV SOLN
500.0000 mg | Freq: Every day | INTRAVENOUS | Status: DC
  Administered 2022-06-05: 500 mg via INTRAVENOUS
  Filled 2022-06-05: qty 5

## 2022-06-05 MED ORDER — SODIUM CHLORIDE 3 % IN NEBU
4.0000 mL | INHALATION_SOLUTION | Freq: Two times a day (BID) | RESPIRATORY_TRACT | Status: DC
  Filled 2022-06-05 (×2): qty 4

## 2022-06-05 MED ORDER — APIXABAN 5 MG PO TABS
5.0000 mg | ORAL_TABLET | Freq: Two times a day (BID) | ORAL | Status: DC
  Administered 2022-06-05: 5 mg via ORAL
  Filled 2022-06-05: qty 1

## 2022-06-05 MED ORDER — IPRATROPIUM-ALBUTEROL 0.5-2.5 (3) MG/3ML IN SOLN: 3.0000 mL | RESPIRATORY_TRACT | Status: DC | PRN

## 2022-06-05 MED ORDER — SODIUM CHLORIDE 0.9 % IV SOLN
2.0000 g | Freq: Every day | INTRAVENOUS | Status: DC
  Administered 2022-06-05: 2 g via INTRAVENOUS
  Filled 2022-06-05: qty 20

## 2022-06-05 MED ORDER — INSULIN ASPART 100 UNIT/ML IJ SOLN
0.0000 [IU] | Freq: Three times a day (TID) | INTRAMUSCULAR | Status: DC
  Administered 2022-06-05: 5 [IU] via SUBCUTANEOUS

## 2022-06-05 MED ORDER — GUAIFENESIN ER 600 MG PO TB12
600.0000 mg | ORAL_TABLET | Freq: Two times a day (BID) | ORAL | Status: DC
  Administered 2022-06-05: 600 mg via ORAL
  Filled 2022-06-05: qty 1

## 2022-06-05 MED ORDER — PROCHLORPERAZINE EDISYLATE 10 MG/2ML IJ SOLN: 5.0000 mg | Freq: Four times a day (QID) | INTRAMUSCULAR | Status: DC | PRN

## 2022-06-05 MED ORDER — AMIODARONE HCL 200 MG PO TABS
ORAL_TABLET | ORAL | 0 refills | Status: DC
  Filled 2022-06-05: qty 58, 30d supply, fill #0

## 2022-06-05 MED ORDER — INSULIN ASPART 100 UNIT/ML IJ SOLN: 0.0000 [IU] | Freq: Every day | INTRAMUSCULAR | Status: DC

## 2022-06-05 MED ORDER — IPRATROPIUM-ALBUTEROL 0.5-2.5 (3) MG/3ML IN SOLN
3.0000 mL | Freq: Four times a day (QID) | RESPIRATORY_TRACT | Status: DC
  Administered 2022-06-05: 3 mL via RESPIRATORY_TRACT
  Filled 2022-06-05 (×2): qty 3

## 2022-06-05 MED ORDER — APIXABAN 5 MG PO TABS
5.0000 mg | ORAL_TABLET | Freq: Two times a day (BID) | ORAL | 0 refills | Status: DC
  Filled 2022-06-05: qty 60, 30d supply, fill #0

## 2022-06-05 NOTE — Progress Notes (Signed)
  Echocardiogram 2D Echocardiogram has been performed.  Eric Harrington 06/05/2022, 9:31 AM

## 2022-06-05 NOTE — Evaluation (Signed)
Physical Therapy Evaluation Patient Details Name: Eric Harrington MRN: [id number] DOB: 11-02-1940 Today's Date: 06/05/2022  History of Present Illness  82 y.o. male presented to [facility] ED as a direct admit from [facility] ED due to generalized fatigue, persistent 2 weeks cough, hypotension resulting in syncopal episode and resultant fall at home, Found to be in  A-fib with RVR  severe leukocytosis with WBC of greater than 60,000 in the setting of CLL. Started on amiodarone drip and heparin drip and admitted for observation.  PMH: CLL, followed by medical oncology Dr. Irene Limbo, hyperlipidemia, type 2 diabetes, diabetic polyneuropathy, history of PACs, transient A-fib with cardiac cath, CKD 3B, irritable bowel syndrome, GERD, history of presyncope,  Clinical Impression  Patient evaluated by Physical Therapy with no further acute PT needs identified. All education has been completed and the patient and wife have no further questions. Pt is mod I to supervision level for all mobility assessed. Pt educated on graduated return to activity at discharged. Recommended to Mobility Specialist to maintain strength and endurance. Pt has no further Physical Therapy or equipment needs. PT is signing off. Thank you for this referral.        Recommendations for follow up therapy are one component of a multi-disciplinary discharge planning process, led by the attending physician.  Recommendations may be updated based on patient status, additional functional criteria and insurance authorization.  Follow Up Recommendations No PT follow up      Assistance Recommended at Discharge PRN  Patient can return home with the following  A little help with walking and/or transfers;A little help with bathing/dressing/bathroom;Assist for transportation    Equipment Recommendations None recommended by PT     Functional Status Assessment Patient has had a recent decline in their functional status and demonstrates the ability to make  significant improvements in function in a reasonable and predictable amount of time.     Precautions / Restrictions Precautions Precautions: Fall Precaution Comments: has had falls related to orthostatic hypotension Restrictions Weight Bearing Restrictions: No      Mobility  Bed Mobility Overal bed mobility: Modified Independent             General bed mobility comments: HoB elevated and use of bed rails, no physical assist provided    Transfers Overall transfer level: Needs assistance Equipment used: None Transfers: Sit to/from Stand Sit to Stand: Supervision           General transfer comment: supervision for safety from bed and with use of grab bar from low toilet in the bathroom    Ambulation/Gait Ambulation/Gait assistance: Supervision Gait Distance (Feet): 12 Feet (x2) Assistive device: None Gait Pattern/deviations: Step-through pattern, WFL(Within Functional Limits), Shuffle Gait velocity: WFL Gait velocity interpretation: <1.8 ft/sec, indicate of risk for recurrent falls (limited by short distance and navigation with IV pole)   General Gait Details: supervision for safety and management of lines and leads, overall steady gait to get to and from bathroom, ECHO waiting in hall so longer distance ambulation not attempted        Balance Overall balance assessment: Modified Independent                                           Pertinent Vitals/Pain Pain Assessment Pain Assessment: No/denies pain    Home Living Family/patient expects to be discharged to:: Private residence Living Arrangements: Spouse/significant other Available Help at  Discharge: Available 24 hours/day Type of Home: House Home Access: Level entry       Home Layout: One level Home Equipment: None      Prior Function Prior Level of Function : Independent/Modified Independent             Mobility Comments: ambulates community distances without AD,  drives ADLs Comments: wife manages meds     Hand Dominance        Extremity/Trunk Assessment   Upper Extremity Assessment Upper Extremity Assessment: Overall WFL for tasks assessed    Lower Extremity Assessment Lower Extremity Assessment: Overall WFL for tasks assessed    Cervical / Trunk Assessment Cervical / Trunk Assessment: Normal  Communication   Communication: HOH (wears hearing aides)  Cognition Arousal/Alertness: Awake/alert Behavior During Therapy: WFL for tasks assessed/performed Overall Cognitive Status: Within Functional Limits for tasks assessed                                          General Comments General comments (skin integrity, edema, etc.): HR 69 at rest, max noted with ambulation 82 bpm, BP after ambulation 160/77, SpO2 on RA 97%O2        Assessment/Plan    PT Assessment Patient does not need any further PT services   AM-PAC PT "6 Clicks" Mobility  Outcome Measure Help needed turning from your back to your side while in a flat bed without using bedrails?: None Help needed moving from lying on your back to sitting on the side of a flat bed without using bedrails?: None Help needed moving to and from a bed to a chair (including a wheelchair)?: None Help needed standing up from a chair using your arms (e.g., wheelchair or bedside chair)?: None Help needed to walk in hospital room?: None Help needed climbing 3-5 steps with a railing? : A Little 6 Click Score: 23    End of Session Equipment Utilized During Treatment: Gait belt Activity Tolerance: Patient tolerated treatment well Patient left: in bed;with call bell/phone within reach;with family/visitor present;Other (comment) (ECHO tech setting up) Nurse Communication: Mobility status PT Visit Diagnosis: Muscle weakness (generalized) (M62.81)    Time: 8315-1761 PT Time Calculation (min) (ACUTE ONLY): 21 min   Charges:   PT Evaluation $PT Eval Moderate Complexity: 1 Mod           Sonja Manseau B. Migdalia Dk PT, DPT Acute Rehabilitation Services Please use secure chat or  Call Office [phone]   [place] 06/05/2022, 9:25 AM

## 2022-06-05 NOTE — Progress Notes (Signed)
Critical WBC reported. MD notified.Pt on IV abx. No new orders at this time.

## 2022-06-05 NOTE — Discharge Summary (Signed)
Physician Discharge Summary   Patient: Eric Harrington MRN: [id number] DOB: 14-Jun-1940  Admit date:     06/04/2022  Discharge date: 06/05/22  Discharge Physician: Patrecia Pour   PCP: [name], MD   Recommendations at discharge:  Follow up with PCP in 1-2 weeks with recheck of respiratory status, consider formal PFTs and monitoring CBC (routine heme/onc follow up for CLL also recommended).  Follow up with cardiology, [facility], as arranged by their office.   Discharge Diagnoses: Principal Problem:   Atrial fibrillation [facility])  Hospital Course: H&P copied below from admission this morning. Summary of hospital course is that the patient converted to sinus rhythm prior to transfer to [facility], and has remained in sinus rhythm with improvement in all symptoms since that time. Echo showed preserved LVSF and no regional wall motion abnormality. Cardiology, Dr. Shellia Carwin, recommends discharge on oral amiodarone load and eliquis, will have office call the patient to arrange follow up.   Chief Complaint: Generalized fatigue, persistent 2 weeks cough   HPI: Eric Harrington is a 82 y.o. male with medical history significant for CLL, followed by medical oncology Dr. Irene Limbo, hyperlipidemia, type 2 diabetes, diabetic polyneuropathy, history of PACs, transient A-fib with cardiac cath, CKD 3B, irritable bowel syndrome, GERD, history of presyncope, presented to [facility] ED as a direct admit from [facility] ED due to generalized fatigue, persistent 2 weeks cough, hypotension at home, and A-fib with RVR.  Endorses 2 weeks duration of a semiproductive cough.  Night sweats.  Denies any GI symptoms.   In the ED, noted to be in A-fib with RVR, severe leukocytosis with WBC of greater than 60,000 in the setting of CLL.  EDP discussed the case with hematologist on-call who was not overly concerned about it given that his hemoglobin and platelet counts were okay.  EDP also discussed the case with cardiologist on-call  Dr. Shellia Carwin who recommended starting the patient on amiodarone drip and heparin drip.  The patient was admitted by Dr. Tamala Julian, [facility], hospitalist service and transferred to [facility] telemetry cardiac unit.   ED Course: Tmax 98.4.  BP 142/78, pulse 73, respiration rate 16, O2 saturation 100% on room air.  Lab studies remarkable for WBC 61.8 thousand, hemoglobin 11.1, platelet count 213.  Sodium 132, glucose 148, BUN 29, creatinine 1.38, GFR 52, troponin 21, 18.  Paroxysmal atrial fibrillation with RVR Follow TSH Started on amiodarone drip and heparin drip in the ED CHA2DS2-VASc score of 5 Continue heparin drip for primary CVA prevention.  Switch to DOAC, Eliquis, when okay with cardiology. Converted back to sinus rhythm.  Obtain twelve-lead EKG. Follow 2D echo Cardiology consulted   CLL, followed by hematology/oncology, Dr. Irene Limbo WBC 61,000 Hemoglobin and platelet count stable. EDP discussed the case with hematology oncology, not overly concerned   Cough possible reactive airway disease, rule out active infective process. Presumptive community-acquired pneumonia, POA No evidence of PNA on CXR.  No CT scan images in place to definitely rule out pneumonia.  Treated with azithromycin and prednisone outpatient.  Completed azithromycin about a week ago.  Prednisone completed yesterday. Follow procalcitonin, lactic acid level Follow MRSA screening test. Cover with Rocephin and IV azithromycin until active infective process is ruled out. Covid-19, influenza A&B, RSV, all returned negative. Duonebs q6H Mucinex 600 mg twice daily, hypersaline nebs twice daily x 3 days   Elevated troponin High-sensitivity troponin 21, 18. Follow 2D echo Cardiology consulted   Generalized fatigue, suspect multifactorial secondary to above PT OT assessment  Mobilize as tolerated with assistance and fall precautions   Type 2 diabetes with hyperglycemia Hold off home oral hypoglycemics Obtain  hemoglobin A1c Start insulin sliding scale.   GERD Resume home esomeprazole   Hyperlipidemia Resume home Crestor  Consultants: Cardiology Procedures performed: Echocardiogram  Disposition: Home Diet recommendation:  Cardiac and Carb modified diet DISCHARGE MEDICATION: Allergies as of 06/05/2022       Reactions   Amlodipine Besylate Other (See Comments), Rash   Bystolic [nebivolol Hcl]    Cephalexin    Doxycycline    Fever/chills   Gemfibrozil    Glipizide    Januvia [sitagliptin]    Lipitor [atorvastatin Calcium]    Lisinopril Other (See Comments)   Pt states lisinopril causes severe constipation   Losartan    Metoprolol Other (See Comments)   weakness   Omeprazole    Simvastatin    Gabapentin         Medication List     STOP taking these medications    aspirin EC 81 MG tablet       TAKE these medications    albuterol 108 (90 Base) MCG/ACT inhaler Commonly known as: VENTOLIN HFA Inhale 1 puff into the lungs every 4 (four) hours as needed for wheezing or shortness of breath.   amiodarone 200 MG tablet Commonly known as: Pacerone Take 2 tablets (400 mg total) by mouth 2 (two) times daily for 7 days, THEN 1 tablet (200 mg total) 2 (two) times daily for 7 days, THEN 1 tablet (200 mg total) daily for 16 days. Start taking on: June 05, 2022   apixaban 5 MG Tabs tablet Commonly known as: ELIQUIS Take 1 tablet (5 mg total) by mouth 2 (two) times daily.   carboxymethylcellulose 0.5 % Soln Commonly known as: REFRESH PLUS Place 1 drop into both eyes daily as needed (dry eyes).   cetirizine 10 MG tablet Commonly known as: ZYRTEC Take 10 mg by mouth daily as needed for allergies.   cyanocobalamin 1000 MCG tablet Commonly known as: VITAMIN B12 Take 1,000 mcg by mouth daily.   docusate sodium 100 MG capsule Commonly known as: COLACE Take 100 mg by mouth daily.   esomeprazole 20 MG capsule Commonly known as: NEXIUM Take 20 mg by mouth daily at 12  noon.   Farxiga 10 MG Tabs tablet Generic drug: dapagliflozin propanediol Take 10 mg by mouth daily.   fluticasone 50 MCG/ACT nasal spray Commonly known as: FLONASE Place 1 spray into both nostrils daily as needed for allergies.   glimepiride 2 MG tablet Commonly known as: AMARYL Take 2 mg by mouth daily with breakfast.   guaiFENesin-codeine 100-10 MG/5ML syrup Take 10 mLs by mouth every 4 (four) hours as needed for cough.   IGLUCOSE TEST STRIPS VI by In Vitro route.   Lancets Misc by Does not apply route.   metFORMIN 1000 MG tablet Commonly known as: GLUCOPHAGE Take 500 mg by mouth 2 (two) times daily.   rosuvastatin 5 MG tablet Commonly known as: CRESTOR Take 5 mg by mouth daily.   tobramycin 0.3 % ophthalmic solution Commonly known as: TOBREX Place 1 drop into both eyes every 4 (four) hours.   vitamin C 1000 MG tablet Take 1,000 mg by mouth daily.   VITAMIN D PO Take 1,000 Units by mouth daily.   VITAMIN-B COMPLEX PO Take 1 tablet by mouth daily.        Follow-up Information     [name], MD. Schedule an appointment as soon as  possible for a visit.   Specialty: Family Medicine Contact information: [place] [address] [phone]         Adrian Prows, MD. Call.   Specialty: Cardiology Contact information: [place] [address] [phone]                Discharge Exam: Danley Danker Weights   06/04/22 0734 06/05/22 0150  Weight: 77.4 kg 76.8 kg  BP (!) 138/91 (BP Location: Left Arm)   Pulse 71   Temp 97.7 F (36.5 C) (Oral)   Resp 18   Ht '5\' 10"'$  (1.778 m)   Wt 76.8 kg   SpO2 98%   BMI 24.31 kg/m   RRR, NSR on monitor, no MRG or edema Clear, nonlabored Pleasant, no distress  Condition at discharge: stable  The results of significant diagnostics from this hospitalization (including imaging, microbiology, ancillary and laboratory) are listed below for reference.   Imaging  Studies: ECHOCARDIOGRAM COMPLETE  Result Date: 06/05/2022    ECHOCARDIOGRAM REPORT   Patient Name:   ONUR MORI Date of Exam: 06/05/2022 Medical Rec #:  [id number]     Height:       70.0 in Accession #:    [id number]    Weight:       169.4 lb Date of Birth:  Jan 26, 1941     BSA:          1.945 m Patient Age:    82 years      BP:           160/77 mmHg Patient Gender: M             HR:           72 bpm. Exam Location:  Inpatient Procedure: 2D Echo, Cardiac Doppler and Color Doppler Indications:    I48.91* Unspeicified atrial fibrillation  History:        Patient has no prior history of Echocardiogram examinations.                 Risk Factors:Diabetes, Dyslipidemia and Hypertension.  Sonographer:    Phineas Douglas Referring Phys: [id number] [place]  1. Left ventricular ejection fraction, by estimation, is 60 to 65%. The left ventricle has normal function. The left ventricle has no regional wall motion abnormalities. Left ventricular diastolic parameters are indeterminate.  2. Right ventricular systolic function is normal. The right ventricular size is normal. There is normal pulmonary artery systolic pressure. The estimated right ventricular systolic pressure is 44.0 mmHg.  3. The mitral valve is grossly normal. Trivial mitral valve regurgitation. No evidence of mitral stenosis.  4. The aortic valve is tricuspid. Aortic valve regurgitation is trivial. Aortic valve sclerosis is present, with no evidence of aortic valve stenosis.  5. Aortic dilatation noted. There is borderline dilatation of the aortic root, measuring 38 mm.  6. The inferior vena cava is dilated in size with >50% respiratory variability, suggesting right atrial pressure of 8 mmHg. Comparison(s): No prior Echocardiogram. FINDINGS  Left Ventricle: Left ventricular ejection fraction, by estimation, is 60 to 65%. The left ventricle has normal function. The left ventricle has no regional wall motion abnormalities. The left ventricular  internal cavity size was normal in size. There is  no left ventricular hypertrophy. Left ventricular diastolic parameters are indeterminate. Right Ventricle: The right ventricular size is normal. No increase in right ventricular wall thickness. Right ventricular systolic function is normal. There is normal pulmonary artery systolic pressure.  The tricuspid regurgitant velocity is 2.53 m/s, and  with an assumed right atrial pressure of 8 mmHg, the estimated right ventricular systolic pressure is 67.6 mmHg. Left Atrium: Left atrial size was normal in size. Right Atrium: Right atrial size was normal in size. Pericardium: There is no evidence of pericardial effusion. Mitral Valve: The mitral valve is grossly normal. There is mild thickening of the mitral valve leaflet(s). There is mild calcification of the mitral valve leaflet(s). Trivial mitral valve regurgitation. No evidence of mitral valve stenosis. Tricuspid Valve: The tricuspid valve is normal in structure. Tricuspid valve regurgitation is trivial. Aortic Valve: The aortic valve is tricuspid. Aortic valve regurgitation is trivial. Aortic valve sclerosis is present, with no evidence of aortic valve stenosis. Pulmonic Valve: The pulmonic valve was not well visualized. Pulmonic valve regurgitation is trivial. Aorta: Aortic dilatation noted. There is borderline dilatation of the aortic root, measuring 38 mm. Venous: The inferior vena cava is dilated in size with greater than 50% respiratory variability, suggesting right atrial pressure of 8 mmHg. IAS/Shunts: The atrial septum is grossly normal.  LEFT VENTRICLE PLAX 2D LVIDd:         4.70 cm      Diastology LVIDs:         3.10 cm      LV e' medial:    5.55 cm/s LV PW:         1.30 cm      LV E/e' medial:  15.9 LV IVS:        1.00 cm      LV e' lateral:   7.07 cm/s LVOT diam:     2.20 cm      LV E/e' lateral: 12.4 LV SV:         84 LV SV Index:   43 LVOT Area:     3.80 cm  LV Volumes (MOD) LV vol d, MOD A2C: 111.0 ml LV  vol d, MOD A4C: 103.0 ml LV vol s, MOD A2C: 48.0 ml LV vol s, MOD A4C: 38.7 ml LV SV MOD A2C:     63.0 ml LV SV MOD A4C:     103.0 ml LV SV MOD BP:      68.6 ml RIGHT VENTRICLE             IVC RV Basal diam:  3.50 cm     IVC diam: 2.10 cm RV S prime:     17.00 cm/s TAPSE (M-mode): 2.1 cm LEFT ATRIUM             Index        RIGHT ATRIUM           Index LA diam:        3.60 cm 1.85 cm/m   RA Area:     13.90 cm LA Vol (A2C):   53.9 ml 27.71 ml/m  RA Volume:   27.20 ml  13.98 ml/m LA Vol (A4C):   47.2 ml 24.27 ml/m LA Biplane Vol: 53.2 ml 27.35 ml/m  AORTIC VALVE LVOT Vmax:   95.40 cm/s LVOT Vmean:  57.600 cm/s LVOT VTI:    0.222 m  AORTA Ao Root diam: 3.80 cm Ao Asc diam:  3.20 cm MITRAL VALVE               TRICUSPID VALVE MV Area (PHT): 3.27 cm    TR Peak grad:   25.6 mmHg MV Decel Time: 232 msec    TR Vmax:  253.00 cm/s MV E velocity: 88.00 cm/s MV A velocity: 79.10 cm/s  SHUNTS MV E/A ratio:  1.11        Systemic VTI:  0.22 m                            Systemic Diam: 2.20 cm Gwyndolyn Kaufman MD Electronically signed by Gwyndolyn Kaufman MD Signature Date/Time: 06/05/2022/11:56:43 AM    Final    DG Chest Portable 1 View  Result Date: 06/04/2022 CLINICAL DATA:  82 year old male with cough for 3 weeks.  Fatigue. EXAM: PORTABLE CHEST 1 VIEW COMPARISON:  Chest radiographs 05/22/2022 and earlier. FINDINGS: Portable AP semi upright view at 0730 hours. Mildly rotated to the right. Lung volumes and mediastinal contours are stable, within normal limits. Allowing for portable technique the lungs are clear. No pneumothorax or pleural effusion. No osseous abnormality identified. IMPRESSION: Negative portable chest. Electronically Signed   By: Genevie Ann M.D.   On: 06/04/2022 08:00   DG Chest 2 View  Result Date: 05/22/2022 CLINICAL DATA:  Cough.  Fever. EXAM: CHEST - 2 VIEW COMPARISON:  CXR 03/06/21 FINDINGS: No pleural effusion. No pneumothorax. Unchanged cardiac and mediastinal contours. No focal airspace  opacity. No displaced rib fractures. Visualized upper abdomen is unremarkable. IMPRESSION: No focal airspace opacity. Electronically Signed   By: Marin Roberts M.D.   On: 05/22/2022 12:45    Microbiology: Results for orders placed or performed during the hospital encounter of 06/04/22  Resp panel by RT-PCR (RSV, Flu A&B, Covid) Anterior Nasal Swab     Status: None   Collection Time: 06/04/22  8:58 AM   Specimen: Anterior Nasal Swab  Result Value Ref Range Status   SARS Coronavirus 2 by RT PCR NEGATIVE NEGATIVE Final    Comment: (NOTE) SARS-CoV-2 target nucleic acids are NOT DETECTED.  The SARS-CoV-2 RNA is generally detectable in upper respiratory specimens during the acute phase of infection. The lowest concentration of SARS-CoV-2 viral copies this assay can detect is 138 copies/mL. A negative result does not preclude SARS-Cov-2 infection and should not be used as the sole basis for treatment or other patient management decisions. A negative result may occur with  improper specimen collection/handling, submission of specimen other than nasopharyngeal swab, presence of viral mutation(s) within the areas targeted by this assay, and inadequate number of viral copies(<138 copies/mL). A negative result must be combined with clinical observations, patient history, and epidemiological information. The expected result is Negative.  Fact Sheet for Patients:  EntrepreneurPulse.com.au  Fact Sheet for Healthcare Providers:  IncredibleEmployment.be  This test is no t yet approved or cleared by the [place] FDA and  has been authorized for detection and/or diagnosis of SARS-CoV-2 by FDA under an Emergency Use Authorization (EUA). This EUA will remain  in effect (meaning this test can be used) for the duration of the COVID-19 declaration under Section 564(b)(1) of the Act, 21 U.S.C.section 360bbb-3(b)(1), unless the authorization is terminated  or  revoked sooner.       Influenza A by PCR NEGATIVE NEGATIVE Final   Influenza B by PCR NEGATIVE NEGATIVE Final    Comment: (NOTE) The Xpert Xpress SARS-CoV-2/FLU/RSV plus assay is intended as an aid in the diagnosis of influenza from Nasopharyngeal swab specimens and should not be used as a sole basis for treatment. Nasal washings and aspirates are unacceptable for Xpert Xpress SARS-CoV-2/FLU/RSV testing.  Fact Sheet for Patients: EntrepreneurPulse.com.au  Fact Sheet for Healthcare Providers: IncredibleEmployment.be  This test is not yet approved or cleared by the [place] and has been authorized for detection and/or diagnosis of SARS-CoV-2 by FDA under an Emergency Use Authorization (EUA). This EUA will remain in effect (meaning this test can be used) for the duration of the COVID-19 declaration under Section 564(b)(1) of the Act, 21 U.S.C. section 360bbb-3(b)(1), unless the authorization is terminated or revoked.     Resp Syncytial Virus by PCR NEGATIVE NEGATIVE Final    Comment: (NOTE) Fact Sheet for Patients: EntrepreneurPulse.com.au  Fact Sheet for Healthcare Providers: IncredibleEmployment.be  This test is not yet approved or cleared by the [place] FDA and has been authorized for detection and/or diagnosis of SARS-CoV-2 by FDA under an Emergency Use Authorization (EUA). This EUA will remain in effect (meaning this test can be used) for the duration of the COVID-19 declaration under Section 564(b)(1) of the Act, 21 U.S.C. section 360bbb-3(b)(1), unless the authorization is terminated or revoked.  Performed at [facility], [address] [place], [place] [address]   MRSA Next Gen by PCR, Nasal     Status: None   Collection Time: 06/05/22  3:06 AM   Specimen: Nasal Mucosa; Nasal Swab  Result Value Ref Range Status   MRSA by PCR Next Gen NOT DETECTED NOT DETECTED Final     Comment: (NOTE) The GeneXpert MRSA Assay (FDA approved for NASAL specimens only), is one component of a comprehensive MRSA colonization surveillance program. It is not intended to diagnose MRSA infection nor to guide or monitor treatment for MRSA infections. Test performance is not FDA approved in patients less than 45 years old. Performed at [facility], [place] [address], [place], [place] [address]   Culture, blood (Routine X 2) w Reflex to ID Panel     Status: None (Preliminary result)   Collection Time: 06/05/22  5:17 AM   Specimen: BLOOD LEFT ARM  Result Value Ref Range Status   Specimen Description BLOOD LEFT ARM  Final   Special Requests   Final    BOTTLES DRAWN AEROBIC AND ANAEROBIC Blood Culture results may not be optimal due to an excessive volume of blood received in culture bottles   Culture   Final    NO GROWTH < 12 HOURS Performed at [facility], [place] [address], [place], [place] [address]    Report Status PENDING  Incomplete  Culture, blood (Routine X 2) w Reflex to ID Panel     Status: None (Preliminary result)   Collection Time: 06/05/22  5:18 AM   Specimen: BLOOD  Result Value Ref Range Status   Specimen Description BLOOD BLOOD LEFT HAND  Final   Special Requests   Final    BOTTLES DRAWN AEROBIC AND ANAEROBIC Blood Culture adequate volume   Culture   Final    NO GROWTH < 12 HOURS Performed at [facility], [place] [address] [place], [place] [address]    Report Status PENDING  Incomplete    Labs: CBC: Recent Labs  Lab 06/04/22 0807 06/05/22 0515  WBC 61.8* 52.0*  NEUTROABS  --  1.6*  HGB 11.1* 10.8*  HCT 35.0* 33.2*  MCV 97.8 97.1  PLT 213 109   Basic Metabolic Panel: Recent Labs  Lab 06/04/22 0807 06/05/22 0515  NA 132* 136  K 3.9 4.1  CL 97* 103  CO2 26 25  GLUCOSE 148* 161*  BUN 29* 21  CREATININE 1.38* 1.11  CALCIUM 8.3* 8.7*  MG  --  2.0  PHOS  --  3.1   Liver Function Tests: Recent Labs  Lab 06/05/22 0515   AST 13*  ALT 13  ALKPHOS 82  BILITOT 0.4  PROT 5.5*  ALBUMIN 3.1*   CBG: Recent Labs  Lab 06/04/22 0810  GLUCAP 152*    Discharge time spent: greater than 30 minutes.  Signed: Patrecia Pour, MD [facility] 06/05/2022

## 2022-06-05 NOTE — Discharge Instructions (Signed)

## 2022-06-05 NOTE — TOC Benefit Eligibility Note (Signed)
Patient Teacher, English as a foreign language completed.    The patient is currently admitted and upon discharge could be taking Eliquis 5 mg.  The current 30 day co-pay is $40.00.   The patient is currently admitted and upon discharge could be taking Xarelto 20 mg.  The current 30 day co-pay is $40.00.   The patient is insured through [place], [place] Patient Advocate Specialist [place] Patient Advocate Team Direct Number: [phone]  Fax: [phone]

## 2022-06-05 NOTE — Progress Notes (Signed)
Mobility Specialist - Progress Note   06/05/22 1539  Mobility  Activity Ambulated with assistance in hallway  Level of Assistance Standby assist, set-up cues, supervision of patient - no hands on  Assistive Device None  Distance Ambulated (ft) 180 ft  Activity Response Tolerated well  Mobility Referral Yes  $Mobility charge 1 Mobility   Pt received in bed and agreeable to ambulation. No complaints throughout. Pt was returned to chair with all needs met.   Franki Monte  Mobility Specialist Please contact via Solicitor or Rehab office at [phone]

## 2022-06-05 NOTE — Consult Note (Signed)
CARDIOLOGY CONSULT NOTE  Patient ID: BREYLON SHERROW MRN: [id number] DOB/AGE: 80-Aug-1942 80 y.o.  Admit date: 06/04/2022 Referring Physician  Dr Bonner Puna Primary Physician:  [name], MD Reason for Consultation  Afib RVR  Patient ID: ROBERTT BUDA, male    DOB: 1940-06-14, 82 y.o.   MRN: [id number]  Chief Complaint  Patient presents with   Weakness   HPI:    JUSTN QUALE  is a 82 y.o. male with medical history significant for CLL, followed by medical oncology Dr. Irene Limbo, transient A-fib post cardiac cath, and diabetes who presented to [facility] ED as a direct admit from [facility] ED due to A-fib with RVR. Cardiology was placed on consult for such. Patient as started on amio and heparin gtt while at [facility] ED and converted to NSR by the time he got to [facility]. This morning, patient is feeling well and would really like to go home. His heart rate has been controlled since he arrived here. He denies chest pain, shortness of breath, palpitations, diaphoresis, syncope, orthopnea, edema, PND, claudication.   Past Medical History:  Diagnosis Date   Chronic Leukemia    Diabetes mellitus without complication ([place])    Hyperlipemia 12/02/2018   Hyperlipidemia    Hypertension    Type 2 diabetes mellitus with complication, without long-term current use of insulin ([place]) 12/02/2018   Past Surgical History:  Procedure Laterality Date   CARDIAC CATHETERIZATION  2017   Social History   Tobacco Use   Smoking status: Former    Packs/day: 1.00    Types: Cigarettes    Quit date: 1980    Years since quitting: 44.0   Smokeless tobacco: Never   Tobacco comments:    started at age 76  Substance Use Topics   Alcohol use: Never    Family History  Problem Relation Age of Onset   Lung cancer Mother    Heart attack Father    Dementia Sister    Diabetes Brother    Diabetes Sister    Heart attack Brother     Marital Status: Married  ROS  Review of Systems  Cardiovascular:  Positive for palpitations.    Objective      06/05/2022    8:14 AM 06/05/2022    1:50 AM 06/05/2022   12:55 AM  Vitals with BMI  Height  '5\' 10"'$    Weight  169 lbs 6 oz   BMI  45.80   Systolic 998  338  Diastolic 91  78  Pulse 71  74    Blood pressure (!) 138/91, pulse 71, temperature 97.7 F (36.5 C), temperature source Oral, resp. rate 18, height '5\' 10"'$  (1.778 m), weight 76.8 kg, SpO2 98 %.    Physical Exam Vitals reviewed.  HENT:     Head: Normocephalic and atraumatic.  Cardiovascular:     Rate and Rhythm: Normal rate and regular rhythm.     Pulses: Normal pulses.     Heart sounds: Normal heart sounds. No murmur heard. Pulmonary:     Effort: Pulmonary effort is normal.     Breath sounds: Normal breath sounds.  Abdominal:     General: Bowel sounds are normal.  Musculoskeletal:     Right lower leg: No edema.     Left lower leg: No edema.  Skin:    General: Skin is warm and dry.  Neurological:     Mental Status: He is alert.    Laboratory examination:   Recent Labs    05/22/22  1458 06/04/22 0807 06/05/22 0515  NA 133* 132* 136  K 3.8 3.9 4.1  CL 98 97* 103  CO2 '24 26 25  '$ GLUCOSE 119* 148* 161*  BUN 24* 29* 21  CREATININE 1.42* 1.38* 1.11  CALCIUM 8.8* 8.3* 8.7*  GFRNONAA 50* 52* >60   estimated creatinine clearance is 55.7 mL/min (by C-G formula based on SCr of 1.11 mg/dL).     Latest Ref Rng & Units 06/05/2022    5:15 AM 06/04/2022    8:07 AM 05/22/2022    2:58 PM  CMP  Glucose 70 - 99 mg/dL 161  148  119   BUN 8 - 23 mg/dL '21  29  24   '$ Creatinine 0.61 - 1.24 mg/dL 1.11  1.38  1.42   Sodium 135 - 145 mmol/L 136  132  133   Potassium 3.5 - 5.1 mmol/L 4.1  3.9  3.8   Chloride 98 - 111 mmol/L 103  97  98   CO2 22 - 32 mmol/L '25  26  24   '$ Calcium 8.9 - 10.3 mg/dL 8.7  8.3  8.8   Total Protein 6.5 - 8.1 g/dL 5.5   6.8   Total Bilirubin 0.3 - 1.2 mg/dL 0.4   0.6   Alkaline Phos 38 - 126 U/L 82   75   AST 15 - 41 U/L 13   12   ALT 0 - 44 U/L 13   8       Latest Ref Rng & Units  06/05/2022    5:15 AM 06/04/2022    8:07 AM 05/22/2022    2:58 PM  CBC  WBC 4.0 - 10.5 K/uL 52.0  61.8  C 45.8   Hemoglobin 13.0 - 17.0 g/dL 10.8  11.1  10.7   Hematocrit 39.0 - 52.0 % 33.2  35.0  32.7   Platelets 150 - 400 K/uL 158  213  114     C Corrected result   Lipid Panel No results for input(s): "CHOL", "TRIG", "LDLCALC", "VLDL", "HDL", "CHOLHDL", "LDLDIRECT" in the last 8760 hours.  HEMOGLOBIN A1C Lab Results  Component Value Date   HGBA1C 7.0 (H) 06/05/2022   MPG 154.2 06/05/2022   TSH Recent Labs    06/05/22 0515  TSH 0.789   BNP (last 3 results) No results for input(s): "BNP" in the last 8760 hours. Cardiac Panel (last 3 results) Recent Labs    06/04/22 0807 06/04/22 1021  TROPONINIHS 21* 18*     Medications and allergies   Allergies  Allergen Reactions   Amlodipine Besylate Other (See Comments) and Rash   Bystolic [Nebivolol Hcl]    Cephalexin    Doxycycline     Fever/chills   Gemfibrozil    Glipizide    Januvia [Sitagliptin]    Lipitor [Atorvastatin Calcium]    Lisinopril Other (See Comments)    Pt states lisinopril causes severe constipation    Losartan    Metoprolol Other (See Comments)    weakness   Omeprazole    Simvastatin    Gabapentin      Current Meds  Medication Sig   albuterol (VENTOLIN HFA) 108 (90 Base) MCG/ACT inhaler Inhale 1 puff into the lungs every 4 (four) hours as needed for wheezing or shortness of breath.   Ascorbic Acid (VITAMIN C) 1000 MG tablet Take 1,000 mg by mouth daily.   aspirin EC 81 MG tablet Take 81 mg by mouth daily.   B Complex Vitamins (VITAMIN-B COMPLEX PO) Take 1 tablet  by mouth daily.   carboxymethylcellulose (REFRESH PLUS) 0.5 % SOLN Place 1 drop into both eyes daily as needed (dry eyes).   cetirizine (ZYRTEC) 10 MG tablet Take 10 mg by mouth daily as needed for allergies.   cyanocobalamin (VITAMIN B12) 1000 MCG tablet Take 1,000 mcg by mouth daily.   docusate sodium (COLACE) 100 MG capsule Take 100  mg by mouth daily.    esomeprazole (NEXIUM) 20 MG capsule Take 20 mg by mouth daily at 12 noon.   FARXIGA 10 MG TABS tablet Take 10 mg by mouth daily.   fluticasone (FLONASE) 50 MCG/ACT nasal spray Place 1 spray into both nostrils daily as needed for allergies.   glimepiride (AMARYL) 2 MG tablet Take 2 mg by mouth daily with breakfast.   guaiFENesin-codeine 100-10 MG/5ML syrup Take 10 mLs by mouth every 4 (four) hours as needed for cough.   metFORMIN (GLUCOPHAGE) 1000 MG tablet Take 500 mg by mouth 2 (two) times daily.    rosuvastatin (CRESTOR) 5 MG tablet Take 5 mg by mouth daily.   tobramycin (TOBREX) 0.3 % ophthalmic solution Place 1 drop into both eyes every 4 (four) hours.   VITAMIN D PO Take 1,000 Units by mouth daily.    Scheduled Meds:  guaiFENesin  600 mg Oral BID   insulin aspart  0-5 Units Subcutaneous QHS   insulin aspart  0-9 Units Subcutaneous TID WC   ipratropium-albuterol  3 mL Nebulization Q6H   sodium chloride HYPERTONIC  4 mL Nebulization BID   Continuous Infusions:  heparin 1,300 Units/hr (06/05/22 0438)   PRN Meds:.acetaminophen, melatonin, prochlorperazine   I/O last 3 completed shifts: In: 1224.7 [I.V.:192.3; IV Piggyback:1032.4] Out: 400 [Urine:400] No intake/output data recorded.  Net IO Since Admission: 824.66 mL [06/05/22 1156]   Radiology:   Imaging results have been reviewed and ECHOCARDIOGRAM COMPLETE  Result Date: 06/05/2022    ECHOCARDIOGRAM REPORT   Patient Name:   THELONIOUS KAUFFMANN Date of Exam: 06/05/2022 Medical Rec #:  [id number]     Height:       70.0 in Accession #:    [id number]    Weight:       169.4 lb Date of Birth:  1940-11-24     BSA:          1.945 m Patient Age:    19 years      BP:           160/77 mmHg Patient Gender: M             HR:           72 bpm. Exam Location:  Inpatient Procedure: 2D Echo, Cardiac Doppler and Color Doppler Indications:    I48.91* Unspeicified atrial fibrillation  History:        Patient has no prior history of  Echocardiogram examinations.                 Risk Factors:Diabetes, Dyslipidemia and Hypertension.  Sonographer:    Phineas Douglas Referring Phys: [id number] [place]  1. Left ventricular ejection fraction, by estimation, is 60 to 65%. The left ventricle has normal function. The left ventricle has no regional wall motion abnormalities. Left ventricular diastolic parameters are indeterminate.  2. Right ventricular systolic function is normal. The right ventricular size is normal. There is normal pulmonary artery systolic pressure. The estimated right ventricular systolic pressure is 19.3 mmHg.  3. The mitral valve is grossly normal. Trivial mitral valve regurgitation. No evidence of mitral  stenosis.  4. The aortic valve is tricuspid. Aortic valve regurgitation is trivial. Aortic valve sclerosis is present, with no evidence of aortic valve stenosis.  5. Aortic dilatation noted. There is borderline dilatation of the aortic root, measuring 38 mm.  6. The inferior vena cava is dilated in size with >50% respiratory variability, suggesting right atrial pressure of 8 mmHg. Comparison(s): No prior Echocardiogram. FINDINGS  Left Ventricle: Left ventricular ejection fraction, by estimation, is 60 to 65%. The left ventricle has normal function. The left ventricle has no regional wall motion abnormalities. The left ventricular internal cavity size was normal in size. There is  no left ventricular hypertrophy. Left ventricular diastolic parameters are indeterminate. Right Ventricle: The right ventricular size is normal. No increase in right ventricular wall thickness. Right ventricular systolic function is normal. There is normal pulmonary artery systolic pressure. The tricuspid regurgitant velocity is 2.53 m/s, and  with an assumed right atrial pressure of 8 mmHg, the estimated right ventricular systolic pressure is 43.1 mmHg. Left Atrium: Left atrial size was normal in size. Right Atrium: Right atrial size was  normal in size. Pericardium: There is no evidence of pericardial effusion. Mitral Valve: The mitral valve is grossly normal. There is mild thickening of the mitral valve leaflet(s). There is mild calcification of the mitral valve leaflet(s). Trivial mitral valve regurgitation. No evidence of mitral valve stenosis. Tricuspid Valve: The tricuspid valve is normal in structure. Tricuspid valve regurgitation is trivial. Aortic Valve: The aortic valve is tricuspid. Aortic valve regurgitation is trivial. Aortic valve sclerosis is present, with no evidence of aortic valve stenosis. Pulmonic Valve: The pulmonic valve was not well visualized. Pulmonic valve regurgitation is trivial. Aorta: Aortic dilatation noted. There is borderline dilatation of the aortic root, measuring 38 mm. Venous: The inferior vena cava is dilated in size with greater than 50% respiratory variability, suggesting right atrial pressure of 8 mmHg. IAS/Shunts: The atrial septum is grossly normal.  LEFT VENTRICLE PLAX 2D LVIDd:         4.70 cm      Diastology LVIDs:         3.10 cm      LV e' medial:    5.55 cm/s LV PW:         1.30 cm      LV E/e' medial:  15.9 LV IVS:        1.00 cm      LV e' lateral:   7.07 cm/s LVOT diam:     2.20 cm      LV E/e' lateral: 12.4 LV SV:         84 LV SV Index:   43 LVOT Area:     3.80 cm  LV Volumes (MOD) LV vol d, MOD A2C: 111.0 ml LV vol d, MOD A4C: 103.0 ml LV vol s, MOD A2C: 48.0 ml LV vol s, MOD A4C: 38.7 ml LV SV MOD A2C:     63.0 ml LV SV MOD A4C:     103.0 ml LV SV MOD BP:      68.6 ml RIGHT VENTRICLE             IVC RV Basal diam:  3.50 cm     IVC diam: 2.10 cm RV S prime:     17.00 cm/s TAPSE (M-mode): 2.1 cm LEFT ATRIUM             Index        RIGHT ATRIUM  Index LA diam:        3.60 cm 1.85 cm/m   RA Area:     13.90 cm LA Vol (A2C):   53.9 ml 27.71 ml/m  RA Volume:   27.20 ml  13.98 ml/m LA Vol (A4C):   47.2 ml 24.27 ml/m LA Biplane Vol: 53.2 ml 27.35 ml/m  AORTIC VALVE LVOT Vmax:   95.40  cm/s LVOT Vmean:  57.600 cm/s LVOT VTI:    0.222 m  AORTA Ao Root diam: 3.80 cm Ao Asc diam:  3.20 cm MITRAL VALVE               TRICUSPID VALVE MV Area (PHT): 3.27 cm    TR Peak grad:   25.6 mmHg MV Decel Time: 232 msec    TR Vmax:        253.00 cm/s MV E velocity: 88.00 cm/s MV A velocity: 79.10 cm/s  SHUNTS MV E/A ratio:  1.11        Systemic VTI:  0.22 m                            Systemic Diam: 2.20 cm Gwyndolyn Kaufman MD Electronically signed by Gwyndolyn Kaufman MD Signature Date/Time: 06/05/2022/11:56:43 AM    Final    DG Chest Portable 1 View  Result Date: 06/04/2022 CLINICAL DATA:  82 year old male with cough for 3 weeks.  Fatigue. EXAM: PORTABLE CHEST 1 VIEW COMPARISON:  Chest radiographs 05/22/2022 and earlier. FINDINGS: Portable AP semi upright view at 0730 hours. Mildly rotated to the right. Lung volumes and mediastinal contours are stable, within normal limits. Allowing for portable technique the lungs are clear. No pneumothorax or pleural effusion. No osseous abnormality identified. IMPRESSION: Negative portable chest. Electronically Signed   By: Genevie Ann M.D.   On: 06/04/2022 08:00    Cardiac Studies:   Coronary Angiography 2017: In [place], [place]: No significant CAD.    Echocardiogram 01/05/2019: Left ventricle cavity is normal in size. Mild concentric hypertrophy of the left ventricle. Normal global wall motion. Normal LV systolic function with EF 55%. Doppler evidence of grade I (impaired) diastolic dysfunction, normal LAP.  Trileaflet aortic valve. Mild (Grade I) aortic regurgitation. Mild (Grade I) mitral regurgitation. Inadequate TR jet to estimate pulmonary artery systolic pressure. Normal right atrial pressure.    Event Monitor for 30 days Start date  12/02/2018 - 12/31/2018: Baseline sample showed Sinus Rhythm with a heart rate of 82.7 bpm. There were 0 critical, 0 serious, and 3 stable events that occurred.  Automatically Detected Events: 1 Stable: Sinus Rhythm w/Run of  V-Tach (5 Beats) at 4 pm. Manually Detected Events: 1 Stable: Sinus Rhythm w/1st Degree AV Block/Artifact  EKG: Sinus rhythm with iLBBB, rate of 75.  Nonspecific ST-T changes.  QTc 445.   Assessment & Recommendations:   Paroxysmal atrial fibrillation with RVR, new onset, currently NSR S/P amiodarone and heparin gtt CHA2DS2VASc = at least 4 Converted back to sinus rhythm D/C on Eliquis 5 mg BID and amio 400 mg BID for 1 week, then 200 mg BID for 1 week, then 200 mg qDay thereafter Discussed with primary Patient ok for discharge from cardiac standpoint   Elevated troponins suspect demand ischemia 2/2 above troponin 21 --> 18 Echocardiogram completed and reviewed   Hyperlipidemia Continue Crestor   CLL, followed by hematology/oncology, Dr. Irene Limbo WBC 61,000 Hemoglobin and platelet count stable.  Patient has scheduled follow-up with Dr Einar Gip on 06/19/2022  Floydene Flock, DO, [facility] 06/05/2022, 11:56 AM Office: [phone]

## 2022-06-05 NOTE — Progress Notes (Signed)
[place] for heparin Indication: atrial fibrillation  Allergies  Allergen Reactions   Amlodipine Besylate Other (See Comments) and Rash   Bystolic [Nebivolol Hcl]    Cephalexin    Doxycycline     Fever/chills   Gemfibrozil    Glipizide    Januvia [Sitagliptin]    Lipitor [Atorvastatin Calcium]    Lisinopril Other (See Comments)    Pt states lisinopril causes severe constipation    Losartan    Metoprolol Other (See Comments)    weakness   Omeprazole    Simvastatin    Gabapentin     Patient Measurements: Height: '5\' 10"'$  (177.8 cm) Weight: 76.8 kg (169 lb 6.4 oz) IBW/kg (Calculated) : 73 Heparin Dosing Weight: 77.4kg  Vital Signs: Temp: 97.7 F (36.5 C) (01/16 0814) Temp Source: Oral (01/16 0814) BP: 138/91 (01/16 0814) Pulse Rate: 71 (01/16 0814)  Labs: Recent Labs    06/04/22 0807 06/04/22 1021 06/04/22 2027 06/05/22 0515  HGB 11.1*  --   --  10.8*  HCT 35.0*  --   --  33.2*  PLT 213  --   --  158  HEPARINUNFRC  --   --  <0.10* 0.66  CREATININE 1.38*  --   --  1.11  TROPONINIHS 21* 18*  --   --      Estimated Creatinine Clearance: 55.7 mL/min (by C-G formula based on SCr of 1.11 mg/dL).   Medical History: Past Medical History:  Diagnosis Date   Chronic Leukemia    Diabetes mellitus without complication ([place])    Hyperlipemia 12/02/2018   Hyperlipidemia    Hypertension    Type 2 diabetes mellitus with complication, without long-term current use of insulin ([place]) 12/02/2018    Medications:  Infusions:   heparin 1,300 Units/hr (06/05/22 0438)   Assessment: 73 yof presented to the ED with weakness. Found to be in afib. To start IV heparin. Baseline Hgb is low and platelets are WNL. He is not on anticoagulation PTA.   Pt to transition to apixaban, qualifies for '5mg'$  BID dose.  Goal of Therapy:  Heparin level 0.3-0.7 units/ml Monitor platelets by anticoagulation protocol: Yes   Plan:  Stop heparin Begin  apixaban '5mg'$  BID  Arrie Senate, PharmD, BCPS, [facility] Clinical Pharmacist [phone] Please check AMION for all [facility] numbers 06/05/2022

## 2022-06-05 NOTE — Progress Notes (Signed)
OT Cancellation Note  Patient Details Name: Eric Harrington MRN: [id number] DOB: 04-28-1941   Cancelled Treatment:    Reason Eval/Treat Not Completed: OT screened, no needs identified, will sign off (Discussed with PT, pt ind with ADLs and mobility, has support from spouse at home, has no current OT needs at this time, will s/o. Please reconsult if there is a change in pt status.)  [name], OTD, OTR/L [facility] [phone]   [name] [place] 06/05/2022, 9:09 AM

## 2022-06-05 NOTE — H&P (Signed)
History and Physical  TAD FANCHER WUJ:[id number] DOB: 1940/08/14 DOA: 06/04/2022  Referring physician: Accepted and admitted by Dr. Tamala Julian, [facility], Hospitalist Service.  PCP: [name], MD  Outpatient Specialists: Oncology, ENT Patient coming from: Home.  Chief Complaint: Generalized fatigue, persistent 2 weeks cough  HPI: Eric Harrington is a 82 y.o. male with medical history significant for CLL, followed by medical oncology Dr. Irene Limbo, hyperlipidemia, type 2 diabetes, diabetic polyneuropathy, history of PACs, transient A-fib with cardiac cath, CKD 3B, irritable bowel syndrome, GERD, history of presyncope, presented to [facility] ED as a direct admit from [facility] ED due to generalized fatigue, persistent 2 weeks cough, hypotension at home, and A-fib with RVR.  Endorses 2 weeks duration of a semiproductive cough.  Night sweats.  Denies any GI symptoms.  In the ED, noted to be in A-fib with RVR, severe leukocytosis with WBC of greater than 60,000 in the setting of CLL.  EDP discussed the case with hematologist on-call who was not overly concerned about it given that his hemoglobin and platelet counts were okay.  EDP also discussed the case with cardiologist on-call Dr. Shellia Carwin who recommended starting the patient on amiodarone drip and heparin drip.  The patient was admitted by Dr. Tamala Julian, [facility], hospitalist service and transferred to [facility] telemetry cardiac unit.  ED Course: Tmax 98.4.  BP 142/78, pulse 73, respiration rate 16, O2 saturation 100% on room air.  Lab studies remarkable for WBC 61.8 thousand, hemoglobin 11.1, platelet count 213.  Sodium 132, glucose 148, BUN 29, creatinine 1.38, GFR 52, troponin 21, 18.  Review of Systems: Review of systems as noted in the HPI. All other systems reviewed and are negative.   Past Medical History:  Diagnosis Date   Chronic Leukemia    Diabetes mellitus without complication ([place])    Hyperlipemia 12/02/2018   Hyperlipidemia    Hypertension     Type 2 diabetes mellitus with complication, without long-term current use of insulin ([place]) 12/02/2018   Past Surgical History:  Procedure Laterality Date   CARDIAC CATHETERIZATION  2017    Social History:  reports that he quit smoking about 44 years ago. His smoking use included cigarettes. He smoked an average of 1 pack per day. He has never used smokeless tobacco. He reports that he does not currently use drugs. He reports that he does not drink alcohol.   Allergies  Allergen Reactions   Bystolic [Nebivolol Hcl]    Cephalexin    Gemfibrozil    Glipizide    Januvia [Sitagliptin]    Lipitor [Atorvastatin Calcium]    Lisinopril Other (See Comments)    Pt states lisinopril causes severe constipation    Losartan    Metoprolol    Omeprazole    Simvastatin    Gabapentin     Family History  Problem Relation Age of Onset   Lung cancer Mother    Heart attack Father    Dementia Sister    Diabetes Brother    Diabetes Sister    Heart attack Brother       Prior to Admission medications   Medication Sig Start Date End Date Taking? Authorizing Provider  AMBULATORY NON FORMULARY MEDICATION Take by mouth 2 (two) times daily. Medication Name: Immuneti Advanced Immune Defense    [provider]  Ascorbic Acid (VITAMIN C) 1000 MG tablet Take 1,000 mg by mouth 2 (two) times a day.    [provider]  aspirin EC 81 MG tablet Take 81 mg by  mouth daily.    [provider]  B Complex Vitamins (VITAMIN-B COMPLEX PO) Take by mouth.    [provider]  carboxymethylcellulose (REFRESH PLUS) 0.5 % SOLN 1 drop 3 (three) times daily as needed.    [provider]  cyanocobalamin (VITAMIN B12) 1000 MCG tablet 1 tablet    [provider]  docusate sodium (COLACE) 100 MG capsule Take 100 mg by mouth daily.     [provider]  esomeprazole (NEXIUM) 20 MG capsule Take 20 mg by mouth daily at 12 noon.    [provider]  FARXIGA 10  MG TABS tablet Take 10 mg by mouth daily. 11/21/20   [provider]  folic acid (FOLVITE) 1 MG tablet TAKE 2 TABLETS BY MOUTH ONCE DAILY FOR 5 DAYS 02/19/21   [provider]  glimepiride (AMARYL) 1 MG tablet Take 1 mg by mouth daily. 11/14/20   [provider]  Glucose Blood (IGLUCOSE TEST STRIPS VI) by In Vitro route.    [provider]  Homeopathic Products (ALLERGY MEDICINE PO) Take by mouth. Patient not taking: Reported on 01/10/2022    [provider]  Lancets MISC by Does not apply route.    [provider]  losartan (COZAAR) 25 MG tablet Take 25 mg by mouth daily after supper. Patient not taking: Reported on 11/25/2020    [provider]  metFORMIN (GLUCOPHAGE) 1000 MG tablet Take 500 mg by mouth 2 (two) times daily.  05/06/17   [provider]  Riboflavin (VITAMIN B-2 PO) Take by mouth daily. Patient not taking: Reported on 01/10/2022    [provider]  Rosuvastatin Calcium 5 MG CPSP Take by mouth daily. 1 tab daily    [provider]  VITAMIN D PO Take by mouth daily.    [provider]    Physical Exam: BP (!) 142/78 (BP Location: Left Arm)   Pulse 74   Temp (!) 97.4 F (36.3 C) (Oral)   Resp 16   Ht '5\' 10"'$  (1.778 m)   Wt 77.4 kg   SpO2 100%   BMI 24.49 kg/m   General: 82 y.o. year-old male well developed well nourished in no acute distress.  Alert and oriented x3. Cardiovascular: Regular rate and rhythm with no rubs or gallops.  No thyromegaly or JVD noted.  No lower extremity edema. 2/4 pulses in all 4 extremities. Respiratory: Mild rales at bases with no wheezing noted. Good inspiratory effort. Abdomen: Soft nontender nondistended with normal bowel sounds x4 quadrants. Muskuloskeletal: No cyanosis, clubbing or edema noted bilaterally Neuro: CN II-XII intact, strength, sensation, reflexes Skin: No ulcerative lesions noted or rashes Psychiatry: Judgement and insight appear  normal. Mood is appropriate for condition and setting          Labs on Admission:  Basic Metabolic Panel: Recent Labs  Lab 06/04/22 0807  NA 132*  K 3.9  CL 97*  CO2 26  GLUCOSE 148*  BUN 29*  CREATININE 1.38*  CALCIUM 8.3*   Liver Function Tests: No results for input(s): "AST", "ALT", "ALKPHOS", "BILITOT", "PROT", "ALBUMIN" in the last 168 hours. No results for input(s): "LIPASE", "AMYLASE" in the last 168 hours. No results for input(s): "AMMONIA" in the last 168 hours. CBC: Recent Labs  Lab 06/04/22 0807  WBC 61.8*  HGB 11.1*  HCT 35.0*  MCV 97.8  PLT 213   Cardiac Enzymes: No results for input(s): "CKTOTAL", "CKMB", "CKMBINDEX", "TROPONINI" in the last 168 hours.  BNP (last 3  results) No results for input(s): "BNP" in the last 8760 hours.  ProBNP (last 3 results) No results for input(s): "PROBNP" in the last 8760 hours.  CBG: Recent Labs  Lab 06/04/22 0810  GLUCAP 152*    Radiological Exams on Admission: DG Chest Portable 1 View  Result Date: 06/04/2022 CLINICAL DATA:  82 year old male with cough for 3 weeks.  Fatigue. EXAM: PORTABLE CHEST 1 VIEW COMPARISON:  Chest radiographs 05/22/2022 and earlier. FINDINGS: Portable AP semi upright view at 0730 hours. Mildly rotated to the right. Lung volumes and mediastinal contours are stable, within normal limits. Allowing for portable technique the lungs are clear. No pneumothorax or pleural effusion. No osseous abnormality identified. IMPRESSION: Negative portable chest. Electronically Signed   By: Genevie Ann M.D.   On: 06/04/2022 08:00    EKG: I independently viewed the EKG done and my findings are as followed: Sinus rhythm rate of 75.  Nonspecific ST-T changes.  QTc 445.  Incomplete LBBB.  Assessment/Plan Present on Admission:  Atrial fibrillation ([place])  Principal Problem:   Atrial fibrillation (HCC)  Paroxysmal atrial fibrillation with RVR Follow TSH Started on amiodarone drip and heparin drip in the  ED CHA2DS2-VASc score of 5 Continue heparin drip for primary CVA prevention.  Switch to DOAC, Eliquis, when okay with cardiology. Converted back to sinus rhythm.  Obtain twelve-lead EKG. Follow 2D echo Cardiology consulted  CLL, followed by hematology/oncology, Dr. Irene Limbo WBC 61,000 Hemoglobin and platelet count stable. EDP discussed the case with hematology oncology, not overly concerned  Cough possible reactive airway disease, rule out active infective process. Presumptive community-acquired pneumonia, POA No evidence of PNA on CXR.  No CT scan images in place to definitely rule out pneumonia.  Treated with azithromycin and prednisone outpatient.  Completed azithromycin about a week ago.  Prednisone completed yesterday. Follow procalcitonin, lactic acid level Follow MRSA screening test. Cover with Rocephin and IV azithromycin until active infective process is ruled out. Covid-19, influenza A&B, RSV, all returned negative. Duonebs q6H Mucinex 600 mg twice daily, hypersaline nebs twice daily x 3 days  Elevated troponin High-sensitivity troponin 21, 18. Follow 2D echo Cardiology consulted  Generalized fatigue, suspect multifactorial secondary to above PT OT assessment Mobilize as tolerated with assistance and fall precautions  Type 2 diabetes with hyperglycemia Hold off home oral hypoglycemics Obtain hemoglobin A1c Start insulin sliding scale.  GERD Resume home esomeprazole  Hyperlipidemia Resume home Crestor    Critical care time: 65 minutes.    DVT prophylaxis: Heparin drip  Code Status: Full code  Family Communication: None at bedside  Disposition Plan: Admitted to telemetry cardiac unit 6E  Consults called: Cardiology consulted by EDP.  Admission status: Observation status.   Status is: Observation    Kayleen Memos MD [facility] Pager [phone]  If 7PM-7AM, please contact night-coverage [email or web address] Password [email or web address]  06/05/2022, 1:39  AM

## 2022-06-05 NOTE — Progress Notes (Signed)
Amanda for heparin Indication: atrial fibrillation  Allergies  Allergen Reactions   Bystolic [Nebivolol Hcl]    Cephalexin    Doxycycline     Fever/chills   Gemfibrozil    Glipizide    Januvia [Sitagliptin]    Lipitor [Atorvastatin Calcium]    Lisinopril Other (See Comments)    Pt states lisinopril causes severe constipation    Losartan    Metoprolol    Omeprazole    Simvastatin    Gabapentin     Patient Measurements: Height: '5\' 10"'$  (177.8 cm) Weight: 76.8 kg (169 lb 6.4 oz) IBW/kg (Calculated) : 73 Heparin Dosing Weight: 77.4kg  Vital Signs: Temp: 97.4 F (36.3 C) (01/16 0055) Temp Source: Oral (01/16 0055) BP: 142/78 (01/16 0055) Pulse Rate: 74 (01/16 0055)  Labs: Recent Labs    06/04/22 0807 06/04/22 1021 06/04/22 2027 06/05/22 0515  HGB 11.1*  --   --  10.8*  HCT 35.0*  --   --  33.2*  PLT 213  --   --  158  HEPARINUNFRC  --   --  <0.10* 0.66  CREATININE 1.38*  --   --  1.11  TROPONINIHS 21* 18*  --   --      Estimated Creatinine Clearance: 55.7 mL/min (by C-G formula based on SCr of 1.11 mg/dL).   Medical History: Past Medical History:  Diagnosis Date   Chronic Leukemia    Diabetes mellitus without complication ([place])    Hyperlipemia 12/02/2018   Hyperlipidemia    Hypertension    Type 2 diabetes mellitus with complication, without long-term current use of insulin ([place]) 12/02/2018    Medications:  Infusions:   azithromycin 500 mg (06/05/22 0434)   cefTRIAXone (ROCEPHIN)  IV 2 g (06/05/22 9326)   heparin 1,300 Units/hr (06/05/22 0438)   Assessment: 65 yof presented to the ED with weakness. Found to be in afib. To start IV heparin. Baseline Hgb is low and platelets are WNL. He is not on anticoagulation PTA.   Initial heparin level is undetectable, no infusion issues per RN report  1/16 AM update:  Heparin level therapeutic   Goal of Therapy:  Heparin level 0.3-0.7 units/ml Monitor platelets by  anticoagulation protocol: Yes   Plan:  Cont heparin 1300 units/hr 1400 heparin level  Narda Bonds, PharmD, BCPS Clinical Pharmacist Phone: [phone]

## 2022-06-06 ENCOUNTER — Encounter (HOSPITAL_COMMUNITY): Payer: Self-pay

## 2022-06-06 ENCOUNTER — Other Ambulatory Visit: Payer: Self-pay

## 2022-06-06 ENCOUNTER — Inpatient Hospital Stay (HOSPITAL_COMMUNITY)
Admission: EM | Admit: 2022-06-06 | Discharge: 2022-06-09 | DRG: 690 | Disposition: A | Discharge status: Home / Self Care | Payer: Medicare Other | Attending: Internal Medicine | Admitting: Internal Medicine

## 2022-06-06 ENCOUNTER — Encounter: Payer: Self-pay | Admitting: Infectious Diseases

## 2022-06-06 DIAGNOSIS — Z8616 Personal history of COVID-19: Secondary | ICD-10-CM

## 2022-06-06 DIAGNOSIS — E785 Hyperlipidemia, unspecified: Secondary | ICD-10-CM | POA: Diagnosis present

## 2022-06-06 DIAGNOSIS — E1122 Type 2 diabetes mellitus with diabetic chronic kidney disease: Secondary | ICD-10-CM | POA: Diagnosis present

## 2022-06-06 DIAGNOSIS — I129 Hypertensive chronic kidney disease with stage 1 through stage 4 chronic kidney disease, or unspecified chronic kidney disease: Secondary | ICD-10-CM | POA: Diagnosis present

## 2022-06-06 DIAGNOSIS — Z1152 Encounter for screening for COVID-19: Secondary | ICD-10-CM

## 2022-06-06 DIAGNOSIS — Z8249 Family history of ischemic heart disease and other diseases of the circulatory system: Secondary | ICD-10-CM

## 2022-06-06 DIAGNOSIS — Z888 Allergy status to other drugs, medicaments and biological substances status: Secondary | ICD-10-CM

## 2022-06-06 DIAGNOSIS — I480 Paroxysmal atrial fibrillation: Secondary | ICD-10-CM | POA: Diagnosis present

## 2022-06-06 DIAGNOSIS — R7881 Bacteremia: Secondary | ICD-10-CM | POA: Diagnosis not present

## 2022-06-06 DIAGNOSIS — C9110 Chronic lymphocytic leukemia of B-cell type not having achieved remission: Secondary | ICD-10-CM | POA: Diagnosis present

## 2022-06-06 DIAGNOSIS — Z833 Family history of diabetes mellitus: Secondary | ICD-10-CM

## 2022-06-06 DIAGNOSIS — E1142 Type 2 diabetes mellitus with diabetic polyneuropathy: Secondary | ICD-10-CM | POA: Diagnosis present

## 2022-06-06 DIAGNOSIS — Z881 Allergy status to other antibiotic agents status: Secondary | ICD-10-CM

## 2022-06-06 DIAGNOSIS — R61 Generalized hyperhidrosis: Secondary | ICD-10-CM | POA: Diagnosis present

## 2022-06-06 DIAGNOSIS — E118 Type 2 diabetes mellitus with unspecified complications: Secondary | ICD-10-CM | POA: Diagnosis present

## 2022-06-06 DIAGNOSIS — N1831 Chronic kidney disease, stage 3a: Secondary | ICD-10-CM | POA: Diagnosis present

## 2022-06-06 DIAGNOSIS — Z7984 Long term (current) use of oral hypoglycemic drugs: Secondary | ICD-10-CM

## 2022-06-06 DIAGNOSIS — E1169 Type 2 diabetes mellitus with other specified complication: Secondary | ICD-10-CM | POA: Diagnosis present

## 2022-06-06 DIAGNOSIS — Z87891 Personal history of nicotine dependence: Secondary | ICD-10-CM

## 2022-06-06 DIAGNOSIS — N390 Urinary tract infection, site not specified: Principal | ICD-10-CM | POA: Diagnosis present

## 2022-06-06 DIAGNOSIS — Z7901 Long term (current) use of anticoagulants: Secondary | ICD-10-CM

## 2022-06-06 DIAGNOSIS — B952 Enterococcus as the cause of diseases classified elsewhere: Secondary | ICD-10-CM | POA: Diagnosis present

## 2022-06-06 DIAGNOSIS — Z79899 Other long term (current) drug therapy: Secondary | ICD-10-CM

## 2022-06-06 LAB — CBC WITH DIFFERENTIAL/PLATELET
Abs Immature Granulocytes: 0.08 10*3/uL — ABNORMAL HIGH (ref 0.00–0.07)
Basophils Absolute: 0.1 10*3/uL (ref 0.0–0.1)
Basophils Relative: 0 %
Eosinophils Absolute: 0 10*3/uL (ref 0.0–0.5)
Eosinophils Relative: 0 %
HCT: 36.2 % — ABNORMAL LOW (ref 39.0–52.0)
Hemoglobin: 11.1 g/dL — ABNORMAL LOW (ref 13.0–17.0)
Immature Granulocytes: 0 %
Lymphocytes Relative: 89 %
Lymphs Abs: 35.4 10*3/uL — ABNORMAL HIGH (ref 0.7–4.0)
MCH: 31.5 pg (ref 26.0–34.0)
MCHC: 30.7 g/dL (ref 30.0–36.0)
MCV: 102.8 fL — ABNORMAL HIGH (ref 80.0–100.0)
Monocytes Absolute: 1.6 10*3/uL — ABNORMAL HIGH (ref 0.1–1.0)
Monocytes Relative: 4 %
Neutro Abs: 2.7 10*3/uL (ref 1.7–7.7)
Neutrophils Relative %: 7 %
Platelets: 143 10*3/uL — ABNORMAL LOW (ref 150–400)
RBC: 3.52 MIL/uL — ABNORMAL LOW (ref 4.22–5.81)
RDW: 14 % (ref 11.5–15.5)
WBC: 39.8 10*3/uL — ABNORMAL HIGH (ref 4.0–10.5)
nRBC: 0.1 % (ref 0.0–0.2)

## 2022-06-06 LAB — BLOOD CULTURE ID PANEL (REFLEXED) - BCID2

## 2022-06-06 LAB — COMPREHENSIVE METABOLIC PANEL
ALT: 14 U/L (ref 0–44)
AST: 15 U/L (ref 15–41)
Albumin: 3.4 g/dL — ABNORMAL LOW (ref 3.5–5.0)
Alkaline Phosphatase: 87 U/L (ref 38–126)
Anion gap: 10 (ref 5–15)
BUN: 14 mg/dL (ref 8–23)
CO2: 21 mmol/L — ABNORMAL LOW (ref 22–32)
Calcium: 8.8 mg/dL — ABNORMAL LOW (ref 8.9–10.3)
Chloride: 103 mmol/L (ref 98–111)
Creatinine, Ser: 1.3 mg/dL — ABNORMAL HIGH (ref 0.61–1.24)
GFR, Estimated: 56 mL/min — ABNORMAL LOW (ref >60)
Glucose, Bld: 127 mg/dL — ABNORMAL HIGH (ref 70–99)
Potassium: 4.6 mmol/L (ref 3.5–5.1)
Sodium: 134 mmol/L — ABNORMAL LOW (ref 135–145)
Total Bilirubin: 0.8 mg/dL (ref 0.3–1.2)
Total Protein: 5.9 g/dL — ABNORMAL LOW (ref 6.5–8.1)

## 2022-06-06 LAB — LACTIC ACID, PLASMA: Lactic Acid, Venous: 1.3 mmol/L (ref 0.5–1.9)

## 2022-06-06 LAB — GLUCOSE, CAPILLARY: Glucose-Capillary: 237 mg/dL — ABNORMAL HIGH (ref 70–99)

## 2022-06-06 MED ORDER — LACTATED RINGERS IV BOLUS (SEPSIS): 500.0000 mL | Freq: Once | INTRAVENOUS | Status: DC

## 2022-06-06 MED ORDER — ACETAMINOPHEN 325 MG PO TABS: 650.0000 mg | ORAL_TABLET | Freq: Four times a day (QID) | ORAL | Status: DC | PRN

## 2022-06-06 MED ORDER — SENNOSIDES-DOCUSATE SODIUM 8.6-50 MG PO TABS: 1.0000 | ORAL_TABLET | Freq: Every evening | ORAL | Status: DC | PRN

## 2022-06-06 MED ORDER — APIXABAN 5 MG PO TABS
5.0000 mg | ORAL_TABLET | Freq: Two times a day (BID) | ORAL | Status: DC
  Administered 2022-06-07 – 2022-06-09 (×5): 5 mg via ORAL
  Filled 2022-06-06 (×5): qty 1

## 2022-06-06 MED ORDER — VANCOMYCIN HCL 1750 MG/350ML IV SOLN
1750.0000 mg | Freq: Once | INTRAVENOUS | Status: AC
  Administered 2022-06-06: 1750 mg via INTRAVENOUS
  Filled 2022-06-06: qty 350

## 2022-06-06 MED ORDER — ONDANSETRON HCL 4 MG/2ML IJ SOLN: 4.0000 mg | Freq: Four times a day (QID) | INTRAMUSCULAR | Status: DC | PRN

## 2022-06-06 MED ORDER — LACTATED RINGERS IV SOLN: INTRAVENOUS | Status: DC

## 2022-06-06 MED ORDER — ROSUVASTATIN CALCIUM 5 MG PO TABS
5.0000 mg | ORAL_TABLET | Freq: Every day | ORAL | Status: DC
  Administered 2022-06-07 – 2022-06-09 (×3): 5 mg via ORAL
  Filled 2022-06-06 (×3): qty 1

## 2022-06-06 MED ORDER — AMIODARONE HCL 200 MG PO TABS
400.0000 mg | ORAL_TABLET | Freq: Two times a day (BID) | ORAL | Status: DC
  Administered 2022-06-07 – 2022-06-09 (×5): 400 mg via ORAL
  Filled 2022-06-06 (×5): qty 2

## 2022-06-06 MED ORDER — ACETAMINOPHEN 650 MG RE SUPP: 650.0000 mg | Freq: Four times a day (QID) | RECTAL | Status: DC | PRN

## 2022-06-06 MED ORDER — ONDANSETRON HCL 4 MG PO TABS: 4.0000 mg | ORAL_TABLET | Freq: Four times a day (QID) | ORAL | Status: DC | PRN

## 2022-06-06 MED ORDER — VANCOMYCIN HCL 750 MG/150ML IV SOLN
750.0000 mg | INTRAVENOUS | Status: DC
  Administered 2022-06-07: 750 mg via INTRAVENOUS
  Filled 2022-06-06 (×2): qty 150

## 2022-06-06 MED ORDER — INSULIN ASPART 100 UNIT/ML IJ SOLN
0.0000 [IU] | Freq: Three times a day (TID) | INTRAMUSCULAR | Status: DC
  Administered 2022-06-07: 1 [IU] via SUBCUTANEOUS
  Administered 2022-06-07: 2 [IU] via SUBCUTANEOUS
  Administered 2022-06-07 – 2022-06-08 (×2): 1 [IU] via SUBCUTANEOUS
  Administered 2022-06-08 (×2): 2 [IU] via SUBCUTANEOUS
  Administered 2022-06-09: 1 [IU] via SUBCUTANEOUS

## 2022-06-06 NOTE — Hospital Course (Signed)
Eric Harrington is a 82 y.o. male with medical history significant for CLL, PAF on Eliquis, T2DM, CKD stage IIIa, HTN, HLD who is admitted with blood culture positive for Enterococcus faecalis.

## 2022-06-06 NOTE — Assessment & Plan Note (Signed)
Hold home meds and placed on SSI. 

## 2022-06-06 NOTE — H&P (Signed)
History and Physical    Eric Harrington OJJ:[id number] DOB: [birth year] DOA: 06/06/2022  PCP: [name], MD  Patient coming from: Home  I have personally briefly reviewed patient's old medical records in [place]  Chief Complaint: Positive blood culture  HPI: Eric Harrington is a 82 y.o. male with medical history significant for CLL, PAF on Eliquis, T2DM, CKD stage IIIa, HTN, HLD who presented to the ED for evaluation of positive blood culture.  Patient recently admitted 06/04/2022-06/05/2022 for atrial fibrillation with RVR.  Initially treated with IV amiodarone and heparin gtt with conversion back to sinus rhythm.  He was discharged on oral amiodarone load and Eliquis.  Patient was called back today by infectious disease (1/17) that a blood culture drawn 1/16 was positive for Enterococcus faecalis and advised to present to the ED for IV antibiotics.  Patient states he is currently feeling well, better than when he left the hospital yesterday.  He reports chronic issues with diaphoresis related to his CLL otherwise no fevers or chills.  Denies chest pain, dyspnea, nausea, vomiting, abdominal pain, dysuria, diarrhea.  He reports a chronic cough at nighttime.  ED Course  Labs/Imaging on admission: I have personally reviewed following labs and imaging studies.  Initial vitals showed 139/78, pulse 73, RR 16, temp 98.1 F, SpO2 99% on room air.  Labs show WBC 39.8 with lymphocyte predominance, hemoglobin 11.1, platelets 143,000, sodium 134, potassium 4.6, bicarb 21, BUN 14, creatinine 1.30, serum glucose 127, lactic acid 1.3.  Patient was started on IV vancomycin and the hospitalist service was consulted to admit for further evaluation and management.  Review of Systems: All systems reviewed and are negative except as documented in history of present illness above.   Past Medical History:  Diagnosis Date   Chronic Leukemia    Diabetes mellitus without complication ([place])     Hyperlipemia 12/02/2018   Hyperlipidemia    Hypertension    Type 2 diabetes mellitus with complication, without long-term current use of insulin ([place]) 12/02/2018    Past Surgical History:  Procedure Laterality Date   CARDIAC CATHETERIZATION  2017    Social History:  reports that he quit smoking about 44 years ago. His smoking use included cigarettes. He smoked an average of 1 pack per day. He has never used smokeless tobacco. He reports that he does not currently use drugs. He reports that he does not drink alcohol.  Allergies  Allergen Reactions   Amlodipine Besylate Other (See Comments) and Rash   Bystolic [Nebivolol Hcl]    Cephalexin    Doxycycline     Fever/chills   Gemfibrozil    Glipizide    Januvia [Sitagliptin]    Lipitor [Atorvastatin Calcium]    Lisinopril Other (See Comments)    Pt states lisinopril causes severe constipation    Losartan    Metoprolol Other (See Comments)    weakness   Omeprazole    Simvastatin    Gabapentin     Family History  Problem Relation Age of Onset   Lung cancer Mother    Heart attack Father    Dementia Sister    Diabetes Brother    Diabetes Sister    Heart attack Brother      Prior to Admission medications   Medication Sig Start Date End Date Taking? Authorizing Provider  albuterol (VENTOLIN HFA) 108 (90 Base) MCG/ACT inhaler Inhale 1 puff into the lungs every 4 (four) hours as needed for wheezing or shortness of breath.  [provider]  amiodarone (PACERONE) 200 MG tablet Take 2 tablets (400 mg total) by mouth 2 (two) times daily for 7 days, THEN 1 tablet (200 mg total) 2 (two) times daily for 7 days, THEN 1 tablet (200 mg total) daily for 16 days. 06/05/22 07/05/22  Patrecia Pour, MD  apixaban (ELIQUIS) 5 MG TABS tablet Take 1 tablet (5 mg total) by mouth 2 (two) times daily. 06/05/22   Patrecia Pour, MD  Ascorbic Acid (VITAMIN C) 1000 MG tablet Take 1,000 mg by mouth daily.    [provider]  B Complex  Vitamins (VITAMIN-B COMPLEX PO) Take 1 tablet by mouth daily.    [provider]  carboxymethylcellulose (REFRESH PLUS) 0.5 % SOLN Place 1 drop into both eyes daily as needed (dry eyes).    [provider]  cetirizine (ZYRTEC) 10 MG tablet Take 10 mg by mouth daily as needed for allergies.    [provider]  cyanocobalamin (VITAMIN B12) 1000 MCG tablet Take 1,000 mcg by mouth daily.    [provider]  docusate sodium (COLACE) 100 MG capsule Take 100 mg by mouth daily.     [provider]  esomeprazole (NEXIUM) 20 MG capsule Take 20 mg by mouth daily at 12 noon.    [provider]  FARXIGA 10 MG TABS tablet Take 10 mg by mouth daily. 11/21/20   [provider]  fluticasone (FLONASE) 50 MCG/ACT nasal spray Place 1 spray into both nostrils daily as needed for allergies.    [provider]  glimepiride (AMARYL) 2 MG tablet Take 2 mg by mouth daily with breakfast. 11/14/20   [provider]  Glucose Blood (IGLUCOSE TEST STRIPS VI) by In Vitro route.    [provider]  guaiFENesin-codeine 100-10 MG/5ML syrup Take 10 mLs by mouth every 4 (four) hours as needed for cough. 05/23/22   [provider]  Lancets MISC by Does not apply route.    [provider]  metFORMIN (GLUCOPHAGE) 1000 MG tablet Take 500 mg by mouth 2 (two) times daily.  05/06/17   [provider]  rosuvastatin (CRESTOR) 5 MG tablet Take 5 mg by mouth daily.    [provider]  tobramycin (TOBREX) 0.3 % ophthalmic solution Place 1 drop into both eyes every 4 (four) hours. 05/21/22   [provider]  VITAMIN D PO Take 1,000 Units by mouth daily.    [provider]    Physical Exam: Vitals:   06/06/22 1118 06/06/22 1504 06/06/22 1653 06/06/22 1956  BP: 139/78 110/69 (!) 145/78 (!) 150/81  Pulse: 73 82 (!) 103 72  Resp: '16 16 16 17  '$ Temp: 98.1 F (36.7 C) 98 F (36.7 C)  98.1 F (36.7 C)  TempSrc:  Oral   Oral  SpO2: 99% 98% 98% 100%   Constitutional: Resting in bed, NAD, calm, comfortable Eyes: EOMI, lids and conjunctivae normal ENMT: Mucous membranes are moist. Posterior pharynx clear of any exudate or lesions.Normal dentition.  Neck: normal, supple, no masses. Respiratory: clear to auscultation bilaterally, no wheezing, no crackles. Normal respiratory effort. No accessory muscle use.  Cardiovascular: Regular rate and rhythm, no murmurs / rubs / gallops. No extremity edema. 2+ pedal pulses. Abdomen: no tenderness, no masses palpated.  Musculoskeletal: no clubbing / cyanosis. No joint deformity upper and lower extremities. Good ROM, no contractures. Normal muscle tone.  Skin: no rashes, lesions, ulcers. No induration Neurologic: Sensation intact. Strength 5/5 in all 4.  Psychiatric:  Normal judgment and insight. Alert and oriented x 3. Normal mood.   EKG: Ordered and pending.  Assessment/Plan Principal Problem:   Positive blood culture (Enterococcus faecalis) Active Problems:   PAF (paroxysmal atrial fibrillation) (HCC)   Type 2 diabetes mellitus with complication, without long-term current use of insulin (HCC)   CLL (chronic lymphocytic leukemia) (HCC)   Chronic kidney disease, stage 3a (HCC)   Hyperlipemia   Eric Harrington is a 82 y.o. male with medical history significant for CLL, PAF on Eliquis, T2DM, CKD stage IIIa, HTN, HLD who is admitted with blood culture positive for Enterococcus faecalis.  Assessment and Plan: * Positive blood culture (Enterococcus faecalis) 1 of 4 blood cultures collected 06/05/2022 has resulted positive for Enterococcus faecalis.  Sensitivities pending.  No obvious infectious source.  ID recommended starting vancomycin. -Continue IV vancomycin pending sensitivities -Follow blood cultures from 1/16 and 1/17 -TTE 1/16 without evidence of endocarditis  PAF (paroxysmal atrial fibrillation) ([place]) Recent admit for RVR, now rate controlled.  -Continue  amiodarone 400 mg BID x 1 week, then 200 mg BID x 1 week, then 200 mg daily -Continue Eliquis  Type 2 diabetes mellitus with complication, without long-term current use of insulin ([place]) Hold home meds and placed on SSI.  Chronic kidney disease, stage 3a ([place]) Function stable.  CLL (chronic lymphocytic leukemia) (HCC) Follows with heme-onc, Dr. Irene Limbo, on active surveillance. WBC 39.8 on admit, closer to baseline compared to previous 61.8 on 06/04/2022.  Hyperlipemia Continue rosuvastatin.  DVT prophylaxis:  apixaban (ELIQUIS) tablet 5 mg   Code Status: Full code, confirmed with patient on admission Family Communication: Daughter at bedside Disposition Plan: From home and likely discharge to home pending clinical progress Consults called: None Severity of Illness: The appropriate patient status for this patient is OBSERVATION. Observation status is judged to be reasonable and necessary in order to provide the required intensity of service to ensure the patient's safety. The patient's presenting symptoms, physical exam findings, and initial radiographic and laboratory data in the context of their medical condition is felt to place them at decreased risk for further clinical deterioration. Furthermore, it is anticipated that the patient will be medically stable for discharge from the hospital within 2 midnights of admission.   Zada Finders MD [facility]  If 7PM-7AM, please contact night-coverage [email or web address]  06/06/2022, 8:21 PM

## 2022-06-06 NOTE — ED Provider Notes (Signed)
[facility] EMERGENCY DEPARTMENT Provider Note   CSN: [id number] Arrival date & time: 06/06/22  1033     History  No chief complaint on file.   Eric Harrington is a 82 y.o. male.  Presents the ED today for further evaluation.  He was just discharged yesterday from the hospital after having been admitted for atrial fibrillation.  He states he is called back for positive blood culture.  He reports he was seen in triage by the infectious disease PA who reported to him that she was going to order vancomycin pending repeat blood cultures as this may have been a contaminated specimen.  Patient is well-appearing, states he is feeling much better than when he was admitted to the hospital initially.  HPI     Home Medications Prior to Admission medications   Medication Sig Start Date End Date Taking? Authorizing Provider  albuterol (VENTOLIN HFA) 108 (90 Base) MCG/ACT inhaler Inhale 1 puff into the lungs every 4 (four) hours as needed for wheezing or shortness of breath.    [provider]  amiodarone (PACERONE) 200 MG tablet Take 2 tablets (400 mg total) by mouth 2 (two) times daily for 7 days, THEN 1 tablet (200 mg total) 2 (two) times daily for 7 days, THEN 1 tablet (200 mg total) daily for 16 days. 06/05/22 07/05/22  Patrecia Pour, MD  apixaban (ELIQUIS) 5 MG TABS tablet Take 1 tablet (5 mg total) by mouth 2 (two) times daily. 06/05/22   Patrecia Pour, MD  Ascorbic Acid (VITAMIN C) 1000 MG tablet Take 1,000 mg by mouth daily.    [provider]  B Complex Vitamins (VITAMIN-B COMPLEX PO) Take 1 tablet by mouth daily.    [provider]  carboxymethylcellulose (REFRESH PLUS) 0.5 % SOLN Place 1 drop into both eyes daily as needed (dry eyes).    [provider]  cetirizine (ZYRTEC) 10 MG tablet Take 10 mg by mouth daily as needed for allergies.    [provider]  cyanocobalamin (VITAMIN B12) 1000 MCG tablet Take 1,000 mcg by mouth daily.     [provider]  docusate sodium (COLACE) 100 MG capsule Take 100 mg by mouth daily.     [provider]  esomeprazole (NEXIUM) 20 MG capsule Take 20 mg by mouth daily at 12 noon.    [provider]  FARXIGA 10 MG TABS tablet Take 10 mg by mouth daily. 11/21/20   [provider]  fluticasone (FLONASE) 50 MCG/ACT nasal spray Place 1 spray into both nostrils daily as needed for allergies.    [provider]  glimepiride (AMARYL) 2 MG tablet Take 2 mg by mouth daily with breakfast. 11/14/20   [provider]  Glucose Blood (IGLUCOSE TEST STRIPS VI) by In Vitro route.    [provider]  guaiFENesin-codeine 100-10 MG/5ML syrup Take 10 mLs by mouth every 4 (four) hours as needed for cough. 05/23/22   [provider]  Lancets MISC by Does not apply route.    [provider]  metFORMIN (GLUCOPHAGE) 1000 MG tablet Take 500 mg by mouth 2 (two) times daily.  05/06/17   [provider]  rosuvastatin (CRESTOR) 5 MG tablet Take 5 mg by mouth daily.    [provider]  tobramycin (TOBREX) 0.3 % ophthalmic solution Place 1 drop into both eyes every 4 (four) hours. 05/21/22   [provider]  VITAMIN D PO Take 1,000 Units by mouth daily.  [provider]      Allergies    Amlodipine besylate, Bystolic [nebivolol hcl], Cephalexin, Doxycycline, Gemfibrozil, Glipizide, Januvia [sitagliptin], Lipitor [atorvastatin calcium], Lisinopril, Losartan, Metoprolol, Omeprazole, Simvastatin, and Gabapentin    Review of Systems   Review of Systems  Constitutional:  Negative for chills, diaphoresis and fever.    Physical Exam Updated Vital Signs BP (!) 145/78   Pulse (!) 103   Temp 98 F (36.7 C)   Resp 16   SpO2 98%  Physical Exam Vitals and nursing note reviewed.  Constitutional:      General: He is not in acute distress.    Appearance: He is well-developed.  HENT:     Head: Normocephalic and  atraumatic.  Eyes:     Conjunctiva/sclera: Conjunctivae normal.  Cardiovascular:     Rate and Rhythm: Normal rate and regular rhythm.     Heart sounds: No murmur heard. Pulmonary:     Effort: Pulmonary effort is normal. No respiratory distress.     Breath sounds: Normal breath sounds.  Abdominal:     Palpations: Abdomen is soft.     Tenderness: There is no abdominal tenderness.  Musculoskeletal:        General: No swelling.     Cervical back: Neck supple.  Skin:    General: Skin is warm and dry.     Capillary Refill: Capillary refill takes less than 2 seconds.  Neurological:     Mental Status: He is alert.  Psychiatric:        Mood and Affect: Mood normal.     ED Results / Procedures / Treatments   Labs (all labs ordered are listed, but only abnormal results are displayed) Labs Reviewed  COMPREHENSIVE METABOLIC PANEL - Abnormal; Notable for the following components:      Result Value   Sodium 134 (*)    CO2 21 (*)    Glucose, Bld 127 (*)    Creatinine, Ser 1.30 (*)    Calcium 8.8 (*)    Total Protein 5.9 (*)    Albumin 3.4 (*)    GFR, Estimated 56 (*)    All other components within normal limits  CBC WITH DIFFERENTIAL/PLATELET - Abnormal; Notable for the following components:   WBC 39.8 (*)    RBC 3.52 (*)    Hemoglobin 11.1 (*)    HCT 36.2 (*)    MCV 102.8 (*)    Platelets 143 (*)    Lymphs Abs 35.4 (*)    Monocytes Absolute 1.6 (*)    Abs Immature Granulocytes 0.08 (*)    All other components within normal limits  CULTURE, BLOOD (ROUTINE X 2)  CULTURE, BLOOD (ROUTINE X 2)  CULTURE, BLOOD (ROUTINE X 2)  LACTIC ACID, PLASMA  BASIC METABOLIC PANEL  APTT    EKG None  Radiology ECHOCARDIOGRAM COMPLETE  Result Date: 06/05/2022    ECHOCARDIOGRAM REPORT   Patient Name:   Eric Harrington Date of Exam: 06/05/2022 Medical Rec #:  [id number]     Height:       70.0 in Accession #:    [id number]    Weight:       169.4 lb Date of Birth:  Apr 23, 1941     BSA:           1.945 m Patient Age:    48 years      BP:           160/77 mmHg Patient Gender: M  HR:           72 bpm. Exam Location:  Inpatient Procedure: 2D Echo, Cardiac Doppler and Color Doppler Indications:    I48.91* Unspeicified atrial fibrillation  History:        Patient has no prior history of Echocardiogram examinations.                 Risk Factors:Diabetes, Dyslipidemia and Hypertension.  Sonographer:    Phineas Douglas Referring Phys: [id number] [place]  1. Left ventricular ejection fraction, by estimation, is 60 to 65%. The left ventricle has normal function. The left ventricle has no regional wall motion abnormalities. Left ventricular diastolic parameters are indeterminate.  2. Right ventricular systolic function is normal. The right ventricular size is normal. There is normal pulmonary artery systolic pressure. The estimated right ventricular systolic pressure is 68.1 mmHg.  3. The mitral valve is grossly normal. Trivial mitral valve regurgitation. No evidence of mitral stenosis.  4. The aortic valve is tricuspid. Aortic valve regurgitation is trivial. Aortic valve sclerosis is present, with no evidence of aortic valve stenosis.  5. Aortic dilatation noted. There is borderline dilatation of the aortic root, measuring 38 mm.  6. The inferior vena cava is dilated in size with >50% respiratory variability, suggesting right atrial pressure of 8 mmHg. Comparison(s): No prior Echocardiogram. FINDINGS  Left Ventricle: Left ventricular ejection fraction, by estimation, is 60 to 65%. The left ventricle has normal function. The left ventricle has no regional wall motion abnormalities. The left ventricular internal cavity size was normal in size. There is  no left ventricular hypertrophy. Left ventricular diastolic parameters are indeterminate. Right Ventricle: The right ventricular size is normal. No increase in right ventricular wall thickness. Right ventricular systolic function is normal. There  is normal pulmonary artery systolic pressure. The tricuspid regurgitant velocity is 2.53 m/s, and  with an assumed right atrial pressure of 8 mmHg, the estimated right ventricular systolic pressure is 15.7 mmHg. Left Atrium: Left atrial size was normal in size. Right Atrium: Right atrial size was normal in size. Pericardium: There is no evidence of pericardial effusion. Mitral Valve: The mitral valve is grossly normal. There is mild thickening of the mitral valve leaflet(s). There is mild calcification of the mitral valve leaflet(s). Trivial mitral valve regurgitation. No evidence of mitral valve stenosis. Tricuspid Valve: The tricuspid valve is normal in structure. Tricuspid valve regurgitation is trivial. Aortic Valve: The aortic valve is tricuspid. Aortic valve regurgitation is trivial. Aortic valve sclerosis is present, with no evidence of aortic valve stenosis. Pulmonic Valve: The pulmonic valve was not well visualized. Pulmonic valve regurgitation is trivial. Aorta: Aortic dilatation noted. There is borderline dilatation of the aortic root, measuring 38 mm. Venous: The inferior vena cava is dilated in size with greater than 50% respiratory variability, suggesting right atrial pressure of 8 mmHg. IAS/Shunts: The atrial septum is grossly normal.  LEFT VENTRICLE PLAX 2D LVIDd:         4.70 cm      Diastology LVIDs:         3.10 cm      LV e' medial:    5.55 cm/s LV PW:         1.30 cm      LV E/e' medial:  15.9 LV IVS:        1.00 cm      LV e' lateral:   7.07 cm/s LVOT diam:     2.20 cm  LV E/e' lateral: 12.4 LV SV:         84 LV SV Index:   43 LVOT Area:     3.80 cm  LV Volumes (MOD) LV vol d, MOD A2C: 111.0 ml LV vol d, MOD A4C: 103.0 ml LV vol s, MOD A2C: 48.0 ml LV vol s, MOD A4C: 38.7 ml LV SV MOD A2C:     63.0 ml LV SV MOD A4C:     103.0 ml LV SV MOD BP:      68.6 ml RIGHT VENTRICLE             IVC RV Basal diam:  3.50 cm     IVC diam: 2.10 cm RV S prime:     17.00 cm/s TAPSE (M-mode): 2.1 cm LEFT  ATRIUM             Index        RIGHT ATRIUM           Index LA diam:        3.60 cm 1.85 cm/m   RA Area:     13.90 cm LA Vol (A2C):   53.9 ml 27.71 ml/m  RA Volume:   27.20 ml  13.98 ml/m LA Vol (A4C):   47.2 ml 24.27 ml/m LA Biplane Vol: 53.2 ml 27.35 ml/m  AORTIC VALVE LVOT Vmax:   95.40 cm/s LVOT Vmean:  57.600 cm/s LVOT VTI:    0.222 m  AORTA Ao Root diam: 3.80 cm Ao Asc diam:  3.20 cm MITRAL VALVE               TRICUSPID VALVE MV Area (PHT): 3.27 cm    TR Peak grad:   25.6 mmHg MV Decel Time: 232 msec    TR Vmax:        253.00 cm/s MV E velocity: 88.00 cm/s MV A velocity: 79.10 cm/s  SHUNTS MV E/A ratio:  1.11        Systemic VTI:  0.22 m                            Systemic Diam: 2.20 cm Gwyndolyn Kaufman MD Electronically signed by Gwyndolyn Kaufman MD Signature Date/Time: 06/05/2022/11:56:43 AM    Final     Procedures Procedures    Medications Ordered in ED Medications  vancomycin (VANCOREADY) IVPB 1750 mg/350 mL (has no administration in time range)  vancomycin (VANCOREADY) IVPB 750 mg/150 mL (has no administration in time range)  lactated ringers infusion (has no administration in time range)    ED Course/ Medical Decision Making/ A&P                             Medical Decision Making This patient presents to the ED for concern of positive blood culture, this involves an extensive number of treatment options, and is a complaint that carries with it a high risk of complications and morbidity.  The differential diagnosis includes bacteremia, contaminated specimen, other   Co morbidities that complicate the patient evaluation he has CLL and this is trending down over the past several days  CLL   Additional history obtained:  Additional history obtained from EMR External records from outside source obtained and reviewed including infectious disease note, recent hospitalization discharge   Lab Tests:  I Ordered, and personally interpreted labs.  The pertinent results  include: White blood cell count is 39.8-  Consultations Obtained:  I requested consultation with the close Dr. Posey Pronto,  and discussed lab and imaging findings as well as pertinent plan - they recommend: Admission   Problem List / ED Course / Critical interventions / Medication management  Positive blood culture for Enterococcus faecalis-patient is asymptomatic, this could have been a can contaminant, infectious disease had already seen him and started vancomycin, will admit blood cultures    Test / Admission - Considered:  Consider chest x-ray and urinalysis but patient recently had these test done, no indication for repeat as patient is asymptomatic.    Amount and/or Complexity of Data Reviewed Labs: ordered. ECG/medicine tests: ordered.  Risk Prescription drug management. Decision regarding hospitalization.           Final Clinical Impression(s) / ED Diagnoses Final diagnoses:  None    Rx / DC Orders ED Discharge Orders     None         Darci Current 06/06/22 1857    Blanchie Dessert, MD 06/06/22 2359

## 2022-06-06 NOTE — Progress Notes (Signed)
Pharmacy Antibiotic Note  Eric Harrington is a 82 y.o. male admitted on 06/06/2022 with bacteremia. PMH CKD. Patient recently discharged from [facility] for afib with RVR on 1/16. BCID positive for e.faecalis after discharge. Patient return to ED for treatment. Pharmacy has been consulted for vancomycin dosing.  Wbc 39.8 and afebrile. LA 1.3. Scr 1.3 [BL ~1.1].   Plan: Give vanc loading 1750 mg IV x1 Start vanc maintenance 750 mg IV q24h  Obtain vancomycin peak and trough levels for goal AUC 400-550  F/u blood culture results and sensitivities      Temp (24hrs), Avg:98.1 F (36.7 C), Min:98.1 F (36.7 C), Max:98.1 F (36.7 C)  Recent Labs  Lab 06/04/22 0807 06/05/22 0515 06/05/22 0822 06/06/22 1134 06/06/22 1144  WBC 61.8* 52.0*  --  39.8*  --   CREATININE 1.38* 1.11  --  1.30*  --   LATICACIDVEN  --  0.9 0.7  --  1.3    Estimated Creatinine Clearance: 47.6 mL/min (A) (by C-G formula based on SCr of 1.3 mg/dL (H)).    Allergies  Allergen Reactions   Amlodipine Besylate Other (See Comments) and Rash   Bystolic [Nebivolol Hcl]    Cephalexin    Doxycycline     Fever/chills   Gemfibrozil    Glipizide    Januvia [Sitagliptin]    Lipitor [Atorvastatin Calcium]    Lisinopril Other (See Comments)    Pt states lisinopril causes severe constipation    Losartan    Metoprolol Other (See Comments)    weakness   Omeprazole    Simvastatin    Gabapentin     Antimicrobials this admission: Vancomycin 1/17 >>   Dose adjustments this admission: N/a  Microbiology results: 1/17 BCx: sent    Thank you for allowing pharmacy to be a part of this patient's care.  Gena Fray, PharmD PGY1 Pharmacy Resident   06/06/2022 2:46 PM

## 2022-06-06 NOTE — Progress Notes (Addendum)
Called patient, verified name as well as DOB regarding blood cx + for E faecalis on 1/16. Recommended to come back to ED, start Vancomycin once patient arrives.

## 2022-06-06 NOTE — Assessment & Plan Note (Signed)
Follows with heme-onc, Dr. Irene Limbo, on active surveillance. WBC 39.8 on admit, closer to baseline compared to previous 61.8 on 06/04/2022.

## 2022-06-06 NOTE — Assessment & Plan Note (Signed)
Recent admit for RVR, now rate controlled.  -Continue amiodarone 400 mg BID x 1 week, then 200 mg BID x 1 week, then 200 mg daily -Continue Eliquis

## 2022-06-06 NOTE — Assessment & Plan Note (Signed)
Function stable.

## 2022-06-06 NOTE — Progress Notes (Signed)
Elink following code sepsis

## 2022-06-06 NOTE — Assessment & Plan Note (Signed)
1 of 4 blood cultures collected 06/05/2022 has resulted positive for Enterococcus faecalis.  Sensitivities pending.  No obvious infectious source.  ID recommended starting vancomycin. -Continue IV vancomycin pending sensitivities -Follow blood cultures from 1/16 and 1/17 -TTE 1/16 without evidence of endocarditis

## 2022-06-06 NOTE — ED Provider Triage Note (Signed)
Emergency Medicine Provider Triage Evaluation Note  Eric Harrington , a 82 y.o. male  was evaluated in triage.  Patient presenting due to positive blood cultures yesterday.  ID called him and told him to come back.  Asymptomatic.  Review of Systems  Positive:  Negative:   Physical Exam  BP 139/78   Pulse 73   Temp 98.1 F (36.7 C) (Oral)   Resp 16   SpO2 99%  Gen:   Awake, no distress   Resp:  Normal effort  MSK:   Moves extremities without difficulty  Other:    Medical Decision Making  Medically screening exam initiated at 11:35 AM.  Appropriate orders placed.  Eric Harrington was informed that the remainder of the evaluation will be completed by another provider, this initial triage assessment does not replace that evaluation, and the importance of remaining in the ED until their evaluation is complete.  Spoke with my attending physician who recommends repeat labs   Rhae Hammock, PA-C 06/06/22 1135

## 2022-06-06 NOTE — ED Notes (Signed)
Admitting physician at bedside

## 2022-06-06 NOTE — ED Triage Notes (Signed)
Pt came in [place] after getting d/c yesterday from here. He was called by a provider here at the hospital & informed that his blood cultures came back positive & that he should return to hospital for further Tx.

## 2022-06-06 NOTE — Assessment & Plan Note (Signed)
Continue rosuvastatin. 

## 2022-06-07 DIAGNOSIS — R7881 Bacteremia: Secondary | ICD-10-CM | POA: Diagnosis not present

## 2022-06-07 LAB — CBC WITH DIFFERENTIAL/PLATELET
Abs Immature Granulocytes: 0.04 10*3/uL (ref 0.00–0.07)
Basophils Absolute: 0.1 10*3/uL (ref 0.0–0.1)
Basophils Relative: 0 %
Eosinophils Absolute: 0 10*3/uL (ref 0.0–0.5)
Eosinophils Relative: 0 %
HCT: 32.7 % — ABNORMAL LOW (ref 39.0–52.0)
Hemoglobin: 10.1 g/dL — ABNORMAL LOW (ref 13.0–17.0)
Immature Granulocytes: 0 %
Lymphocytes Relative: 92 %
Lymphs Abs: 45.9 10*3/uL — ABNORMAL HIGH (ref 0.7–4.0)
MCH: 31.4 pg (ref 26.0–34.0)
MCHC: 30.9 g/dL (ref 30.0–36.0)
MCV: 101.6 fL — ABNORMAL HIGH (ref 80.0–100.0)
Monocytes Absolute: 1.7 10*3/uL — ABNORMAL HIGH (ref 0.1–1.0)
Monocytes Relative: 3 %
Neutro Abs: 2.3 10*3/uL (ref 1.7–7.7)
Neutrophils Relative %: 5 %
Platelets: 127 10*3/uL — ABNORMAL LOW (ref 150–400)
RBC: 3.22 MIL/uL — ABNORMAL LOW (ref 4.22–5.81)
RDW: 14.1 % (ref 11.5–15.5)
Smear Review: DECREASED
WBC Morphology: ABNORMAL
WBC: 50 10*3/uL — ABNORMAL HIGH (ref 4.0–10.5)
nRBC: 0.1 % (ref 0.0–0.2)

## 2022-06-07 LAB — CBG MONITORING, ED
Glucose-Capillary: 138 mg/dL — ABNORMAL HIGH (ref 70–99)
Glucose-Capillary: 124 mg/dL — ABNORMAL HIGH (ref 70–99)

## 2022-06-07 LAB — BASIC METABOLIC PANEL WITH GFR
Anion gap: 6 (ref 5–15)
BUN: 14 mg/dL (ref 8–23)
CO2: 24 mmol/L (ref 22–32)
Calcium: 8.5 mg/dL — ABNORMAL LOW (ref 8.9–10.3)
Chloride: 105 mmol/L (ref 98–111)
Creatinine, Ser: 1.34 mg/dL — ABNORMAL HIGH (ref 0.61–1.24)
GFR, Estimated: 54 mL/min — ABNORMAL LOW (ref >60)
Glucose, Bld: 134 mg/dL — ABNORMAL HIGH (ref 70–99)
Potassium: 4.6 mmol/L (ref 3.5–5.1)
Sodium: 135 mmol/L (ref 135–145)

## 2022-06-07 LAB — GLUCOSE, CAPILLARY
Glucose-Capillary: 164 mg/dL — ABNORMAL HIGH (ref 70–99)
Glucose-Capillary: 147 mg/dL — ABNORMAL HIGH (ref 70–99)
Glucose-Capillary: 164 mg/dL — ABNORMAL HIGH (ref 70–99)

## 2022-06-07 LAB — APTT: aPTT: 31 s (ref 24–36)

## 2022-06-07 MED ORDER — ADULT MULTIVITAMIN W/MINERALS CH
1.0000 | ORAL_TABLET | Freq: Every day | ORAL | Status: DC
  Administered 2022-06-07 – 2022-06-09 (×3): 1 via ORAL
  Filled 2022-06-07 (×3): qty 1

## 2022-06-07 MED ORDER — GUAIFENESIN ER 600 MG PO TB12
1200.0000 mg | ORAL_TABLET | Freq: Two times a day (BID) | ORAL | Status: DC | PRN
  Administered 2022-06-07 – 2022-06-08 (×2): 1200 mg via ORAL
  Filled 2022-06-07 (×2): qty 2

## 2022-06-07 MED ORDER — ORAL CARE MOUTH RINSE: 15.0000 mL | OROMUCOSAL | Status: DC | PRN

## 2022-06-07 MED ORDER — GUAIFENESIN 100 MG/5ML PO LIQD: 5.0000 mL | ORAL | Status: DC | PRN

## 2022-06-07 NOTE — Progress Notes (Signed)
PROGRESS NOTE  Eric Harrington XVQ:[id number] DOB: [birth year] DOA: 06/06/2022 PCP: [name], MD   LOS: 0 days   Brief Narrative / Interim history: 82 y.o. male with medical history significant for CLL, PAF on Eliquis, T2DM, CKD stage IIIa, HTN, HLD who presented to the ED for evaluation of positive blood culture.  He was recently admitted just few days ago for A-fib with RVR, and apparently blood cultures were obtained at that time for unclear reasons.  Blood cultures were positive for Enterococcus and he was called to come back to the hospital.  Subjective / 24h Interval events: He is doing well, denies any fever or chills.  No dysuria, no abdominal pain, no nausea or vomiting.  Assesement and Plan: Principal Problem:   Positive blood culture (Enterococcus faecalis) Active Problems:   PAF (paroxysmal atrial fibrillation) (HCC)   Type 2 diabetes mellitus with complication, without long-term current use of insulin (HCC)   CLL (chronic lymphocytic leukemia) (HCC)   Chronic kidney disease, stage 3a (HCC)   Hyperlipemia  Principal problem Enterococcus faecalis bacteremia -less likely to be contaminant but not sure.  He came in hypotensive and with A-fib with RVR which may be a red flag.  ID consulted and following, discussed with Dr. West Bali, surveillance blood cultures obtained and will have to wait at least 48-72 hours.  Keep on vancomycin till then and closely monitor for fever/GU/GI symptoms.  Most recent TEE 1/16 without endocarditis  Active problems PAF-currently on amiodarone as well as Eliquis.  Continue.  He is in sinus rhythm  CLL-follows with oncology as an outpatient, this appears overall stable.  CKD 3A-baseline creatinine 1.1-1.4, currently at baseline  Hyperlipidemia-continue statin  DM2 -controlled, continue sliding scale  CBG (last 3)  Recent Labs    06/07/22 0322 06/07/22 0824 06/07/22 1146  GLUCAP 138* 124* 164*    Lab Results  Component Value Date    HGBA1C 7.0 (H) 06/05/2022    Scheduled Meds:  amiodarone  400 mg Oral BID   apixaban  5 mg Oral BID   insulin aspart  0-9 Units Subcutaneous TID WC   multivitamin with minerals  1 tablet Oral Daily   rosuvastatin  5 mg Oral Daily   Continuous Infusions:  vancomycin     PRN Meds:.acetaminophen **OR** acetaminophen, guaiFENesin, ondansetron **OR** ondansetron (ZOFRAN) IV, mouth rinse, senna-docusate  Current Outpatient Medications  Medication Instructions   albuterol (VENTOLIN HFA) 108 (90 Base) MCG/ACT inhaler 1 puff, Inhalation, Every 4 hours PRN   amiodarone (PACERONE) 200 MG tablet Take 2 tablets (400 mg total) by mouth 2 (two) times daily for 7 days, THEN 1 tablet (200 mg total) 2 (two) times daily for 7 days, THEN 1 tablet (200 mg total) daily for 16 days.   carboxymethylcellulose (REFRESH PLUS) 0.5 % SOLN 1 drop, Both Eyes, Daily PRN   cetirizine (ZYRTEC) 10 mg, Oral, Daily PRN   cyanocobalamin (VITAMIN B12) 1,000 mcg, Oral, Daily   docusate sodium (COLACE) 100 mg, Oral, Daily   Eliquis 5 mg, Oral, 2 times daily   esomeprazole (NEXIUM) 20 mg, Oral, Daily   Farxiga 10 mg, Oral, Daily   fluticasone (FLONASE) 50 MCG/ACT nasal spray 1 spray, Each Nare, Daily PRN   glimepiride (AMARYL) 2 mg, Oral, Daily with breakfast   Glucose Blood (IGLUCOSE TEST STRIPS VI) In Vitro   guaiFENesin-codeine 100-10 MG/5ML syrup 10 mLs, Oral, Every 4 hours PRN   Lancets MISC Does not apply   metFORMIN (GLUCOPHAGE) 500 mg, Oral, 2 times  daily   Multiple Vitamins-Minerals (MULTIVITAMIN ADULTS PO) 1 tablet, Oral, Daily, Immunity MV   rosuvastatin (CRESTOR) 5 mg, Oral, Daily   tobramycin (TOBREX) 0.3 % ophthalmic solution 1 drop, Both Eyes, Every 4 hours   vitamin C 1,000 mg, Oral, Daily   VITAMIN D PO 1,000 Units, Oral, Daily   Zinc 50 mg, Oral, Daily    Diet Orders (From admission, onward)     Start     Ordered   06/06/22 2000  Diet heart healthy/carb modified Room service appropriate? Yes;  Fluid consistency: Thin  Diet effective now       Question Answer Comment  Diet-HS Snack? Nothing   Room service appropriate? Yes   Fluid consistency: Thin      06/06/22 1959            DVT prophylaxis:  apixaban (ELIQUIS) tablet 5 mg   Lab Results  Component Value Date   PLT 127 (L) 06/07/2022      Code Status: Full Code  Family Communication: daughter at bedside  Status is: Observation  The patient will require care spanning > 2 midnights and should be moved to inpatient because: Bacteremia requiring IV antibiotics and surveillance cultures   Level of care: Med-Surg  Consultants:  ID  Objective: Vitals:   06/07/22 0827 06/07/22 0906 06/07/22 0906 06/07/22 1036  BP: (!) 147/97  138/70 (!) 153/79  Pulse: 72  73 70  Resp: 14  (!) 24 16  Temp: 97.6 F (36.4 C) (!) 97.5 F (36.4 C)  98.2 F (36.8 C)  TempSrc:  Oral  Oral  SpO2: 100%  99% 98%  Weight:      Height:        Intake/Output Summary (Last 24 hours) at 06/07/2022 1303 Last data filed at 06/06/2022 2223 Gross per 24 hour  Intake 344.17 ml  Output --  Net 344.17 ml   Wt Readings from Last 3 Encounters:  06/07/22 76.8 kg  06/05/22 76.8 kg  05/22/22 78.9 kg    Examination:  Constitutional: NAD Eyes: no scleral icterus ENMT: Mucous membranes are moist.  Neck: normal, supple Respiratory: clear to auscultation bilaterally, no wheezing, no crackles. Normal respiratory effort. No accessory muscle use.  Cardiovascular: Regular rate and rhythm, no murmurs / rubs / gallops. No LE edema.  Abdomen: non distended, no tenderness. Bowel sounds positive.  Musculoskeletal: no clubbing / cyanosis.   Data Reviewed: I have independently reviewed following labs and imaging studies   CBC Recent Labs  Lab 06/04/22 0807 06/05/22 0515 06/06/22 1134 06/07/22 0442  WBC 61.8* 52.0* 39.8* 50.0*  HGB 11.1* 10.8* 11.1* 10.1*  HCT 35.0* 33.2* 36.2* 32.7*  PLT 213 158 143* 127*  MCV 97.8 97.1 102.8* 101.6*   MCH 31.0 31.6 31.5 31.4  MCHC 31.7 32.5 30.7 30.9  RDW 13.9 13.8 14.0 14.1  LYMPHSABS  --  48.9* 35.4* 45.9*  MONOABS  --  0.0* 1.6* 1.7*  EOSABS  --  0.0 0.0 0.0  BASOSABS  --  0.0 0.1 0.1    Recent Labs  Lab 06/04/22 0807 06/05/22 0515 06/05/22 0822 06/06/22 1134 06/06/22 1144 06/07/22 0442  NA 132* 136  --  134*  --  135  K 3.9 4.1  --  4.6  --  4.6  CL 97* 103  --  103  --  105  CO2 26 25  --  21*  --  24  GLUCOSE 148* 161*  --  127*  --  134*  BUN 29*  21  --  14  --  14  CREATININE 1.38* 1.11  --  1.30*  --  1.34*  CALCIUM 8.3* 8.7*  --  8.8*  --  8.5*  AST  --  13*  --  15  --   --   ALT  --  13  --  14  --   --   ALKPHOS  --  82  --  87  --   --   BILITOT  --  0.4  --  0.8  --   --   ALBUMIN  --  3.1*  --  3.4*  --   --   MG  --  2.0  --   --   --   --   PROCALCITON  --  <0.10  --   --   --   --   LATICACIDVEN  --  0.9 0.7  --  1.3  --   TSH  --  0.789  --   --   --   --   HGBA1C  --  7.0*  --   --   --   --     ------------------------------------------------------------------------------------------------------------------ No results for input(s): "CHOL", "HDL", "LDLCALC", "TRIG", "CHOLHDL", "LDLDIRECT" in the last 72 hours.  Lab Results  Component Value Date   HGBA1C 7.0 (H) 06/05/2022   ------------------------------------------------------------------------------------------------------------------ Recent Labs    06/05/22 0515  TSH 0.789    Cardiac Enzymes No results for input(s): "CKMB", "TROPONINI", "MYOGLOBIN" in the last 168 hours.  Invalid input(s): "CK" ------------------------------------------------------------------------------------------------------------------ No results found for: "BNP"  CBG: Recent Labs  Lab 06/04/22 0810 06/05/22 1218 06/07/22 0322 06/07/22 0824 06/07/22 1146  GLUCAP 152* 237* 138* 124* 164*    Recent Results (from the past 240 hour(s))  Resp panel by RT-PCR (RSV, Flu A&B, Covid) Anterior Nasal Swab      Status: None   Collection Time: 06/04/22  8:58 AM   Specimen: Anterior Nasal Swab  Result Value Ref Range Status   SARS Coronavirus 2 by RT PCR NEGATIVE NEGATIVE Final    Comment: (NOTE) SARS-CoV-2 target nucleic acids are NOT DETECTED.  The SARS-CoV-2 RNA is generally detectable in upper respiratory specimens during the acute phase of infection. The lowest concentration of SARS-CoV-2 viral copies this assay can detect is 138 copies/mL. A negative result does not preclude SARS-Cov-2 infection and should not be used as the sole basis for treatment or other patient management decisions. A negative result may occur with  improper specimen collection/handling, submission of specimen other than nasopharyngeal swab, presence of viral mutation(s) within the areas targeted by this assay, and inadequate number of viral copies(<138 copies/mL). A negative result must be combined with clinical observations, patient history, and epidemiological information. The expected result is Negative.  Fact Sheet for Patients:  EntrepreneurPulse.com.au  Fact Sheet for Healthcare Providers:  IncredibleEmployment.be  This test is no t yet approved or cleared by the [place] FDA and  has been authorized for detection and/or diagnosis of SARS-CoV-2 by FDA under an Emergency Use Authorization (EUA). This EUA will remain  in effect (meaning this test can be used) for the duration of the COVID-19 declaration under Section 564(b)(1) of the Act, 21 U.S.C.section 360bbb-3(b)(1), unless the authorization is terminated  or revoked sooner.       Influenza A by PCR NEGATIVE NEGATIVE Final   Influenza B by PCR NEGATIVE NEGATIVE Final    Comment: (NOTE) The Xpert Xpress SARS-CoV-2/FLU/RSV plus assay is intended as an aid  in the diagnosis of influenza from Nasopharyngeal swab specimens and should not be used as a sole basis for treatment. Nasal washings and aspirates are  unacceptable for Xpert Xpress SARS-CoV-2/FLU/RSV testing.  Fact Sheet for Patients: EntrepreneurPulse.com.au  Fact Sheet for Healthcare Providers: IncredibleEmployment.be  This test is not yet approved or cleared by the [place] FDA and has been authorized for detection and/or diagnosis of SARS-CoV-2 by FDA under an Emergency Use Authorization (EUA). This EUA will remain in effect (meaning this test can be used) for the duration of the COVID-19 declaration under Section 564(b)(1) of the Act, 21 U.S.C. section 360bbb-3(b)(1), unless the authorization is terminated or revoked.     Resp Syncytial Virus by PCR NEGATIVE NEGATIVE Final    Comment: (NOTE) Fact Sheet for Patients: EntrepreneurPulse.com.au  Fact Sheet for Healthcare Providers: IncredibleEmployment.be  This test is not yet approved or cleared by the [place] FDA and has been authorized for detection and/or diagnosis of SARS-CoV-2 by FDA under an Emergency Use Authorization (EUA). This EUA will remain in effect (meaning this test can be used) for the duration of the COVID-19 declaration under Section 564(b)(1) of the Act, 21 U.S.C. section 360bbb-3(b)(1), unless the authorization is terminated or revoked.  Performed at [facility], [address] [place], [place] [address]   MRSA Next Gen by PCR, Nasal     Status: None   Collection Time: 06/05/22  3:06 AM   Specimen: Nasal Mucosa; Nasal Swab  Result Value Ref Range Status   MRSA by PCR Next Gen NOT DETECTED NOT DETECTED Final    Comment: (NOTE) The GeneXpert MRSA Assay (FDA approved for NASAL specimens only), is one component of a comprehensive MRSA colonization surveillance program. It is not intended to diagnose MRSA infection nor to guide or monitor treatment for MRSA infections. Test performance is not FDA approved in patients less than 44 years old. Performed at  [facility], [place] [address], [place], [place] [address]   Culture, blood (Routine X 2) w Reflex to ID Panel     Status: None (Preliminary result)   Collection Time: 06/05/22  5:17 AM   Specimen: BLOOD LEFT ARM  Result Value Ref Range Status   Specimen Description BLOOD LEFT ARM  Final   Special Requests   Final    BOTTLES DRAWN AEROBIC AND ANAEROBIC Blood Culture results may not be optimal due to an excessive volume of blood received in culture bottles   Culture   Final    NO GROWTH 2 DAYS Performed at [facility], [place] [address], [place], [place] [address]    Report Status PENDING  Incomplete  Culture, blood (Routine X 2) w Reflex to ID Panel     Status: Abnormal (Preliminary result)   Collection Time: 06/05/22  5:18 AM   Specimen: BLOOD  Result Value Ref Range Status   Specimen Description BLOOD BLOOD LEFT HAND  Final   Special Requests   Final    BOTTLES DRAWN AEROBIC AND ANAEROBIC Blood Culture adequate volume   Culture  Setup Time   Final    GRAM POSITIVE COCCI IN BOTH AEROBIC AND ANAEROBIC BOTTLES CRITICAL RESULT CALLED TO, READ BACK BY AND VERIFIED WITH: Barbaraann Boys RN 06/06/22 @ 0610 BY AB    Culture (A)  Final    ENTEROCOCCUS FAECALIS SUSCEPTIBILITIES TO FOLLOW Performed at [facility], [place] [address] [place], [place] [address]    Report Status PENDING  Incomplete  Blood  Culture ID Panel (Reflexed)     Status: Abnormal   Collection Time: 06/05/22  5:18 AM  Result Value Ref Range Status   Enterococcus faecalis DETECTED (A) NOT DETECTED Final    Comment: CRITICAL RESULT CALLED TO, READ BACK BY AND VERIFIED WITH: Barbaraann Boys RN 06/06/22 @ 0610 BY AB    Enterococcus Faecium NOT DETECTED NOT DETECTED Final   Listeria monocytogenes NOT DETECTED NOT DETECTED Final   Staphylococcus species NOT DETECTED NOT DETECTED Final   Staphylococcus aureus (BCID) NOT DETECTED NOT DETECTED Final   Staphylococcus epidermidis NOT DETECTED NOT DETECTED Final    Staphylococcus lugdunensis NOT DETECTED NOT DETECTED Final   Streptococcus species NOT DETECTED NOT DETECTED Final   Streptococcus agalactiae NOT DETECTED NOT DETECTED Final   Streptococcus pneumoniae NOT DETECTED NOT DETECTED Final   Streptococcus pyogenes NOT DETECTED NOT DETECTED Final   A.calcoaceticus-baumannii NOT DETECTED NOT DETECTED Final   Bacteroides fragilis NOT DETECTED NOT DETECTED Final   Enterobacterales NOT DETECTED NOT DETECTED Final   Enterobacter cloacae complex NOT DETECTED NOT DETECTED Final   Escherichia coli NOT DETECTED NOT DETECTED Final   Klebsiella aerogenes NOT DETECTED NOT DETECTED Final   Klebsiella oxytoca NOT DETECTED NOT DETECTED Final   Klebsiella pneumoniae NOT DETECTED NOT DETECTED Final   Proteus species NOT DETECTED NOT DETECTED Final   Salmonella species NOT DETECTED NOT DETECTED Final   Serratia marcescens NOT DETECTED NOT DETECTED Final   Haemophilus influenzae NOT DETECTED NOT DETECTED Final   Neisseria meningitidis NOT DETECTED NOT DETECTED Final   Pseudomonas aeruginosa NOT DETECTED NOT DETECTED Final   Stenotrophomonas maltophilia NOT DETECTED NOT DETECTED Final   Candida albicans NOT DETECTED NOT DETECTED Final   Candida auris NOT DETECTED NOT DETECTED Final   Candida glabrata NOT DETECTED NOT DETECTED Final   Candida krusei NOT DETECTED NOT DETECTED Final   Candida parapsilosis NOT DETECTED NOT DETECTED Final   Candida tropicalis NOT DETECTED NOT DETECTED Final   Cryptococcus neoformans/gattii NOT DETECTED NOT DETECTED Final   Vancomycin resistance NOT DETECTED NOT DETECTED Final    Comment: Performed at [facility], [address], [place], [place] [address]  Blood culture (routine x 2)     Status: None (Preliminary result)   Collection Time: 06/06/22 11:28 AM   Specimen: BLOOD LEFT HAND  Result Value Ref Range Status   Specimen Description BLOOD LEFT HAND  Final   Special Requests   Final    BOTTLES DRAWN AEROBIC AND  ANAEROBIC Blood Culture results may not be optimal due to an inadequate volume of blood received in culture bottles   Culture   Final    NO GROWTH < 24 HOURS Performed at [facility], [place] [address], [place], [place] [address]    Report Status PENDING  Incomplete     Radiology Studies: No results found.   Marzetta Board, MD, PhD [facility]  Between 7 am - 7 pm I am available, please contact me via Amion (for emergencies) or Securechat (non urgent messages)  Between 7 pm - 7 am I am not available, please contact night coverage MD/APP via Amion

## 2022-06-07 NOTE — ED Notes (Signed)
ED TO INPATIENT HANDOFF REPORT  ED Nurse Name and Phone #: Suella Grove [phone]  S Name/Age/Gender Eric Harrington 82 y.o. male Room/Bed: 038C/038C  Code Status   Code Status: Full Code  Home/SNF/Other Home Patient oriented to: self, place, time, and situation Is this baseline? Yes   Triage Complete: Triage complete  Chief Complaint Positive blood culture [R78.81]  Triage Note Pt came in POV after getting d/c yesterday from here. He was called by a provider here at the hospital & informed that his blood cultures came back positive & that he should return to hospital for further Tx.    Allergies Allergies  Allergen Reactions   Amlodipine Besylate Rash    Fatigue, chills, fever   Bystolic [Nebivolol Hcl]    Cephalexin    Doxycycline     Fever/chills   Gemfibrozil    Glipizide    Januvia [Sitagliptin]    Lipitor [Atorvastatin Calcium]    Lisinopril Other (See Comments)    Pt states lisinopril causes severe constipation    Losartan    Metoprolol Other (See Comments)    weakness   Omeprazole    Simvastatin    Gabapentin     Level of Care/Admitting Diagnosis ED Disposition     ED Disposition  Admit   Condition  --   Comment  Hospital Area: [facility] [[id number]]  Level of Care: Med-Surg [16]  May place patient in observation at [facility] or [place] if equivalent level of care is available:: No  Covid Evaluation: Asymptomatic - no recent exposure (last 10 days) testing not required  Diagnosis: Positive blood culture [038882]  Admitting Physician: Lenore Cordia [[id number]]  Attending Physician: Lenore Cordia [[id number]]          B Medical/Surgery History Past Medical History:  Diagnosis Date   Chronic Leukemia    Diabetes mellitus without complication ([place])    Hyperlipemia 12/02/2018   Hyperlipidemia    Hypertension    Type 2 diabetes mellitus with complication, without long-term current use of insulin ([place]) 12/02/2018   Past  Surgical History:  Procedure Laterality Date   CARDIAC CATHETERIZATION  2017     A IV Location/Drains/Wounds Patient Lines/Drains/Airways Status     Active Line/Drains/Airways     Name Placement date Placement time Site Days   Peripheral IV 06/06/22 20 G Anterior;Right Forearm 06/06/22  1859  Forearm  1            Intake/Output Last 24 hours  Intake/Output Summary (Last 24 hours) at 06/07/2022 0907 Last data filed at 06/06/2022 2223 Gross per 24 hour  Intake 344.17 ml  Output --  Net 344.17 ml    Labs/Imaging Results for orders placed or performed during the hospital encounter of 06/06/22 (from the past 48 hour(s))  Comprehensive metabolic panel     Status: Abnormal   Collection Time: 06/06/22 11:34 AM  Result Value Ref Range   Sodium 134 (L) 135 - 145 mmol/L   Potassium 4.6 3.5 - 5.1 mmol/L   Chloride 103 98 - 111 mmol/L   CO2 21 (L) 22 - 32 mmol/L   Glucose, Bld 127 (H) 70 - 99 mg/dL    Comment: Glucose reference range applies only to samples taken after fasting for at least 8 hours.   BUN 14 8 - 23 mg/dL   Creatinine, Ser 1.30 (H) 0.61 - 1.24 mg/dL   Calcium 8.8 (L) 8.9 - 10.3 mg/dL   Total Protein 5.9 (L) 6.5 -  8.1 g/dL   Albumin 3.4 (L) 3.5 - 5.0 g/dL   AST 15 15 - 41 U/L   ALT 14 0 - 44 U/L   Alkaline Phosphatase 87 38 - 126 U/L   Total Bilirubin 0.8 0.3 - 1.2 mg/dL   GFR, Estimated 56 (L) >60 mL/min    Comment: (NOTE) Calculated using the CKD-EPI Creatinine Equation (2021)    Anion gap 10 5 - 15    Comment: Performed at [facility] [address] [place], [place] [address]  CBC with Differential     Status: Abnormal   Collection Time: 06/06/22 11:34 AM  Result Value Ref Range   WBC 39.8 (H) 4.0 - 10.5 K/uL   RBC 3.52 (L) 4.22 - 5.81 MIL/uL   Hemoglobin 11.1 (L) 13.0 - 17.0 g/dL   HCT 36.2 (L) 39.0 - 52.0 %   MCV 102.8 (H) 80.0 - 100.0 fL   MCH 31.5 26.0 - 34.0 pg   MCHC 30.7 30.0 - 36.0 g/dL   RDW 14.0 11.5 - 15.5 %   Platelets 143 (L)  150 - 400 K/uL   nRBC 0.1 0.0 - 0.2 %   Neutrophils Relative % 7 %   Neutro Abs 2.7 1.7 - 7.7 K/uL   Lymphocytes Relative 89 %   Lymphs Abs 35.4 (H) 0.7 - 4.0 K/uL   Monocytes Relative 4 %   Monocytes Absolute 1.6 (H) 0.1 - 1.0 K/uL   Eosinophils Relative 0 %   Eosinophils Absolute 0.0 0.0 - 0.5 K/uL   Basophils Relative 0 %   Basophils Absolute 0.1 0.0 - 0.1 K/uL   WBC Morphology See Note     Comment: Absolute Lymphocytosis  Abnormal Lymphocytes Present  Smudge Cells    Immature Granulocytes 0 %   Abs Immature Granulocytes 0.08 (H) 0.00 - 0.07 K/uL    Comment: Performed at [facility], [place] [address] [place], [place] [address]  Lactic acid, plasma     Status: None   Collection Time: 06/06/22 11:44 AM  Result Value Ref Range   Lactic Acid, Venous 1.3 0.5 - 1.9 mmol/L    Comment: Performed at [facility] [address] [place], [place] [address]  CBG monitoring, ED     Status: Abnormal   Collection Time: 06/07/22  3:22 AM  Result Value Ref Range   Glucose-Capillary 138 (H) 70 - 99 mg/dL    Comment: Glucose reference range applies only to samples taken after fasting for at least 8 hours.  Basic metabolic panel     Status: Abnormal   Collection Time: 06/07/22  4:42 AM  Result Value Ref Range   Sodium 135 135 - 145 mmol/L   Potassium 4.6 3.5 - 5.1 mmol/L   Chloride 105 98 - 111 mmol/L   CO2 24 22 - 32 mmol/L   Glucose, Bld 134 (H) 70 - 99 mg/dL    Comment: Glucose reference range applies only to samples taken after fasting for at least 8 hours.   BUN 14 8 - 23 mg/dL   Creatinine, Ser 1.34 (H) 0.61 - 1.24 mg/dL   Calcium 8.5 (L) 8.9 - 10.3 mg/dL   GFR, Estimated 54 (L) >60 mL/min    Comment: (NOTE) Calculated using the CKD-EPI Creatinine Equation (2021)    Anion gap 6 5 - 15    Comment: Performed at [facility] [address] [place], [place] [address]  APTT     Status: None  Collection Time: 06/07/22  4:42 AM  Result Value Ref Range   aPTT 31  24 - 36 seconds    Comment: Performed at [facility] [address] [place], [place] [address]  CBC with Differential/Platelet     Status: Abnormal   Collection Time: 06/07/22  4:42 AM  Result Value Ref Range   WBC 50.0 (H) 4.0 - 10.5 K/uL   RBC 3.22 (L) 4.22 - 5.81 MIL/uL   Hemoglobin 10.1 (L) 13.0 - 17.0 g/dL   HCT 32.7 (L) 39.0 - 52.0 %   MCV 101.6 (H) 80.0 - 100.0 fL   MCH 31.4 26.0 - 34.0 pg   MCHC 30.9 30.0 - 36.0 g/dL   RDW 14.1 11.5 - 15.5 %   Platelets 127 (L) 150 - 400 K/uL   nRBC 0.1 0.0 - 0.2 %   Neutrophils Relative % 5 %   Neutro Abs 2.3 1.7 - 7.7 K/uL   Lymphocytes Relative 92 %   Lymphs Abs 45.9 (H) 0.7 - 4.0 K/uL   Monocytes Relative 3 %   Monocytes Absolute 1.7 (H) 0.1 - 1.0 K/uL   Eosinophils Relative 0 %   Eosinophils Absolute 0.0 0.0 - 0.5 K/uL   Basophils Relative 0 %   Basophils Absolute 0.1 0.0 - 0.1 K/uL   WBC Morphology Abnormal lymphocytes present     Comment: ABSOLUTE LYMPHOCYTOSIS SMUDGE CELLS See Note    Smear Review PLATELETS APPEAR DECREASED    Immature Granulocytes 0 %   Abs Immature Granulocytes 0.04 0.00 - 0.07 K/uL   Tear Drop Cells PRESENT     Comment: Performed at [facility], [place] [address], [place], [place] [address]  CBG monitoring, ED     Status: Abnormal   Collection Time: 06/07/22  8:24 AM  Result Value Ref Range   Glucose-Capillary 124 (H) 70 - 99 mg/dL    Comment: Glucose reference range applies only to samples taken after fasting for at least 8 hours.   Comment 1 Notify RN    Comment 2 Document in Chart    ECHOCARDIOGRAM COMPLETE  Result Date: 06/05/2022    ECHOCARDIOGRAM REPORT   Patient Name:   Eric Harrington Date of Exam: 06/05/2022 Medical Rec #:  [id number]     Height:       70.0 in Accession #:    [id number]    Weight:       169.4 lb Date of Birth:  1941-02-21     BSA:          1.945 m Patient Age:    47 years      BP:           160/77 mmHg Patient Gender: M             HR:           72 bpm. Exam Location:   Inpatient Procedure: 2D Echo, Cardiac Doppler and Color Doppler Indications:    I48.91* Unspeicified atrial fibrillation  History:        Patient has no prior history of Echocardiogram examinations.                 Risk Factors:Diabetes, Dyslipidemia and Hypertension.  Sonographer:    Phineas Douglas Referring Phys: [id number] [place]  1. Left ventricular ejection fraction, by estimation, is 60 to 65%. The left ventricle has normal function. The left ventricle has no regional wall motion abnormalities. Left ventricular diastolic parameters are indeterminate.  2. Right ventricular systolic function is normal. The right ventricular size is normal. There is normal pulmonary artery systolic pressure. The estimated right ventricular systolic pressure is 62.1 mmHg.  3. The mitral valve is grossly normal. Trivial mitral valve regurgitation. No evidence of mitral stenosis.  4. The aortic valve is tricuspid. Aortic valve regurgitation is trivial. Aortic valve sclerosis is present, with no evidence of aortic valve stenosis.  5. Aortic dilatation noted. There is borderline dilatation of the aortic root, measuring 38 mm.  6. The inferior vena cava is dilated in size with >50% respiratory variability, suggesting right atrial pressure of 8 mmHg. Comparison(s): No prior Echocardiogram. FINDINGS  Left Ventricle: Left ventricular ejection fraction, by estimation, is 60 to 65%. The left ventricle has normal function. The left ventricle has no regional wall motion abnormalities. The left ventricular internal cavity size was normal in size. There is  no left ventricular hypertrophy. Left ventricular diastolic parameters are indeterminate. Right Ventricle: The right ventricular size is normal. No increase in right ventricular wall thickness. Right ventricular systolic function is normal. There is normal pulmonary artery systolic pressure. The tricuspid regurgitant velocity is 2.53 m/s, and  with an assumed right atrial  pressure of 8 mmHg, the estimated right ventricular systolic pressure is 30.8 mmHg. Left Atrium: Left atrial size was normal in size. Right Atrium: Right atrial size was normal in size. Pericardium: There is no evidence of pericardial effusion. Mitral Valve: The mitral valve is grossly normal. There is mild thickening of the mitral valve leaflet(s). There is mild calcification of the mitral valve leaflet(s). Trivial mitral valve regurgitation. No evidence of mitral valve stenosis. Tricuspid Valve: The tricuspid valve is normal in structure. Tricuspid valve regurgitation is trivial. Aortic Valve: The aortic valve is tricuspid. Aortic valve regurgitation is trivial. Aortic valve sclerosis is present, with no evidence of aortic valve stenosis. Pulmonic Valve: The pulmonic valve was not well visualized. Pulmonic valve regurgitation is trivial. Aorta: Aortic dilatation noted. There is borderline dilatation of the aortic root, measuring 38 mm. Venous: The inferior vena cava is dilated in size with greater than 50% respiratory variability, suggesting right atrial pressure of 8 mmHg. IAS/Shunts: The atrial septum is grossly normal.  LEFT VENTRICLE PLAX 2D LVIDd:         4.70 cm      Diastology LVIDs:         3.10 cm      LV e' medial:    5.55 cm/s LV PW:         1.30 cm      LV E/e' medial:  15.9 LV IVS:        1.00 cm      LV e' lateral:   7.07 cm/s LVOT diam:     2.20 cm      LV E/e' lateral: 12.4 LV SV:         84 LV SV Index:   43 LVOT Area:     3.80 cm  LV Volumes (MOD) LV vol d, MOD A2C: 111.0 ml LV vol d, MOD A4C: 103.0 ml LV vol s, MOD A2C: 48.0 ml LV vol s, MOD A4C: 38.7 ml LV SV MOD A2C:     63.0 ml LV SV MOD A4C:     103.0 ml LV SV MOD BP:      68.6 ml RIGHT VENTRICLE             IVC RV Basal diam:  3.50 cm     IVC diam:  2.10 cm RV S prime:     17.00 cm/s TAPSE (M-mode): 2.1 cm LEFT ATRIUM             Index        RIGHT ATRIUM           Index LA diam:        3.60 cm 1.85 cm/m   RA Area:     13.90 cm LA Vol  (A2C):   53.9 ml 27.71 ml/m  RA Volume:   27.20 ml  13.98 ml/m LA Vol (A4C):   47.2 ml 24.27 ml/m LA Biplane Vol: 53.2 ml 27.35 ml/m  AORTIC VALVE LVOT Vmax:   95.40 cm/s LVOT Vmean:  57.600 cm/s LVOT VTI:    0.222 m  AORTA Ao Root diam: 3.80 cm Ao Asc diam:  3.20 cm MITRAL VALVE               TRICUSPID VALVE MV Area (PHT): 3.27 cm    TR Peak grad:   25.6 mmHg MV Decel Time: 232 msec    TR Vmax:        253.00 cm/s MV E velocity: 88.00 cm/s MV A velocity: 79.10 cm/s  SHUNTS MV E/A ratio:  1.11        Systemic VTI:  0.22 m                            Systemic Diam: 2.20 cm Gwyndolyn Kaufman MD Electronically signed by Gwyndolyn Kaufman MD Signature Date/Time: 06/05/2022/11:56:43 AM    Final     Pending Labs Unresulted Labs (From admission, onward)     Start     Ordered   06/06/22 1128  Blood culture (routine x 2)  BLOOD CULTURE X 2,   R (with STAT occurrences),   Status:  Canceled      06/06/22 1127            Vitals/Pain Today's Vitals   06/07/22 0827 06/07/22 0904 06/07/22 0906 06/07/22 0906  BP: (!) 147/97   138/70  Pulse: 72   73  Resp: 14   (!) 24  Temp: 97.6 F (36.4 C)  (!) 97.5 F (36.4 C)   TempSrc:   Oral   SpO2: 100%   99%  Weight:      Height:      PainSc:  0-No pain      Isolation Precautions No active isolations  Medications Medications  vancomycin (VANCOREADY) IVPB 750 mg/150 mL (has no administration in time range)  acetaminophen (TYLENOL) tablet 650 mg (has no administration in time range)    Or  acetaminophen (TYLENOL) suppository 650 mg (has no administration in time range)  ondansetron (ZOFRAN) tablet 4 mg (has no administration in time range)    Or  ondansetron (ZOFRAN) injection 4 mg (has no administration in time range)  senna-docusate (Senokot-S) tablet 1 tablet (has no administration in time range)  insulin aspart (novoLOG) injection 0-9 Units (1 Units Subcutaneous Given 06/07/22 0903)  amiodarone (PACERONE) tablet 400 mg (400 mg Oral Given  06/07/22 0900)  apixaban (ELIQUIS) tablet 5 mg (5 mg Oral Given 06/07/22 0901)  rosuvastatin (CRESTOR) tablet 5 mg (5 mg Oral Given 06/07/22 0900)  vancomycin (VANCOREADY) IVPB 1750 mg/350 mL (0 mg Intravenous Stopped 06/06/22 2223)    Mobility walks     Focused Assessments   Abnormal lab   R Recommendations: See Admitting Provider Note  Report given to:   Additional Notes:  pt left yesterday was call back to come in because of abnormal blood culture. He is on antibiotic.

## 2022-06-07 NOTE — Consult Note (Signed)
[facility] for Infectious Diseases                                                                                        Patient Identification: Patient Name: Eric Harrington MRN: [id number] [place] Date: 06/06/2022 11:07 AM Today's Date: 06/07/2022 Reason for consult: bacteremia  Requesting provider: Tivis Ringer auto consult   Principal Problem:   Positive blood culture (Enterococcus faecalis) Active Problems:   Type 2 diabetes mellitus with complication, without long-term current use of insulin (HCC)   Hyperlipemia   PAF (paroxysmal atrial fibrillation) (HCC)   CLL (chronic lymphocytic leukemia) (HCC)   Chronic kidney disease, stage 3a (HCC)   Antibiotics:  Vancomycin 1/17-c  Lines/Hardware:  Assessment 82 year old male with PMH as below including CLL followed by oncology Dr.kale, HLD, type II DM with polyneuropathy, pAF, CKD, IBS, GERD, presyncopal who was called back to the ED due to blood cultures growing E faecalis 1/16.  Patient was recently discharged on 1/16 when he presented with A-fib with RVR with low BP. Recently completed a course of Zpack as well as prednisone course by PCP.   # Low grade E faecalis bacteremia - no obvious GI and GU symptoms. 1/15 UA unremarkable . Liver enzymes WNL No known hardware Patient presented with A fib w RVR as well as low BP in last admission which could be one of the presentation of bacteremia  1/16 TTE with mild thickening of the MV leaflet as well as mild calcification but no vegetations  Repeat blood cx 1/17 NG in less than 24 hrs   Recommendations  Continue Vancomycin, pharmacy to dose Fu repeat blood cx, would wait for at least 72 hrs to determine if any growth as well as need for TEE given no clear source  Monitor CBC, BMP and  Vancomycin trough  D/w primary  Following   Rest of the management as per the primary team. Please call with questions or concerns.  Thank  you for the consult  Rosiland Oz, MD Infectious Disease Physician [facility] Infectious Disease [address] [place], [place] [address] Phone: [phone]  Fax: [phone]  __________________________________________________________________________________________________________ HPI and Hospital Course: 82 year old male with PMH as below including CLL followed by oncology Dr.kale, HLD, type II DM with polyneuropathy, pAF, CKD, IBS, GERD, presyncopal who was called back to the ED due to blood cultures growing E faecalis 1/15.  Patient was recently discharged on 1/16 when he presented with A-fib with RVR.  Was started on amiodarone drip and heparin drip.  Evaluated by cardiology.  TTE 1/16 with no vegetation/endocarditis.  Chest x-ray 1/15 negative.  1/15 influenza A/B, RSV and SARS-CoV-2 negative. Patient was also recently treated with azithromycin for ? Sinus infection and prednisone outpatient. He converted back to sinus rhythm and was discharged on amiodarone load and apixaban   At ED, afebrile Labs remarkable for lactic acid 1.3, WBC 39.8 in the setting of CLL 1/16 blood cx 1/2 sets GPC in clusters, BCID as E faecalis   He had COVID 2 years ago and since then has started dry cough.  He completed a course of  Z-Pak 10 days ago prescribed by PCP, concern for sinus infection.  He completed a course of prednisone 6 days ago.  Seen by PCP last Friday and was told to be okay but over the weekend, he was feeling more weak and blood pressure was low.  Hence he came to the [facility] ED and then transferred to [facility] for cardiology evaluation due to A-fib with RVR.  Denies prior fever, chills and sweats.  Denies chest pain or shortness of breath.  Denies nausea vomiting abdominal pain or diarrhea.  Denies GU symptoms.  Denies rashes or joint pain.  Denies any known hardware.  ROS: General- Denies fever, chills, loss of appetite and loss of weight HEENT - Denies  headache, blurry vision, neck pain, sinus pain Chest - Denies any chest pain, SOB; dry cough + CVS- Denies any dizziness/lightheadedness, syncopal attacks Abdomen- Denies any nausea, vomiting, abdominal pain, hematochezia and diarrhea Neuro - Denies any weakness, numbness, tingling sensation Psych - Denies any changes in mood irritability or depressive symptoms GU- Denies any burning, dysuria, hematuria or increased frequency of urination Skin - denies any rashes/lesions MSK - denies any joint pain/swelling or restricted ROM   Past Medical History:  Diagnosis Date   Chronic Leukemia    Diabetes mellitus without complication ([place])    Hyperlipemia 12/02/2018   Hyperlipidemia    Hypertension    Type 2 diabetes mellitus with complication, without long-term current use of insulin ([place]) 12/02/2018   Past Surgical History:  Procedure Laterality Date   CARDIAC CATHETERIZATION  2017   Scheduled Meds:  amiodarone  400 mg Oral BID   apixaban  5 mg Oral BID   insulin aspart  0-9 Units Subcutaneous TID WC   rosuvastatin  5 mg Oral Daily   Continuous Infusions:  vancomycin     PRN Meds:.acetaminophen **OR** acetaminophen, ondansetron **OR** ondansetron (ZOFRAN) IV, senna-docusate  Allergies  Allergen Reactions   Amlodipine Besylate Rash    Fatigue, chills, fever   Bystolic [Nebivolol Hcl]    Cephalexin    Doxycycline     Fever/chills   Gemfibrozil    Glipizide    Januvia [Sitagliptin]    Lipitor [Atorvastatin Calcium]    Lisinopril Other (See Comments)    Pt states lisinopril causes severe constipation    Losartan    Metoprolol Other (See Comments)    weakness   Omeprazole    Simvastatin    Gabapentin    Social History   Socioeconomic History   Marital status: Married    Spouse name: Not on file   Number of children: 2   Years of education: Not on file   Highest education level: Not on file  Occupational History   Not on file  Tobacco Use   Smoking status: Former     Packs/day: 1.00    Types: Cigarettes    Quit date: 1980    Years since quitting: 44.0   Smokeless tobacco: Never   Tobacco comments:    started at age 70  Vaping Use   Vaping Use: Never used  Substance and Sexual Activity   Alcohol use: Never   Drug use: Not Currently   Sexual activity: Not Currently    Birth control/protection: None  Other Topics Concern   Not on file  Social History Narrative   Not on file   Social Determinants of Health   Financial Resource Strain: Not on file  Food Insecurity: No Food Insecurity (06/05/2022)   Hunger Vital Sign  Worried About Charity fundraiser in the Last Year: Never true    [place] in the Last Year: Never true  Transportation Needs: No Transportation Needs (06/05/2022)   PRAPARE - Hydrologist (Medical): No    Lack of Transportation (Non-Medical): No  Physical Activity: Not on file  Stress: Not on file  Social Connections: Not on file  Intimate Partner Violence: Not At Risk (06/05/2022)   Humiliation, Afraid, Rape, and Kick questionnaire    Fear of Current or Ex-Partner: No    Emotionally Abused: No    Physically Abused: No    Sexually Abused: No   Family History  Problem Relation Age of Onset   Lung cancer Mother    Heart attack Father    Dementia Sister    Diabetes Brother    Diabetes Sister    Heart attack Brother     Vitals BP 130/70   Pulse 83   Temp 98.2 F (36.8 C) (Oral)   Resp 16   Ht '5\' 10"'$  (1.778 m)   Wt 76.8 kg   SpO2 100%   BMI 24.29 kg/m    Physical Exam Constitutional:  elderly male lying in the bed and appears stated age, hard of hearing     Comments: appears comfortable   Cardiovascular:     Rate and Rhythm: Normal rate and Irregular rhythm.     Heart sounds: s1s2,   Pulmonary:     Effort: Pulmonary effort is normal on room air    Comments: Normal breath sounds   Abdominal:     Palpations: Abdomen is soft.     Tenderness: non distended and non  tender   Musculoskeletal:        General: No swelling or tenderness in peripheral joints   Skin:    Comments: No rashes   Neurological:     General: awake, alert and oriented, grossly non focal   Psychiatric:        Mood and Affect: Mood normal.    Pertinent Microbiology Results for orders placed or performed during the hospital encounter of 06/04/22  Resp panel by RT-PCR (RSV, Flu A&B, Covid) Anterior Nasal Swab     Status: None   Collection Time: 06/04/22  8:58 AM   Specimen: Anterior Nasal Swab  Result Value Ref Range Status   SARS Coronavirus 2 by RT PCR NEGATIVE NEGATIVE Final    Comment: (NOTE) SARS-CoV-2 target nucleic acids are NOT DETECTED.  The SARS-CoV-2 RNA is generally detectable in upper respiratory specimens during the acute phase of infection. The lowest concentration of SARS-CoV-2 viral copies this assay can detect is 138 copies/mL. A negative result does not preclude SARS-Cov-2 infection and should not be used as the sole basis for treatment or other patient management decisions. A negative result may occur with  improper specimen collection/handling, submission of specimen other than nasopharyngeal swab, presence of viral mutation(s) within the areas targeted by this assay, and inadequate number of viral copies(<138 copies/mL). A negative result must be combined with clinical observations, patient history, and epidemiological information. The expected result is Negative.  Fact Sheet for Patients:  EntrepreneurPulse.com.au  Fact Sheet for Healthcare Providers:  IncredibleEmployment.be  This test is no t yet approved or cleared by the [place] FDA and  has been authorized for detection and/or diagnosis of SARS-CoV-2 by FDA under an Emergency Use Authorization (EUA). This EUA will remain  in effect (meaning this test can be used) for the  duration of the COVID-19 declaration under Section 564(b)(1) of the Act,  21 U.S.C.section 360bbb-3(b)(1), unless the authorization is terminated  or revoked sooner.       Influenza A by PCR NEGATIVE NEGATIVE Final   Influenza B by PCR NEGATIVE NEGATIVE Final    Comment: (NOTE) The Xpert Xpress SARS-CoV-2/FLU/RSV plus assay is intended as an aid in the diagnosis of influenza from Nasopharyngeal swab specimens and should not be used as a sole basis for treatment. Nasal washings and aspirates are unacceptable for Xpert Xpress SARS-CoV-2/FLU/RSV testing.  Fact Sheet for Patients: EntrepreneurPulse.com.au  Fact Sheet for Healthcare Providers: IncredibleEmployment.be  This test is not yet approved or cleared by the [place] FDA and has been authorized for detection and/or diagnosis of SARS-CoV-2 by FDA under an Emergency Use Authorization (EUA). This EUA will remain in effect (meaning this test can be used) for the duration of the COVID-19 declaration under Section 564(b)(1) of the Act, 21 U.S.C. section 360bbb-3(b)(1), unless the authorization is terminated or revoked.     Resp Syncytial Virus by PCR NEGATIVE NEGATIVE Final    Comment: (NOTE) Fact Sheet for Patients: EntrepreneurPulse.com.au  Fact Sheet for Healthcare Providers: IncredibleEmployment.be  This test is not yet approved or cleared by the [place] FDA and has been authorized for detection and/or diagnosis of SARS-CoV-2 by FDA under an Emergency Use Authorization (EUA). This EUA will remain in effect (meaning this test can be used) for the duration of the COVID-19 declaration under Section 564(b)(1) of the Act, 21 U.S.C. section 360bbb-3(b)(1), unless the authorization is terminated or revoked.  Performed at [facility], [address] [place], [place] [address]   MRSA Next Gen by PCR, Nasal     Status: None   Collection Time: 06/05/22  3:06 AM   Specimen: Nasal Mucosa; Nasal Swab  Result  Value Ref Range Status   MRSA by PCR Next Gen NOT DETECTED NOT DETECTED Final    Comment: (NOTE) The GeneXpert MRSA Assay (FDA approved for NASAL specimens only), is one component of a comprehensive MRSA colonization surveillance program. It is not intended to diagnose MRSA infection nor to guide or monitor treatment for MRSA infections. Test performance is not FDA approved in patients less than 39 years old. Performed at [facility], [place] [address] [place], [place] [address]   Culture, blood (Routine X 2) w Reflex to ID Panel     Status: None (Preliminary result)   Collection Time: 06/05/22  5:17 AM   Specimen: BLOOD LEFT ARM  Result Value Ref Range Status   Specimen Description BLOOD LEFT ARM  Final   Special Requests   Final    BOTTLES DRAWN AEROBIC AND ANAEROBIC Blood Culture results may not be optimal due to an excessive volume of blood received in culture bottles   Culture   Final    NO GROWTH 1 DAY Performed at [facility], [place] [address], [place], [place] [address]    Report Status PENDING  Incomplete  Culture, blood (Routine X 2) w Reflex to ID Panel     Status: None (Preliminary result)   Collection Time: 06/05/22  5:18 AM   Specimen: BLOOD  Result Value Ref Range Status   Specimen Description BLOOD BLOOD LEFT HAND  Final   Special Requests   Final    BOTTLES DRAWN AEROBIC AND ANAEROBIC Blood Culture adequate volume   Culture  Setup Time   Final    GRAM POSITIVE COCCI IN  BOTH AEROBIC AND ANAEROBIC BOTTLES CRITICAL RESULT CALLED TO, READ BACK BY AND VERIFIED WITH: Barbaraann Boys RN 06/06/22 @ 0610 BY AB Performed at [facility], [place] [address] [place], [place] [address]    Culture GRAM POSITIVE COCCI  Final   Report Status PENDING  Incomplete  Blood Culture ID Panel (Reflexed)     Status: Abnormal   Collection Time: 06/05/22  5:18 AM  Result Value Ref Range Status   Enterococcus faecalis DETECTED (A) NOT DETECTED Final    Comment: CRITICAL  RESULT CALLED TO, READ BACK BY AND VERIFIED WITH: Barbaraann Boys RN 06/06/22 @ 0610 BY AB    Enterococcus Faecium NOT DETECTED NOT DETECTED Final   Listeria monocytogenes NOT DETECTED NOT DETECTED Final   Staphylococcus species NOT DETECTED NOT DETECTED Final   Staphylococcus aureus (BCID) NOT DETECTED NOT DETECTED Final   Staphylococcus epidermidis NOT DETECTED NOT DETECTED Final   Staphylococcus lugdunensis NOT DETECTED NOT DETECTED Final   Streptococcus species NOT DETECTED NOT DETECTED Final   Streptococcus agalactiae NOT DETECTED NOT DETECTED Final   Streptococcus pneumoniae NOT DETECTED NOT DETECTED Final   Streptococcus pyogenes NOT DETECTED NOT DETECTED Final   A.calcoaceticus-baumannii NOT DETECTED NOT DETECTED Final   Bacteroides fragilis NOT DETECTED NOT DETECTED Final   Enterobacterales NOT DETECTED NOT DETECTED Final   Enterobacter cloacae complex NOT DETECTED NOT DETECTED Final   Escherichia coli NOT DETECTED NOT DETECTED Final   Klebsiella aerogenes NOT DETECTED NOT DETECTED Final   Klebsiella oxytoca NOT DETECTED NOT DETECTED Final   Klebsiella pneumoniae NOT DETECTED NOT DETECTED Final   Proteus species NOT DETECTED NOT DETECTED Final   Salmonella species NOT DETECTED NOT DETECTED Final   Serratia marcescens NOT DETECTED NOT DETECTED Final   Haemophilus influenzae NOT DETECTED NOT DETECTED Final   Neisseria meningitidis NOT DETECTED NOT DETECTED Final   Pseudomonas aeruginosa NOT DETECTED NOT DETECTED Final   Stenotrophomonas maltophilia NOT DETECTED NOT DETECTED Final   Candida albicans NOT DETECTED NOT DETECTED Final   Candida auris NOT DETECTED NOT DETECTED Final   Candida glabrata NOT DETECTED NOT DETECTED Final   Candida krusei NOT DETECTED NOT DETECTED Final   Candida parapsilosis NOT DETECTED NOT DETECTED Final   Candida tropicalis NOT DETECTED NOT DETECTED Final   Cryptococcus neoformans/gattii NOT DETECTED NOT DETECTED Final   Vancomycin resistance NOT  DETECTED NOT DETECTED Final    Comment: Performed at [facility], [address] [place], [place] [address]    Pertinent Lab seen by me:    Latest Ref Rng & Units 06/07/2022    4:42 AM 06/06/2022   11:34 AM 06/05/2022    5:15 AM  CBC  WBC 4.0 - 10.5 K/uL 50.0  39.8  52.0   Hemoglobin 13.0 - 17.0 g/dL 10.1  11.1  10.8   Hematocrit 39.0 - 52.0 % 32.7  36.2  33.2   Platelets 150 - 400 K/uL 127  143  158       Latest Ref Rng & Units 06/07/2022    4:42 AM 06/06/2022   11:34 AM 06/05/2022    5:15 AM  CMP  Glucose 70 - 99 mg/dL 134  127  161   BUN 8 - 23 mg/dL '14  14  21   '$ Creatinine 0.61 - 1.24 mg/dL 1.34  1.30  1.11   Sodium 135 - 145 mmol/L 135  134  136   Potassium 3.5 - 5.1 mmol/L 4.6  4.6  4.1   Chloride  98 - 111 mmol/L 105  103  103   CO2 22 - 32 mmol/L '24  21  25   '$ Calcium 8.9 - 10.3 mg/dL 8.5  8.8  8.7   Total Protein 6.5 - 8.1 g/dL  5.9  5.5   Total Bilirubin 0.3 - 1.2 mg/dL  0.8  0.4   Alkaline Phos 38 - 126 U/L  87  82   AST 15 - 41 U/L  15  13   ALT 0 - 44 U/L  14  13      Pertinent Imagings/Other Imagings Plain films and CT images have been personally visualized and interpreted; radiology reports have been reviewed. Decision making incorporated into the Impression / Recommendations.  ECHOCARDIOGRAM COMPLETE  Result Date: 06/05/2022    ECHOCARDIOGRAM REPORT   Patient Name:   Eric Harrington Date of Exam: 06/05/2022 Medical Rec #:  [id number]     Height:       70.0 in Accession #:    [id number]    Weight:       169.4 lb Date of Birth:  July 14, 1940     BSA:          1.945 m Patient Age:    60 years      BP:           160/77 mmHg Patient Gender: M             HR:           72 bpm. Exam Location:  Inpatient Procedure: 2D Echo, Cardiac Doppler and Color Doppler Indications:    I48.91* Unspeicified atrial fibrillation  History:        Patient has no prior history of Echocardiogram examinations.                 Risk Factors:Diabetes, Dyslipidemia and Hypertension.  Sonographer:     Phineas Douglas Referring Phys: [id number] [place]  1. Left ventricular ejection fraction, by estimation, is 60 to 65%. The left ventricle has normal function. The left ventricle has no regional wall motion abnormalities. Left ventricular diastolic parameters are indeterminate.  2. Right ventricular systolic function is normal. The right ventricular size is normal. There is normal pulmonary artery systolic pressure. The estimated right ventricular systolic pressure is 25.0 mmHg.  3. The mitral valve is grossly normal. Trivial mitral valve regurgitation. No evidence of mitral stenosis.  4. The aortic valve is tricuspid. Aortic valve regurgitation is trivial. Aortic valve sclerosis is present, with no evidence of aortic valve stenosis.  5. Aortic dilatation noted. There is borderline dilatation of the aortic root, measuring 38 mm.  6. The inferior vena cava is dilated in size with >50% respiratory variability, suggesting right atrial pressure of 8 mmHg. Comparison(s): No prior Echocardiogram. FINDINGS  Left Ventricle: Left ventricular ejection fraction, by estimation, is 60 to 65%. The left ventricle has normal function. The left ventricle has no regional wall motion abnormalities. The left ventricular internal cavity size was normal in size. There is  no left ventricular hypertrophy. Left ventricular diastolic parameters are indeterminate. Right Ventricle: The right ventricular size is normal. No increase in right ventricular wall thickness. Right ventricular systolic function is normal. There is normal pulmonary artery systolic pressure. The tricuspid regurgitant velocity is 2.53 m/s, and  with an assumed right atrial pressure of 8 mmHg, the estimated right ventricular systolic pressure is 53.9 mmHg. Left Atrium: Left atrial size was normal in size. Right Atrium: Right atrial size was normal in  size. Pericardium: There is no evidence of pericardial effusion. Mitral Valve: The mitral valve is grossly  normal. There is mild thickening of the mitral valve leaflet(s). There is mild calcification of the mitral valve leaflet(s). Trivial mitral valve regurgitation. No evidence of mitral valve stenosis. Tricuspid Valve: The tricuspid valve is normal in structure. Tricuspid valve regurgitation is trivial. Aortic Valve: The aortic valve is tricuspid. Aortic valve regurgitation is trivial. Aortic valve sclerosis is present, with no evidence of aortic valve stenosis. Pulmonic Valve: The pulmonic valve was not well visualized. Pulmonic valve regurgitation is trivial. Aorta: Aortic dilatation noted. There is borderline dilatation of the aortic root, measuring 38 mm. Venous: The inferior vena cava is dilated in size with greater than 50% respiratory variability, suggesting right atrial pressure of 8 mmHg. IAS/Shunts: The atrial septum is grossly normal.  LEFT VENTRICLE PLAX 2D LVIDd:         4.70 cm      Diastology LVIDs:         3.10 cm      LV e' medial:    5.55 cm/s LV PW:         1.30 cm      LV E/e' medial:  15.9 LV IVS:        1.00 cm      LV e' lateral:   7.07 cm/s LVOT diam:     2.20 cm      LV E/e' lateral: 12.4 LV SV:         84 LV SV Index:   43 LVOT Area:     3.80 cm  LV Volumes (MOD) LV vol d, MOD A2C: 111.0 ml LV vol d, MOD A4C: 103.0 ml LV vol s, MOD A2C: 48.0 ml LV vol s, MOD A4C: 38.7 ml LV SV MOD A2C:     63.0 ml LV SV MOD A4C:     103.0 ml LV SV MOD BP:      68.6 ml RIGHT VENTRICLE             IVC RV Basal diam:  3.50 cm     IVC diam: 2.10 cm RV S prime:     17.00 cm/s TAPSE (M-mode): 2.1 cm LEFT ATRIUM             Index        RIGHT ATRIUM           Index LA diam:        3.60 cm 1.85 cm/m   RA Area:     13.90 cm LA Vol (A2C):   53.9 ml 27.71 ml/m  RA Volume:   27.20 ml  13.98 ml/m LA Vol (A4C):   47.2 ml 24.27 ml/m LA Biplane Vol: 53.2 ml 27.35 ml/m  AORTIC VALVE LVOT Vmax:   95.40 cm/s LVOT Vmean:  57.600 cm/s LVOT VTI:    0.222 m  AORTA Ao Root diam: 3.80 cm Ao Asc diam:  3.20 cm MITRAL VALVE                TRICUSPID VALVE MV Area (PHT): 3.27 cm    TR Peak grad:   25.6 mmHg MV Decel Time: 232 msec    TR Vmax:        253.00 cm/s MV E velocity: 88.00 cm/s MV A velocity: 79.10 cm/s  SHUNTS MV E/A ratio:  1.11        Systemic VTI:  0.22 m  Systemic Diam: 2.20 cm Gwyndolyn Kaufman MD Electronically signed by Gwyndolyn Kaufman MD Signature Date/Time: 06/05/2022/11:56:43 AM    Final    DG Chest Portable 1 View  Result Date: 06/04/2022 CLINICAL DATA:  82 year old male with cough for 3 weeks.  Fatigue. EXAM: PORTABLE CHEST 1 VIEW COMPARISON:  Chest radiographs 05/22/2022 and earlier. FINDINGS: Portable AP semi upright view at 0730 hours. Mildly rotated to the right. Lung volumes and mediastinal contours are stable, within normal limits. Allowing for portable technique the lungs are clear. No pneumothorax or pleural effusion. No osseous abnormality identified. IMPRESSION: Negative portable chest. Electronically Signed   By: Genevie Ann M.D.   On: 06/04/2022 08:00   DG Chest 2 View  Result Date: 05/22/2022 CLINICAL DATA:  Cough.  Fever. EXAM: CHEST - 2 VIEW COMPARISON:  CXR 03/06/21 FINDINGS: No pleural effusion. No pneumothorax. Unchanged cardiac and mediastinal contours. No focal airspace opacity. No displaced rib fractures. Visualized upper abdomen is unremarkable. IMPRESSION: No focal airspace opacity. Electronically Signed   By: Marin Roberts M.D.   On: 05/22/2022 12:45    I spent 90  minutes for this patient encounter including review of prior medical records/discussing diagnostics and treatment plan with the patient/family/coordinate care with primary/other specialits with greater than 50% of time in face to face encounter.   Electronically signed by:   Rosiland Oz, MD Infectious Disease Physician [facility] for Infectious Disease Pager: [phone]

## 2022-06-08 ENCOUNTER — Other Ambulatory Visit (HOSPITAL_COMMUNITY): Payer: Self-pay

## 2022-06-08 DIAGNOSIS — Z833 Family history of diabetes mellitus: Secondary | ICD-10-CM | POA: Diagnosis not present

## 2022-06-08 DIAGNOSIS — I480 Paroxysmal atrial fibrillation: Secondary | ICD-10-CM | POA: Diagnosis present

## 2022-06-08 DIAGNOSIS — E785 Hyperlipidemia, unspecified: Secondary | ICD-10-CM | POA: Diagnosis present

## 2022-06-08 DIAGNOSIS — C9110 Chronic lymphocytic leukemia of B-cell type not having achieved remission: Secondary | ICD-10-CM | POA: Diagnosis present

## 2022-06-08 DIAGNOSIS — N1831 Chronic kidney disease, stage 3a: Secondary | ICD-10-CM | POA: Diagnosis present

## 2022-06-08 DIAGNOSIS — Z8249 Family history of ischemic heart disease and other diseases of the circulatory system: Secondary | ICD-10-CM | POA: Diagnosis not present

## 2022-06-08 DIAGNOSIS — E1122 Type 2 diabetes mellitus with diabetic chronic kidney disease: Secondary | ICD-10-CM | POA: Diagnosis present

## 2022-06-08 DIAGNOSIS — R61 Generalized hyperhidrosis: Secondary | ICD-10-CM | POA: Diagnosis present

## 2022-06-08 DIAGNOSIS — Z881 Allergy status to other antibiotic agents status: Secondary | ICD-10-CM | POA: Diagnosis not present

## 2022-06-08 DIAGNOSIS — Z87891 Personal history of nicotine dependence: Secondary | ICD-10-CM | POA: Diagnosis not present

## 2022-06-08 DIAGNOSIS — R7881 Bacteremia: Secondary | ICD-10-CM | POA: Diagnosis present

## 2022-06-08 DIAGNOSIS — Z8616 Personal history of COVID-19: Secondary | ICD-10-CM | POA: Diagnosis not present

## 2022-06-08 DIAGNOSIS — B952 Enterococcus as the cause of diseases classified elsewhere: Secondary | ICD-10-CM | POA: Diagnosis present

## 2022-06-08 DIAGNOSIS — Z7901 Long term (current) use of anticoagulants: Secondary | ICD-10-CM | POA: Diagnosis not present

## 2022-06-08 DIAGNOSIS — N390 Urinary tract infection, site not specified: Secondary | ICD-10-CM | POA: Diagnosis present

## 2022-06-08 DIAGNOSIS — Z1152 Encounter for screening for COVID-19: Secondary | ICD-10-CM | POA: Diagnosis not present

## 2022-06-08 DIAGNOSIS — Z79899 Other long term (current) drug therapy: Secondary | ICD-10-CM | POA: Diagnosis not present

## 2022-06-08 DIAGNOSIS — Z888 Allergy status to other drugs, medicaments and biological substances status: Secondary | ICD-10-CM | POA: Diagnosis not present

## 2022-06-08 DIAGNOSIS — E1142 Type 2 diabetes mellitus with diabetic polyneuropathy: Secondary | ICD-10-CM | POA: Diagnosis present

## 2022-06-08 DIAGNOSIS — Z7984 Long term (current) use of oral hypoglycemic drugs: Secondary | ICD-10-CM | POA: Diagnosis not present

## 2022-06-08 DIAGNOSIS — I129 Hypertensive chronic kidney disease with stage 1 through stage 4 chronic kidney disease, or unspecified chronic kidney disease: Secondary | ICD-10-CM | POA: Diagnosis present

## 2022-06-08 LAB — URINALYSIS, ROUTINE W REFLEX MICROSCOPIC
Bacteria, UA: NONE SEEN
Bilirubin Urine: NEGATIVE
Glucose, UA: >=500 mg/dL — AB
Hgb urine dipstick: NEGATIVE
Ketones, ur: NEGATIVE mg/dL
Leukocytes,Ua: NEGATIVE
Nitrite: NEGATIVE
Protein, ur: NEGATIVE mg/dL
Specific Gravity, Urine: 1.011 (ref 1.005–1.030)
pH: 5 (ref 5.0–8.0)

## 2022-06-08 LAB — COMPREHENSIVE METABOLIC PANEL
ALT: 11 U/L (ref 0–44)
AST: 12 U/L — ABNORMAL LOW (ref 15–41)
Albumin: 3 g/dL — ABNORMAL LOW (ref 3.5–5.0)
Alkaline Phosphatase: 89 U/L (ref 38–126)
Anion gap: 7 (ref 5–15)
BUN: 15 mg/dL (ref 8–23)
CO2: 23 mmol/L (ref 22–32)
Calcium: 8.7 mg/dL — ABNORMAL LOW (ref 8.9–10.3)
Chloride: 103 mmol/L (ref 98–111)
Creatinine, Ser: 1.52 mg/dL — ABNORMAL HIGH (ref 0.61–1.24)
GFR, Estimated: 46 mL/min — ABNORMAL LOW (ref >60)
Glucose, Bld: 161 mg/dL — ABNORMAL HIGH (ref 70–99)
Potassium: 4.3 mmol/L (ref 3.5–5.1)
Sodium: 133 mmol/L — ABNORMAL LOW (ref 135–145)
Total Bilirubin: 0.3 mg/dL (ref 0.3–1.2)
Total Protein: 5.5 g/dL — ABNORMAL LOW (ref 6.5–8.1)

## 2022-06-08 LAB — CULTURE, BLOOD (ROUTINE X 2): Special Requests: ADEQUATE

## 2022-06-08 LAB — GLUCOSE, CAPILLARY
Glucose-Capillary: 161 mg/dL — ABNORMAL HIGH (ref 70–99)
Glucose-Capillary: 169 mg/dL — ABNORMAL HIGH (ref 70–99)
Glucose-Capillary: 131 mg/dL — ABNORMAL HIGH (ref 70–99)
Glucose-Capillary: 266 mg/dL — ABNORMAL HIGH (ref 70–99)

## 2022-06-08 LAB — CBC
HCT: 33.1 % — ABNORMAL LOW (ref 39.0–52.0)
Hemoglobin: 10.9 g/dL — ABNORMAL LOW (ref 13.0–17.0)
MCH: 31.7 pg (ref 26.0–34.0)
MCHC: 32.9 g/dL (ref 30.0–36.0)
MCV: 96.2 fL (ref 80.0–100.0)
Platelets: 121 10*3/uL — ABNORMAL LOW (ref 150–400)
RBC: 3.44 MIL/uL — ABNORMAL LOW (ref 4.22–5.81)
RDW: 14.1 % (ref 11.5–15.5)
WBC: 48.9 10*3/uL — ABNORMAL HIGH (ref 4.0–10.5)
nRBC: 0 % (ref 0.0–0.2)

## 2022-06-08 LAB — MAGNESIUM: Magnesium: 2 mg/dL (ref 1.7–2.4)

## 2022-06-08 MED ORDER — SODIUM CHLORIDE 0.9 % IV SOLN
2.0000 g | Freq: Three times a day (TID) | INTRAVENOUS | Status: DC
  Administered 2022-06-08 – 2022-06-09 (×3): 2 g via INTRAVENOUS
  Filled 2022-06-08 (×5): qty 2000

## 2022-06-08 MED ORDER — AMOXICILLIN 500 MG PO CAPS
1000.0000 mg | ORAL_CAPSULE | Freq: Three times a day (TID) | ORAL | 0 refills | Status: AC
  Filled 2022-06-08: qty 60, 10d supply, fill #0

## 2022-06-08 NOTE — Progress Notes (Addendum)
[facility] Infectious Diseases Follow Up Note  Patient Identification: Patient Name: Eric Harrington MRN: [id number] [place] Date: 06/06/2022 11:07 AM Age: 82 y.o.Today's Date: 06/08/2022  Reason for Visit: bacteremia   Principal Problem:   Positive blood culture (Enterococcus faecalis) Active Problems:   Type 2 diabetes mellitus with complication, without long-term current use of insulin (HCC)   Hyperlipemia   PAF (paroxysmal atrial fibrillation) (HCC)   CLL (chronic lymphocytic leukemia) (HCC)   Chronic kidney disease, stage 3a (HCC)  Antibiotics:  Vancomycin 1/17-c   Lines/Hardware:  Interval Events: remains afebrile, chronic leukocytosis due to CLL. Repeat blood cx NG in 48 hrs    Assessment 82 year old male with PMH as below including CLL followed by oncology Dr.kale, HLD, type II DM with polyneuropathy, pAF, CKD, IBS, GERD, presyncopal who was called back to the ED due to blood cultures growing E faecalis 1/16.  Patient was recently discharged on 1/16 when he presented with A-fib with RVR with low BP. Recently completed a course of Zpack as well as prednisone course by PCP.   # Low-grade E faecalis bacteremia -complained of increased frequency of urination to hospitalist as well as wife reported patient has been struggling with IBS symptoms for a long time.  1/5 and 19 UA unremarkable  1/16 TTE with mild thickening of the MV leaflet as well as mild calcification but no vegetations 1/7 Blood culture NG in 48 hrs   Recommendations Will switch vancomycin to ampicillin If repeat blood cultures on 1/17 negative in 72 hours, okay to switch to amoxicillin p.o. to complete 2 weeks course.  EOT 06/19/22 ID available as needed, please call with questions or of repeat blood cx come back positive D/w ID pharmacy   Rest of the management as per the primary team. Thank you for the consult. Please page with pertinent questions or  concerns.  ______________________________________________________________________ Subjective patient seen and examined at the bedside.  Spoke with wife at bedside.  Wife reports patient has been struggling with symptoms of IBS for a long time and also taking herbals.  Denies any nausea vomiting abdominal pain or diarrhea.  Reported increased frequency of urination to hospitalist but no dysuria.   Vitals BP 107/67   Pulse 67   Temp 98 F (36.7 C) (Oral)   Resp 16   Ht '5\' 10"'$  (1.778 m)   Wt 76.8 kg   SpO2 98%   BMI 24.29 kg/m     Physical Exam Constitutional: Elderly male sitting in the recliner and appears comfortable    Comments: Hard of hearing  Cardiovascular:     Rate and Rhythm: Normal rate and irregular rhythm.     Heart sounds:   Pulmonary:     Effort: Pulmonary effort is normal on room air     Comments:   Abdominal:     Palpations: Abdomen is soft.     Tenderness: Nondistended  Musculoskeletal:        General: No swelling or tenderness in peripheral joints  Skin:    Comments: no rashes   Neurological:     General: awake, alert and oriented, following commands   Psychiatric:        Mood and Affect: Mood normal.   Pertinent Microbiology Results for orders placed or performed during the hospital encounter of 06/06/22  Blood culture (routine x 2)     Status: None (Preliminary result)   Collection Time: 06/06/22 11:28 AM   Specimen: BLOOD LEFT HAND  Result Value Ref Range Status  Specimen Description BLOOD LEFT HAND  Final   Special Requests   Final    BOTTLES DRAWN AEROBIC AND ANAEROBIC Blood Culture results may not be optimal due to an inadequate volume of blood received in culture bottles   Culture   Final    NO GROWTH 2 DAYS Performed at [facility], [place] [address], [place], [place] [address]    Report Status PENDING  Incomplete    Pertinent Lab.    Latest Ref Rng & Units 06/08/2022    4:19 AM 06/07/2022    4:42 AM 06/06/2022   11:34 AM   CBC  WBC 4.0 - 10.5 K/uL 48.9  50.0  39.8   Hemoglobin 13.0 - 17.0 g/dL 10.9  10.1  11.1   Hematocrit 39.0 - 52.0 % 33.1  32.7  36.2   Platelets 150 - 400 K/uL 121  127  143       Latest Ref Rng & Units 06/08/2022    4:19 AM 06/07/2022    4:42 AM 06/06/2022   11:34 AM  CMP  Glucose 70 - 99 mg/dL 161  134  127   BUN 8 - 23 mg/dL '15  14  14   '$ Creatinine 0.61 - 1.24 mg/dL 1.52  1.34  1.30   Sodium 135 - 145 mmol/L 133  135  134   Potassium 3.5 - 5.1 mmol/L 4.3  4.6  4.6   Chloride 98 - 111 mmol/L 103  105  103   CO2 22 - 32 mmol/L '23  24  21   '$ Calcium 8.9 - 10.3 mg/dL 8.7  8.5  8.8   Total Protein 6.5 - 8.1 g/dL 5.5   5.9   Total Bilirubin 0.3 - 1.2 mg/dL 0.3   0.8   Alkaline Phos 38 - 126 U/L 89   87   AST 15 - 41 U/L 12   15   ALT 0 - 44 U/L 11   14      Pertinent Imaging today Plain films and CT images have been personally visualized and interpreted; radiology reports have been reviewed. Decision making incorporated into the Impression / Recommendations.  No results found.  I spent 51  minutes for this patient encounter including review of prior medical records, coordination of care with primary/other specialist with greater than 50% of time being face to face/counseling and discussing diagnostics/treatment plan with the patient/family.  Electronically signed by:   Rosiland Oz, MD Infectious Disease Physician [facility] for Infectious Disease Pager: [phone]

## 2022-06-08 NOTE — Progress Notes (Signed)
PROGRESS NOTE  Eric Harrington HYW:[id number] DOB: Nov 11, 1940 DOA: 06/06/2022 PCP: [name], MD   LOS: 0 days   Brief Narrative / Interim history: 82 y.o. male with medical history significant for CLL, PAF on Eliquis, T2DM, CKD stage IIIa, HTN, HLD who presented to the ED for evaluation of positive blood culture.  He was recently admitted just few days ago for A-fib with RVR, and apparently blood cultures were obtained at that time for unclear reasons.  Blood cultures were positive for Enterococcus and he was called to come back to the hospital.  Subjective / 24h Interval events: He reports increased frequency today and increased urination.  No dysuria.  Assesement and Plan: Principal Problem:   Positive blood culture (Enterococcus faecalis) Active Problems:   PAF (paroxysmal atrial fibrillation) (HCC)   Type 2 diabetes mellitus with complication, without long-term current use of insulin (HCC)   CLL (chronic lymphocytic leukemia) (HCC)   Chronic kidney disease, stage 3a (HCC)   Hyperlipemia  Principal problem Enterococcus faecalis bacteremia -less likely to be contaminant but not sure.  He came in hypotensive and with A-fib with RVR which may be a red flag.  ID consulted and following, discussed with Dr. [place] Bali, surveillance blood cultures obtained and will have to wait 72 hours.  They are negative today at 48 hours. -Has been having increased frequency over the past day, this could potentially suggest an Enterococcus UTI, send a urinalysis however less likely to be of any use given that he is already on antibiotics.  Continue vancomycin -Most recent TEE 1/16 without endocarditis  Active problems PAF-currently on amiodarone as well as Eliquis.  Continue.  He is in sinus rhythm  CLL-follows with oncology as an outpatient, this appears overall stable.  CKD 3A-baseline creatinine 1.1-1.4, currently close to baseline  Hyperlipidemia-continue statin  DM2 -controlled, continue  sliding scale  CBG (last 3)  Recent Labs    06/07/22 1613 06/07/22 1946 06/08/22 0803  GLUCAP 147* 164* 161*     Lab Results  Component Value Date   HGBA1C 7.0 (H) 06/05/2022    Scheduled Meds:  amiodarone  400 mg Oral BID   apixaban  5 mg Oral BID   insulin aspart  0-9 Units Subcutaneous TID WC   multivitamin with minerals  1 tablet Oral Daily   rosuvastatin  5 mg Oral Daily   Continuous Infusions:  ampicillin (OMNIPEN) IV     PRN Meds:.acetaminophen **OR** acetaminophen, guaiFENesin, ondansetron **OR** ondansetron (ZOFRAN) IV, mouth rinse, senna-docusate  Current Outpatient Medications  Medication Instructions   albuterol (VENTOLIN HFA) 108 (90 Base) MCG/ACT inhaler 1 puff, Inhalation, Every 4 hours PRN   amiodarone (PACERONE) 200 MG tablet Take 2 tablets (400 mg total) by mouth 2 (two) times daily for 7 days, THEN 1 tablet (200 mg total) 2 (two) times daily for 7 days, THEN 1 tablet (200 mg total) daily for 16 days.   [START ON 06/09/2022] amoxicillin (AMOXIL) 1,000 mg, Oral, 3 times daily   carboxymethylcellulose (REFRESH PLUS) 0.5 % SOLN 1 drop, Both Eyes, Daily PRN   cetirizine (ZYRTEC) 10 mg, Oral, Daily PRN   cyanocobalamin (VITAMIN B12) 1,000 mcg, Oral, Daily   docusate sodium (COLACE) 100 mg, Oral, Daily   Eliquis 5 mg, Oral, 2 times daily   esomeprazole (NEXIUM) 20 mg, Oral, Daily   Farxiga 10 mg, Oral, Daily   fluticasone (FLONASE) 50 MCG/ACT nasal spray 1 spray, Each Nare, Daily PRN   glimepiride (AMARYL) 2 mg, Oral, Daily with breakfast  Glucose Blood (IGLUCOSE TEST STRIPS VI) In Vitro   guaiFENesin-codeine 100-10 MG/5ML syrup 10 mLs, Oral, Every 4 hours PRN   Lancets MISC Does not apply   metFORMIN (GLUCOPHAGE) 500 mg, Oral, 2 times daily   Multiple Vitamins-Minerals (MULTIVITAMIN ADULTS PO) 1 tablet, Oral, Daily, Immunity MV   rosuvastatin (CRESTOR) 5 mg, Oral, Daily   tobramycin (TOBREX) 0.3 % ophthalmic solution 1 drop, Both Eyes, Every 4 hours    vitamin C 1,000 mg, Oral, Daily   VITAMIN D PO 1,000 Units, Oral, Daily   Zinc 50 mg, Oral, Daily    Diet Orders (From admission, onward)     Start     Ordered   06/06/22 2000  Diet heart healthy/carb modified Room service appropriate? Yes; Fluid consistency: Thin  Diet effective now       Question Answer Comment  Diet-HS Snack? Nothing   Room service appropriate? Yes   Fluid consistency: Thin      06/06/22 1959            DVT prophylaxis:  apixaban (ELIQUIS) tablet 5 mg   Lab Results  Component Value Date   PLT 121 (L) 06/08/2022      Code Status: Full Code  Family Communication: daughter at bedside  Status is: Observation  The patient will require care spanning > 2 midnights and should be moved to inpatient because: Bacteremia requiring IV antibiotics and surveillance cultures   Level of care: Med-Surg  Consultants:  ID  Objective: Vitals:   06/07/22 1036 06/07/22 2000 06/08/22 0500 06/08/22 0829  BP: (!) 153/79 (!) 166/76 126/81 107/67  Pulse: 70 62 70 67  Resp: '16 16  16  '$ Temp: 98.2 F (36.8 C) 98.1 F (36.7 C) 97.7 F (36.5 C) 98 F (36.7 C)  TempSrc: Oral Oral Oral Oral  SpO2: 98%  97% 98%  Weight:      Height:        Intake/Output Summary (Last 24 hours) at 06/08/2022 1030 Last data filed at 06/07/2022 1500 Gross per 24 hour  Intake 122.52 ml  Output --  Net 122.52 ml    Wt Readings from Last 3 Encounters:  06/07/22 76.8 kg  06/05/22 76.8 kg  05/22/22 78.9 kg    Examination:  Constitutional: NAD Eyes: lids and conjunctivae normal, no scleral icterus ENMT: mmm Neck: normal, supple Respiratory: clear to auscultation bilaterally, no wheezing, no crackles. Normal respiratory effort.  Cardiovascular: Regular rate and rhythm, no murmurs / rubs / gallops. No LE edema. Abdomen: soft, no distention, no tenderness. Bowel sounds positive.   Data Reviewed: I have independently reviewed following labs and imaging studies   CBC Recent  Labs  Lab 06/04/22 0807 06/05/22 0515 06/06/22 1134 06/07/22 0442 06/08/22 0419  WBC 61.8* 52.0* 39.8* 50.0* 48.9*  HGB 11.1* 10.8* 11.1* 10.1* 10.9*  HCT 35.0* 33.2* 36.2* 32.7* 33.1*  PLT 213 158 143* 127* 121*  MCV 97.8 97.1 102.8* 101.6* 96.2  MCH 31.0 31.6 31.5 31.4 31.7  MCHC 31.7 32.5 30.7 30.9 32.9  RDW 13.9 13.8 14.0 14.1 14.1  LYMPHSABS  --  48.9* 35.4* 45.9*  --   MONOABS  --  0.0* 1.6* 1.7*  --   EOSABS  --  0.0 0.0 0.0  --   BASOSABS  --  0.0 0.1 0.1  --      Recent Labs  Lab 06/04/22 0807 06/05/22 0515 06/05/22 0822 06/06/22 1134 06/06/22 1144 06/07/22 0442 06/08/22 0419  NA 132* 136  --  134*  --  135 133*  K 3.9 4.1  --  4.6  --  4.6 4.3  CL 97* 103  --  103  --  105 103  CO2 26 25  --  21*  --  24 23  GLUCOSE 148* 161*  --  127*  --  134* 161*  BUN 29* 21  --  14  --  14 15  CREATININE 1.38* 1.11  --  1.30*  --  1.34* 1.52*  CALCIUM 8.3* 8.7*  --  8.8*  --  8.5* 8.7*  AST  --  13*  --  15  --   --  12*  ALT  --  13  --  14  --   --  11  ALKPHOS  --  82  --  87  --   --  89  BILITOT  --  0.4  --  0.8  --   --  0.3  ALBUMIN  --  3.1*  --  3.4*  --   --  3.0*  MG  --  2.0  --   --   --   --  2.0  PROCALCITON  --  <0.10  --   --   --   --   --   LATICACIDVEN  --  0.9 0.7  --  1.3  --   --   TSH  --  0.789  --   --   --   --   --   HGBA1C  --  7.0*  --   --   --   --   --      ------------------------------------------------------------------------------------------------------------------ No results for input(s): "CHOL", "HDL", "LDLCALC", "TRIG", "CHOLHDL", "LDLDIRECT" in the last 72 hours.  Lab Results  Component Value Date   HGBA1C 7.0 (H) 06/05/2022   ------------------------------------------------------------------------------------------------------------------ No results for input(s): "TSH", "T4TOTAL", "T3FREE", "THYROIDAB" in the last 72 hours.  Invalid input(s): "FREET3"   Cardiac Enzymes No results for input(s): "CKMB",  "TROPONINI", "MYOGLOBIN" in the last 168 hours.  Invalid input(s): "CK" ------------------------------------------------------------------------------------------------------------------ No results found for: "BNP"  CBG: Recent Labs  Lab 06/07/22 0824 06/07/22 1146 06/07/22 1613 06/07/22 1946 06/08/22 0803  GLUCAP 124* 164* 147* 164* 161*     Recent Results (from the past 240 hour(s))  Resp panel by RT-PCR (RSV, Flu A&B, Covid) Anterior Nasal Swab     Status: None   Collection Time: 06/04/22  8:58 AM   Specimen: Anterior Nasal Swab  Result Value Ref Range Status   SARS Coronavirus 2 by RT PCR NEGATIVE NEGATIVE Final    Comment: (NOTE) SARS-CoV-2 target nucleic acids are NOT DETECTED.  The SARS-CoV-2 RNA is generally detectable in upper respiratory specimens during the acute phase of infection. The lowest concentration of SARS-CoV-2 viral copies this assay can detect is 138 copies/mL. A negative result does not preclude SARS-Cov-2 infection and should not be used as the sole basis for treatment or other patient management decisions. A negative result may occur with  improper specimen collection/handling, submission of specimen other than nasopharyngeal swab, presence of viral mutation(s) within the areas targeted by this assay, and inadequate number of viral copies(<138 copies/mL). A negative result must be combined with clinical observations, patient history, and epidemiological information. The expected result is Negative.  Fact Sheet for Patients:  EntrepreneurPulse.com.au  Fact Sheet for Healthcare Providers:  IncredibleEmployment.be  This test is no t yet approved or cleared by the [place] FDA and  has been authorized for detection and/or diagnosis  of SARS-CoV-2 by FDA under an Emergency Use Authorization (EUA). This EUA will remain  in effect (meaning this test can be used) for the duration of the COVID-19 declaration  under Section 564(b)(1) of the Act, 21 U.S.C.section 360bbb-3(b)(1), unless the authorization is terminated  or revoked sooner.       Influenza A by PCR NEGATIVE NEGATIVE Final   Influenza B by PCR NEGATIVE NEGATIVE Final    Comment: (NOTE) The Xpert Xpress SARS-CoV-2/FLU/RSV plus assay is intended as an aid in the diagnosis of influenza from Nasopharyngeal swab specimens and should not be used as a sole basis for treatment. Nasal washings and aspirates are unacceptable for Xpert Xpress SARS-CoV-2/FLU/RSV testing.  Fact Sheet for Patients: EntrepreneurPulse.com.au  Fact Sheet for Healthcare Providers: IncredibleEmployment.be  This test is not yet approved or cleared by the [place] FDA and has been authorized for detection and/or diagnosis of SARS-CoV-2 by FDA under an Emergency Use Authorization (EUA). This EUA will remain in effect (meaning this test can be used) for the duration of the COVID-19 declaration under Section 564(b)(1) of the Act, 21 U.S.C. section 360bbb-3(b)(1), unless the authorization is terminated or revoked.     Resp Syncytial Virus by PCR NEGATIVE NEGATIVE Final    Comment: (NOTE) Fact Sheet for Patients: EntrepreneurPulse.com.au  Fact Sheet for Healthcare Providers: IncredibleEmployment.be  This test is not yet approved or cleared by the [place] FDA and has been authorized for detection and/or diagnosis of SARS-CoV-2 by FDA under an Emergency Use Authorization (EUA). This EUA will remain in effect (meaning this test can be used) for the duration of the COVID-19 declaration under Section 564(b)(1) of the Act, 21 U.S.C. section 360bbb-3(b)(1), unless the authorization is terminated or revoked.  Performed at [facility], [address] [place], [place] [address]   MRSA Next Gen by PCR, Nasal     Status: None   Collection Time: 06/05/22  3:06 AM    Specimen: Nasal Mucosa; Nasal Swab  Result Value Ref Range Status   MRSA by PCR Next Gen NOT DETECTED NOT DETECTED Final    Comment: (NOTE) The GeneXpert MRSA Assay (FDA approved for NASAL specimens only), is one component of a comprehensive MRSA colonization surveillance program. It is not intended to diagnose MRSA infection nor to guide or monitor treatment for MRSA infections. Test performance is not FDA approved in patients less than 25 years old. Performed at [facility], [place] [address] [place], [place] [address]   Culture, blood (Routine X 2) w Reflex to ID Panel     Status: None (Preliminary result)   Collection Time: 06/05/22  5:17 AM   Specimen: BLOOD LEFT ARM  Result Value Ref Range Status   Specimen Description BLOOD LEFT ARM  Final   Special Requests   Final    BOTTLES DRAWN AEROBIC AND ANAEROBIC Blood Culture results may not be optimal due to an excessive volume of blood received in culture bottles   Culture   Final    NO GROWTH 3 DAYS Performed at [facility], [place] [address] [place], [place] [address]    Report Status PENDING  Incomplete  Culture, blood (Routine X 2) w Reflex to ID Panel     Status: Abnormal   Collection Time: 06/05/22  5:18 AM   Specimen: BLOOD  Result Value Ref Range Status   Specimen Description BLOOD BLOOD LEFT HAND  Final   Special Requests   Final    BOTTLES DRAWN  AEROBIC AND ANAEROBIC Blood Culture adequate volume   Culture  Setup Time   Final    GRAM POSITIVE COCCI IN BOTH AEROBIC AND ANAEROBIC BOTTLES CRITICAL RESULT CALLED TO, READ BACK BY AND VERIFIED WITH: Barbaraann Boys RN 06/06/22 @ 0610 BY AB Performed at [facility], [place] [address], [place], [place] [address]    Culture ENTEROCOCCUS FAECALIS (A)  Final   Report Status 06/08/2022 FINAL  Final   Organism ID, Bacteria ENTEROCOCCUS FAECALIS  Final      Susceptibility   Enterococcus faecalis - MIC*    AMPICILLIN <=2 SENSITIVE Sensitive     VANCOMYCIN 1  SENSITIVE Sensitive     GENTAMICIN SYNERGY RESISTANT Resistant     * ENTEROCOCCUS FAECALIS  Blood Culture ID Panel (Reflexed)     Status: Abnormal   Collection Time: 06/05/22  5:18 AM  Result Value Ref Range Status   Enterococcus faecalis DETECTED (A) NOT DETECTED Final    Comment: CRITICAL RESULT CALLED TO, READ BACK BY AND VERIFIED WITH: Barbaraann Boys RN 06/06/22 @ 0610 BY AB    Enterococcus Faecium NOT DETECTED NOT DETECTED Final   Listeria monocytogenes NOT DETECTED NOT DETECTED Final   Staphylococcus species NOT DETECTED NOT DETECTED Final   Staphylococcus aureus (BCID) NOT DETECTED NOT DETECTED Final   Staphylococcus epidermidis NOT DETECTED NOT DETECTED Final   Staphylococcus lugdunensis NOT DETECTED NOT DETECTED Final   Streptococcus species NOT DETECTED NOT DETECTED Final   Streptococcus agalactiae NOT DETECTED NOT DETECTED Final   Streptococcus pneumoniae NOT DETECTED NOT DETECTED Final   Streptococcus pyogenes NOT DETECTED NOT DETECTED Final   A.calcoaceticus-baumannii NOT DETECTED NOT DETECTED Final   Bacteroides fragilis NOT DETECTED NOT DETECTED Final   Enterobacterales NOT DETECTED NOT DETECTED Final   Enterobacter cloacae complex NOT DETECTED NOT DETECTED Final   Escherichia coli NOT DETECTED NOT DETECTED Final   Klebsiella aerogenes NOT DETECTED NOT DETECTED Final   Klebsiella oxytoca NOT DETECTED NOT DETECTED Final   Klebsiella pneumoniae NOT DETECTED NOT DETECTED Final   Proteus species NOT DETECTED NOT DETECTED Final   Salmonella species NOT DETECTED NOT DETECTED Final   Serratia marcescens NOT DETECTED NOT DETECTED Final   Haemophilus influenzae NOT DETECTED NOT DETECTED Final   Neisseria meningitidis NOT DETECTED NOT DETECTED Final   Pseudomonas aeruginosa NOT DETECTED NOT DETECTED Final   Stenotrophomonas maltophilia NOT DETECTED NOT DETECTED Final   Candida albicans NOT DETECTED NOT DETECTED Final   Candida auris NOT DETECTED NOT DETECTED Final   Candida  glabrata NOT DETECTED NOT DETECTED Final   Candida krusei NOT DETECTED NOT DETECTED Final   Candida parapsilosis NOT DETECTED NOT DETECTED Final   Candida tropicalis NOT DETECTED NOT DETECTED Final   Cryptococcus neoformans/gattii NOT DETECTED NOT DETECTED Final   Vancomycin resistance NOT DETECTED NOT DETECTED Final    Comment: Performed at [facility], [address], [place], [place] [address]  Blood culture (routine x 2)     Status: None (Preliminary result)   Collection Time: 06/06/22 11:28 AM   Specimen: BLOOD LEFT HAND  Result Value Ref Range Status   Specimen Description BLOOD LEFT HAND  Final   Special Requests   Final    BOTTLES DRAWN AEROBIC AND ANAEROBIC Blood Culture results may not be optimal due to an inadequate volume of blood received in culture bottles   Culture   Final    NO GROWTH 2 DAYS Performed at [facility], [place],  [place] [address]    Report Status PENDING  Incomplete     Radiology Studies: No results found.   Marzetta Board, MD, PhD [facility]  Between 7 am - 7 pm I am available, please contact me via Amion (for emergencies) or Securechat (non urgent messages)  Between 7 pm - 7 am I am not available, please contact night coverage MD/APP via Amion

## 2022-06-08 NOTE — TOC Transition Note (Signed)
Discharge medications (1) are being stored in the main pharmacy on the ground floor until patient is ready for discharge.   

## 2022-06-09 DIAGNOSIS — R7881 Bacteremia: Secondary | ICD-10-CM | POA: Diagnosis not present

## 2022-06-09 LAB — COMPREHENSIVE METABOLIC PANEL
ALT: 13 U/L (ref 0–44)
AST: 14 U/L — ABNORMAL LOW (ref 15–41)
Albumin: 3.1 g/dL — ABNORMAL LOW (ref 3.5–5.0)
Alkaline Phosphatase: 85 U/L (ref 38–126)
Anion gap: 7 (ref 5–15)
BUN: 18 mg/dL (ref 8–23)
CO2: 24 mmol/L (ref 22–32)
Calcium: 8.6 mg/dL — ABNORMAL LOW (ref 8.9–10.3)
Chloride: 102 mmol/L (ref 98–111)
Creatinine, Ser: 1.54 mg/dL — ABNORMAL HIGH (ref 0.61–1.24)
GFR, Estimated: 46 mL/min — ABNORMAL LOW (ref >60)
Glucose, Bld: 192 mg/dL — ABNORMAL HIGH (ref 70–99)
Potassium: 4.1 mmol/L (ref 3.5–5.1)
Sodium: 133 mmol/L — ABNORMAL LOW (ref 135–145)
Total Bilirubin: 0.4 mg/dL (ref 0.3–1.2)
Total Protein: 5.6 g/dL — ABNORMAL LOW (ref 6.5–8.1)

## 2022-06-09 LAB — CBC
HCT: 32.1 % — ABNORMAL LOW (ref 39.0–52.0)
Hemoglobin: 10.6 g/dL — ABNORMAL LOW (ref 13.0–17.0)
MCH: 32.1 pg (ref 26.0–34.0)
MCHC: 33 g/dL (ref 30.0–36.0)
MCV: 97.3 fL (ref 80.0–100.0)
Platelets: 113 10*3/uL — ABNORMAL LOW (ref 150–400)
RBC: 3.3 MIL/uL — ABNORMAL LOW (ref 4.22–5.81)
RDW: 14 % (ref 11.5–15.5)
WBC: 47.6 10*3/uL — ABNORMAL HIGH (ref 4.0–10.5)
nRBC: 0 % (ref 0.0–0.2)

## 2022-06-09 LAB — GLUCOSE, CAPILLARY: Glucose-Capillary: 149 mg/dL — ABNORMAL HIGH (ref 70–99)

## 2022-06-09 NOTE — Plan of Care (Signed)

## 2022-06-09 NOTE — Discharge Summary (Signed)
Physician Discharge Summary  Eric Harrington TDV:[id number] DOB: Apr 06, 1941 DOA: 06/06/2022  PCP: [name], MD  Admit date: 06/06/2022 Discharge date: 06/09/2022  Admitted From: home Disposition:  home  Recommendations for Outpatient Follow-up:  Follow up with PCP in 1-2 weeks Please obtain BMP/CBC in one week  Home Health: none Equipment/Devices: none  Discharge Condition: stable CODE STATUS: Full code Diet Orders (From admission, onward)     Start     Ordered   06/06/22 2000  Diet heart healthy/carb modified Room service appropriate? Yes; Fluid consistency: Thin  Diet effective now       Question Answer Comment  Diet-HS Snack? Nothing   Room service appropriate? Yes   Fluid consistency: Thin      06/06/22 1959            HPI: Per admitting MD, NOAL ABSHIER is a 82 y.o. male with medical history significant for CLL, PAF on Eliquis, T2DM, CKD stage IIIa, HTN, HLD who presented to the ED for evaluation of positive blood culture. Patient recently admitted 06/04/2022-06/05/2022 for atrial fibrillation with RVR.  Initially treated with IV amiodarone and heparin gtt with conversion back to sinus rhythm.  He was discharged on oral amiodarone load and Eliquis. Patient was called back today by infectious disease (1/17) that a blood culture drawn 1/16 was positive for Enterococcus faecalis and advised to present to the ED for IV antibiotics. Patient states he is currently feeling well, better than when he left the hospital yesterday.  He reports chronic issues with diaphoresis related to his CLL otherwise no fevers or chills.  Denies chest pain, dyspnea, nausea, vomiting, abdominal pain, dysuria, diarrhea.  He reports a chronic cough at nighttime.  Hospital Course / Discharge diagnoses: Principal Problem:   Positive blood culture (Enterococcus faecalis) Active Problems:   PAF (paroxysmal atrial fibrillation) (HCC)   Type 2 diabetes mellitus with complication, without long-term  current use of insulin (HCC)   CLL (chronic lymphocytic leukemia) (HCC)   Chronic kidney disease, stage 3a (HCC)   Hyperlipemia   Bacteremia   Principal problem Enterococcus faecalis bacteremia, low-grade-less likely to be contaminant but not sure.  During his most recent hospitalization 2 days ago he came in hypotensive and with A-fib with RVR which may have been related to that.  ID was consulted and followed patient while hospitalized.  Surveillance blood cultures were obtained, and they have remained negative currently at 72 hours.  Patient does report increased urinary frequency which may have represented a transient UTI that might have caused his bacteremia.  Given negative surveillance cultures, discussed with ID, patient will be transition to oral amoxicillin for 10 additional days to complete a 2-week course, with the end treatment on 06/19/2022. Most recent TEE 1/16 without endocarditis   Active problems PAF-currently on amiodarone as well as Eliquis.  Continue.  He is in sinus rhythm  CLL-follows with oncology as an outpatient, this appears overall stable. CKD 3A-baseline creatinine 1.1-1.4, currently close to baseline Hyperlipidemia-continue statin DM2  - resume home medications  Sepsis ruled out   Discharge Instructions   Allergies as of 06/09/2022       Reactions   Amlodipine Besylate Rash   Fatigue, chills, fever   Bystolic [nebivolol Hcl]    Cephalexin    Doxycycline    Fever/chills   Gemfibrozil    Glipizide    Januvia [sitagliptin]    Lipitor [atorvastatin Calcium]    Lisinopril Other (See Comments)   Pt states lisinopril causes  severe constipation   Losartan    Metoprolol Other (See Comments)   weakness   Omeprazole    Simvastatin    Gabapentin         Medication List     TAKE these medications    albuterol 108 (90 Base) MCG/ACT inhaler Commonly known as: VENTOLIN HFA Inhale 1 puff into the lungs every 4 (four) hours as needed for wheezing or  shortness of breath.   amiodarone 200 MG tablet Commonly known as: Pacerone Take 2 tablets (400 mg total) by mouth 2 (two) times daily for 7 days, THEN 1 tablet (200 mg total) 2 (two) times daily for 7 days, THEN 1 tablet (200 mg total) daily for 16 days. Start taking on: June 05, 2022   amoxicillin 500 MG capsule Commonly known as: AMOXIL Take 2 capsules (1,000 mg total) by mouth 3 (three) times daily for 10 days.   carboxymethylcellulose 0.5 % Soln Commonly known as: REFRESH PLUS Place 1 drop into both eyes daily as needed (dry eyes).   cetirizine 10 MG tablet Commonly known as: ZYRTEC Take 10 mg by mouth daily as needed for allergies.   cyanocobalamin 1000 MCG tablet Commonly known as: VITAMIN B12 Take 1,000 mcg by mouth daily.   docusate sodium 100 MG capsule Commonly known as: COLACE Take 100 mg by mouth daily.   Eliquis 5 MG Tabs tablet Generic drug: apixaban Take 1 tablet (5 mg total) by mouth 2 (two) times daily.   esomeprazole 20 MG capsule Commonly known as: NEXIUM Take 20 mg by mouth daily at 12 noon.   Farxiga 10 MG Tabs tablet Generic drug: dapagliflozin propanediol Take 10 mg by mouth daily.   fluticasone 50 MCG/ACT nasal spray Commonly known as: FLONASE Place 1 spray into both nostrils daily as needed for allergies.   glimepiride 2 MG tablet Commonly known as: AMARYL Take 2 mg by mouth daily with breakfast.   guaiFENesin-codeine 100-10 MG/5ML syrup Take 10 mLs by mouth every 4 (four) hours as needed for cough.   IGLUCOSE TEST STRIPS VI by In Vitro route.   Lancets Misc by Does not apply route.   metFORMIN 1000 MG tablet Commonly known as: GLUCOPHAGE Take 500 mg by mouth 2 (two) times daily.   MULTIVITAMIN ADULTS PO Take 1 tablet by mouth daily. Immunity MV   rosuvastatin 5 MG tablet Commonly known as: CRESTOR Take 5 mg by mouth daily.   tobramycin 0.3 % ophthalmic solution Commonly known as: TOBREX Place 1 drop into both eyes  every 4 (four) hours.   vitamin C 1000 MG tablet Take 1,000 mg by mouth daily.   VITAMIN D PO Take 1,000 Units by mouth daily.   Zinc 50 MG Tabs Take 50 mg by mouth daily.       Consultations: ID  Procedures/Studies:  ECHOCARDIOGRAM COMPLETE  Result Date: 06/05/2022    ECHOCARDIOGRAM REPORT   Patient Name:   MEHMET SCALLY Date of Exam: 06/05/2022 Medical Rec #:  [id number]     Height:       70.0 in Accession #:    [id number]    Weight:       169.4 lb Date of Birth:  1940/06/16     BSA:          1.945 m Patient Age:    49 years      BP:           160/77 mmHg Patient Gender: M  HR:           72 bpm. Exam Location:  Inpatient Procedure: 2D Echo, Cardiac Doppler and Color Doppler Indications:    I48.91* Unspeicified atrial fibrillation  History:        Patient has no prior history of Echocardiogram examinations.                 Risk Factors:Diabetes, Dyslipidemia and Hypertension.  Sonographer:    Phineas Douglas Referring Phys: [id number] [place]  1. Left ventricular ejection fraction, by estimation, is 60 to 65%. The left ventricle has normal function. The left ventricle has no regional wall motion abnormalities. Left ventricular diastolic parameters are indeterminate.  2. Right ventricular systolic function is normal. The right ventricular size is normal. There is normal pulmonary artery systolic pressure. The estimated right ventricular systolic pressure is 45.4 mmHg.  3. The mitral valve is grossly normal. Trivial mitral valve regurgitation. No evidence of mitral stenosis.  4. The aortic valve is tricuspid. Aortic valve regurgitation is trivial. Aortic valve sclerosis is present, with no evidence of aortic valve stenosis.  5. Aortic dilatation noted. There is borderline dilatation of the aortic root, measuring 38 mm.  6. The inferior vena cava is dilated in size with >50% respiratory variability, suggesting right atrial pressure of 8 mmHg. Comparison(s): No prior  Echocardiogram. FINDINGS  Left Ventricle: Left ventricular ejection fraction, by estimation, is 60 to 65%. The left ventricle has normal function. The left ventricle has no regional wall motion abnormalities. The left ventricular internal cavity size was normal in size. There is  no left ventricular hypertrophy. Left ventricular diastolic parameters are indeterminate. Right Ventricle: The right ventricular size is normal. No increase in right ventricular wall thickness. Right ventricular systolic function is normal. There is normal pulmonary artery systolic pressure. The tricuspid regurgitant velocity is 2.53 m/s, and  with an assumed right atrial pressure of 8 mmHg, the estimated right ventricular systolic pressure is 09.8 mmHg. Left Atrium: Left atrial size was normal in size. Right Atrium: Right atrial size was normal in size. Pericardium: There is no evidence of pericardial effusion. Mitral Valve: The mitral valve is grossly normal. There is mild thickening of the mitral valve leaflet(s). There is mild calcification of the mitral valve leaflet(s). Trivial mitral valve regurgitation. No evidence of mitral valve stenosis. Tricuspid Valve: The tricuspid valve is normal in structure. Tricuspid valve regurgitation is trivial. Aortic Valve: The aortic valve is tricuspid. Aortic valve regurgitation is trivial. Aortic valve sclerosis is present, with no evidence of aortic valve stenosis. Pulmonic Valve: The pulmonic valve was not well visualized. Pulmonic valve regurgitation is trivial. Aorta: Aortic dilatation noted. There is borderline dilatation of the aortic root, measuring 38 mm. Venous: The inferior vena cava is dilated in size with greater than 50% respiratory variability, suggesting right atrial pressure of 8 mmHg. IAS/Shunts: The atrial septum is grossly normal.  LEFT VENTRICLE PLAX 2D LVIDd:         4.70 cm      Diastology LVIDs:         3.10 cm      LV e' medial:    5.55 cm/s LV PW:         1.30 cm      LV  E/e' medial:  15.9 LV IVS:        1.00 cm      LV e' lateral:   7.07 cm/s LVOT diam:     2.20 cm  LV E/e' lateral: 12.4 LV SV:         84 LV SV Index:   43 LVOT Area:     3.80 cm  LV Volumes (MOD) LV vol d, MOD A2C: 111.0 ml LV vol d, MOD A4C: 103.0 ml LV vol s, MOD A2C: 48.0 ml LV vol s, MOD A4C: 38.7 ml LV SV MOD A2C:     63.0 ml LV SV MOD A4C:     103.0 ml LV SV MOD BP:      68.6 ml RIGHT VENTRICLE             IVC RV Basal diam:  3.50 cm     IVC diam: 2.10 cm RV S prime:     17.00 cm/s TAPSE (M-mode): 2.1 cm LEFT ATRIUM             Index        RIGHT ATRIUM           Index LA diam:        3.60 cm 1.85 cm/m   RA Area:     13.90 cm LA Vol (A2C):   53.9 ml 27.71 ml/m  RA Volume:   27.20 ml  13.98 ml/m LA Vol (A4C):   47.2 ml 24.27 ml/m LA Biplane Vol: 53.2 ml 27.35 ml/m  AORTIC VALVE LVOT Vmax:   95.40 cm/s LVOT Vmean:  57.600 cm/s LVOT VTI:    0.222 m  AORTA Ao Root diam: 3.80 cm Ao Asc diam:  3.20 cm MITRAL VALVE               TRICUSPID VALVE MV Area (PHT): 3.27 cm    TR Peak grad:   25.6 mmHg MV Decel Time: 232 msec    TR Vmax:        253.00 cm/s MV E velocity: 88.00 cm/s MV A velocity: 79.10 cm/s  SHUNTS MV E/A ratio:  1.11        Systemic VTI:  0.22 m                            Systemic Diam: 2.20 cm Gwyndolyn Kaufman MD Electronically signed by Gwyndolyn Kaufman MD Signature Date/Time: 06/05/2022/11:56:43 AM    Final    DG Chest Portable 1 View  Result Date: 06/04/2022 CLINICAL DATA:  82 year old male with cough for 3 weeks.  Fatigue. EXAM: PORTABLE CHEST 1 VIEW COMPARISON:  Chest radiographs 05/22/2022 and earlier. FINDINGS: Portable AP semi upright view at 0730 hours. Mildly rotated to the right. Lung volumes and mediastinal contours are stable, within normal limits. Allowing for portable technique the lungs are clear. No pneumothorax or pleural effusion. No osseous abnormality identified. IMPRESSION: Negative portable chest. Electronically Signed   By: Genevie Ann M.D.   On: 06/04/2022 08:00    DG Chest 2 View  Result Date: 05/22/2022 CLINICAL DATA:  Cough.  Fever. EXAM: CHEST - 2 VIEW COMPARISON:  CXR 03/06/21 FINDINGS: No pleural effusion. No pneumothorax. Unchanged cardiac and mediastinal contours. No focal airspace opacity. No displaced rib fractures. Visualized upper abdomen is unremarkable. IMPRESSION: No focal airspace opacity. Electronically Signed   By: Marin Roberts M.D.   On: 05/22/2022 12:45     Subjective: - no chest pain, shortness of breath, no abdominal pain, nausea or vomiting.   Discharge Exam: BP 138/74 (BP Location: Left Arm)   Pulse (!) 59   Temp 97.9 F (36.6 C)   Resp 18  Ht '5\' 10"'$  (1.778 m)   Wt 76.8 kg   SpO2 95%   BMI 24.29 kg/m   General: Pt is alert, awake, not in acute distress Cardiovascular: RRR, S1/S2 +, no rubs, no gallops Respiratory: CTA bilaterally, no wheezing, no rhonchi Abdominal: Soft, NT, ND, bowel sounds + Extremities: no edema, no cyanosis  The results of significant diagnostics from this hospitalization (including imaging, microbiology, ancillary and laboratory) are listed below for reference.     Microbiology: Recent Results (from the past 240 hour(s))  Resp panel by RT-PCR (RSV, Flu A&B, Covid) Anterior Nasal Swab     Status: None   Collection Time: 06/04/22  8:58 AM   Specimen: Anterior Nasal Swab  Result Value Ref Range Status   SARS Coronavirus 2 by RT PCR NEGATIVE NEGATIVE Final    Comment: (NOTE) SARS-CoV-2 target nucleic acids are NOT DETECTED.  The SARS-CoV-2 RNA is generally detectable in upper respiratory specimens during the acute phase of infection. The lowest concentration of SARS-CoV-2 viral copies this assay can detect is 138 copies/mL. A negative result does not preclude SARS-Cov-2 infection and should not be used as the sole basis for treatment or other patient management decisions. A negative result may occur with  improper specimen collection/handling, submission of specimen other than  nasopharyngeal swab, presence of viral mutation(s) within the areas targeted by this assay, and inadequate number of viral copies(<138 copies/mL). A negative result must be combined with clinical observations, patient history, and epidemiological information. The expected result is Negative.  Fact Sheet for Patients:  EntrepreneurPulse.com.au  Fact Sheet for Healthcare Providers:  IncredibleEmployment.be  This test is no t yet approved or cleared by the [place] FDA and  has been authorized for detection and/or diagnosis of SARS-CoV-2 by FDA under an Emergency Use Authorization (EUA). This EUA will remain  in effect (meaning this test can be used) for the duration of the COVID-19 declaration under Section 564(b)(1) of the Act, 21 U.S.C.section 360bbb-3(b)(1), unless the authorization is terminated  or revoked sooner.       Influenza A by PCR NEGATIVE NEGATIVE Final   Influenza B by PCR NEGATIVE NEGATIVE Final    Comment: (NOTE) The Xpert Xpress SARS-CoV-2/FLU/RSV plus assay is intended as an aid in the diagnosis of influenza from Nasopharyngeal swab specimens and should not be used as a sole basis for treatment. Nasal washings and aspirates are unacceptable for Xpert Xpress SARS-CoV-2/FLU/RSV testing.  Fact Sheet for Patients: EntrepreneurPulse.com.au  Fact Sheet for Healthcare Providers: IncredibleEmployment.be  This test is not yet approved or cleared by the [place] FDA and has been authorized for detection and/or diagnosis of SARS-CoV-2 by FDA under an Emergency Use Authorization (EUA). This EUA will remain in effect (meaning this test can be used) for the duration of the COVID-19 declaration under Section 564(b)(1) of the Act, 21 U.S.C. section 360bbb-3(b)(1), unless the authorization is terminated or revoked.     Resp Syncytial Virus by PCR NEGATIVE NEGATIVE Final    Comment:  (NOTE) Fact Sheet for Patients: EntrepreneurPulse.com.au  Fact Sheet for Healthcare Providers: IncredibleEmployment.be  This test is not yet approved or cleared by the [place] FDA and has been authorized for detection and/or diagnosis of SARS-CoV-2 by FDA under an Emergency Use Authorization (EUA). This EUA will remain in effect (meaning this test can be used) for the duration of the COVID-19 declaration under Section 564(b)(1) of the Act, 21 U.S.C. section 360bbb-3(b)(1), unless the authorization is terminated or revoked.  Performed at [facility]  [address], [address] [place], [place] [address]   MRSA Next Gen by PCR, Nasal     Status: None   Collection Time: 06/05/22  3:06 AM   Specimen: Nasal Mucosa; Nasal Swab  Result Value Ref Range Status   MRSA by PCR Next Gen NOT DETECTED NOT DETECTED Final    Comment: (NOTE) The GeneXpert MRSA Assay (FDA approved for NASAL specimens only), is one component of a comprehensive MRSA colonization surveillance program. It is not intended to diagnose MRSA infection nor to guide or monitor treatment for MRSA infections. Test performance is not FDA approved in patients less than 58 years old. Performed at [facility], [place] [address] [place], [place] [address]   Culture, blood (Routine X 2) w Reflex to ID Panel     Status: None (Preliminary result)   Collection Time: 06/05/22  5:17 AM   Specimen: BLOOD LEFT ARM  Result Value Ref Range Status   Specimen Description BLOOD LEFT ARM  Final   Special Requests   Final    BOTTLES DRAWN AEROBIC AND ANAEROBIC Blood Culture results may not be optimal due to an excessive volume of blood received in culture bottles   Culture   Final    NO GROWTH 4 DAYS Performed at [facility], [place] [address] [place], [place] [address]    Report Status PENDING  Incomplete  Culture, blood (Routine X 2) w Reflex to ID Panel     Status: Abnormal    Collection Time: 06/05/22  5:18 AM   Specimen: BLOOD  Result Value Ref Range Status   Specimen Description BLOOD BLOOD LEFT HAND  Final   Special Requests   Final    BOTTLES DRAWN AEROBIC AND ANAEROBIC Blood Culture adequate volume   Culture  Setup Time   Final    GRAM POSITIVE COCCI IN BOTH AEROBIC AND ANAEROBIC BOTTLES CRITICAL RESULT CALLED TO, READ BACK BY AND VERIFIED WITH: Barbaraann Boys RN 06/06/22 @ 0610 BY AB Performed at [facility], [place] [address] [place],  [address]    Culture ENTEROCOCCUS FAECALIS (A)  Final   Report Status 06/08/2022 FINAL  Final   Organism ID, Bacteria ENTEROCOCCUS FAECALIS  Final      Susceptibility   Enterococcus faecalis - MIC*    AMPICILLIN <=2 SENSITIVE Sensitive     VANCOMYCIN 1 SENSITIVE Sensitive     GENTAMICIN SYNERGY RESISTANT Resistant     * ENTEROCOCCUS FAECALIS  Blood Culture ID Panel (Reflexed)     Status: Abnormal   Collection Time: 06/05/22  5:18 AM  Result Value Ref Range Status   Enterococcus faecalis DETECTED (A) NOT DETECTED Final    Comment: CRITICAL RESULT CALLED TO, READ BACK BY AND VERIFIED WITH: Barbaraann Boys RN 06/06/22 @ 0610 BY AB    Enterococcus Faecium NOT DETECTED NOT DETECTED Final   Listeria monocytogenes NOT DETECTED NOT DETECTED Final   Staphylococcus species NOT DETECTED NOT DETECTED Final   Staphylococcus aureus (BCID) NOT DETECTED NOT DETECTED Final   Staphylococcus epidermidis NOT DETECTED NOT DETECTED Final   Staphylococcus lugdunensis NOT DETECTED NOT DETECTED Final   Streptococcus species NOT DETECTED NOT DETECTED Final   Streptococcus agalactiae NOT DETECTED NOT DETECTED Final   Streptococcus pneumoniae NOT DETECTED NOT DETECTED Final   Streptococcus pyogenes NOT DETECTED NOT DETECTED Final   A.calcoaceticus-baumannii NOT DETECTED NOT DETECTED Final   Bacteroides fragilis NOT DETECTED NOT DETECTED Final   Enterobacterales NOT DETECTED NOT DETECTED Final  Enterobacter cloacae complex NOT  DETECTED NOT DETECTED Final   Escherichia coli NOT DETECTED NOT DETECTED Final   Klebsiella aerogenes NOT DETECTED NOT DETECTED Final   Klebsiella oxytoca NOT DETECTED NOT DETECTED Final   Klebsiella pneumoniae NOT DETECTED NOT DETECTED Final   Proteus species NOT DETECTED NOT DETECTED Final   Salmonella species NOT DETECTED NOT DETECTED Final   Serratia marcescens NOT DETECTED NOT DETECTED Final   Haemophilus influenzae NOT DETECTED NOT DETECTED Final   Neisseria meningitidis NOT DETECTED NOT DETECTED Final   Pseudomonas aeruginosa NOT DETECTED NOT DETECTED Final   Stenotrophomonas maltophilia NOT DETECTED NOT DETECTED Final   Candida albicans NOT DETECTED NOT DETECTED Final   Candida auris NOT DETECTED NOT DETECTED Final   Candida glabrata NOT DETECTED NOT DETECTED Final   Candida krusei NOT DETECTED NOT DETECTED Final   Candida parapsilosis NOT DETECTED NOT DETECTED Final   Candida tropicalis NOT DETECTED NOT DETECTED Final   Cryptococcus neoformans/gattii NOT DETECTED NOT DETECTED Final   Vancomycin resistance NOT DETECTED NOT DETECTED Final    Comment: Performed at [facility], [place] [address], [place], [place] [address]  Blood culture (routine x 2)     Status: None (Preliminary result)   Collection Time: 06/06/22 11:28 AM   Specimen: BLOOD LEFT HAND  Result Value Ref Range Status   Specimen Description BLOOD LEFT HAND  Final   Special Requests   Final    BOTTLES DRAWN AEROBIC AND ANAEROBIC Blood Culture results may not be optimal due to an inadequate volume of blood received in culture bottles   Culture   Final    NO GROWTH 3 DAYS Performed at [facility], [place] [address], [place], [place] [address]    Report Status PENDING  Incomplete     Labs: Basic Metabolic Panel: Recent Labs  Lab 06/05/22 0515 06/06/22 1134 06/07/22 0442 06/08/22 0419 06/09/22 0347  NA 136 134* 135 133* 133*  K 4.1 4.6 4.6 4.3 4.1  CL 103 103 105 103 102  CO2 25 21* '24 23 24   '$ GLUCOSE 161* 127* 134* 161* 192*  BUN '21 14 14 15 18  '$ CREATININE 1.11 1.30* 1.34* 1.52* 1.54*  CALCIUM 8.7* 8.8* 8.5* 8.7* 8.6*  MG 2.0  --   --  2.0  --   PHOS 3.1  --   --   --   --    Liver Function Tests: Recent Labs  Lab 06/05/22 0515 06/06/22 1134 06/08/22 0419 06/09/22 0347  AST 13* 15 12* 14*  ALT '13 14 11 13  '$ ALKPHOS 82 87 89 85  BILITOT 0.4 0.8 0.3 0.4  PROT 5.5* 5.9* 5.5* 5.6*  ALBUMIN 3.1* 3.4* 3.0* 3.1*   CBC: Recent Labs  Lab 06/05/22 0515 06/06/22 1134 06/07/22 0442 06/08/22 0419 06/09/22 0347  WBC 52.0* 39.8* 50.0* 48.9* 47.6*  NEUTROABS 1.6* 2.7 2.3  --   --   HGB 10.8* 11.1* 10.1* 10.9* 10.6*  HCT 33.2* 36.2* 32.7* 33.1* 32.1*  MCV 97.1 102.8* 101.6* 96.2 97.3  PLT 158 143* 127* 121* 113*   CBG: Recent Labs  Lab 06/08/22 0803 06/08/22 1136 06/08/22 1606 06/08/22 1933 06/09/22 0804  GLUCAP 161* 169* 131* 266* 149*   Hgb A1c No results for input(s): "HGBA1C" in the last 72 hours. Lipid Profile No results for input(s): "CHOL", "HDL", "LDLCALC", "TRIG", "CHOLHDL", "LDLDIRECT" in the last 72 hours. Thyroid function studies No results for input(s): "TSH", "T4TOTAL", "T3FREE", "THYROIDAB" in the last 72 hours.  Invalid input(s): "FREET3" Urinalysis    Component Value Date/Time   COLORURINE YELLOW 06/08/2022 [place] 06/08/2022 0950   LABSPEC 1.011 06/08/2022 0950   PHURINE 5.0 06/08/2022 0950   GLUCOSEU >=500 (A) 06/08/2022 0950   HGBUR NEGATIVE 06/08/2022 0950   BILIRUBINUR NEGATIVE 06/08/2022 0950   KETONESUR NEGATIVE 06/08/2022 0950   PROTEINUR NEGATIVE 06/08/2022 0950   NITRITE NEGATIVE 06/08/2022 0950   LEUKOCYTESUR NEGATIVE 06/08/2022 0950    FURTHER DISCHARGE INSTRUCTIONS:   Get Medicines reviewed and adjusted: Please take all your medications with you for your next visit with your Primary MD   Laboratory/radiological data: Please request your Primary MD to go over all hospital tests and  procedure/radiological results at the follow up, please ask your Primary MD to get all Hospital records sent to his/her office.   In some cases, they will be blood work, cultures and biopsy results pending at the time of your discharge. Please request that your primary care M.D. goes through all the records of your hospital data and follows up on these results.   Also Note the following: If you experience worsening of your admission symptoms, develop shortness of breath, life threatening emergency, suicidal or homicidal thoughts you must seek medical attention immediately by calling 911 or calling your MD immediately  if symptoms less severe.   You must read complete instructions/literature along with all the possible adverse reactions/side effects for all the Medicines you take and that have been prescribed to you. Take any new Medicines after you have completely understood and accpet all the possible adverse reactions/side effects.    Do not drive when taking Pain medications or sleeping medications (Benzodaizepines)   Do not take more than prescribed Pain, Sleep and Anxiety Medications. It is not advisable to combine anxiety,sleep and pain medications without talking with your primary care practitioner   Special Instructions: If you have smoked or chewed Tobacco  in the last 2 yrs please stop smoking, stop any regular Alcohol  and or any Recreational drug use.   Wear Seat belts while driving.   Please note: You were cared for by a hospitalist during your hospital stay. Once you are discharged, your primary care physician will handle any further medical issues. Please note that NO REFILLS for any discharge medications will be authorized once you are discharged, as it is imperative that you return to your primary care physician (or establish a relationship with a primary care physician if you do not have one) for your post hospital discharge needs so that they can reassess your need for  medications and monitor your lab values.  Time coordinating discharge: 35 minutes  SIGNED:  Marzetta Board, MD, PhD 06/09/2022, 11:45 AM

## 2022-06-09 NOTE — Discharge Instructions (Addendum)
Follow with [name], MD in 5-7 days  Please get a complete blood count and chemistry panel checked by your Primary MD at your next visit, and again as instructed by your Primary MD. Please get your medications reviewed and adjusted by your Primary MD.  Please request your Primary MD to go over all Hospital Tests and Procedure/Radiological results at the follow up, please get all Hospital records sent to your Prim MD by signing hospital release before you go home.  In some cases, there will be blood work, cultures and biopsy results pending at the time of your discharge. Please request that your primary care M.D. goes through all the records of your hospital data and follows up on these results.  If you had Pneumonia of Lung problems at the Hospital: Please get a 2 view Chest X ray done in 6-8 weeks after hospital discharge or sooner if instructed by your Primary MD.  If you have Congestive Heart Failure: Please call your Cardiologist or Primary MD anytime you have any of the following symptoms:  1) 3 pound weight gain in 24 hours or 5 pounds in 1 week  2) shortness of breath, with or without a dry hacking cough  3) swelling in the hands, feet or stomach  4) if you have to sleep on extra pillows at night in order to breathe  Follow cardiac low salt diet and 1.5 lit/day fluid restriction.  If you have diabetes Accuchecks 4 times/day, Once in AM empty stomach and then before each meal. Log in all results and show them to your primary doctor at your next visit. If any glucose reading is under 80 or above 300 call your primary MD immediately.  If you have Seizure/Convulsions/Epilepsy: Please do not drive, operate heavy machinery, participate in activities at heights or participate in high speed sports until you have seen by Primary MD or a Neurologist and advised to do so again. Per [facility] statutes, patients with seizures are not allowed to drive until they have been  seizure-free for six months.  Use caution when using heavy equipment or power tools. Avoid working on ladders or at heights. Take showers instead of baths. Ensure the water temperature is not too high on the home water heater. Do not go swimming alone. Do not lock yourself in a room alone (i.e. bathroom). When caring for infants or small children, sit down when holding, feeding, or changing them to minimize risk of injury to the child in the event you have a seizure. Maintain good sleep hygiene. Avoid alcohol.   If you had Gastrointestinal Bleeding: Please ask your Primary MD to check a complete blood count within one week of discharge or at your next visit. Your endoscopic/colonoscopic biopsies that are pending at the time of discharge, will also need to followed by your Primary MD.  Get Medicines reviewed and adjusted. Please take all your medications with you for your next visit with your Primary MD  Please request your Primary MD to go over all hospital tests and procedure/radiological results at the follow up, please ask your Primary MD to get all Hospital records sent to his/her office.  If you experience worsening of your admission symptoms, develop shortness of breath, life threatening emergency, suicidal or homicidal thoughts you must seek medical attention immediately by calling 911 or calling your MD immediately  if symptoms less severe.  You must read complete instructions/literature along with all the possible adverse reactions/side effects for all the Medicines you take  and that have been prescribed to you. Take any new Medicines after you have completely understood and accpet all the possible adverse reactions/side effects.   Do not drive or operate heavy machinery when taking Pain medications.   Do not take more than prescribed Pain, Sleep and Anxiety Medications  Special Instructions: If you have smoked or chewed Tobacco  in the last 2 yrs please stop smoking, stop any regular  Alcohol  and or any Recreational drug use.  Wear Seat belts while driving.  Please note You were cared for by a hospitalist during your hospital stay. If you have any questions about your discharge medications or the care you received while you were in the hospital after you are discharged, you can call the unit and asked to speak with the hospitalist on call if the hospitalist that took care of you is not available. Once you are discharged, your primary care physician will handle any further medical issues. Please note that NO REFILLS for any discharge medications will be authorized once you are discharged, as it is imperative that you return to your primary care physician (or establish a relationship with a primary care physician if you do not have one) for your aftercare needs so that they can reassess your need for medications and monitor your lab values.  You can reach the hospitalist office at phone [phone] or fax [phone]   If you do not have a primary care physician, you can call [phone] for a physician referral.  Activity: As tolerated with Full fall precautions use walker/cane & assistance as needed    Diet: regular  Disposition Home    ==================================================================================================== Information on my medicine - ELIQUIS (apixaban)  Why was Eliquis prescribed for you? Eliquis was prescribed for you to reduce the risk of a blood clot forming that can cause a stroke if you have a medical condition called atrial fibrillation (a type of irregular heartbeat).  What do You need to know about Eliquis ? Take your Eliquis TWICE DAILY - one tablet in the morning and one tablet in the evening with or without food. If you have difficulty swallowing the tablet whole please discuss with your pharmacist how to take the medication safely.  Take Eliquis exactly as prescribed by your doctor and DO NOT stop taking Eliquis without  talking to the doctor who prescribed the medication.  Stopping may increase your risk of developing a stroke.  Refill your prescription before you run out.  After discharge, you should have regular check-up appointments with your healthcare provider that is prescribing your Eliquis.  In the future your dose may need to be changed if your kidney function or weight changes by a significant amount or as you get older.  What do you do if you miss a dose? If you miss a dose, take it as soon as you remember on the same day and resume taking twice daily.  Do not take more than one dose of ELIQUIS at the same time to make up a missed dose.  Important Safety Information A possible side effect of Eliquis is bleeding. You should call your healthcare provider right away if you experience any of the following: Bleeding from an injury or your nose that does not stop. Unusual colored urine (red or dark brown) or unusual colored stools (red or black). Unusual bruising for unknown reasons. A serious fall or if you hit your head (even if there is no bleeding).  Some medicines may interact with Eliquis and might  increase your risk of bleeding or clotting while on Eliquis. To help avoid this, consult your healthcare provider or pharmacist prior to using any new prescription or non-prescription medications, including herbals, vitamins, non-steroidal anti-inflammatory drugs (NSAIDs) and supplements.  This website has more information on Eliquis (apixaban): http://[email or web address].com/eliquis/home

## 2022-06-10 LAB — CULTURE, BLOOD (ROUTINE X 2): Culture: NO GROWTH

## 2022-06-11 LAB — CULTURE, BLOOD (ROUTINE X 2): Culture: NO GROWTH

## 2022-06-19 ENCOUNTER — Encounter: Payer: Self-pay | Admitting: Cardiology

## 2022-06-19 ENCOUNTER — Ambulatory Visit: Payer: Medicare Other | Admitting: Cardiology

## 2022-06-19 VITALS — BP 116/62 | HR 70 | Resp 16 | Ht 70.0 in | Wt 164.0 lb

## 2022-06-19 DIAGNOSIS — N1831 Chronic kidney disease, stage 3a: Secondary | ICD-10-CM

## 2022-06-19 DIAGNOSIS — I480 Paroxysmal atrial fibrillation: Secondary | ICD-10-CM

## 2022-06-19 DIAGNOSIS — I951 Orthostatic hypotension: Secondary | ICD-10-CM

## 2022-06-19 DIAGNOSIS — I10 Essential (primary) hypertension: Secondary | ICD-10-CM

## 2022-06-19 MED ORDER — AMIODARONE HCL 200 MG PO TABS: 200.0000 mg | ORAL_TABLET | Freq: Every day | ORAL | 1 refills | Status: DC

## 2022-06-19 MED ORDER — MIDODRINE HCL 5 MG PO TABS: 5.0000 mg | ORAL_TABLET | Freq: Three times a day (TID) | ORAL | 1 refills | Status: DC

## 2022-06-19 NOTE — Progress Notes (Addendum)
Primary Physician/Referring:  [name], MD  Patient ID: Eric Harrington, male    DOB: [birth year], 82 y.o.   MRN: [id number]  Subjective   Chief Complaint  Patient presents with   Loss of Consciousness   Atrial Fibrillation   Hospitalization Follow-up    HPI: Eric Harrington  is a 82 y.o. Caucasian male with longstanding history of diabetes mellitus with stage 3 CKD, hyperlipidemia, hypertension, palpitations and near syncopal spells due to severe orthostatic hypotension.  He is now compliant with support hose use.    Admitted to the hospital on 06/04/2022 when he presented with marked generalized weakness, hypotension, dehydration and also new onset A-fib with RVR, was started on amiodarone but converted to sinus rhythm within a few hours.  He was eventually discharged home on amiodarone and anticoagulation.  Readmitted 1 day later on 06/06/2022 due to feeling poorly and positive blood culture with Enterococcus faecalis and he just completed 3 weeks of antibiotic therapy.  TTE was negative for endocarditis.  Except for feeling marked imbalance when he stands up and feeling generally weak he denies palpitations, chest pain, dyspnea, leg edema..  Wife is present.  Past Medical History:  Diagnosis Date   Chronic Leukemia    Diabetes mellitus without complication ([place])    Hyperlipemia 12/02/2018   Hyperlipidemia    Hypertension    Type 2 diabetes mellitus with complication, without long-term current use of insulin ([place]) 12/02/2018    Past Surgical History:  Procedure Laterality Date   CARDIAC CATHETERIZATION  2017   Social History   Tobacco Use   Smoking status: Former    Packs/day: 1.00    Years: 30.00    Total pack years: 30.00    Types: Cigarettes    Quit date: 1980    Years since quitting: 44.1   Smokeless tobacco: Never   Tobacco comments:    started at age 47  Substance Use Topics   Alcohol use: Never    Marital Status: Married   Review of Systems   Cardiovascular:  Negative for chest pain, dyspnea on exertion and leg swelling.  Neurological:  Positive for dizziness and loss of balance.   Objective      06/19/2022    2:57 PM 06/09/2022    8:06 AM 06/08/2022   12:30 PM  Vitals with BMI  Height 5' 10"$     Weight 164 lbs    BMI 0000000    Systolic [id number]  Diastolic 62 74 80  Pulse 70 59 61   Today's Vitals   06/19/22 1457 06/19/22 1459 06/19/22 1500 06/19/22 1501  BP: 116/62     Pulse: 70     Resp: 16     SpO2: 100% 100% 98% 99%  Weight: 164 lb (74.4 kg)     Height: 5' 10"$  (1.778 m)      Body mass index is 23.53 kg/m.  No data found.   Physical Exam Neck:     Vascular: No carotid bruit or JVD.  Cardiovascular:     Rate and Rhythm: Normal rate and regular rhythm.     Pulses: Normal pulses and intact distal pulses.     Heart sounds: Normal heart sounds. No murmur heard.    No gallop.  Pulmonary:     Effort: Pulmonary effort is normal.     Breath sounds: Normal breath sounds.  Abdominal:     General: Bowel sounds are normal.     Palpations: Abdomen is soft.  Musculoskeletal:  Right lower leg: No edema.     Left lower leg: No edema.    Radiology: No results found.  Laboratory examination:   Lab Results  Component Value Date   NA 133 (L) 06/09/2022   K 4.1 06/09/2022   CO2 24 06/09/2022   GLUCOSE 192 (H) 06/09/2022   BUN 18 06/09/2022   CREATININE 1.54 (H) 06/09/2022   CALCIUM 8.6 (L) 06/09/2022   GFRNONAA 46 (L) 06/09/2022       Latest Ref Rng & Units 06/09/2022    3:47 AM 06/08/2022    4:19 AM 06/07/2022    4:42 AM  CMP  Glucose 70 - 99 mg/dL 192  161  134   BUN 8 - 23 mg/dL 18  15  14   $ Creatinine 0.61 - 1.24 mg/dL 1.54  1.52  1.34   Sodium 135 - 145 mmol/L 133  133  135   Potassium 3.5 - 5.1 mmol/L 4.1  4.3  4.6   Chloride 98 - 111 mmol/L 102  103  105   CO2 22 - 32 mmol/L 24  23  24   $ Calcium 8.9 - 10.3 mg/dL 8.6  8.7  8.5   Total Protein 6.5 - 8.1 g/dL 5.6  5.5    Total Bilirubin  0.3 - 1.2 mg/dL 0.4  0.3    Alkaline Phos 38 - 126 U/L 85  89    AST 15 - 41 U/L 14  12    ALT 0 - 44 U/L 13  11        Latest Ref Rng & Units 06/09/2022    3:47 AM 06/08/2022    4:19 AM 06/07/2022    4:42 AM  CBC  WBC 4.0 - 10.5 K/uL 47.6  48.9  50.0   Hemoglobin 13.0 - 17.0 g/dL 10.6  10.9  10.1   Hematocrit 39.0 - 52.0 % 32.1  33.1  32.7   Platelets 150 - 400 K/uL 113  121  127    Lipid Panel  No results found for: "CHOL", "TRIG", "HDL", "CHOLHDL", "VLDL", "LDLCALC", "LDLDIRECT" HEMOGLOBIN A1C Lab Results  Component Value Date   HGBA1C 7.0 (H) 06/05/2022   MPG 154.2 06/05/2022   TSH Recent Labs    06/05/22 0515  TSH 0.789   Medications   Current Outpatient Medications:    amiodarone (PACERONE) 200 MG tablet, Take 1 tablet (200 mg total) by mouth daily., Disp: 90 tablet, Rfl: 1   apixaban (ELIQUIS) 5 MG TABS tablet, Take 1 tablet (5 mg total) by mouth 2 (two) times daily., Disp: 60 tablet, Rfl: 0   Ascorbic Acid (VITAMIN C) 1000 MG tablet, Take 1,000 mg by mouth daily., Disp: , Rfl:    carboxymethylcellulose (REFRESH PLUS) 0.5 % SOLN, Place 1 drop into both eyes daily as needed (dry eyes)., Disp: , Rfl:    cetirizine (ZYRTEC) 10 MG tablet, Take 10 mg by mouth daily as needed for allergies., Disp: , Rfl:    cyanocobalamin (VITAMIN B12) 1000 MCG tablet, Take 1,000 mcg by mouth daily., Disp: , Rfl:    docusate sodium (COLACE) 100 MG capsule, Take 100 mg by mouth daily. , Disp: , Rfl:    esomeprazole (NEXIUM) 20 MG capsule, Take 20 mg by mouth daily at 12 noon., Disp: , Rfl:    FARXIGA 10 MG TABS tablet, Take 10 mg by mouth daily., Disp: , Rfl:    fluticasone (FLONASE) 50 MCG/ACT nasal spray, Place 1 spray into both nostrils daily as needed for allergies.,  Disp: , Rfl:    glimepiride (AMARYL) 2 MG tablet, Take 2 mg by mouth daily with breakfast., Disp: , Rfl:    metFORMIN (GLUCOPHAGE) 1000 MG tablet, Take 500 mg by mouth 2 (two) times daily. , Disp: , Rfl:    midodrine  (PROAMATINE) 5 MG tablet, Take 1 tablet (5 mg total) by mouth 3 (three) times daily with meals. While awake, Disp: 90 tablet, Rfl: 1   rosuvastatin (CRESTOR) 5 MG tablet, Take 5 mg by mouth daily., Disp: , Rfl:    VITAMIN D PO, Take 1,000 Units by mouth daily., Disp: , Rfl:    Zinc 50 MG TABS, Take 50 mg by mouth daily., Disp: , Rfl:    albuterol (VENTOLIN HFA) 108 (90 Base) MCG/ACT inhaler, Inhale 1 puff into the lungs every 4 (four) hours as needed for wheezing or shortness of breath. (Patient not taking: Reported on 06/19/2022), Disp: , Rfl:    Glucose Blood (IGLUCOSE TEST STRIPS VI), by In Vitro route., Disp: , Rfl:    Lancets MISC, by Does not apply route., Disp: , Rfl:    tobramycin (TOBREX) 0.3 % ophthalmic solution, Place 1 drop into both eyes every 4 (four) hours., Disp: , Rfl:    Cardiac Studies:   Coronary Angiography 2017: In [place], [place]: No significant CAD.   Event Monitor for 30 days Start date  12/02/2018 - 12/31/2018: Baseline sample showed Sinus Rhythm with a heart rate of 82.7 bpm. There were 0 critical, 0 serious, and 3 stable events that occurred.  Automatically Detected Events: 1 Stable: Sinus Rhythm w/Run of V-Tach (5 Beats) at 4 pm. Manually Detected Events: 1 Stable: Sinus Rhythm w/1st Degree AV Block/Artifact  Echocardiogram 06/05/2022:  1. Left ventricular ejection fraction, by estimation, is 60 to 65%. The left ventricle has normal function. The left ventricle has no regional wall motion abnormalities. Left ventricular diastolic parameters are indeterminate.  2. Right ventricular systolic function is normal. The right ventricular size is normal. There is normal pulmonary artery systolic pressure. The estimated right ventricular systolic pressure is 123XX123 mmHg.  3. The mitral valve is grossly normal. Trivial mitral valve regurgitation. No evidence of mitral stenosis.  4. The aortic valve is tricuspid. Aortic valve regurgitation is trivial. Aortic valve sclerosis is  present, with no evidence of aortic valve stenosis.  5. Aortic dilatation noted. There is borderline dilatation of the aortic root, measuring 38 mm.  6. The inferior vena cava is dilated in size with >50% respiratory variability, suggesting right atrial pressure of 8 mmHg.  EKG:   EKG 06/19/2022: Normal sinus rhythm at rate of 65 bpm, left atrial enlargement, left axis deviation, left anterior fascicular block.  IVCD, borderline criteria for LVH.  No significant change from 12/02/2018.  EKG1/15/2024 at 1429 hrs.: Normal sinus rhythm at rate of 75 bpm, IVCD, no evidence of ischemia.  Compared to 07: 39 hours EKG, atrial fibrillation with rapid ventricular sponsor not present.  Assessment     ICD-10-CM   1. PAF (paroxysmal atrial fibrillation) (HCC)  I48.0 EKG 12-Lead    amiodarone (PACERONE) 200 MG tablet    2. Primary hypertension  I10     3. Orthostatic hypotension  I95.1 midodrine (PROAMATINE) 5 MG tablet    4. Chronic kidney disease, stage 3a (HCC)  N18.31      CHA2DS2-VASc Score is 4.  Yearly risk of stroke: 5% (A, HTN, DM).  Score of 1=0.6; 2=2.2; 3=3.2; 4=4.8; 5=7.2; 6=9.8; 7=>9.8) -(CHF; HTN; vasc disease DM,  Male =  1; Age <65 =0; 65-74 = 1,  >75 =2; stroke/embolism= 2).   Recommendations:   DAYLE BAGSHAW is a 82 y.o. Caucasian male with longstanding history of diabetes mellitus with stage 3 CKD, hyperlipidemia, hypertension, palpitations and near syncopal spells due to severe orthostatic hypotension.  He is now compliant with support hose use.    Admitted to the hospital on 06/04/2022 when he presented with marked generalized weakness, hypotension, dehydration and also new onset A-fib with RVR, was started on amiodarone but converted to sinus rhythm within a few hours.  He was eventually discharged home on amiodarone and anticoagulation.  Readmitted 1 day later on 06/06/2022 due to feeling poorly and positive blood culture with Enterococcus faecalis and he just completed 3 weeks  of antibiotic therapy.  TTE was negative for endocarditis.  1. PAF (paroxysmal atrial fibrillation) ([place]) Patient has had 1 episode of atrial fibrillation with RVR, converted to sinus rhythm spontaneously but was also started on amiodarone later and has maintained sinus rhythm.  He was on a tapering dose of amiodarone orally, will reduce amiodarone 2 mg twice daily to 200 daily and probably use only 100 mg daily.  2.  Supine hypertension Patient has severe orthostatic hypotension and supine hypertension.  Today he is normotensive supine, hypotensive on standing, he is not on any antihypertensive medications.  Wilder Glade could be reduced to 5 mg daily to reduce the risk of dehydration and worsening hypotension.  3. Orthostatic hypotension Advised him to go back to wearing support stockings like he used to before, added midodrine 5 mg 3 times daily while awake and clear-cut instructions given to the patient and his wife and also advised him to monitor orthostatic blood pressure at home.  4. Chronic kidney disease, stage 3a ([place]) Patient's serum creatinine has remained stable and EGFR is remained stable as well.  I would like to see him back in 4 to 6 weeks for follow-up.  This was a 40-minute office visit encounter in review of his external records and medical records from the hospital and labs.     Adrian Prows, MD, [facility] 06/29/2022, 2:50 PM Office: [phone] Fax: [phone] Pager: [phone]

## 2022-06-28 ENCOUNTER — Telehealth: Payer: Self-pay

## 2022-06-28 NOTE — Telephone Encounter (Signed)
Patients wife called about patient having low BP's, feeling weak and having cold sweats at night.   Orthostatic BP  165/83 HR 60 supine 112/65 HR 69 sitting   64/42 HR 70 standing  She did mention that he is only taking his blood thinner once a day, not twice, as advised.

## 2022-06-28 NOTE — Telephone Encounter (Signed)
Forward request to PCP if he is agreement with this and if patient is finding relief with osteo arthritis

## 2022-06-29 ENCOUNTER — Encounter: Payer: Self-pay | Admitting: Cardiology

## 2022-06-29 DIAGNOSIS — I480 Paroxysmal atrial fibrillation: Secondary | ICD-10-CM

## 2022-06-29 NOTE — Telephone Encounter (Signed)
Sorry that message for another patient. He can increase the midodrine to 10 mg three times daily, He will have orthostatics and needs to increase fluid intake and take the medications during awake hours. You can send another Rx

## 2022-06-29 NOTE — Telephone Encounter (Signed)
Patient's wife says patient does not have osteo arthritis, that she knows of. Please clarify. [phone]

## 2022-07-02 NOTE — Telephone Encounter (Signed)
From patient

## 2022-07-03 ENCOUNTER — Telehealth: Payer: Self-pay

## 2022-07-03 NOTE — Telephone Encounter (Signed)
Patients daughter is saying they will try the increase of midodrine, but that she is very concerned we dont understand the severity of the patients symptoms. She says he is so weak and dizzy that he is passing out and collapsing in the middle of the night when he gets up to go to the bathroom. She says that he can't get himself dressed and out of the house in the mornings or throughout the day.

## 2022-07-03 NOTE — Telephone Encounter (Signed)
From patient

## 2022-07-05 ENCOUNTER — Encounter: Payer: Self-pay | Admitting: Cardiology

## 2022-07-05 NOTE — Telephone Encounter (Signed)
From patient

## 2022-07-06 ENCOUNTER — Other Ambulatory Visit: Payer: Self-pay

## 2022-07-06 DIAGNOSIS — I951 Orthostatic hypotension: Secondary | ICD-10-CM

## 2022-07-06 DIAGNOSIS — I480 Paroxysmal atrial fibrillation: Secondary | ICD-10-CM

## 2022-07-06 MED ORDER — MIDODRINE HCL 5 MG PO TABS: 5.0000 mg | ORAL_TABLET | Freq: Three times a day (TID) | ORAL | 1 refills | Status: DC

## 2022-07-06 MED ORDER — APIXABAN 5 MG PO TABS: 5.0000 mg | ORAL_TABLET | Freq: Two times a day (BID) | ORAL | 1 refills | Status: DC

## 2022-07-06 NOTE — Telephone Encounter (Signed)
ICD-10-CM   1. PAF (paroxysmal atrial fibrillation) (HCC)  I48.0 apixaban (ELIQUIS) 5 MG TABS tablet     Meds ordered this encounter  Medications   apixaban (ELIQUIS) 5 MG TABS tablet    Sig: Take 1 tablet (5 mg total) by mouth 2 (two) times daily.    Dispense:  180 tablet    Refill:  1    Medications Discontinued During This Encounter  Medication Reason   apixaban (ELIQUIS) 5 MG TABS tablet Reorder

## 2022-07-06 NOTE — Telephone Encounter (Signed)
From patient

## 2022-07-10 ENCOUNTER — Other Ambulatory Visit: Payer: Self-pay

## 2022-07-10 ENCOUNTER — Encounter: Payer: Self-pay | Admitting: Hematology

## 2022-07-10 DIAGNOSIS — C9110 Chronic lymphocytic leukemia of B-cell type not having achieved remission: Secondary | ICD-10-CM

## 2022-07-10 MED ORDER — MIDODRINE HCL 10 MG PO TABS: 10.0000 mg | ORAL_TABLET | Freq: Three times a day (TID) | ORAL | 3 refills | Status: DC

## 2022-07-10 MED ORDER — MIDODRINE HCL 5 MG PO TABS: 10.0000 mg | ORAL_TABLET | Freq: Three times a day (TID) | ORAL | 3 refills | Status: DC

## 2022-07-11 ENCOUNTER — Inpatient Hospital Stay (HOSPITAL_BASED_OUTPATIENT_CLINIC_OR_DEPARTMENT_OTHER): Payer: Medicare Other | Admitting: Hematology

## 2022-07-11 ENCOUNTER — Inpatient Hospital Stay: Payer: Medicare Other | Attending: Hematology

## 2022-07-11 ENCOUNTER — Other Ambulatory Visit: Payer: Self-pay

## 2022-07-11 VITALS — BP 128/66 | HR 66 | Temp 97.7°F | Resp 18 | Wt 165.4 lb

## 2022-07-11 DIAGNOSIS — C9110 Chronic lymphocytic leukemia of B-cell type not having achieved remission: Secondary | ICD-10-CM | POA: Insufficient documentation

## 2022-07-11 DIAGNOSIS — R300 Dysuria: Secondary | ICD-10-CM | POA: Diagnosis not present

## 2022-07-11 DIAGNOSIS — E785 Hyperlipidemia, unspecified: Secondary | ICD-10-CM | POA: Insufficient documentation

## 2022-07-11 DIAGNOSIS — N183 Chronic kidney disease, stage 3 unspecified: Secondary | ICD-10-CM | POA: Insufficient documentation

## 2022-07-11 DIAGNOSIS — I129 Hypertensive chronic kidney disease with stage 1 through stage 4 chronic kidney disease, or unspecified chronic kidney disease: Secondary | ICD-10-CM | POA: Insufficient documentation

## 2022-07-11 DIAGNOSIS — K219 Gastro-esophageal reflux disease without esophagitis: Secondary | ICD-10-CM | POA: Insufficient documentation

## 2022-07-11 DIAGNOSIS — R309 Painful micturition, unspecified: Secondary | ICD-10-CM | POA: Diagnosis not present

## 2022-07-11 DIAGNOSIS — E1122 Type 2 diabetes mellitus with diabetic chronic kidney disease: Secondary | ICD-10-CM | POA: Insufficient documentation

## 2022-07-11 DIAGNOSIS — Z7901 Long term (current) use of anticoagulants: Secondary | ICD-10-CM | POA: Diagnosis not present

## 2022-07-11 DIAGNOSIS — I4891 Unspecified atrial fibrillation: Secondary | ICD-10-CM | POA: Diagnosis not present

## 2022-07-11 LAB — CMP (CANCER CENTER ONLY)
ALT: 14 U/L (ref 0–44)
AST: 13 U/L — ABNORMAL LOW (ref 15–41)
Albumin: 4.4 g/dL (ref 3.5–5.0)
Alkaline Phosphatase: 92 U/L (ref 38–126)
Anion gap: 8 (ref 5–15)
BUN: 22 mg/dL (ref 8–23)
CO2: 26 mmol/L (ref 22–32)
Calcium: 9.4 mg/dL (ref 8.9–10.3)
Chloride: 102 mmol/L (ref 98–111)
Creatinine: 1.69 mg/dL — ABNORMAL HIGH (ref 0.61–1.24)
GFR, Estimated: 41 mL/min — ABNORMAL LOW (ref >60)
Glucose, Bld: 149 mg/dL — ABNORMAL HIGH (ref 70–99)
Potassium: 3.9 mmol/L (ref 3.5–5.1)
Sodium: 136 mmol/L (ref 135–145)
Total Bilirubin: 0.4 mg/dL (ref 0.3–1.2)
Total Protein: 6.6 g/dL (ref 6.5–8.1)

## 2022-07-11 LAB — URINALYSIS, COMPLETE (UACMP) WITH MICROSCOPIC
Bacteria, UA: NONE SEEN
Bilirubin Urine: NEGATIVE
Glucose, UA: >=500 mg/dL — AB
Hgb urine dipstick: NEGATIVE
Ketones, ur: NEGATIVE mg/dL
Leukocytes,Ua: NEGATIVE
Nitrite: NEGATIVE
Protein, ur: NEGATIVE mg/dL
Specific Gravity, Urine: 1.021 (ref 1.005–1.030)
pH: 5 (ref 5.0–8.0)

## 2022-07-11 LAB — CBC WITH DIFFERENTIAL (CANCER CENTER ONLY)
Abs Immature Granulocytes: 0.02 10*3/uL (ref 0.00–0.07)
Basophils Absolute: 0 10*3/uL (ref 0.0–0.1)
Basophils Relative: 0 %
Eosinophils Absolute: 0 10*3/uL (ref 0.0–0.5)
Eosinophils Relative: 0 %
HCT: 35.6 % — ABNORMAL LOW (ref 39.0–52.0)
Hemoglobin: 11.7 g/dL — ABNORMAL LOW (ref 13.0–17.0)
Immature Granulocytes: 0 %
Lymphocytes Relative: 92 %
Lymphs Abs: 46.5 10*3/uL — ABNORMAL HIGH (ref 0.7–4.0)
MCH: 32.1 pg (ref 26.0–34.0)
MCHC: 32.9 g/dL (ref 30.0–36.0)
MCV: 97.5 fL (ref 80.0–100.0)
Monocytes Absolute: 2.6 10*3/uL — ABNORMAL HIGH (ref 0.1–1.0)
Monocytes Relative: 5 %
Neutro Abs: 1.3 10*3/uL — ABNORMAL LOW (ref 1.7–7.7)
Neutrophils Relative %: 3 %
Platelet Count: 111 10*3/uL — ABNORMAL LOW (ref 150–400)
RBC: 3.65 MIL/uL — ABNORMAL LOW (ref 4.22–5.81)
RDW: 15.2 % (ref 11.5–15.5)
Smear Review: NORMAL
WBC Count: 50.5 10*3/uL (ref 4.0–10.5)
nRBC: 0 % (ref 0.0–0.2)

## 2022-07-11 LAB — LACTATE DEHYDROGENASE: LDH: 109 U/L (ref 98–192)

## 2022-07-11 NOTE — Progress Notes (Signed)
HEMATOLOGY ONCOLOGY CLINIC NOTE  Date of Service:  07/11/22    Patient Care Team: [name], MD as PCP - General (Family Medicine) Brunetta Genera, MD as Consulting Physician (Hematology)  CHIEF COMPLAINTS/PURPOSE OF CONSULTATION:  Continued evaluation and management of CLL  HISTORY OF PRESENTING ILLNESS:   Eric Harrington 82 y.o. male is here because of a referral from Dr. Inda Castle from [place] at [facility] regarding a trend in his elevated WBC.   He is accompanied today by his wife of 29 years. The pt reports that he is doing well overall. He recently had a biopsy to check his prostate at [facility] with Dr. Aleen Campi and they took 12 core samples with reportedly no significant findings.  Of note prior to the patient's visit today, pt has had CBC completed on 04/26/17 with results revealing WBC at 14.7, Hgb at 12.6, Lymph Abs at 11.0 with all other values WNL. On 03/26/17 his WBC were 12.8, Hgb at 13.3 and Lymph's at  9.90. On 02/06/17 his WBC were 12.7, Hgb at 13.4, and Lymph's at 8.80.   On review of systems, pt reports post nasal drip, persistent cough, occasional troublesome stomach, pain in his lower abdomen that feels like his muscles are irritated, reports he has lost 25 lbs in the last 2.5 years (he associates a decreased appetite after taking beta blockers during this period) and denies no recent colds or infections, acute changes in energy levels, and leg swelling.   On PMHx the pt reports taking Zetia. He has IB'S and has had 3 colonoscopies with no significant findings or inflammatory processes. He reports that over the last 2.5 years he had heart catheterizations that all turned out well. He reports having diabetes. He also reports neuropathy along his whole right side that lasted for a few months that ceased abruptly.   Interval History:   Eric Harrington is a wonderful gentleman who is here for continued evaluation and management of CLL.  Patient was last  seen by me on 01/10/2022 and he was doing well overall.   Patient is accompanied by his wife during this visit. Patient notes he has been doing fairly well since our last visit. He notes he was recently admitted to the ED on 06/06/2022 for evaluation of positive blood culture which was drawn on 06/05/2022.   He was also admitted to the hospital from 06/04/2022 to 06/05/2022 for atrial fibrillation with RVR. He was started on oral amiodarone and Eliquis. He is currently following up with his cardiologist regarding his atrial fibrillation.   Patient thinks he has UTI and is requesting urinary test during this visit. He has been having burning sensation and urinary discomfort for the past 3 weeks. He denies any urine color changes, hematuria, or fever. He denies any previous history of prostate problem/cancer. He reports he had kidney stone when he was around 82 years old. He denies any skin rashes near his groin.   Patient's wife notes he had viral infection in December 2023. He has been having increased fatigue since the viral infection.   His physician has reduced his Farxiga to 5 mg from 10 mg.   He has been drinking more water than before, around 48 oz. He has been eating well as well.   Patient notes that his heart rates has been around 60-70 bpm and he has been running low blood pressure at home. During this visit, his blood pressure was 128/66.  He denies fever, chills, night  sweats, abdominal pain, chest pain, shortness of breath, or leg swelling. Patient does complain of increased sweating since our last visit. Patient's wife notes that his weight is slowly improving since getting discharged from the ED last month in January 2024. Patient denies home nurse care.   MEDICAL HISTORY:  1. HTN 2. DM2 3. H/o PAC's 4. transient a fib with cardiac cath 5. HLD 6. CKD stage 3 7. Irritable bowel syndrome 8. GERD 9. H/o presyndope  SURGICAL HISTORY:  1. Prostate bx 2. Cardiac  cath  SOCIAL HISTORY: Social History   Socioeconomic History   Marital status: Married    Spouse name: Not on file   Number of children: 2   Years of education: Not on file   Highest education level: Not on file  Occupational History   Not on file  Tobacco Use   Smoking status: Former    Packs/day: 1.00    Years: 30.00    Total pack years: 30.00    Types: Cigarettes    Quit date: 48    Years since quitting: 44.1   Smokeless tobacco: Never   Tobacco comments:    started at age 79  Vaping Use   Vaping Use: Never used  Substance and Sexual Activity   Alcohol use: Never   Drug use: Not Currently   Sexual activity: Not Currently    Birth control/protection: None  Other Topics Concern   Not on file  Social History Narrative   Not on file   Social Determinants of Health   Financial Resource Strain: Not on file  Food Insecurity: No Food Insecurity (06/05/2022)   Hunger Vital Sign    Worried About Running Out of Food in the Last Year: Never true    Ran Out of Food in the Last Year: Never true  Transportation Needs: No Transportation Needs (06/05/2022)   PRAPARE - Hydrologist (Medical): No    Lack of Transportation (Non-Medical): No  Physical Activity: Not on file  Stress: Not on file  Social Connections: Not on file  Intimate Partner Violence: Not At Risk (06/05/2022)   Humiliation, Afraid, Rape, and Kick questionnaire    Fear of Current or Ex-Partner: No    Emotionally Abused: No    Physically Abused: No    Sexually Abused: No    FAMILY HISTORY: Family History  Problem Relation Age of Onset   Lung cancer Mother    Heart attack Father    Dementia Sister    Diabetes Brother    Diabetes Sister    Heart attack Brother     ALLERGIES:  is allergic to amlodipine besylate, bystolic [nebivolol hcl], cephalexin, doxycycline, gemfibrozil, glipizide, januvia [sitagliptin], lipitor [atorvastatin calcium], lisinopril, losartan, metoprolol,  omeprazole, simvastatin, and gabapentin.  MEDICATIONS:  Current Outpatient Medications  Medication Sig Dispense Refill   albuterol (VENTOLIN HFA) 108 (90 Base) MCG/ACT inhaler Inhale 1 puff into the lungs every 4 (four) hours as needed for wheezing or shortness of breath. (Patient not taking: Reported on 06/19/2022)     amiodarone (PACERONE) 200 MG tablet Take 1 tablet (200 mg total) by mouth daily. 90 tablet 1   apixaban (ELIQUIS) 5 MG TABS tablet Take 1 tablet (5 mg total) by mouth 2 (two) times daily. 180 tablet 1   Ascorbic Acid (VITAMIN C) 1000 MG tablet Take 1,000 mg by mouth daily.     carboxymethylcellulose (REFRESH PLUS) 0.5 % SOLN Place 1 drop into both eyes daily as needed (dry eyes).  cetirizine (ZYRTEC) 10 MG tablet Take 10 mg by mouth daily as needed for allergies.     cyanocobalamin (VITAMIN B12) 1000 MCG tablet Take 1,000 mcg by mouth daily.     docusate sodium (COLACE) 100 MG capsule Take 100 mg by mouth daily.      esomeprazole (NEXIUM) 20 MG capsule Take 20 mg by mouth daily at 12 noon.     FARXIGA 10 MG TABS tablet Take 10 mg by mouth daily.     fluticasone (FLONASE) 50 MCG/ACT nasal spray Place 1 spray into both nostrils daily as needed for allergies.     glimepiride (AMARYL) 2 MG tablet Take 2 mg by mouth daily with breakfast.     Glucose Blood (IGLUCOSE TEST STRIPS VI) by In Vitro route.     Lancets MISC by Does not apply route.     metFORMIN (GLUCOPHAGE) 1000 MG tablet Take 500 mg by mouth 2 (two) times daily.      midodrine (PROAMATINE) 5 MG tablet Take 2 tablets (10 mg total) by mouth 3 (three) times daily. 540 tablet 3   rosuvastatin (CRESTOR) 5 MG tablet Take 5 mg by mouth daily.     tobramycin (TOBREX) 0.3 % ophthalmic solution Place 1 drop into both eyes every 4 (four) hours.     VITAMIN D PO Take 1,000 Units by mouth daily.     Zinc 50 MG TABS Take 50 mg by mouth daily.     No current facility-administered medications for this visit.    REVIEW OF SYSTEMS:    10 Point review of Systems was done is negative except as noted above.   PHYSICAL EXAMINATION:  Vitals:   07/11/22 1242  BP: 128/66  Pulse: 66  Resp: 18  Temp: 97.7 F (36.5 C)  SpO2: 100%     Filed Weights   07/11/22 1242  Weight: 165 lb 6.4 oz (75 kg)   NAD GENERAL:alert, in no acute distress and comfortable SKIN: no acute rashes, no significant lesions EYES: conjunctiva are pink and non-injected, sclera anicteric OROPHARYNX: MMM, no exudates, no oropharyngeal erythema or ulceration NECK: supple, no JVD LYMPH:  no palpable lymphadenopathy in the cervical, axillary or inguinal regions LUNGS: clear to auscultation b/l with normal respiratory effort HEART: regular rate & rhythm ABDOMEN:  normoactive bowel sounds , non tender, not distended.  No palpable splenomegaly noted. Extremity: no pedal edema PSYCH: alert & oriented x 3 with fluent speech NEURO: no focal motor/sensory deficits LABORATORY DATA:   I have reviewed the data as listed  .    Latest Ref Rng & Units 07/11/2022   12:10 PM 06/09/2022    3:47 AM 06/08/2022    4:19 AM  CBC  WBC 4.0 - 10.5 K/uL 50.5  47.6  48.9   Hemoglobin 13.0 - 17.0 g/dL 11.7  10.6  10.9   Hematocrit 39.0 - 52.0 % 35.6  32.1  33.1   Platelets 150 - 400 K/uL 111  113  121     . CBC    Component Value Date/Time   WBC 50.5 (HH) 07/11/2022 1210   WBC 47.6 (H) 06/09/2022 0347   RBC 3.65 (L) 07/11/2022 1210   HGB 11.7 (L) 07/11/2022 1210   HCT 35.6 (L) 07/11/2022 1210   PLT 111 (L) 07/11/2022 1210   MCV 97.5 07/11/2022 1210   MCH 32.1 07/11/2022 1210   MCHC 32.9 07/11/2022 1210   RDW 15.2 07/11/2022 1210   LYMPHSABS 46.5 (H) 07/11/2022 1210   MONOABS 2.6 (H)  07/11/2022 1210   EOSABS 0.0 07/11/2022 1210   BASOSABS 0.0 07/11/2022 1210   .Marland Kitchen Lab Results  Component Value Date   RETICCTPCT 1.4 04/24/2018   RBC 3.65 (L) 07/11/2022       Latest Ref Rng & Units 07/11/2022   12:10 PM 06/09/2022    3:47 AM 06/08/2022    4:19 AM   CMP  Glucose 70 - 99 mg/dL 149  192  161   BUN 8 - 23 mg/dL '22  18  15   '$ Creatinine 0.61 - 1.24 mg/dL 1.69  1.54  1.52   Sodium 135 - 145 mmol/L 136  133  133   Potassium 3.5 - 5.1 mmol/L 3.9  4.1  4.3   Chloride 98 - 111 mmol/L 102  102  103   CO2 22 - 32 mmol/L '26  24  23   '$ Calcium 8.9 - 10.3 mg/dL 9.4  8.6  8.7   Total Protein 6.5 - 8.1 g/dL 6.6  5.6  5.5   Total Bilirubin 0.3 - 1.2 mg/dL 0.4  0.4  0.3   Alkaline Phos 38 - 126 U/L 92  85  89   AST 15 - 41 U/L '13  14  12   '$ ALT 0 - 44 U/L '14  13  11    '$ . Lab Results  Component Value Date   LDH 109 07/11/2022   Component     Latest Ref Rng & Units 06/13/2017  HCV Ab     0.0 - 0.9 s/co ratio <0.1  Hepatitis B Surface Ag     Negative Negative  Hep B Core Ab, Tot     Negative Negative         RADIOGRAPHIC STUDIES: I have personally reviewed the radiological images as listed and agreed with the findings in the report. No results found.  ASSESSMENT & PLAN:   82 y.o. is a male with  1. Chronic lymphocytic leukemia. Likely Rai Stage 0, CLL FISH panel with 13 q. deletion  -he presented with Lymphocytosis incidentally noted on routine labs No associated significant anemia or thrombocytopenia. No constitutional symptoms No overt clinically palpable LNadenopathy or hepato-splenomegaly. 13q mutation present   08/28/17 CT C/A/P revealed Normal sized spleen, no enlarged lymph nodes. There is evidence of very small lung nodule, likely related to inflammatory process. Will monitor with CT chest in 12 months   PLAN: -Discussed lab results from today, 07/11/2022, with the patient. CBC shows WBC of 50.5, hemoglobin of 11.7, hematocrit of 11.7, and platelets of 111. CMP shows elevated creatinine of 1.69. -Discussed that Wilder Glade can cause UTI and yeast infection.  -Recommended to follow up with PCP and Cardiologist regarding his medicines.  -Recommended to stay hydrated.   -Patient has no clear lab or clinical evidence of needing  treatment for his CLL at this time. -Continue age-appropriate vaccinations and follow-up. -Patient will get urinary test during this visit.   FOLLOW-UP: UA/Ucx today RTC with Dr Irene Limbo with labs in 6 months  The total time spent in the appointment was 30 minutes* .  All of the patient's questions were answered with apparent satisfaction. The patient knows to call the clinic with any problems, questions or concerns.   Sullivan Lone MD MS AAHIVMS [facility] Hematology/Oncology Physician [facility]  .*Total Encounter Time as defined by the Centers for Medicare and Medicaid Services includes, in addition to the face-to-face time of a patient visit (documented in the note above) non-face-to-face time: obtaining and reviewing  outside history, ordering and reviewing medications, tests or procedures, care coordination (communications with other health care professionals or caregivers) and documentation in the medical record.   I, Cleda Mccreedy, am acting as a Education administrator for Sullivan Lone, MD.  .I have reviewed the above documentation for accuracy and completeness, and I agree with the above. Brunetta Genera MD    Addendum  UA/Ucx -- no evidence of UTI

## 2022-07-12 LAB — URINE CULTURE: Culture: NO GROWTH

## 2022-07-13 ENCOUNTER — Other Ambulatory Visit: Payer: Self-pay

## 2022-07-13 DIAGNOSIS — I480 Paroxysmal atrial fibrillation: Secondary | ICD-10-CM

## 2022-07-13 MED ORDER — AMIODARONE HCL 200 MG PO TABS: 200.0000 mg | ORAL_TABLET | Freq: Every day | ORAL | 1 refills | Status: DC

## 2022-07-31 ENCOUNTER — Encounter: Payer: Self-pay | Admitting: Cardiology

## 2022-07-31 ENCOUNTER — Ambulatory Visit: Payer: Medicare Other | Admitting: Cardiology

## 2022-07-31 VITALS — BP 119/66 | HR 66 | Ht 70.0 in | Wt 168.2 lb

## 2022-07-31 DIAGNOSIS — I480 Paroxysmal atrial fibrillation: Secondary | ICD-10-CM

## 2022-07-31 DIAGNOSIS — I951 Orthostatic hypotension: Secondary | ICD-10-CM

## 2022-07-31 DIAGNOSIS — I10 Essential (primary) hypertension: Secondary | ICD-10-CM

## 2022-07-31 MED ORDER — MIDODRINE HCL 10 MG PO TABS: 10.0000 mg | ORAL_TABLET | Freq: Three times a day (TID) | ORAL | 3 refills | Status: DC

## 2022-07-31 NOTE — Progress Notes (Signed)
Primary Physician/Referring:  [name], MD  Patient ID: Eric Harrington, male    DOB: February 03, 1941, 82 y.o.   MRN: [id number]  Subjective   Chief Complaint  Patient presents with   Atrial Fibrillation   orthostatic hypotension    HPI: Eric Harrington  is a 82 y.o. Caucasian male with longstanding history of diabetes mellitus with stage 3 CKD, hyperlipidemia, hypertension, palpitations and near syncopal spells due to severe orthostatic hypotension.  He is now compliant with support hose use.    Admitted to the hospital on 06/04/2022 when he presented with marked generalized weakness, hypotension, dehydration and also new onset A-fib with RVR, was started on amiodarone but converted to sinus rhythm within a few hours.  He was eventually discharged home on amiodarone and anticoagulation.  Readmitted 1 day later on 06/06/2022 due to feeling poorly and positive blood culture with Enterococcus faecalis and he just completed 3 weeks of antibiotic therapy.  TTE was negative for endocarditis.  This is a 6-week office visit, patient states that he is feeling remarkably well since placing him on midodrine.  He ran out of amiodarone and there were no refills, hence he discontinued this almost a month ago.  Wife is present.  Past Medical History:  Diagnosis Date   Chronic Leukemia    Diabetes mellitus without complication ([place])    Hyperlipemia 12/02/2018   Hyperlipidemia    Hypertension    Type 2 diabetes mellitus with complication, without long-term current use of insulin ([place]) 12/02/2018    Past Surgical History:  Procedure Laterality Date   CARDIAC CATHETERIZATION  2017   Social History   Tobacco Use   Smoking status: Former    Packs/day: 1.00    Years: 30.00    Total pack years: 30.00    Types: Cigarettes    Quit date: 1980    Years since quitting: 44.2   Smokeless tobacco: Never   Tobacco comments:    started at age 40  Substance Use Topics   Alcohol use: Never    Marital  Status: Married   Review of Systems  Cardiovascular:  Negative for chest pain, dyspnea on exertion and leg swelling.  Neurological:  Positive for dizziness and loss of balance.   Objective      07/31/2022    2:00 PM 07/11/2022   12:42 PM 06/19/2022    2:57 PM  Vitals with BMI  Height '5\' 10"'$   '5\' 10"'$   Weight 168 lbs 3 oz 165 lbs 6 oz 164 lbs  BMI [id number]  0000000  Systolic [id number] 0000000 [id number]  Diastolic 66 66 62  Pulse 66 66 70   Today's Vitals   07/31/22 1400 07/31/22 1406 07/31/22 1408 07/31/22 1409  BP: 119/66     Pulse: 66     SpO2: 97% 98% 97% 98%  Weight: 168 lb 3.2 oz (76.3 kg)     Height: '5\' 10"'$  (1.778 m)      Body mass index is 24.13 kg/m.  Orthostatic VS for the past 72 hrs (Last 3 readings):  Orthostatic BP Patient Position BP Location Cuff Size Orthostatic Pulse  07/31/22 1409 100/50 Standing Left Arm Small 68  07/31/22 1408 110/70 Sitting Left Arm Small 64  07/31/22 1406 150/70 Supine Left Arm Small 58     Physical Exam Neck:     Vascular: No carotid bruit or JVD.  Cardiovascular:     Rate and Rhythm: Normal rate and regular rhythm.     Pulses: Normal pulses and  intact distal pulses.     Heart sounds: Normal heart sounds. No murmur heard.    No gallop.  Pulmonary:     Effort: Pulmonary effort is normal.     Breath sounds: Normal breath sounds.  Abdominal:     General: Bowel sounds are normal.     Palpations: Abdomen is soft.  Musculoskeletal:     Right lower leg: No edema.     Left lower leg: No edema.    Radiology: No results found.  Laboratory examination:   Lab Results  Component Value Date   NA 136 07/11/2022   K 3.9 07/11/2022   CO2 26 07/11/2022   GLUCOSE 149 (H) 07/11/2022   BUN 22 07/11/2022   CREATININE 1.69 (H) 07/11/2022   CALCIUM 9.4 07/11/2022   GFRNONAA 41 (L) 07/11/2022       Latest Ref Rng & Units 07/11/2022   12:10 PM 06/09/2022    3:47 AM 06/08/2022    4:19 AM  CMP  Glucose 70 - 99 mg/dL 149  192  161   BUN 8 - 23 mg/dL '22   18  15   '$ Creatinine 0.61 - 1.24 mg/dL 1.69  1.54  1.52   Sodium 135 - 145 mmol/L 136  133  133   Potassium 3.5 - 5.1 mmol/L 3.9  4.1  4.3   Chloride 98 - 111 mmol/L 102  102  103   CO2 22 - 32 mmol/L '26  24  23   '$ Calcium 8.9 - 10.3 mg/dL 9.4  8.6  8.7   Total Protein 6.5 - 8.1 g/dL 6.6  5.6  5.5   Total Bilirubin 0.3 - 1.2 mg/dL 0.4  0.4  0.3   Alkaline Phos 38 - 126 U/L 92  85  89   AST 15 - 41 U/L '13  14  12   '$ ALT 0 - 44 U/L '14  13  11       '$ Latest Ref Rng & Units 07/11/2022   12:10 PM 06/09/2022    3:47 AM 06/08/2022    4:19 AM  CBC  WBC 4.0 - 10.5 K/uL 50.5  47.6  48.9   Hemoglobin 13.0 - 17.0 g/dL 11.7  10.6  10.9   Hematocrit 39.0 - 52.0 % 35.6  32.1  33.1   Platelets 150 - 400 K/uL 111  113  121    HEMOGLOBIN A1C Lab Results  Component Value Date   HGBA1C 7.0 (H) 06/05/2022   MPG 154.2 06/05/2022   TSH Recent Labs    06/05/22 0515  TSH 0.789   External labs:  Cholesterol, total 134.000 m 07/28/2021 HDL 27.000 mg 07/28/2021 LDL 79.000 mg 07/28/2021 Triglycerides 157.000 m 07/28/2021  A1C 7.000 % 06/05/2022  Medications   Current Outpatient Medications:    albuterol (VENTOLIN HFA) 108 (90 Base) MCG/ACT inhaler, Inhale 1 puff into the lungs every 4 (four) hours as needed for wheezing or shortness of breath., Disp: , Rfl:    apixaban (ELIQUIS) 5 MG TABS tablet, Take 1 tablet (5 mg total) by mouth 2 (two) times daily., Disp: 180 tablet, Rfl: 1   Ascorbic Acid (VITAMIN C) 1000 MG tablet, Take 1,000 mg by mouth daily., Disp: , Rfl:    carboxymethylcellulose (REFRESH PLUS) 0.5 % SOLN, Place 1 drop into both eyes daily as needed (dry eyes)., Disp: , Rfl:    cetirizine (ZYRTEC) 10 MG tablet, Take 10 mg by mouth daily as needed for allergies., Disp: , Rfl:    cyanocobalamin (VITAMIN  B12) 1000 MCG tablet, Take 1,000 mcg by mouth daily., Disp: , Rfl:    docusate sodium (COLACE) 100 MG capsule, Take 100 mg by mouth daily. , Disp: , Rfl:    esomeprazole (NEXIUM) 20 MG capsule,  Take 20 mg by mouth daily at 12 noon., Disp: , Rfl:    FARXIGA 10 MG TABS tablet, Take 5 mg by mouth daily. Taking 5 mg daily, Disp: , Rfl:    fluticasone (FLONASE) 50 MCG/ACT nasal spray, Place 1 spray into both nostrils daily as needed for allergies., Disp: , Rfl:    glimepiride (AMARYL) 2 MG tablet, Take 2 mg by mouth daily with breakfast., Disp: , Rfl:    Glucose Blood (IGLUCOSE TEST STRIPS VI), by In Vitro route., Disp: , Rfl:    Lancets MISC, by Does not apply route., Disp: , Rfl:    metFORMIN (GLUCOPHAGE) 1000 MG tablet, Take 500 mg by mouth 2 (two) times daily. , Disp: , Rfl:    rosuvastatin (CRESTOR) 5 MG tablet, Take 5 mg by mouth daily., Disp: , Rfl:    tobramycin (TOBREX) 0.3 % ophthalmic solution, Place 1 drop into both eyes every 4 (four) hours., Disp: , Rfl:    VITAMIN D PO, Take 1,000 Units by mouth daily., Disp: , Rfl:    Zinc 50 MG TABS, Take 50 mg by mouth daily., Disp: , Rfl:    midodrine (PROAMATINE) 10 MG tablet, Take 1 tablet (10 mg total) by mouth 3 (three) times daily with meals. While awake. Do not lay down for 3-4 hours after taking (Patient taking differently: Take 10 mg by mouth 3 (three) times daily with meals. While awake as needed for low BP. Do not lay down for 3-4 hours after taking), Disp: 270 tablet, Rfl: 3   Cardiac Studies:   Coronary Angiography 2017: In [place], [place]: No significant CAD.   Event Monitor for 30 days Start date  12/02/2018 - 12/31/2018: Baseline sample showed Sinus Rhythm with a heart rate of 82.7 bpm. There were 0 critical, 0 serious, and 3 stable events that occurred.  Automatically Detected Events: 1 Stable: Sinus Rhythm w/Run of V-Tach (5 Beats) at 4 pm. Manually Detected Events: 1 Stable: Sinus Rhythm w/1st Degree AV Block/Artifact  Echocardiogram 06/05/2022:  1. Left ventricular ejection fraction, by estimation, is 60 to 65%. The left ventricle has normal function. The left ventricle has no regional wall motion abnormalities. Left  ventricular diastolic parameters are indeterminate.  2. Right ventricular systolic function is normal. The right ventricular size is normal. There is normal pulmonary artery systolic pressure. The estimated right ventricular systolic pressure is 123XX123 mmHg.  3. The mitral valve is grossly normal. Trivial mitral valve regurgitation. No evidence of mitral stenosis.  4. The aortic valve is tricuspid. Aortic valve regurgitation is trivial. Aortic valve sclerosis is present, with no evidence of aortic valve stenosis.  5. Aortic dilatation noted. There is borderline dilatation of the aortic root, measuring 38 mm.  6. The inferior vena cava is dilated in size with >50% respiratory variability, suggesting right atrial pressure of 8 mmHg.  EKG:   EKG 07/31/2022: Sinus bradycardia at rate of 59 bpm, left atrial enlargement, left axis deviation, left anterior fascicular block.  IVCD, borderline criteria for LVH.  No evidence of ischemia.  Compared to 06/19/2022, no significant change. No significant change from 12/02/2018.  EKG1/15/2024 at 1429 hrs.: Normal sinus rhythm at rate of 75 bpm, IVCD, no evidence of ischemia.  Compared to 07: 39 hours EKG, atrial  fibrillation with rapid ventricular sponsor not present.  Assessment     ICD-10-CM   1. PAF (paroxysmal atrial fibrillation) (HCC)  I48.0 EKG 12-Lead    2. Primary hypertension  I10     3. Orthostatic hypotension  I95.1 midodrine (PROAMATINE) 10 MG tablet     CHA2DS2-VASc Score is 4.  Yearly risk of stroke: 5% (A, HTN, DM).  Score of 1=0.6; 2=2.2; 3=3.2; 4=4.8; 5=7.2; 6=9.8; 7=>9.8) -(CHF; HTN; vasc disease DM,  Male = 1; Age <65 =0; 65-74 = 1,  >75 =2; stroke/embolism= 2).   Recommendations:   TEMPLE SCHANKE is a 82 y.o. Caucasian male with longstanding history of diabetes mellitus with stage 3 CKD, hyperlipidemia, hypertension, palpitations and near syncopal spells due to severe orthostatic hypotension.  He is now compliant with support hose use.     Admitted to the hospital on 06/04/2022 when he presented with marked generalized weakness, hypotension, dehydration and also new onset A-fib with RVR, was started on amiodarone but converted to sinus rhythm within a few hours.  He was eventually discharged home on amiodarone and anticoagulation.  Readmitted 1 day later on 06/06/2022 due to feeling poorly and positive blood culture with Enterococcus faecalis and he just completed 3 weeks of antibiotic therapy.  TTE was negative for endocarditis.  This is 6-week office visit, patient is feeling remarkably well.  1. PAF (paroxysmal atrial fibrillation) ([place]) Patient has had 1 episode of atrial fibrillation with RVR, converted to sinus rhythm spontaneously but was also started on amiodarone later and has maintained sinus rhythm.  He has been off of amiodarone for the past 1 month since his last office visit with me.  In view of orthostatic hypotension, it is probably best that he be off of amiodarone and I also suspect that his atrial fibrillation was induced by septic shock.  Hopefully will continue to maintain sinus rhythm, we could potentially consider taking him off of anticoagulation at that time if he maintains sinus rhythm although his CHA2DS2-VASc rescore is 4.0.  2.  Supine hypertension Patient has severe orthostatic hypotension and supine hypertension.  Since reducing the dose of the Jardiance to 5 mg, blood pressure has remained stable, he is also been taking midodrine 10 mg 3 times daily with marked improvement in his quality of life.  He is also not having presyncopal spells especially when he wakes up in the morning as he takes midodrine while in bed 30 minutes before he wakes up.  He is aware that he should not sleep after he takes the medicine for at least 3 to 4 hours and also to sleep reclined to reduce supine hypertension side effects especially stroke risk.  3. Orthostatic hypotension As dictated above, overall he is doing well,  hopefully over the next 3 to 6 months I suspect he will have resolution of orthostasis and I suspect his orthostatic hypotension was probably related to sepsis.  He is also on B1/B6/B12 supplements as well.  He is also compliant with support stockings and is also keeping well-hydrated.  I will see him back in the office in 3 months.    Adrian Prows, MD, [facility] 07/31/2022, 3:08 PM Office: [phone] Fax: [phone] Pager: [phone]

## 2022-09-24 ENCOUNTER — Encounter: Payer: Self-pay | Admitting: Cardiology

## 2022-09-25 NOTE — Telephone Encounter (Signed)
Can you look at this please

## 2022-09-25 NOTE — Telephone Encounter (Signed)
He sounds dehydrated, he is not on any BP meds. I would encourage hydration and take midodrine that he has prescribed

## 2022-09-25 NOTE — Telephone Encounter (Signed)
From patient.

## 2022-09-27 ENCOUNTER — Ambulatory Visit: Payer: Medicare Other | Admitting: Cardiology

## 2022-09-27 ENCOUNTER — Encounter: Payer: Self-pay | Admitting: Cardiology

## 2022-09-27 VITALS — BP 135/80 | HR 80 | Ht 70.0 in | Wt 166.0 lb

## 2022-09-27 DIAGNOSIS — I951 Orthostatic hypotension: Secondary | ICD-10-CM

## 2022-09-27 DIAGNOSIS — I10 Essential (primary) hypertension: Secondary | ICD-10-CM

## 2022-09-27 DIAGNOSIS — I480 Paroxysmal atrial fibrillation: Secondary | ICD-10-CM

## 2022-09-27 MED ORDER — FLUDROCORTISONE ACETATE 0.1 MG PO TABS: 0.1000 mg | ORAL_TABLET | Freq: Every evening | ORAL | 2 refills | Status: DC

## 2022-09-27 MED ORDER — ASPIRIN 81 MG PO CHEW: 81.0000 mg | CHEWABLE_TABLET | Freq: Every day | ORAL | Status: DC

## 2022-09-27 NOTE — Progress Notes (Signed)
Primary Physician/Referring:  Aliene Beams, MD  Patient ID: MUAD KIRCH, male    DOB: 1941/05/18, 82 y.o.   MRN: [id number]  Subjective   Chief Complaint  Patient presents with   PAF (paroxysmal atrial fibrillation) (HCC)   Follow-up    HPI: Eric Harrington  is a 82 y.o. Caucasian male with longstanding history of diabetes mellitus with stage 3 CKD, CLL, hyperlipidemia, hypertension, palpitations and near syncopal spells due to severe orthostatic hypotension.  He is now compliant with support hose use.    Admitted to the hospital on 06/04/2022 when he presented with marked generalized weakness, hypotension, dehydration and also new onset A-fib with RVR, was started on amiodarone but converted to sinus rhythm within a few hours.  He was eventually discharged home on amiodarone and anticoagulation.  Readmitted 1 day later on 06/06/2022 due to feeling poorly and positive blood culture with Enterococcus faecalis and he just completed 3 weeks of antibiotic therapy.  TTE was negative for endocarditis.  Patient's family made an appointment to see me as he has had recurrence of dizziness and orthostasis.  They are wondering if he is back in atrial fibrillation.  He has been off of amiodarone for 4 months now.  He has not had any frank syncope.  Past Medical History:  Diagnosis Date   Chronic Leukemia    Diabetes mellitus without complication (HCC)    Hyperlipemia 12/02/2018   Hyperlipidemia    Hypertension    Type 2 diabetes mellitus with complication, without long-term current use of insulin (HCC) 12/02/2018    Past Surgical History:  Procedure Laterality Date   CARDIAC CATHETERIZATION  2017   Social History   Tobacco Use   Smoking status: Former    Packs/day: 1.00    Years: 30.00    Additional pack years: 0.00    Total pack years: 30.00    Types: Cigarettes    Quit date: 1980    Years since quitting: 44.3   Smokeless tobacco: Never   Tobacco comments:    started at age 71   Substance Use Topics   Alcohol use: Never    Marital Status: Married   Review of Systems  Cardiovascular:  Negative for chest pain, dyspnea on exertion and leg swelling.  Neurological:  Positive for dizziness and loss of balance.   Objective      09/27/2022    9:58 PM 09/27/2022    3:04 PM 09/27/2022    2:52 PM  Vitals with BMI  Height   5\' 10"   Weight   166 lbs  BMI   23.82  Systolic 135 100 244  Diastolic 80 52 80  Pulse 80 84 80   Today's Vitals   09/27/22 1452 09/27/22 1504 09/27/22 2158  BP: 135/80 (!) 100/52 135/80  Pulse: 80 84 80  SpO2: 97% 96%   Weight: 166 lb (75.3 kg)    Height: 5\' 10"  (1.778 m)     Body mass index is 23.82 kg/m.  Orthostatic VS for the past 72 hrs (Last 3 readings):  Patient Position BP Location Cuff Size  09/27/22 2158 Sitting Left Arm --  09/27/22 1504 Standing Left Arm Large  09/27/22 1452 Sitting Left Arm Large     Physical Exam Neck:     Vascular: No carotid bruit or JVD.  Cardiovascular:     Rate and Rhythm: Normal rate and regular rhythm.     Pulses: Normal pulses and intact distal pulses.     Heart sounds:  Normal heart sounds. No murmur heard.    No gallop.  Pulmonary:     Effort: Pulmonary effort is normal.     Breath sounds: Normal breath sounds.  Abdominal:     General: Bowel sounds are normal.     Palpations: Abdomen is soft.  Musculoskeletal:     Right lower leg: No edema.     Left lower leg: No edema.    Radiology: No results found.  Laboratory examination:   Lab Results  Component Value Date   NA 136 07/11/2022   K 3.9 07/11/2022   CO2 26 07/11/2022   GLUCOSE 149 (H) 07/11/2022   BUN 22 07/11/2022   CREATININE 1.69 (H) 07/11/2022   CALCIUM 9.4 07/11/2022   GFRNONAA 41 (L) 07/11/2022       Latest Ref Rng & Units 07/11/2022   12:10 PM 06/09/2022    3:47 AM 06/08/2022    4:19 AM  CMP  Glucose 70 - 99 mg/dL 086  578  469   BUN 8 - 23 mg/dL 22  18  15    Creatinine 0.61 - 1.24 mg/dL 6.29  5.28  4.13    Sodium 135 - 145 mmol/L 136  133  133   Potassium 3.5 - 5.1 mmol/L 3.9  4.1  4.3   Chloride 98 - 111 mmol/L 102  102  103   CO2 22 - 32 mmol/L 26  24  23    Calcium 8.9 - 10.3 mg/dL 9.4  8.6  8.7   Total Protein 6.5 - 8.1 g/dL 6.6  5.6  5.5   Total Bilirubin 0.3 - 1.2 mg/dL 0.4  0.4  0.3   Alkaline Phos 38 - 126 U/L 92  85  89   AST 15 - 41 U/L 13  14  12    ALT 0 - 44 U/L 14  13  11        Latest Ref Rng & Units 07/11/2022   12:10 PM 06/09/2022    3:47 AM 06/08/2022    4:19 AM  CBC  WBC 4.0 - 10.5 K/uL 50.5  47.6  48.9   Hemoglobin 13.0 - 17.0 g/dL 24.4  01.0  27.2   Hematocrit 39.0 - 52.0 % 35.6  32.1  33.1   Platelets 150 - 400 K/uL 111  113  121    HEMOGLOBIN A1C Lab Results  Component Value Date   HGBA1C 7.0 (H) 06/05/2022   MPG 154.2 06/05/2022   TSH Recent Labs    06/05/22 0515  TSH 0.789   External labs:  Cholesterol, total 134.000 m 07/28/2021 HDL 27.000 mg 07/28/2021 LDL 79.000 mg 07/28/2021 Triglycerides 157.000 m 07/28/2021  A1C 7.000 % 06/05/2022  Medications   Current Outpatient Medications:    albuterol (VENTOLIN HFA) 108 (90 Base) MCG/ACT inhaler, Inhale 1 puff into the lungs every 4 (four) hours as needed for wheezing or shortness of breath., Disp: , Rfl:    Ascorbic Acid (VITAMIN C) 1000 MG tablet, Take 1,000 mg by mouth daily., Disp: , Rfl:    carboxymethylcellulose (REFRESH PLUS) 0.5 % SOLN, Place 1 drop into both eyes daily as needed (dry eyes)., Disp: , Rfl:    cetirizine (ZYRTEC) 10 MG tablet, Take 10 mg by mouth daily as needed for allergies., Disp: , Rfl:    cyanocobalamin (VITAMIN B12) 1000 MCG tablet, Take 1,000 mcg by mouth daily., Disp: , Rfl:    docusate sodium (COLACE) 100 MG capsule, Take 100 mg by mouth daily. , Disp: , Rfl:  esomeprazole (NEXIUM) 20 MG capsule, Take 20 mg by mouth daily at 12 noon., Disp: , Rfl:    fludrocortisone (FLORINEF) 0.1 MG tablet, Take 1 tablet (0.1 mg total) by mouth every evening., Disp: 30 tablet, Rfl: 2    fluticasone (FLONASE) 50 MCG/ACT nasal spray, Place 1 spray into both nostrils daily as needed for allergies., Disp: , Rfl:    glimepiride (AMARYL) 2 MG tablet, Take 2 mg by mouth daily with breakfast., Disp: , Rfl:    Glucose Blood (IGLUCOSE TEST STRIPS VI), by In Vitro route., Disp: , Rfl:    Lancets MISC, by Does not apply route., Disp: , Rfl:    metFORMIN (GLUCOPHAGE) 1000 MG tablet, Take 500 mg by mouth 2 (two) times daily. , Disp: , Rfl:    midodrine (PROAMATINE) 10 MG tablet, Take 1 tablet (10 mg total) by mouth 3 (three) times daily with meals. While awake. Do not lay down for 3-4 hours after taking (Patient taking differently: Take 10 mg by mouth 4 (four) times daily. While awake as needed for low BP. Do not lay down for 3-4 hours after taking), Disp: 270 tablet, Rfl: 3   rosuvastatin (CRESTOR) 5 MG tablet, Take 5 mg by mouth daily., Disp: , Rfl:    VITAMIN D PO, Take 1,000 Units by mouth daily., Disp: , Rfl:    Zinc 50 MG TABS, Take 50 mg by mouth daily., Disp: , Rfl:    Cardiac Studies:   Coronary Angiography 2017: In [place], [place]: No significant CAD.   Event Monitor for 30 days Start date  12/02/2018 - 12/31/2018: Baseline sample showed Sinus Rhythm with a heart rate of 82.7 bpm. There were 0 critical, 0 serious, and 3 stable events that occurred.  Automatically Detected Events: 1 Stable: Sinus Rhythm w/Run of V-Tach (5 Beats) at 4 pm. Manually Detected Events: 1 Stable: Sinus Rhythm w/1st Degree AV Block/Artifact  Echocardiogram 06/05/2022:  1. Left ventricular ejection fraction, by estimation, is 60 to 65%. The left ventricle has normal function. The left ventricle has no regional wall motion abnormalities. Left ventricular diastolic parameters are indeterminate.  2. Right ventricular systolic function is normal. The right ventricular size is normal. There is normal pulmonary artery systolic pressure. The estimated right ventricular systolic pressure is 33.6 mmHg.  3. The mitral  valve is grossly normal. Trivial mitral valve regurgitation. No evidence of mitral stenosis.  4. The aortic valve is tricuspid. Aortic valve regurgitation is trivial. Aortic valve sclerosis is present, with no evidence of aortic valve stenosis.  5. Aortic dilatation noted. There is borderline dilatation of the aortic root, measuring 38 mm.  6. The inferior vena cava is dilated in size with >50% respiratory variability, suggesting right atrial pressure of 8 mmHg.  EKG:   EKG 09/27/2022: Normal sinus rhythm at rate of 63 bpm, left axis deviation, left anterior fascicular block.  No evidence of ischemia.  Compared to 07/31/2022, no significant change.  Assessment     ICD-10-CM   1. PAF (paroxysmal atrial fibrillation) (HCC)  I48.0 EKG 12-Lead    DISCONTINUED: aspirin (ASPIRIN CHILDRENS) 81 MG chewable tablet    2. Orthostatic hypotension  I95.1 fludrocortisone (FLORINEF) 0.1 MG tablet    3. Supine hypertension  I10      CHA2DS2-VASc Score is 4.  Yearly risk of stroke: 5% (A, HTN, DM).  Score of 1=0.6; 2=2.2; 3=3.2; 4=4.8; 5=7.2; 6=9.8; 7=>9.8) -(CHF; HTN; vasc disease DM,  Male = 1; Age <65 =0; 65-74 = 1,  >75 =2;  stroke/embolism= 2).   Recommendations:   CATRELL THORNBERRY is a 82 y.o. Caucasian male with longstanding history of diabetes mellitus with stage 3 CKD, CLL, hyperlipidemia, hypertension, palpitations and near syncopal spells due to severe orthostatic hypotension.  He is now compliant with support hose use.    Admitted to the hospital on 06/04/2022 when he presented with marked generalized weakness, hypotension, dehydration and also new onset A-fib with RVR, was started on amiodarone but converted to sinus rhythm within a few hours.  He was eventually discharged home on amiodarone and anticoagulation.  Readmitted 1 day later on 06/06/2022 due to feeling poorly and positive blood culture with Enterococcus faecalis and he just completed 3 weeks of antibiotic therapy.  TTE was negative for  endocarditis.  Patient's family made an appointment to see me as he has had recurrence of dizziness and orthostasis.  1. PAF (paroxysmal atrial fibrillation) [facility]) Patient family was concerned if he has gone back into atrial fibrillation.  Patient himself feels well.  He is maintaining sinus rhythm.  As I had felt in the past that the atrial fibrillation with RVR when he presented in January 2024 was probably related to sepsis, he has not had any recurrence of atrial fibrillation.  I will go ahead and discontinue Eliquis.  No indication for aspirin specially in view of CLL and chronic anemia and thrombocytopenia.  - EKG 12-Lead  2. Orthostatic hypotension Patient is still orthostatic, dropping his blood pressure about 30-35 points.  I will add fludrocortisone 0.1 mg daily.  - fludrocortisone (FLORINEF) 0.1 MG tablet; Take 1 tablet (0.1 mg total) by mouth every evening.  Dispense: 30 tablet; Refill: 2  3. Supine hypertension I suspect his supine hypertension is now resolved.  He is not on any antihypertensive medications.  Suspect critical illness neuropathy is the etiology for his orthostatic hypotension.  I have reassured the patient's family, given them instructions regarding use and changes that can be done between use of fludrocortisone and midodrine.  I would like to see him back in 3 months for follow-up.     Yates Decamp, MD, [facility] 09/27/2022, 9:58 PM Office: [phone] Fax: [phone] Pager: [phone]

## 2022-09-27 NOTE — Patient Instructions (Addendum)
I have started you on fludrocortisone, take 1 tablet every day after dinner.  If your blood pressure goes up, you can start using midodrine 1/2 tablet as needed if your blood pressure is >110 mmHg standing.  If you start noticing any leg swelling with fludrocortisone, you can change the medication to taking it every other day as well.  Both midodrine and fludrocortisone are quality improving drugs and not lifesaving drugs.  He could certainly play around with them if the standing blood pressure is >120 mmHg 225 mmHg, he can certainly cut the dosage down.  He can certainly also keep me informed.

## 2022-10-03 ENCOUNTER — Encounter: Payer: Self-pay | Admitting: Hematology

## 2022-10-25 ENCOUNTER — Other Ambulatory Visit: Payer: Self-pay

## 2022-10-25 ENCOUNTER — Emergency Department (HOSPITAL_COMMUNITY): Payer: Medicare Other

## 2022-10-25 ENCOUNTER — Observation Stay (HOSPITAL_COMMUNITY)
Admission: EM | Admit: 2022-10-25 | Discharge: 2022-10-26 | Disposition: A | Discharge status: Home / Self Care | Payer: Medicare Other | Attending: Internal Medicine | Admitting: Internal Medicine

## 2022-10-25 ENCOUNTER — Encounter (HOSPITAL_COMMUNITY): Payer: Self-pay | Admitting: Emergency Medicine

## 2022-10-25 DIAGNOSIS — Z87891 Personal history of nicotine dependence: Secondary | ICD-10-CM | POA: Diagnosis not present

## 2022-10-25 DIAGNOSIS — I951 Orthostatic hypotension: Secondary | ICD-10-CM | POA: Diagnosis present

## 2022-10-25 DIAGNOSIS — I129 Hypertensive chronic kidney disease with stage 1 through stage 4 chronic kidney disease, or unspecified chronic kidney disease: Secondary | ICD-10-CM | POA: Diagnosis not present

## 2022-10-25 DIAGNOSIS — E785 Hyperlipidemia, unspecified: Secondary | ICD-10-CM | POA: Insufficient documentation

## 2022-10-25 DIAGNOSIS — E1169 Type 2 diabetes mellitus with other specified complication: Secondary | ICD-10-CM | POA: Insufficient documentation

## 2022-10-25 DIAGNOSIS — E118 Type 2 diabetes mellitus with unspecified complications: Secondary | ICD-10-CM | POA: Diagnosis present

## 2022-10-25 DIAGNOSIS — R4701 Aphasia: Secondary | ICD-10-CM | POA: Diagnosis not present

## 2022-10-25 DIAGNOSIS — Z7901 Long term (current) use of anticoagulants: Secondary | ICD-10-CM | POA: Diagnosis not present

## 2022-10-25 DIAGNOSIS — R2689 Other abnormalities of gait and mobility: Secondary | ICD-10-CM | POA: Insufficient documentation

## 2022-10-25 DIAGNOSIS — J0100 Acute maxillary sinusitis, unspecified: Secondary | ICD-10-CM | POA: Insufficient documentation

## 2022-10-25 DIAGNOSIS — G459 Transient cerebral ischemic attack, unspecified: Secondary | ICD-10-CM

## 2022-10-25 DIAGNOSIS — C9110 Chronic lymphocytic leukemia of B-cell type not having achieved remission: Secondary | ICD-10-CM | POA: Diagnosis present

## 2022-10-25 DIAGNOSIS — N1831 Chronic kidney disease, stage 3a: Secondary | ICD-10-CM | POA: Insufficient documentation

## 2022-10-25 DIAGNOSIS — I480 Paroxysmal atrial fibrillation: Secondary | ICD-10-CM | POA: Insufficient documentation

## 2022-10-25 LAB — CBC WITH DIFFERENTIAL/PLATELET
Abs Immature Granulocytes: 0 10*3/uL (ref 0.00–0.07)
Basophils Absolute: 0 10*3/uL (ref 0.0–0.1)
Basophils Relative: 0 %
Eosinophils Absolute: 0 10*3/uL (ref 0.0–0.5)
Eosinophils Relative: 0 %
HCT: 29.1 % — ABNORMAL LOW (ref 39.0–52.0)
Hemoglobin: 9.1 g/dL — ABNORMAL LOW (ref 13.0–17.0)
Lymphocytes Relative: 89 %
Lymphs Abs: 64.2 10*3/uL — ABNORMAL HIGH (ref 0.7–4.0)
MCH: 32.4 pg (ref 26.0–34.0)
MCHC: 31.3 g/dL (ref 30.0–36.0)
MCV: 103.6 fL — ABNORMAL HIGH (ref 80.0–100.0)
Monocytes Absolute: 0.7 10*3/uL (ref 0.1–1.0)
Monocytes Relative: 1 %
Neutro Abs: 7.2 10*3/uL (ref 1.7–7.7)
Neutrophils Relative %: 10 %
Platelets: 118 10*3/uL — ABNORMAL LOW (ref 150–400)
RBC: 2.81 MIL/uL — ABNORMAL LOW (ref 4.22–5.81)
RDW: 14.8 % (ref 11.5–15.5)
WBC: 72.1 10*3/uL (ref 4.0–10.5)
nRBC: 0 % (ref 0.0–0.2)
nRBC: 0 /100 WBC

## 2022-10-25 LAB — COMPREHENSIVE METABOLIC PANEL
ALT: 17 U/L (ref 0–44)
AST: 28 U/L (ref 15–41)
Albumin: 3.8 g/dL (ref 3.5–5.0)
Alkaline Phosphatase: 70 U/L (ref 38–126)
Anion gap: 12 (ref 5–15)
BUN: 12 mg/dL (ref 8–23)
CO2: 25 mmol/L (ref 22–32)
Calcium: 9.4 mg/dL (ref 8.9–10.3)
Chloride: 98 mmol/L (ref 98–111)
Creatinine, Ser: 1.4 mg/dL — ABNORMAL HIGH (ref 0.61–1.24)
GFR, Estimated: 51 mL/min — ABNORMAL LOW (ref >60)
Glucose, Bld: 107 mg/dL — ABNORMAL HIGH (ref 70–99)
Potassium: 5 mmol/L (ref 3.5–5.1)
Sodium: 135 mmol/L (ref 135–145)
Total Bilirubin: 0.6 mg/dL (ref 0.3–1.2)
Total Protein: 5.8 g/dL — ABNORMAL LOW (ref 6.5–8.1)

## 2022-10-25 LAB — CBG MONITORING, ED: Glucose-Capillary: 112 mg/dL — ABNORMAL HIGH (ref 70–99)

## 2022-10-25 LAB — TROPONIN I (HIGH SENSITIVITY): Troponin I (High Sensitivity): 7 ng/L (ref <18)

## 2022-10-25 NOTE — ED Provider Notes (Signed)
[facility] AT [facility] Provider Note   CSN: [id number] Arrival date & time: 10/25/22  2027     History Chief Complaint  Patient presents with   Aphasia    HPI TALIQ CHILCOAT is a 82 y.o. male presenting for episode of aphasia.  82 year old male extensive medical history that approximately 2 hours prior to arrival had an abrupt sudden loss of speech.  He states that all of his symptoms have resolved at this time. Symptoms lasted at least 10 minutes but less than 1 hour per the spouse There was no weakness or sensory changes.  He did feel off balance.  No history of similar.  He did have an episode of altered mental status back in January that was found to be secondary to enterococcus bacteremia   Patient's recorded medical, surgical, social, medication list and allergies were reviewed in the Snapshot window as part of the initial history.   Review of Systems   Review of Systems  Constitutional:  Negative for chills and fever.  HENT:  Negative for ear pain and sore throat.   Eyes:  Negative for pain and visual disturbance.  Respiratory:  Negative for cough and shortness of breath.   Cardiovascular:  Negative for chest pain and palpitations.  Gastrointestinal:  Negative for abdominal pain and vomiting.  Genitourinary:  Negative for dysuria and hematuria.  Musculoskeletal:  Negative for arthralgias and back pain.  Skin:  Negative for color change and rash.  Neurological:  Positive for speech difficulty. Negative for seizures and syncope.  All other systems reviewed and are negative.   Physical Exam Updated Vital Signs BP (!) 188/87 (BP Location: Left Arm)   Pulse 77   Temp 98.4 F (36.9 C) (Oral)   Resp 19   Ht 5\' 10"  (1.778 m)   Wt 75.3 kg   SpO2 99%   BMI 23.82 kg/m  Physical Exam Vitals and nursing note reviewed.  Constitutional:      General: He is not in acute distress.    Appearance: He is well-developed.  HENT:     Head:  Normocephalic and atraumatic.  Eyes:     Conjunctiva/sclera: Conjunctivae normal.  Cardiovascular:     Rate and Rhythm: Normal rate and regular rhythm.     Heart sounds: No murmur heard. Pulmonary:     Effort: Pulmonary effort is normal. No respiratory distress.     Breath sounds: Normal breath sounds.  Abdominal:     Palpations: Abdomen is soft.     Tenderness: There is no abdominal tenderness.  Musculoskeletal:        General: No swelling.     Cervical back: Neck supple.  Skin:    General: Skin is warm and dry.     Capillary Refill: Capillary refill takes less than 2 seconds.  Neurological:     Mental Status: He is alert.  Psychiatric:        Mood and Affect: Mood normal.      ED Course/ Medical Decision Making/ A&P    Procedures Procedures   Medications Ordered in ED Medications - No data to display  Medical Decision Making:    AASON BENOIT is a 82 y.o. male who presented to the ED today with chief complaint of episode of aphasia detailed above.     Complete initial physical exam performed, notably the patient  was hemodynamically stable no acute distress.  Ambulatory tolerating p.o. intake.  Currently neurologically intact.  Reviewed and confirmed nursing documentation for past medical history, family history, social history.    Initial Assessment:   This is most consistent with an acute life/limb threatening illness complicated by underlying chronic conditions. Patient's HPI and PE findings otherwise consistent with TIA.  Symptoms have all resolved on arrival.  His Abcde score is 5 for his typical risk factors.  Have also considered infectious pathology given his history of enterococcus infection however this seems grossly less likely based on lack of symptomology  Initial Plan:  CT head to evaluate for intracranial hemorrhage given his elevated blood pressure with plan to follow-up with MRI for TIA evaluation as well as neurologic consultation Screening  labs including CBC and Metabolic panel to evaluate for infectious or metabolic etiology of disease.  Urinalysis with reflex culture ordered to evaluate for UTI or relevant urologic/nephrologic pathology.  CXR to evaluate for structural/infectious intrathoracic pathology.  EKG to evaluate for cardiac pathology. Objective evaluation as below reviewed with plan for close reassessment  Initial Study Results:   Laboratory  All laboratory results reviewed without evidence of clinically relevant pathology.   Exceptions include: Leukocytosis to 74,000.  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  Lymphocyte predominance  EKG EKG was reviewed independently. Rate, rhythm, axis, intervals all examined and without medically relevant abnormality. ST segments without concerns for elevations.    Radiology  All images reviewed independently. Agree with radiology report at this time.   CT HEAD WO CONTRAST ( )  Result Date: 10/25/2022 CLINICAL DATA:  Neuro deficit, acute stroke suspected. EXAM: CT HEAD WITHOUT CONTRAST TECHNIQUE: Contiguous axial images were obtained from the base of the skull through the vertex without intravenous contrast. RADIATION DOSE REDUCTION: This exam was performed according to the departmental dose-optimization program which includes automated exposure control, adjustment of the mA and/or kV according to patient size and/or use of iterative reconstruction technique. COMPARISON:  None Available. FINDINGS: Brain: No evidence of acute infarction, hemorrhage, hydrocephalus, extra-axial collection or mass lesion/mass effect. There is mild diffuse atrophy and mild periventricular white matter hypodensity, likely chronic small vessel ischemic change. There is an old lacunar infarct in the left basal ganglia. Vascular: Atherosclerotic calcifications are present within the cavernous internal carotid arteries. Skull: Normal. Negative for fracture or focal lesion. Sinuses/Orbits: There are air-fluid levels  in the bilateral maxillary sinuses. Orbits are within normal limits. Other: None. IMPRESSION: 1. No acute intracranial process. 2. Mild diffuse atrophy and chronic small vessel ischemic change. 3. Air-fluid levels in the bilateral maxillary sinuses. Correlate for acute sinusitis. Electronically Signed   By: Darliss Cheney M.D.   On: 10/25/2022 21:59   DG Chest Portable 1 View  Result Date: 10/25/2022 CLINICAL DATA:  Cough and fatigue EXAM: PORTABLE CHEST 1 VIEW COMPARISON:  Chest radiograph 06/04/2022 FINDINGS: Stable cardiomediastinal silhouette. Aortic atherosclerotic calcification. No focal consolidation, pleural effusion, or pneumothorax. No displaced rib fractures. IMPRESSION: No active disease. Electronically Signed   By: Minerva Fester M.D.   On: 10/25/2022 21:32     Consults:  Case discussed with Dr. Amada Jupiter of neurology.  He agreed with medical evaluation and admission for TIA.  He pointed out that the patient does have a history of A-fib not anticoagulated which may need to be addressed during this hospitalization..  Disposition:   Based on the above findings, I believe this patient is stable for admission.    Patient/family educated about specific findings on our evaluation and explained exact reasons for admission.  Patient/family educated about clinical situation and time  was allowed to answer questions.   Admission team communicated with and agreed with need for admission. Patient admitted. Patient  ready to move at this time.     Emergency Department Medication Summary:   Medications - No data to display       Clinical Impression:  1. TIA (transient ischemic attack)      Admit   Final Clinical Impression(s) / ED Diagnoses Final diagnoses:  TIA (transient ischemic attack)    Rx / DC Orders ED Discharge Orders     None         Glyn Ade, MD 10/25/22 2324

## 2022-10-25 NOTE — ED Notes (Signed)
Transported to MRI

## 2022-10-25 NOTE — H&P (Signed)
History and Physical    Eric Harrington WJX:[id number] DOB: [birth year] DOA: 10/25/2022  PCP: Aliene Beams, MD  Patient coming from: Home  I have personally briefly reviewed patient's old medical records in [facility]  Chief Complaint: Transient aphasia  HPI: Eric Harrington is a 82 y.o. male with medical history significant for PAF taken off Eliquis last month, CLL on active surveillance, CKD stage IIIa, T2DM, orthostatic hypotension on midodrine and Florinef, HLD who presented to the ED for evaluation of transient aphasia.  Patient states while standing in his garage around 6:45 PM he developed new onset of slurred speech and word finding difficulty.  He felt generally weak but denied any focal weakness in his arms or legs.  He had a headache.  The symptoms lasted at least 30 minutes by less than 1 hour per his spouse.  EMS were called and he was brought to the ED for further evaluation.  Patient was noted to have new A-fib with RVR in January 2024 in setting of acute illness.  He was found to have Enterococcus bacteremia.  He has maintained sinus rhythm on follow-up EKGs and last month was taken off Eliquis by his cardiologist since he had not had any recurrence and it was felt that his A-fib RVR was related to infectious process.  Patient denies any lightheadedness, dizziness, chest pain, palpitations, dyspnea, nausea, vomiting, abdominal pain, dysuria.  ED Course  Labs/Imaging on admission: I have personally reviewed following labs and imaging studies.  Initial vitals showed BP 188/87, pulse 77, RR 19, temp 98.4 F, SpO2 99% on room air.  Labs show WBC 72.1 with lymphocytic predominance, hemoglobin 9.1, platelets 118,000, sodium 135, potassium 5.0, bicarb 25, BUN 12, creatinine 1.40, serum glucose 107, LFTs within normal limits, troponin 7.  Portable chest x-ray negative for focal consolidation, edema, effusion.  CT head without contrast negative for acute intracranial process.   Mild diffuse atrophy and chronic small vessel ischemic change noted.  MRI brain ordered and pending.  EDP discussed with neurology, Dr. Amada Jupiter who recommended medical admission for TIA evaluation.  The hospitalist service was consulted to admit for further evaluation and management.  Review of Systems: All systems reviewed and are negative except as documented in history of present illness above.   Past Medical History:  Diagnosis Date   Chronic Leukemia    Diabetes mellitus without complication (HCC)    Hyperlipemia 12/02/2018   Hyperlipidemia    Hypertension    Type 2 diabetes mellitus with complication, without long-term current use of insulin (HCC) 12/02/2018    Past Surgical History:  Procedure Laterality Date   CARDIAC CATHETERIZATION  2017    Social History:  reports that he quit smoking about 44 years ago. His smoking use included cigarettes. He has a 30.00 pack-year smoking history. He has never used smokeless tobacco. He reports that he does not currently use drugs. He reports that he does not drink alcohol.  Allergies  Allergen Reactions   Amlodipine Besylate Rash    Fatigue, chills, fever   Bystolic [Nebivolol Hcl]    Cephalexin    Doxycycline     Fever/chills   Gemfibrozil    Glipizide    Januvia [Sitagliptin]    Lipitor [Atorvastatin Calcium]    Lisinopril Other (See Comments)    Pt states lisinopril causes severe constipation    Losartan    Metoprolol Other (See Comments)    weakness   Omeprazole    Simvastatin    Gabapentin  Family History  Problem Relation Age of Onset   Lung cancer Mother    Heart attack Father    Dementia Sister    Diabetes Brother    Diabetes Sister    Heart attack Brother      Prior to Admission medications   Medication Sig Start Date End Date Taking? Authorizing Provider  albuterol (VENTOLIN HFA) 108 (90 Base) MCG/ACT inhaler Inhale 1 puff into the lungs every 4 (four) hours as needed for wheezing or  shortness of breath.    [provider]  Ascorbic Acid (VITAMIN C) 1000 MG tablet Take 1,000 mg by mouth daily.    [provider]  carboxymethylcellulose (REFRESH PLUS) 0.5 % SOLN Place 1 drop into both eyes daily as needed (dry eyes).    [provider]  cetirizine (ZYRTEC) 10 MG tablet Take 10 mg by mouth daily as needed for allergies.    [provider]  cyanocobalamin (VITAMIN B12) 1000 MCG tablet Take 1,000 mcg by mouth daily.    [provider]  docusate sodium (COLACE) 100 MG capsule Take 100 mg by mouth daily.     [provider]  esomeprazole (NEXIUM) 20 MG capsule Take 20 mg by mouth daily at 12 noon.    [provider]  fludrocortisone (FLORINEF) 0.1 MG tablet Take 1 tablet (0.1 mg total) by mouth every evening. 09/27/22   Yates Decamp, MD  fluticasone (FLONASE) 50 MCG/ACT nasal spray Place 1 spray into both nostrils daily as needed for allergies.    [provider]  glimepiride (AMARYL) 2 MG tablet Take 2 mg by mouth daily with breakfast. 11/14/20   [provider]  Glucose Blood (IGLUCOSE TEST STRIPS VI) by In Vitro route.    [provider]  Lancets MISC by Does not apply route.    [provider]  metFORMIN (GLUCOPHAGE) 1000 MG tablet Take 500 mg by mouth 2 (two) times daily.  05/06/17   [provider]  midodrine (PROAMATINE) 10 MG tablet Take 1 tablet (10 mg total) by mouth 3 (three) times daily with meals. While awake. Do not lay down for 3-4 hours after taking Patient taking differently: Take 10 mg by mouth 4 (four) times daily. While awake as needed for low BP. Do not lay down for 3-4 hours after taking 07/31/22   Yates Decamp, MD  rosuvastatin (CRESTOR) 5 MG tablet Take 5 mg by mouth daily.    [provider]  VITAMIN D PO Take 1,000 Units by mouth daily.    [provider]  Zinc 50 MG TABS Take 50 mg by mouth daily.    [provider]    Physical  Exam: Vitals:   10/25/22 2028 10/25/22 2031 10/25/22 2034 10/25/22 2230  BP:   (!) 188/87 (!) 162/87  Pulse:   77 83  Resp:   19 (!) 21  Temp:   98.4 F (36.9 C)   TempSrc:   Oral   SpO2: 98%  99% 100%  Weight:  75.3 kg    Height:  5\' 10"  (1.778 m)     Constitutional: Resting in bed, NAD, calm, comfortable Eyes: EOMI, lids and conjunctivae normal ENMT: Mucous membranes are moist. Posterior pharynx clear of any exudate or lesions.Normal dentition.  Neck: normal, supple, no masses. Respiratory: clear to auscultation bilaterally, no wheezing, no crackles. Normal respiratory effort. No accessory muscle use.  Cardiovascular: Regular rate and rhythm, no murmurs / rubs / gallops. No extremity edema. 2+ pedal pulses. Abdomen:  no tenderness, no masses palpated. Musculoskeletal: no clubbing / cyanosis. No joint deformity upper and lower extremities. Good ROM, no contractures. Normal muscle tone.  Skin: no rashes, lesions, ulcers. No induration Neurologic: CN 2-12 grossly intact. Sensation intact. Strength 5/5 in all 4.  Psychiatric: Normal judgment and insight. Alert and oriented x 3. Normal mood.   EKG: Personally reviewed. Sinus rhythm, no acute ischemic changes.  Assessment/Plan Principal Problem:   Expressive aphasia Active Problems:   PAF (paroxysmal atrial fibrillation) (HCC)   Type 2 diabetes mellitus with complication, without long-term current use of insulin (HCC)   CLL (chronic lymphocytic leukemia) (HCC)   Chronic kidney disease, stage 3a (HCC)   Hyperlipidemia associated with type 2 diabetes mellitus (HCC)   Orthostatic hypotension   Eric Harrington is a 82 y.o. male with medical history significant for PAF taken off Eliquis last month, CLL on active surveillance, CKD stage IIIa, T2DM, orthostatic hypotension on midodrine and Florinef, HLD who presented with transient expressive aphasia and admitted for CVA workup.  Assessment and Plan: Transient expressive  aphasia: Neurology recommending admission for CVA workup.  Patient has paroxysmal atrial fibrillation, was taken off Eliquis 1 month ago.  No evidence of A-fib recurrence. -CT head negative -MRI brain pending -Given concern for TIA/CVA will start back on Eliquis 5 mg BID -May benefit from outpatient cardiac monitor/ILR -Echocardiogram, carotid Dopplers -Check A1c and lipid panel -Continue rosuvastatin -Keep on telemetry, continue neurochecks  Paroxysmal atrial fibrillation: Remains in sinus rhythm on admission with controlled rate.  Taken off Eliquis 1 month ago due to stop that A-fib was situational in setting of acute illness without recurrence. -Placed back on Eliquis as above -Keep on telemetry  Chronic lymphocytic leukemia: WBC increased from baseline to 72.1.  Follows with hematology/oncology Dr. Candise Che, has been on active surveillance.  No signs/symptoms of superimposed infection.  CKD stage IIIa: Renal function stable.  Orthostatic hypotension: Documented to have severe orthostatic hypotension by his cardiologist with supine hypertension.  He is on midodrine and Florinef as an outpatient.  Will continue Florinef, resume midodrine if needed.  Type 2 diabetes: Holding metformin for now.  Anemia and thrombocytopenia: Hemoglobin slightly decreased from baseline to 9.1.  Platelets stable at baseline.  No obvious bleeding.   DVT prophylaxis:  apixaban (ELIQUIS) tablet 5 mg   Code Status: Full code, confirmed with patient on admission Family Communication: Spouse and friend at bedside Disposition Plan: From home, dispo pending clinical progress Consults called: Neurology Severity of Illness: The appropriate patient status for this patient is OBSERVATION. Observation status is judged to be reasonable and necessary in order to provide the required intensity of service to ensure the patient's safety. The patient's presenting symptoms, physical exam findings, and initial radiographic  and laboratory data in the context of their medical condition is felt to place them at decreased risk for further clinical deterioration. Furthermore, it is anticipated that the patient will be medically stable for discharge from the hospital within 2 midnights of admission.   Darreld Mclean MD [facility]  If 7PM-7AM, please contact night-coverage [email or web address]  10/26/2022, 12:53 AM

## 2022-10-25 NOTE — ED Triage Notes (Signed)
Pt arrives via [facility] from home d/t concern for stroke like symptoms. Onset of symptoms 6:45 pm. EMS report wife noticed slurred speech, difficulty walking, difficulty finding his words. S/S lasting 10 mins. Resolved prior to EMS arrival.

## 2022-10-26 ENCOUNTER — Encounter (HOSPITAL_COMMUNITY): Payer: Medicare Other

## 2022-10-26 ENCOUNTER — Other Ambulatory Visit (HOSPITAL_COMMUNITY): Payer: Self-pay

## 2022-10-26 ENCOUNTER — Observation Stay (HOSPITAL_BASED_OUTPATIENT_CLINIC_OR_DEPARTMENT_OTHER): Payer: Medicare Other

## 2022-10-26 ENCOUNTER — Observation Stay (HOSPITAL_COMMUNITY): Payer: Medicare Other

## 2022-10-26 DIAGNOSIS — G459 Transient cerebral ischemic attack, unspecified: Secondary | ICD-10-CM

## 2022-10-26 DIAGNOSIS — R4701 Aphasia: Secondary | ICD-10-CM | POA: Diagnosis not present

## 2022-10-26 LAB — CBC WITH DIFFERENTIAL/PLATELET
Abs Immature Granulocytes: 0.03 10*3/uL (ref 0.00–0.07)
Basophils Absolute: 0.2 10*3/uL — ABNORMAL HIGH (ref 0.0–0.1)
Basophils Relative: 0 %
Eosinophils Absolute: 0 10*3/uL (ref 0.0–0.5)
Eosinophils Relative: 0 %
HCT: 30.5 % — ABNORMAL LOW (ref 39.0–52.0)
Hemoglobin: 9.8 g/dL — ABNORMAL LOW (ref 13.0–17.0)
Immature Granulocytes: 0 %
Lymphocytes Relative: 92 %
Lymphs Abs: 62.8 10*3/uL — ABNORMAL HIGH (ref 0.7–4.0)
MCH: 33.4 pg (ref 26.0–34.0)
MCHC: 32.1 g/dL (ref 30.0–36.0)
MCV: 104.1 fL — ABNORMAL HIGH (ref 80.0–100.0)
Monocytes Absolute: 4 10*3/uL — ABNORMAL HIGH (ref 0.1–1.0)
Monocytes Relative: 6 %
Neutro Abs: 1.3 10*3/uL — ABNORMAL LOW (ref 1.7–7.7)
Neutrophils Relative %: 2 %
Platelets: 111 10*3/uL — ABNORMAL LOW (ref 150–400)
RBC: 2.93 MIL/uL — ABNORMAL LOW (ref 4.22–5.81)
RDW: 14.8 % (ref 11.5–15.5)
WBC: 68.3 10*3/uL (ref 4.0–10.5)
nRBC: 0 % (ref 0.0–0.2)

## 2022-10-26 LAB — ECHOCARDIOGRAM COMPLETE
AR max vel: 2.81 cm2
AV Area VTI: 3.22 cm2
AV Area mean vel: 2.76 cm2
AV Mean grad: 3 mmHg
AV Peak grad: 5.3 mmHg
Ao pk vel: 1.15 m/s
Area-P 1/2: 5.02 cm2
Height: 70 in
MV M vel: 3.55 m/s
MV Peak grad: 50.4 mmHg
S' Lateral: 2.8 cm
Weight: 2656 oz

## 2022-10-26 LAB — BASIC METABOLIC PANEL
Anion gap: 7 (ref 5–15)
BUN: 11 mg/dL (ref 8–23)
CO2: 25 mmol/L (ref 22–32)
Calcium: 9.1 mg/dL (ref 8.9–10.3)
Chloride: 102 mmol/L (ref 98–111)
Creatinine, Ser: 1.34 mg/dL — ABNORMAL HIGH (ref 0.61–1.24)
GFR, Estimated: 54 mL/min — ABNORMAL LOW (ref >60)
Glucose, Bld: 140 mg/dL — ABNORMAL HIGH (ref 70–99)
Potassium: 4.5 mmol/L (ref 3.5–5.1)
Sodium: 134 mmol/L — ABNORMAL LOW (ref 135–145)

## 2022-10-26 LAB — GLUCOSE, CAPILLARY: Glucose-Capillary: 147 mg/dL — ABNORMAL HIGH (ref 70–99)

## 2022-10-26 LAB — LIPID PANEL
Cholesterol: 130 mg/dL (ref 0–200)
HDL: 22 mg/dL — ABNORMAL LOW (ref >40)
LDL Cholesterol: 56 mg/dL (ref 0–99)
Total CHOL/HDL Ratio: 5.9 RATIO
Triglycerides: 258 mg/dL — ABNORMAL HIGH (ref <150)
VLDL: 52 mg/dL — ABNORMAL HIGH (ref 0–40)

## 2022-10-26 LAB — HEMOGLOBIN A1C
Hgb A1c MFr Bld: 6.2 % — ABNORMAL HIGH (ref 4.8–5.6)
Mean Plasma Glucose: 131.24 mg/dL

## 2022-10-26 MED ORDER — ACETAMINOPHEN 650 MG RE SUPP: 650.0000 mg | RECTAL | Status: DC | PRN

## 2022-10-26 MED ORDER — IOHEXOL 350 MG/ML SOLN
75.0000 mL | Freq: Once | INTRAVENOUS | Status: AC | PRN
  Administered 2022-10-26: 75 mL via INTRAVENOUS

## 2022-10-26 MED ORDER — APIXABAN 5 MG PO TABS: 5.0000 mg | ORAL_TABLET | Freq: Two times a day (BID) | ORAL | 2 refills | Status: DC

## 2022-10-26 MED ORDER — STROKE: EARLY STAGES OF RECOVERY BOOK: Freq: Once | Status: DC

## 2022-10-26 MED ORDER — APIXABAN 5 MG PO TABS
5.0000 mg | ORAL_TABLET | Freq: Two times a day (BID) | ORAL | 0 refills | Status: DC
  Filled 2022-10-26: qty 60, 30d supply, fill #0

## 2022-10-26 MED ORDER — ONDANSETRON HCL 4 MG/2ML IJ SOLN: 4.0000 mg | Freq: Four times a day (QID) | INTRAMUSCULAR | Status: DC | PRN

## 2022-10-26 MED ORDER — ROSUVASTATIN CALCIUM 5 MG PO TABS
5.0000 mg | ORAL_TABLET | Freq: Every day | ORAL | Status: DC
  Administered 2022-10-26: 5 mg via ORAL
  Filled 2022-10-26: qty 1

## 2022-10-26 MED ORDER — METFORMIN HCL 1000 MG PO TABS: 500.0000 mg | ORAL_TABLET | Freq: Two times a day (BID) | ORAL | Status: DC

## 2022-10-26 MED ORDER — ACETAMINOPHEN 325 MG PO TABS: 650.0000 mg | ORAL_TABLET | ORAL | Status: DC | PRN

## 2022-10-26 MED ORDER — FLUDROCORTISONE ACETATE 0.1 MG PO TABS
0.1000 mg | ORAL_TABLET | Freq: Every evening | ORAL | Status: DC
  Filled 2022-10-26: qty 1

## 2022-10-26 MED ORDER — ACETAMINOPHEN 160 MG/5ML PO SOLN: 650.0000 mg | ORAL | Status: DC | PRN

## 2022-10-26 MED ORDER — SENNOSIDES-DOCUSATE SODIUM 8.6-50 MG PO TABS: 1.0000 | ORAL_TABLET | Freq: Every evening | ORAL | Status: DC | PRN

## 2022-10-26 MED ORDER — APIXABAN 5 MG PO TABS
5.0000 mg | ORAL_TABLET | Freq: Two times a day (BID) | ORAL | Status: DC
  Administered 2022-10-26 (×2): 5 mg via ORAL
  Filled 2022-10-26 (×2): qty 1

## 2022-10-26 NOTE — Evaluation (Signed)
Physical Therapy Evaluation Patient Details Name: Eric Harrington MRN: [id number] DOB: 05/22/1940 Today's Date: 10/26/2022  History of Present Illness  82 y/o M admitted to [facility] on 6/6 for transient aphasia. MRI negative for acute intracranial abnormality. PMHx: PAF, CLL, CKD, T2DM, orthostatic hypotension, HLD.  Clinical Impression  Pt tolerated today's session well, appearing at his mobility baseline. Pt performed all mobility with modI today, able to ambulate in the hallway without imbalance. Pt and his wife both report all symptoms have resolved and pt is at his baseline cognitively and physically. Pt has all needed DME at home, no concerns with mobility upon discharge. Pt has no further benefit from skilled acute PT at this time, no current need for PT upon discharge. Acute PT will sign off.        Recommendations for follow up therapy are one component of a multi-disciplinary discharge planning process, led by the attending physician.  Recommendations may be updated based on patient status, additional functional criteria and insurance authorization.  Follow Up Recommendations       Assistance Recommended at Discharge PRN  Patient can return home with the following  Assistance with cooking/housework;Assist for transportation    Equipment Recommendations None recommended by PT  Recommendations for Other Services       Functional Status Assessment Patient has had a recent decline in their functional status and demonstrates the ability to make significant improvements in function in a reasonable and predictable amount of time.     Precautions / Restrictions Precautions Precautions: Fall Restrictions Weight Bearing Restrictions: No      Mobility  Bed Mobility Overal bed mobility: Modified Independent             General bed mobility comments: HOB elevated    Transfers Overall transfer level: Modified independent Equipment used: Rolling walker (2 wheels)                General transfer comment: proper hand placement utilized    Ambulation/Gait Ambulation/Gait assistance: Modified independent (Device/Increase time) Gait Distance (Feet): 150 Feet Assistive device: Rolling walker (2 wheels) Gait Pattern/deviations: Step-through pattern, Decreased stride length Gait velocity: grossly WFL     General Gait Details: proper use of RW and manages well around obstalces, no imbalance noted  Stairs            Wheelchair Mobility    Modified Rankin (Stroke Patients Only)       Balance Overall balance assessment: Mild deficits observed, not formally tested (use of RW)                                           Pertinent Vitals/Pain Pain Assessment Pain Assessment: No/denies pain    Home Living Family/patient expects to be discharged to:: Private residence Living Arrangements: Spouse/significant other Available Help at Discharge: Available 24 hours/day Type of Home: House Home Access: Level entry       Home Layout: One level Home Equipment: Grab bars - tub/shower;Rollator (4 wheels);Cane - single point;Shower seat - built in      Prior Function Prior Level of Function : Independent/Modified Independent             Mobility Comments: modI with SPC outdoors, rollator inside at night to go to the bathroom, 1 fall in January ADLs Comments: wife assists with washing back occasionally, fatigues but has seat in shower, wife manages BP medication,  pt manages the rest     Hand Dominance   Dominant Hand: Right    Extremity/Trunk Assessment   Upper Extremity Assessment Upper Extremity Assessment: Overall WFL for tasks assessed    Lower Extremity Assessment Lower Extremity Assessment: Overall WFL for tasks assessed    Cervical / Trunk Assessment Cervical / Trunk Assessment: Kyphotic  Communication   Communication: HOH  Cognition Arousal/Alertness: Awake/alert Behavior During Therapy: WFL for tasks  assessed/performed Overall Cognitive Status: Within Functional Limits for tasks assessed                                 General Comments: A&Ox4, wife reporting pt at his baseline        General Comments General comments (skin integrity, edema, etc.): VSS on room air    Exercises     Assessment/Plan    PT Assessment Patient does not need any further PT services  PT Problem List         PT Treatment Interventions      PT Goals (Current goals can be found in the Care Plan section)  Acute Rehab PT Goals Patient Stated Goal: go home PT Goal Formulation: All assessment and education complete, DC therapy    Frequency       Co-evaluation               AM-PAC PT "6 Clicks" Mobility  Outcome Measure Help needed turning from your back to your side while in a flat bed without using bedrails?: None Help needed moving from lying on your back to sitting on the side of a flat bed without using bedrails?: None Help needed moving to and from a bed to a chair (including a wheelchair)?: None Help needed standing up from a chair using your arms (e.g., wheelchair or bedside chair)?: None Help needed to walk in hospital room?: None Help needed climbing 3-5 steps with a railing? : None 6 Click Score: 24    End of Session Equipment Utilized During Treatment: Gait belt Activity Tolerance: Patient tolerated treatment well Patient left: in bed;with call bell/phone within reach;with family/visitor present Nurse Communication: Mobility status PT Visit Diagnosis: Other abnormalities of gait and mobility (R26.89)    Time: 1010-1028 PT Time Calculation (min) (ACUTE ONLY): 18 min   Charges:   PT Evaluation $PT Eval Low Complexity: 1 Low          Lindalou Hose, PT DPT Acute Rehabilitation Services Office [phone]   Leonie Man 10/26/2022, 2:02 PM

## 2022-10-26 NOTE — Progress Notes (Signed)
[facility] PROGRESS NOTE   DEMETRIC [place]TH[place] BJY:[id number] DOB: 1941/01/01 DOA: 10/25/2022  PCP: Aliene Beams, MD  Brief History/Interval Summary: 82 y.o. male with medical history significant for PAF taken off Eliquis last month, CLL on active surveillance, CKD stage IIIa, T2DM, orthostatic hypotension on midodrine and Florinef, HLD who presented to the ED for evaluation of transient aphasia.  Symptoms resolved less than an hour later.  Patient was hospitalized for further management.   Consultants: Neurology  Procedures: Echocardiogram is pending    Subjective/Interval History: Patient feels well.  Speech is back to normal.  Denies any weakness in any 1 side of his body.    Assessment/Plan:  TIA Patient had transient aphasia which has now resolved. MRI negative for stroke. CT angiogram head and neck was done.  Defer findings to neurology. LDL is 56.  Continue with statin. HbA1c 6.2. Echocardiogram is pending PT and OT evaluations pending. Eliquis has been resumed.  Week of stroke MD input this morning.  Paroxysmal atrial fibrillation Sinus rhythm noted on EKG.  Patient followed by Dr. Jacinto Halim with cardiology.  Was recently taken off of Eliquis since that he did not have any recurrence of A-fib.  His initial episode of A-fib was in the setting of an acute illness. Now with of TIA he should be back on Eliquis.  Whether he needs loop recorder or event monitor going forward will be deferred to neurology and cardiology.  Can be accomplished in outpatient setting.  History of CLL/leukocytosis/microcytic anemia/thrombocytopenia Followed by Dr. Candise Che.  WBC is noted to be elevated.  Hemoglobin is stable.  No evidence of overt bleeding.  Mention of sinusitis on MRI but patient without any symptoms of same.  Chronic kidney disease stage IIIa Stable.  Avoid nephrotoxic agents.  Orthostatic hypotension On midodrine and Florinef.  Diabetes mellitus type 2, controlled Metformin  currently on hold.   DVT Prophylaxis: On Eliquis Code Status: Full code Family Communication: Discussed with patient Disposition Plan: Hopefully return home when improved and cleared by neurology  Status is: Observation The patient remains OBS appropriate and may or may not d/c before 2 midnights.      Medications: Scheduled:  [START ON 10/27/2022]  stroke: early stages of recovery book   Does not apply Once   apixaban  5 mg Oral BID   fludrocortisone  0.1 mg Oral QPM   rosuvastatin  5 mg Oral Daily   Continuous: YQM:[id number] **OR** acetaminophen (TYLENOL) oral liquid 160 mg/5 mL **OR** acetaminophen, ondansetron (ZOFRAN) IV, senna-docusate  Antibiotics: Anti-infectives (From admission, onward)    None       Objective:  Vital Signs  Vitals:   10/26/22 0400 10/26/22 0702 10/26/22 0742 10/26/22 0800  BP: (!) 151/84 (!) 158/90  (!) 140/78  Pulse: 89 89  95  Resp: 20 17  19   Temp:   98 F (36.7 C)   TempSrc:   Oral   SpO2: 100% 100%  99%  Weight:      Height:       No intake or output data in the 24 hours ending 10/26/22 0844 Filed Weights   10/25/22 2031  Weight: 75.3 kg    General appearance: Awake alert.  In no distress Resp: Clear to auscultation bilaterally.  Normal effort Cardio: S1-S2 is normal regular.  No S3-S4.  No rubs murmurs or bruit GI: Abdomen is soft.  Nontender nondistended.  Bowel sounds are present normal.  No masses organomegaly Extremities: No edema.  Full range of motion  of lower extremities. Neurologic: Alert and oriented x3.  No focal neurological deficits.    Lab Results:  Data Reviewed: I have personally reviewed following labs and reports of the imaging studies  CBC: Recent Labs  Lab 10/25/22 2033 10/26/22 0224  WBC 72.1* 68.3*  NEUTROABS 7.2 1.3*  HGB 9.1* 9.8*  HCT 29.1* 30.5*  MCV 103.6* 104.1*  PLT 118* 111*    Basic Metabolic Panel: Recent Labs  Lab 10/25/22 2033 10/26/22 0224  NA 135 134*  K 5.0 4.5   CL 98 102  CO2 25 25  GLUCOSE 107* 140*  BUN 12 11  CREATININE 1.40* 1.34*  CALCIUM 9.4 9.1    GFR: Estimated Creatinine Clearance: 46.2 mL/min (A) (by C-G formula based on SCr of 1.34 mg/dL (H)).  Liver Function Tests: Recent Labs  Lab 10/25/22 2033  AST 28  ALT 17  ALKPHOS 70  BILITOT 0.6  PROT 5.8*  ALBUMIN 3.8     HbA1C: Recent Labs    10/26/22 0224  HGBA1C 6.2*    CBG: Recent Labs  Lab 10/25/22 2233  GLUCAP 112*    Lipid Profile: Recent Labs    10/26/22 0224  CHOL 130  HDL 22*  LDLCALC 56  TRIG 161*  CHOLHDL 5.9     Radiology Studies: CT ANGIO HEAD NECK W WO CM  Result Date: 10/26/2022 CLINICAL DATA:  Stroke follow-up EXAM: CT ANGIOGRAPHY HEAD AND NECK WITH AND WITHOUT CONTRAST TECHNIQUE: Multidetector CT imaging of the head and neck was performed using the standard protocol during bolus administration of intravenous contrast. Multiplanar CT image reconstructions and MIPs were obtained to evaluate the vascular anatomy. Carotid stenosis measurements (when applicable) are obtained utilizing NASCET criteria, using the distal internal carotid diameter as the denominator. RADIATION DOSE REDUCTION: This exam was performed according to the departmental dose-optimization program which includes automated exposure control, adjustment of the mA and/or kV according to patient size and/or use of iterative reconstruction technique. CONTRAST:  75mL OMNIPAQUE IOHEXOL 350 MG/ML SOLN COMPARISON:  Brain MRI from yesterday FINDINGS: CTA NECK FINDINGS Aortic arch: Atherosclerotic plaque with 3 vessel branching. No acute finding or dilatation Right carotid system: Diffuse atheromatous wall thickening but mild for age. No stenosis or ulceration Left carotid system: Generalized atheromatous wall thickening. No stenosis or ulceration Vertebral arteries: Left dominant vertebral artery. The vertebral and subclavian arteries show atherosclerosis more notable on the right where there is  a severe origin stenosis with non measurable lumen on coronal reformats. No beading or dissection Skeleton: Generalized degenerative endplate and facet spurring. Other neck: Fluid levels in the bilateral maxillary sinus. Homogeneous enlargement of cervical lymph nodes throughout the bilateral neck, for example a left posterior triangle node measures 17 mm in length on 7:127. Upper chest: Clear apical lungs Review of the MIP images confirms the above findings CTA HEAD FINDINGS Anterior circulation: No branch occlusion, beading, or aneurysm. Mild atheromatous calcification at the siphons. Mild atheromatous irregularity of ACA and MCA branches on thick MIPS. Posterior circulation: Dominant left vertebral artery. Mild narrowing of the right vertebral artery at the dural penetration. Atheromatous irregularity of the posterior cerebral arteries, greater on the right. No high-grade proximal stenosis. Venous sinuses: Unremarkable Anatomic variants: Fetal type right PCA. Review of the MIP images confirms the above findings IMPRESSION: 1. No emergent vascular finding. 2. Atherosclerosis most notably causing advanced origin stenosis of the non dominant right vertebral artery. 3. Cervical lymphadenopathy correlating with chart history of CLL. No available comparison. Electronically Signed   By:  Tiburcio Pea M.D.   On: 10/26/2022 04:59   MR BRAIN WO CONTRAST  Result Date: 10/26/2022 CLINICAL DATA:  Initial evaluation for neuro deficit, stroke. EXAM: MRI HEAD WITHOUT CONTRAST TECHNIQUE: Multiplanar, multiecho pulse sequences of the brain and surrounding structures were obtained without intravenous contrast. COMPARISON:  Prior CT from earlier the same day. FINDINGS: Brain: Cerebral volume within normal limits. Patchy T2/FLAIR hyperintensity involving the periventricular deep white matter both cerebral hemispheres, consistent with chronic small vessel ischemic disease, moderately advanced in nature. Remote lacunar infarct  present at the left basal ganglia. No evidence for acute or subacute ischemia. No areas of chronic cortical infarction. No acute intracranial hemorrhage. Few scattered chronic micro hemorrhages noted about the left basal ganglia and thalami, likely hypertensive in nature. No mass lesion, midline shift or mass effect no hydrocephalus or extra-axial fluid collection. Pituitary gland and suprasellar region within normal limits. Vascular: Irregular flow void within the hypoplastic intradural right V4 segment, which could be related to slow flow and/or occlusion. Major intracranial vascular flow voids are otherwise maintained. Skull and upper cervical spine: Craniocervical junction within normal limits. Bone marrow signal intensity normal. No scalp soft tissue abnormality. Sinuses/Orbits: Prior bilateral ocular lens replacement. Scattered mucosal thickening about the ethmoidal air cells and maxillary sinuses with superimposed air-fluid levels within both maxillary sinuses. No significant mastoid effusion. Other: None. IMPRESSION: 1. No acute intracranial abnormality. 2. Moderately advanced chronic microvascular ischemic disease with remote left basal ganglia lacunar infarct. 3. Irregular flow void within the hypoplastic intradural right V4 segment, which could be related to slow flow and/or occlusion. 4. Acute bilateral maxillary sinusitis. Electronically Signed   By: Rise Mu M.D.   On: 10/26/2022 01:35   CT HEAD WO CONTRAST ( )  Result Date: 10/25/2022 CLINICAL DATA:  Neuro deficit, acute stroke suspected. EXAM: CT HEAD WITHOUT CONTRAST TECHNIQUE: Contiguous axial images were obtained from the base of the skull through the vertex without intravenous contrast. RADIATION DOSE REDUCTION: This exam was performed according to the departmental dose-optimization program which includes automated exposure control, adjustment of the mA and/or kV according to patient size and/or use of iterative reconstruction  technique. COMPARISON:  None Available. FINDINGS: Brain: No evidence of acute infarction, hemorrhage, hydrocephalus, extra-axial collection or mass lesion/mass effect. There is mild diffuse atrophy and mild periventricular white matter hypodensity, likely chronic small vessel ischemic change. There is an old lacunar infarct in the left basal ganglia. Vascular: Atherosclerotic calcifications are present within the cavernous internal carotid arteries. Skull: Normal. Negative for fracture or focal lesion. Sinuses/Orbits: There are air-fluid levels in the bilateral maxillary sinuses. Orbits are within normal limits. Other: None. IMPRESSION: 1. No acute intracranial process. 2. Mild diffuse atrophy and chronic small vessel ischemic change. 3. Air-fluid levels in the bilateral maxillary sinuses. Correlate for acute sinusitis. Electronically Signed   By: Darliss Cheney M.D.   On: 10/25/2022 21:59   DG Chest Portable 1 View  Result Date: 10/25/2022 CLINICAL DATA:  Cough and fatigue EXAM: PORTABLE CHEST 1 VIEW COMPARISON:  Chest radiograph 06/04/2022 FINDINGS: Stable cardiomediastinal silhouette. Aortic atherosclerotic calcification. No focal consolidation, pleural effusion, or pneumothorax. No displaced rib fractures. IMPRESSION: No active disease. Electronically Signed   By: Minerva Fester M.D.   On: 10/25/2022 21:32       LOS: 0 days   Amarise Lillo Rito Ehrlich  [facility] Pager on [email or web address]  10/26/2022, 8:44 AM

## 2022-10-26 NOTE — Progress Notes (Addendum)
STROKE TEAM PROGRESS NOTE   INTERVAL HISTORY Patient is seen in his room with his wife at the bedside.  Yesterday, he had a transient episode of garbled speech and expressive aphasia and gait disturbance which lasted about half an hour.  Son reports he had a somewhat similar but transient episode 2 weeks ago as well.  He was diagnosed with atrial fibrillation in January, but later it was thought that this was provoked by sepsis and dehydration and he was taken off of Eliquis.  His atrial fibrillation was asymptomatic and transient, and he did not undergo outpatient monitoring for this. MRI scan of the brain is negative for acute stroke.  CT angiogram shows moderate stenosis of the origin of the right vertebral artery. Vitals:   10/26/22 1000 10/26/22 1100 10/26/22 1152 10/26/22 1200  BP: (!) 149/76 (!) 166/85  (!) 148/92  Pulse: 93 84  84  Resp: 16 10  13   Temp:   97.9 F (36.6 C)   TempSrc:   Oral   SpO2: 100% 99%  97%  Weight:      Height:       CBC:  Recent Labs  Lab 10/25/22 2033 10/26/22 0224  WBC 72.1* 68.3*  NEUTROABS 7.2 1.3*  HGB 9.1* 9.8*  HCT 29.1* 30.5*  MCV 103.6* 104.1*  PLT 118* 111*   Basic Metabolic Panel:  Recent Labs  Lab 10/25/22 2033 10/26/22 0224  NA 135 134*  K 5.0 4.5  CL 98 102  CO2 25 25  GLUCOSE 107* 140*  BUN 12 11  CREATININE 1.40* 1.34*  CALCIUM 9.4 9.1   Lipid Panel:  Recent Labs  Lab 10/26/22 0224  CHOL 130  TRIG 258*  HDL 22*  CHOLHDL 5.9  VLDL 52*  LDLCALC 56   HgbA1c:  Recent Labs  Lab 10/26/22 0224  HGBA1C 6.2*   Urine Drug Screen: No results for input(s): "LABOPIA", "COCAINSCRNUR", "LABBENZ", "AMPHETMU", "THCU", "LABBARB" in the last 168 hours.  Alcohol Level No results for input(s): "ETH" in the last 168 hours.  IMAGING past 24 hours CT ANGIO HEAD NECK W WO CM  Result Date: 10/26/2022 CLINICAL DATA:  Stroke follow-up EXAM: CT ANGIOGRAPHY HEAD AND NECK WITH AND WITHOUT CONTRAST TECHNIQUE: Multidetector CT imaging of  the head and neck was performed using the standard protocol during bolus administration of intravenous contrast. Multiplanar CT image reconstructions and MIPs were obtained to evaluate the vascular anatomy. Carotid stenosis measurements (when applicable) are obtained utilizing NASCET criteria, using the distal internal carotid diameter as the denominator. RADIATION DOSE REDUCTION: This exam was performed according to the departmental dose-optimization program which includes automated exposure control, adjustment of the mA and/or kV according to patient size and/or use of iterative reconstruction technique. CONTRAST:  75mL OMNIPAQUE IOHEXOL 350 MG/ML SOLN COMPARISON:  Brain MRI from yesterday FINDINGS: CTA NECK FINDINGS Aortic arch: Atherosclerotic plaque with 3 vessel branching. No acute finding or dilatation Right carotid system: Diffuse atheromatous wall thickening but mild for age. No stenosis or ulceration Left carotid system: Generalized atheromatous wall thickening. No stenosis or ulceration Vertebral arteries: Left dominant vertebral artery. The vertebral and subclavian arteries show atherosclerosis more notable on the right where there is a severe origin stenosis with non measurable lumen on coronal reformats. No beading or dissection Skeleton: Generalized degenerative endplate and facet spurring. Other neck: Fluid levels in the bilateral maxillary sinus. Homogeneous enlargement of cervical lymph nodes throughout the bilateral neck, for example a left posterior triangle node measures 17 mm in length  on 7:127. Upper chest: Clear apical lungs Review of the MIP images confirms the above findings CTA HEAD FINDINGS Anterior circulation: No branch occlusion, beading, or aneurysm. Mild atheromatous calcification at the siphons. Mild atheromatous irregularity of ACA and MCA branches on thick MIPS. Posterior circulation: Dominant left vertebral artery. Mild narrowing of the right vertebral artery at the dural  penetration. Atheromatous irregularity of the posterior cerebral arteries, greater on the right. No high-grade proximal stenosis. Venous sinuses: Unremarkable Anatomic variants: Fetal type right PCA. Review of the MIP images confirms the above findings IMPRESSION: 1. No emergent vascular finding. 2. Atherosclerosis most notably causing advanced origin stenosis of the non dominant right vertebral artery. 3. Cervical lymphadenopathy correlating with chart history of CLL. No available comparison. Electronically Signed   By: Tiburcio Pea M.D.   On: 10/26/2022 04:59   MR BRAIN WO CONTRAST  Result Date: 10/26/2022 CLINICAL DATA:  Initial evaluation for neuro deficit, stroke. EXAM: MRI HEAD WITHOUT CONTRAST TECHNIQUE: Multiplanar, multiecho pulse sequences of the brain and surrounding structures were obtained without intravenous contrast. COMPARISON:  Prior CT from earlier the same day. FINDINGS: Brain: Cerebral volume within normal limits. Patchy T2/FLAIR hyperintensity involving the periventricular deep white matter both cerebral hemispheres, consistent with chronic small vessel ischemic disease, moderately advanced in nature. Remote lacunar infarct present at the left basal ganglia. No evidence for acute or subacute ischemia. No areas of chronic cortical infarction. No acute intracranial hemorrhage. Few scattered chronic micro hemorrhages noted about the left basal ganglia and thalami, likely hypertensive in nature. No mass lesion, midline shift or mass effect no hydrocephalus or extra-axial fluid collection. Pituitary gland and suprasellar region within normal limits. Vascular: Irregular flow void within the hypoplastic intradural right V4 segment, which could be related to slow flow and/or occlusion. Major intracranial vascular flow voids are otherwise maintained. Skull and upper cervical spine: Craniocervical junction within normal limits. Bone marrow signal intensity normal. No scalp soft tissue abnormality.  Sinuses/Orbits: Prior bilateral ocular lens replacement. Scattered mucosal thickening about the ethmoidal air cells and maxillary sinuses with superimposed air-fluid levels within both maxillary sinuses. No significant mastoid effusion. Other: None. IMPRESSION: 1. No acute intracranial abnormality. 2. Moderately advanced chronic microvascular ischemic disease with remote left basal ganglia lacunar infarct. 3. Irregular flow void within the hypoplastic intradural right V4 segment, which could be related to slow flow and/or occlusion. 4. Acute bilateral maxillary sinusitis. Electronically Signed   By: Rise Mu M.D.   On: 10/26/2022 01:35   CT HEAD WO CONTRAST ( )  Result Date: 10/25/2022 CLINICAL DATA:  Neuro deficit, acute stroke suspected. EXAM: CT HEAD WITHOUT CONTRAST TECHNIQUE: Contiguous axial images were obtained from the base of the skull through the vertex without intravenous contrast. RADIATION DOSE REDUCTION: This exam was performed according to the departmental dose-optimization program which includes automated exposure control, adjustment of the mA and/or kV according to patient size and/or use of iterative reconstruction technique. COMPARISON:  None Available. FINDINGS: Brain: No evidence of acute infarction, hemorrhage, hydrocephalus, extra-axial collection or mass lesion/mass effect. There is mild diffuse atrophy and mild periventricular white matter hypodensity, likely chronic small vessel ischemic change. There is an old lacunar infarct in the left basal ganglia. Vascular: Atherosclerotic calcifications are present within the cavernous internal carotid arteries. Skull: Normal. Negative for fracture or focal lesion. Sinuses/Orbits: There are air-fluid levels in the bilateral maxillary sinuses. Orbits are within normal limits. Other: None. IMPRESSION: 1. No acute intracranial process. 2. Mild diffuse atrophy and chronic small vessel ischemic change.  3. Air-fluid levels in the bilateral  maxillary sinuses. Correlate for acute sinusitis. Electronically Signed   By: Darliss Cheney M.D.   On: 10/25/2022 21:59   DG Chest Portable 1 View  Result Date: 10/25/2022 CLINICAL DATA:  Cough and fatigue EXAM: PORTABLE CHEST 1 VIEW COMPARISON:  Chest radiograph 06/04/2022 FINDINGS: Stable cardiomediastinal silhouette. Aortic atherosclerotic calcification. No focal consolidation, pleural effusion, or pneumothorax. No displaced rib fractures. IMPRESSION: No active disease. Electronically Signed   By: Minerva Fester M.D.   On: 10/25/2022 21:32    PHYSICAL EXAM General: Alert, well-nourished, well-developed elderly patient in no acute distress Respiratory: Regular, unlabored respirations on room air  NEURO:  Mental Status: AA&Ox3  Speech/Language: speech is without dysarthria or aphasia.    Cranial Nerves:  II: PERRL. Visual fields full.  III, IV, VI: EOMI. Eyelids elevate symmetrically.  V: Sensation is intact to light touch and symmetrical to face.  VII: Smile is symmetrical.  VIII: hearing intact to voice. IX, X: Phonation is normal.  XII: tongue is midline without fasciculations. Motor: 5/5 strength to all muscle groups tested.  Tone: is normal and bulk is normal Sensation- Intact to light touch bilaterally.  Coordination: FTN intact bilaterally.No drift.  Gait- deferred      ASSESSMENT/PLAN Mr. LINDEN PEPPLER is a 82 y.o. male with history of atrial fibrillation not on anticoagulation, hypertension, hyperlipidemia, diabetes and CLL presenting with a transient episode of aphasia and gait disturbance which lasted about half an hour.  He had a similar episode several months ago.  He was diagnosed with atrial fibrillation in January, but later it was thought that this was not present and he was taken off of Eliquis.  His atrial fibrillation was asymptomatic, and he did not undergo outpatient monitoring for this.  MRI shows no acute stroke.  TIA: Left hemispheric TIA Etiology: Likely  cardioembolic in setting of atrial fibrillation off anticoagulation CT head No acute abnormality. Small vessel disease. Atrophy.   CTA head & neck no LVO, origin stenosis of right vertebral artery and cervical lymphadenopathy MRI no acute abnormality 2D Echo EF 55-60% LDL 56 HgbA1c 6.2 VTE prophylaxis -SCDs    Diet   Diet Heart Room service appropriate? Yes; Fluid consistency: Thin   No antithrombotic prior to admission, now on Eliquis (apixaban) daily.  Therapy recommendations: Pending Disposition: Pending  Hypertension Home meds: none Stable Keep systolic blood pressure less than 180 Long-term BP goal normotensive  Hyperlipidemia Home meds: Rosuvastatin 5 mg daily LDL 56, goal < 70 High intensity statin not indicated as LDL below goal Continue statin at discharge  Diabetes type II Controlled Home meds: Glimepiride 2 mg daily, metformin 500 mg twice daily HgbA1c 6.2, goal < 7.0 CBGs Recent Labs    10/25/22 2233  GLUCAP 112*    SSI  Other Stroke Risk Factors Advanced Age >/= 69  Former cigarette smoker  Other Active Problems CLL-recommend follow-up with outpatient oncologist  Hospital day # 0  Cortney E Ernestina Columbia , MSN, AGACNP-BC [facility] See Amion for schedule and pager information 10/26/2022 12:42 PM  STROKE MD NOTE :  I have personally obtained history,examined this patient, reviewed notes, independently viewed imaging studies, participated in medical decision making and plan of care.ROS completed by me personally and pertinent positives fully documented  I have made any additions or clarifications directly to the above note. Agree with note above.  Patient presented with transient episode of garbled speech, expressive language difficulties and gait ataxia likely left hemispheric  TIA.  He has recent history of transient atrial fibrillation in the setting of sepsis hence he was taken off anticoagulation as it was felt to be a provoked event.  Case  discussed with his cardiologist Dr. Jacinto Halim over the phone who agrees with long-term anticoagulation with Eliquis and he may need further outpatient cardiac monitoring.  Continue ongoing stroke workup.  Aggressive risk factor modification.  Long discussion with patient and son at the bedside and with Dr. Rito Ehrlich.  Greater than 50% time during rest 50-minute visit were spent on counseling and coordination of care about its TIA and atrial fibrillation and discussion of about risk benefits of anticoagulation and stroke prevention and answering questions  Delia Heady, MD Medical Director [facility] Pager: [phone] 10/26/2022 3:33 PM   To contact Stroke Continuity provider, please refer to [email or web address]. After hours, contact General Neurology

## 2022-10-26 NOTE — Hospital Course (Signed)
Eric Harrington is a 82 y.o. male with medical history significant for PAF taken off Eliquis last month, CLL on active surveillance, CKD stage IIIa, T2DM, orthostatic hypotension on midodrine and Florinef, HLD who presented with transient expressive aphasia and admitted for CVA workup.

## 2022-10-26 NOTE — Consult Note (Signed)
Neurology Consultation Reason for Consult: Transient episode of aphasia Referring Physician: Terrilee Croak  CC: Transient episode of aphasia  History is obtained from: Patient  HPI: Eric Harrington is a 82 y.o. male with history of atrial fibrillation taken off of Eliquis last month as well as CLL who presents with transient episode of aphasia.  This evening, he states that he was talking, and felt like he knew what he wanted to say, and felt like the words coming out of his mouth were correct but no one else seem to think anything he was saying was anything other than gibberish.  He thinks is more than just slurred speech.  He denies any concurrent weakness or numbness.  Symptoms lasted for about 1/2-hour.  He was diagnosed with atrial fibrillation in January, but on repeated EKGs this was not present and therefore he was taken off of it.  On discussion with him, his atrial fibrillation was completely asymptomatic and therefore it is unclear if he is still having it.  Past Medical History:  Diagnosis Date   Chronic Leukemia    Diabetes mellitus without complication (HCC)    Hyperlipemia 12/02/2018   Hyperlipidemia    Hypertension    Type 2 diabetes mellitus with complication, without long-term current use of insulin (HCC) 12/02/2018     Family History  Problem Relation Age of Onset   Lung cancer Mother    Heart attack Father    Dementia Sister    Diabetes Brother    Diabetes Sister    Heart attack Brother      Social History:  reports that he quit smoking about 44 years ago. His smoking use included cigarettes. He has a 30.00 pack-year smoking history. He has never used smokeless tobacco. He reports that he does not currently use drugs. He reports that he does not drink alcohol.   Exam: Current vital signs: BP (!) 162/87   Pulse 83   Temp 98.4 F (36.9 C) (Oral)   Resp (!) 21   Ht 5\' 10"  (1.778 m)   Wt 75.3 kg   SpO2 100%   BMI 23.82 kg/m  Vital signs in last 24  hours: Temp:  [98.4 F (36.9 C)] 98.4 F (36.9 C) (06/06 2034) Pulse Rate:  [77-83] 83 (06/06 2230) Resp:  [19-21] 21 (06/06 2230) BP: (162-188)/(87) 162/87 (06/06 2230) SpO2:  [98 %-100 %] 100 % (06/06 2230) Weight:  [75.3 kg] 75.3 kg (06/06 2031)   Physical Exam  Appears well-developed and well-nourished.   Neuro: Mental Status: Patient is awake, alert, oriented to person, place, month, year, and situation. Patient is able to give a clear and coherent history. No signs of aphasia or neglect Cranial Nerves: II: Visual Fields are full. Pupils are equal, round, and reactive to light.   III,IV, VI: EOMI without ptosis or diploplia.  V: Facial sensation is symmetric to temperature VII: Facial movement is symmetric.  VIII: hearing is intact to voice X: Uvula elevates symmetrically XI: Shoulder shrug is symmetric. XII: tongue is midline without atrophy or fasciculations.  Motor: Tone is normal. Bulk is normal. 5/5 strength was present in all four extremities.  Sensory: Sensation is symmetric to light touch and temperature in the arms and legs. Deep Tendon Reflexes: 2+ and symmetric in the biceps and patellae.  Plantars: Toes are downgoing bilaterally.  Cerebellar: FNF and HKS are intact bilaterally   I have reviewed labs in epic and the results pertinent to this consultation are: CBC with a WBC of  72 Creatinine 1.4  I have reviewed the images obtained: MRI brain-negative  Impression: 82 year old male with transient episode of aphasia.  Given the preserved consciousness and preserve memory of the event, I do suspect that this represents transient ischemic attack.  With his history of atrial fibrillation, I would be very concerned that he is still having episodes of A-fib and agree with restarting his Eliquis.  He does have an elevated white count, but leukostasis events typically do not occur until approximately 100,000/uL WBC  Recommendations: - HgbA1c, fasting lipid  panel - Frequent neuro checks - Echocardiogram - CTA head and neck - Prophylactic therapy-Eliquis - Risk factor modification - Telemetry monitoring - PT consult, OT consult, Speech consult - Stroke team to follow    Ritta Slot, MD [facility] [phone]  If 7pm- 7am, please page neurology on call as listed in [facility].

## 2022-10-26 NOTE — Discharge Summary (Signed)
[facility]  Physician Discharge Summary   Patient ID: Eric Harrington MRN: [id number] DOB/AGE: 80-Jul-1942 80 y.o.  Admit date: 10/25/2022 Discharge date: 10/26/2022    PCP: Aliene Beams, MD  DISCHARGE DIAGNOSES:  TIA   PAF (paroxysmal atrial fibrillation) (HCC)   Type 2 diabetes mellitus with complication, without long-term current use of insulin (HCC)   CLL (chronic lymphocytic leukemia) (HCC)   Chronic kidney disease, stage 3a (HCC)   Hyperlipidemia associated with type 2 diabetes mellitus (HCC)   Orthostatic hypotension   RECOMMENDATIONS FOR OUTPATIENT FOLLOW UP: Cardiology to arrange outpatient follow-up Follow-up with PCP.      Home Health: None Equipment/Devices: None  CODE STATUS: Full code  DISCHARGE CONDITION: fair  Diet recommendation: As before  INITIAL HISTORY: 82 y.o. male with medical history significant for PAF taken off Eliquis last month, CLL on active surveillance, CKD stage IIIa, T2DM, orthostatic hypotension on midodrine and Florinef, HLD who presented to the ED for evaluation of transient aphasia.  Symptoms resolved less than an hour later.  Patient was hospitalized for further management.   Consultants: Neurology   Procedures: Echocardiogram    HOSPITAL COURSE:   TIA Patient had transient aphasia which has now resolved. MRI negative for stroke. CT angiogram head and neck was done.  Defer findings to neurology. LDL is 56.  Continue with statin. HbA1c 6.2. Echocardiogram shows normal systolic function. Seen by PT and OT Patient was recently taken off of Eliquis by cardiology.  Eliquis has been resumed per neurology input. Neurology to determine need for outpatient follow-up.  Paroxysmal atrial fibrillation Sinus rhythm noted on EKG.  Patient followed by Dr. Jacinto Halim with cardiology.  Was recently taken off of Eliquis since that he did not have any recurrence of A-fib.  His initial episode of A-fib was in the setting of an acute  illness. Now with of TIA he should be back on Eliquis.  Cardiology to see him in the office and determine need for either event monitor or loop recorder.    History of CLL/leukocytosis/microcytic anemia/thrombocytopenia Followed by Dr. Candise Che.  WBC is noted to be elevated.  Hemoglobin is stable.  No evidence of overt bleeding.  Mention of sinusitis on MRI but patient without any symptoms of same.   Chronic kidney disease stage IIIa Stable.  Avoid nephrotoxic agents.   Orthostatic hypotension On midodrine and Florinef.   Diabetes mellitus type 2, controlled   Patient is stable.  Okay for discharge home today.  PERTINENT LABS:  The results of significant diagnostics from this hospitalization (including imaging, microbiology, ancillary and laboratory) are listed below for reference.    Labs:   Basic Metabolic Panel: Recent Labs  Lab 10/25/22 2033 10/26/22 0224  NA 135 134*  K 5.0 4.5  CL 98 102  CO2 25 25  GLUCOSE 107* 140*  BUN 12 11  CREATININE 1.40* 1.34*  CALCIUM 9.4 9.1   Liver Function Tests: Recent Labs  Lab 10/25/22 2033  AST 28  ALT 17  ALKPHOS 70  BILITOT 0.6  PROT 5.8*  ALBUMIN 3.8    CBC: Recent Labs  Lab 10/25/22 2033 10/26/22 0224  WBC 72.1* 68.3*  NEUTROABS 7.2 1.3*  HGB 9.1* 9.8*  HCT 29.1* 30.5*  MCV 103.6* 104.1*  PLT 118* 111*     CBG: Recent Labs  Lab 10/25/22 2233 10/26/22 1538  GLUCAP 112* 147*     IMAGING STUDIES ECHOCARDIOGRAM COMPLETE  Result Date: 10/26/2022    ECHOCARDIOGRAM REPORT   Patient Name:  Rhona Leavens Brodowski Date of Exam: 10/26/2022 Medical Rec #:  [id number]     Height:       70.0 in Accession #:    [id number]    Weight:       166.0 lb Date of Birth:  1940-07-22     BSA:          1.928 m Patient Age:    48 years      BP:           121/96 mmHg Patient Gender: M             HR:           80 bpm. Exam Location:  Inpatient Procedure: 2D Echo, Cardiac Doppler and Color Doppler Indications:    TIA  History:        Patient  has prior history of Echocardiogram examinations, most                 recent 06/05/2022. TIA.  Sonographer:    Lucy Antigua Referring Phys: [id number] VISHAL R PATEL IMPRESSIONS  1. Left ventricular ejection fraction, by estimation, is 60 to 65%. The left ventricle has normal function. The left ventricle has no regional wall motion abnormalities. There is mild concentric left ventricular hypertrophy. Left ventricular diastolic parameters are consistent with Grade I diastolic dysfunction (impaired relaxation).  2. Right ventricular systolic function is normal. The right ventricular size is normal.  3. Left atrial size was mildly dilated.  4. The mitral valve is abnormal. Mild mitral valve regurgitation. No evidence of mitral stenosis.  5. The aortic valve is normal in structure. Aortic valve regurgitation is trivial. No aortic stenosis is present.  6. Aortic dilatation noted. There is borderline dilatation of the aortic root, measuring 38 mm.  7. The inferior vena cava is normal in size with greater than 50% respiratory variability, suggesting right atrial pressure of 3 mmHg. FINDINGS  Left Ventricle: Left ventricular ejection fraction, by estimation, is 60 to 65%. The left ventricle has normal function. The left ventricle has no regional wall motion abnormalities. The left ventricular internal cavity size was normal in size. There is  mild concentric left ventricular hypertrophy. Left ventricular diastolic parameters are consistent with Grade I diastolic dysfunction (impaired relaxation). Right Ventricle: The right ventricular size is normal. No increase in right ventricular wall thickness. Right ventricular systolic function is normal. Left Atrium: Left atrial size was mildly dilated. Right Atrium: Right atrial size was normal in size. Pericardium: There is no evidence of pericardial effusion. Mitral Valve: The mitral valve is abnormal. There is mild calcification of the mitral valve leaflet(s). Mild mitral valve  regurgitation. No evidence of mitral valve stenosis. Tricuspid Valve: The tricuspid valve is normal in structure. Tricuspid valve regurgitation is trivial. No evidence of tricuspid stenosis. Aortic Valve: The aortic valve is normal in structure. Aortic valve regurgitation is trivial. No aortic stenosis is present. Aortic valve mean gradient measures 3.0 mmHg. Aortic valve peak gradient measures 5.3 mmHg. Aortic valve area, by VTI measures 3.22 cm. Pulmonic Valve: The pulmonic valve was normal in structure. Pulmonic valve regurgitation is trivial. No evidence of pulmonic stenosis. Aorta: Aortic dilatation noted. There is borderline dilatation of the aortic root, measuring 38 mm. Venous: The inferior vena cava is normal in size with greater than 50% respiratory variability, suggesting right atrial pressure of 3 mmHg. IAS/Shunts: No atrial level shunt detected by color flow Doppler.  LEFT VENTRICLE PLAX 2D LVIDd:  4.30 cm   Diastology LVIDs:         2.80 cm   LV e' medial:    3.92 cm/s LV PW:         1.20 cm   LV E/e' medial:  21.9 LV IVS:        1.20 cm   LV e' lateral:   3.37 cm/s LVOT diam:     2.10 cm   LV E/e' lateral: 25.5 LV SV:         82 LV SV Index:   42 LVOT Area:     3.46 cm  RIGHT VENTRICLE RV S prime:     15.90 cm/s TAPSE (M-mode): 3.4 cm LEFT ATRIUM             Index        RIGHT ATRIUM           Index LA Vol (A2C):   59.1 ml 30.65 ml/m  RA Area:     15.50 cm LA Vol (A4C):   34.3 ml 17.79 ml/m  RA Volume:   37.00 ml  19.19 ml/m LA Biplane Vol: 47.3 ml 24.53 ml/m  AORTIC VALVE AV Area (Vmax):    2.81 cm AV Area (Vmean):   2.76 cm AV Area (VTI):     3.22 cm AV Vmax:           115.00 cm/s AV Vmean:          86.100 cm/s AV VTI:            0.254 m AV Peak Grad:      5.3 mmHg AV Mean Grad:      3.0 mmHg LVOT Vmax:         93.40 cm/s LVOT Vmean:        68.700 cm/s LVOT VTI:          0.236 m LVOT/AV VTI ratio: 0.93  AORTA Ao Root diam: 3.80 cm Ao Asc diam:  3.40 cm MITRAL VALVE MV Area (PHT):  5.02 cm     SHUNTS MV Decel Time: 151 msec     Systemic VTI:  0.24 m MR Peak grad: 50.4 mmHg     Systemic Diam: 2.10 cm MR Vmax:      355.00 cm/s MV E velocity: 85.90 cm/s MV A velocity: 112.00 cm/s MV E/A ratio:  0.77 Arvilla Meres MD Electronically signed by Arvilla Meres MD Signature Date/Time: 10/26/2022/3:44:04 PM    Final    CT ANGIO HEAD NECK W WO CM  Result Date: 10/26/2022 CLINICAL DATA:  Stroke follow-up EXAM: CT ANGIOGRAPHY HEAD AND NECK WITH AND WITHOUT CONTRAST TECHNIQUE: Multidetector CT imaging of the head and neck was performed using the standard protocol during bolus administration of intravenous contrast. Multiplanar CT image reconstructions and MIPs were obtained to evaluate the vascular anatomy. Carotid stenosis measurements (when applicable) are obtained utilizing NASCET criteria, using the distal internal carotid diameter as the denominator. RADIATION DOSE REDUCTION: This exam was performed according to the departmental dose-optimization program which includes automated exposure control, adjustment of the mA and/or kV according to patient size and/or use of iterative reconstruction technique. CONTRAST:  75mL OMNIPAQUE IOHEXOL 350 MG/ML SOLN COMPARISON:  Brain MRI from yesterday FINDINGS: CTA NECK FINDINGS Aortic arch: Atherosclerotic plaque with 3 vessel branching. No acute finding or dilatation Right carotid system: Diffuse atheromatous wall thickening but mild for age. No stenosis or ulceration Left carotid system: Generalized atheromatous wall thickening. No stenosis or ulceration Vertebral arteries: Left dominant vertebral  artery. The vertebral and subclavian arteries show atherosclerosis more notable on the right where there is a severe origin stenosis with non measurable lumen on coronal reformats. No beading or dissection Skeleton: Generalized degenerative endplate and facet spurring. Other neck: Fluid levels in the bilateral maxillary sinus. Homogeneous enlargement of cervical  lymph nodes throughout the bilateral neck, for example a left posterior triangle node measures 17 mm in length on 7:127. Upper chest: Clear apical lungs Review of the MIP images confirms the above findings CTA HEAD FINDINGS Anterior circulation: No branch occlusion, beading, or aneurysm. Mild atheromatous calcification at the siphons. Mild atheromatous irregularity of ACA and MCA branches on thick MIPS. Posterior circulation: Dominant left vertebral artery. Mild narrowing of the right vertebral artery at the dural penetration. Atheromatous irregularity of the posterior cerebral arteries, greater on the right. No high-grade proximal stenosis. Venous sinuses: Unremarkable Anatomic variants: Fetal type right PCA. Review of the MIP images confirms the above findings IMPRESSION: 1. No emergent vascular finding. 2. Atherosclerosis most notably causing advanced origin stenosis of the non dominant right vertebral artery. 3. Cervical lymphadenopathy correlating with chart history of CLL. No available comparison. Electronically Signed   By: Tiburcio Pea M.D.   On: 10/26/2022 04:59   MR BRAIN WO CONTRAST  Result Date: 10/26/2022 CLINICAL DATA:  Initial evaluation for neuro deficit, stroke. EXAM: MRI HEAD WITHOUT CONTRAST TECHNIQUE: Multiplanar, multiecho pulse sequences of the brain and surrounding structures were obtained without intravenous contrast. COMPARISON:  Prior CT from earlier the same day. FINDINGS: Brain: Cerebral volume within normal limits. Patchy T2/FLAIR hyperintensity involving the periventricular deep white matter both cerebral hemispheres, consistent with chronic small vessel ischemic disease, moderately advanced in nature. Remote lacunar infarct present at the left basal ganglia. No evidence for acute or subacute ischemia. No areas of chronic cortical infarction. No acute intracranial hemorrhage. Few scattered chronic micro hemorrhages noted about the left basal ganglia and thalami, likely  hypertensive in nature. No mass lesion, midline shift or mass effect no hydrocephalus or extra-axial fluid collection. Pituitary gland and suprasellar region within normal limits. Vascular: Irregular flow void within the hypoplastic intradural right V4 segment, which could be related to slow flow and/or occlusion. Major intracranial vascular flow voids are otherwise maintained. Skull and upper cervical spine: Craniocervical junction within normal limits. Bone marrow signal intensity normal. No scalp soft tissue abnormality. Sinuses/Orbits: Prior bilateral ocular lens replacement. Scattered mucosal thickening about the ethmoidal air cells and maxillary sinuses with superimposed air-fluid levels within both maxillary sinuses. No significant mastoid effusion. Other: None. IMPRESSION: 1. No acute intracranial abnormality. 2. Moderately advanced chronic microvascular ischemic disease with remote left basal ganglia lacunar infarct. 3. Irregular flow void within the hypoplastic intradural right V4 segment, which could be related to slow flow and/or occlusion. 4. Acute bilateral maxillary sinusitis. Electronically Signed   By: Rise Mu M.D.   On: 10/26/2022 01:35   CT HEAD WO CONTRAST ( )  Result Date: 10/25/2022 CLINICAL DATA:  Neuro deficit, acute stroke suspected. EXAM: CT HEAD WITHOUT CONTRAST TECHNIQUE: Contiguous axial images were obtained from the base of the skull through the vertex without intravenous contrast. RADIATION DOSE REDUCTION: This exam was performed according to the departmental dose-optimization program which includes automated exposure control, adjustment of the mA and/or kV according to patient size and/or use of iterative reconstruction technique. COMPARISON:  None Available. FINDINGS: Brain: No evidence of acute infarction, hemorrhage, hydrocephalus, extra-axial collection or mass lesion/mass effect. There is mild diffuse atrophy and mild periventricular white matter hypodensity,  likely chronic small vessel ischemic change. There is an old lacunar infarct in the left basal ganglia. Vascular: Atherosclerotic calcifications are present within the cavernous internal carotid arteries. Skull: Normal. Negative for fracture or focal lesion. Sinuses/Orbits: There are air-fluid levels in the bilateral maxillary sinuses. Orbits are within normal limits. Other: None. IMPRESSION: 1. No acute intracranial process. 2. Mild diffuse atrophy and chronic small vessel ischemic change. 3. Air-fluid levels in the bilateral maxillary sinuses. Correlate for acute sinusitis. Electronically Signed   By: Darliss Cheney M.D.   On: 10/25/2022 21:59   DG Chest Portable 1 View  Result Date: 10/25/2022 CLINICAL DATA:  Cough and fatigue EXAM: PORTABLE CHEST 1 VIEW COMPARISON:  Chest radiograph 06/04/2022 FINDINGS: Stable cardiomediastinal silhouette. Aortic atherosclerotic calcification. No focal consolidation, pleural effusion, or pneumothorax. No displaced rib fractures. IMPRESSION: No active disease. Electronically Signed   By: Minerva Fester M.D.   On: 10/25/2022 21:32    DISCHARGE EXAMINATION: See progress note from earlier today  DISPOSITION: Home  Discharge Instructions     Call MD for:  difficulty breathing, headache or visual disturbances   Complete by: As directed    Call MD for:  extreme fatigue   Complete by: As directed    Call MD for:  persistant dizziness or light-headedness   Complete by: As directed    Call MD for:  persistant nausea and vomiting   Complete by: As directed    Call MD for:  severe uncontrolled pain   Complete by: As directed    Call MD for:  temperature >100.4   Complete by: As directed    Diet - low sodium heart healthy   Complete by: As directed    Discharge instructions   Complete by: As directed    Please take your medications as prescribed.  You were cared for by a hospitalist during your hospital stay. If you have any questions about your discharge  medications or the care you received while you were in the hospital after you are discharged, you can call the unit and asked to speak with the hospitalist on call if the hospitalist that took care of you is not available. Once you are discharged, your primary care physician will handle any further medical issues. Please note that NO REFILLS for any discharge medications will be authorized once you are discharged, as it is imperative that you return to your primary care physician (or establish a relationship with a primary care physician if you do not have one) for your aftercare needs so that they can reassess your need for medications and monitor your lab values. If you do not have a primary care physician, you can call [phone] for a physician referral.   Increase activity slowly   Complete by: As directed          Allergies as of 10/26/2022       Reactions   Norvasc [amlodipine Besylate] Rash   Fatigue  Fever  Chills    Augmentin [amoxicillin-pot Clavulanate] Other (See Comments)   Stomach upset   Bystolic [nebivolol Hcl] Other (See Comments)   Weakness    Coq10 [coenzyme Q10] Other (See Comments)   Unknown reaction   Cozaar [losartan] Other (See Comments)   Unknown reaction   Farxiga [dapagliflozin] Other (See Comments)   Excessive urination Dehydration   Glucotrol [glipizide] Other (See Comments)   Unknown reaction   Januvia [sitagliptin] Other (See Comments)   Unknown reaction   Keflex [cephalexin] Other (See Comments)   Unknown  reaction   Lipitor [atorvastatin Calcium]    Lipitor [atorvastatin] Other (See Comments)   Unknown reaction   Lopid [gemfibrozil] Other (See Comments)   Unknown reaction   Omnicef [cefdinir] Other (See Comments)   Unknown reaction   Prilosec [omeprazole] Other (See Comments)   Unknown reaction   Toprol Xl [metoprolol] Other (See Comments)   Weakness    Vibra-tab [doxycycline] Other (See Comments)   Fever  Chills   Zestril [lisinopril] Other  (See Comments)   Constipation    Zetia [ezetimibe] Other (See Comments)   Unknown reaction   Zocor [simvastatin] Other (See Comments)   Unknown reaction   Neurontin [gabapentin] Other (See Comments)   Unknown reaction        Medication List     TAKE these medications    acetaminophen 500 MG tablet Commonly known as: TYLENOL Take 1,000 mg by mouth every 8 (eight) hours as needed for moderate pain, fever or headache.   albuterol 108 (90 Base) MCG/ACT inhaler Commonly known as: VENTOLIN HFA Inhale 1 puff into the lungs every 4 (four) hours as needed for wheezing or shortness of breath.   apixaban 5 MG Tabs tablet Commonly known as: ELIQUIS Take 1 tablet (5 mg total) by mouth 2 (two) times daily.   ASCORBIC ACID PO Take 1 tablet by mouth daily. Vitamin C.   cetirizine 10 MG tablet Commonly known as: ZYRTEC Take 10 mg by mouth daily as needed for allergies.   docusate sodium 100 MG capsule Commonly known as: COLACE Take 100 mg by mouth daily.   FIBER PO Take 3 tablets by mouth daily.   fludrocortisone 0.1 MG tablet Commonly known as: FLORINEF Take 1 tablet (0.1 mg total) by mouth every evening. What changed:  when to take this additional instructions   fluticasone 50 MCG/ACT nasal spray Commonly known as: FLONASE Place 1 spray into both nostrils daily as needed for allergies.   glimepiride 2 MG tablet Commonly known as: AMARYL Take 2 mg by mouth daily with breakfast.   metFORMIN 1000 MG tablet Commonly known as: GLUCOPHAGE Take 0.5 tablets (500 mg total) by mouth 2 (two) times daily. MAY RESUME ON 10/29/22 Start taking on: October 29, 2022 What changed:  additional instructions These instructions start on October 29, 2022. If you are unsure what to do until then, ask your doctor or other care provider.   midodrine 10 MG tablet Commonly known as: PROAMATINE Take 1 tablet (10 mg total) by mouth 3 (three) times daily with meals. While awake. Do not lay down for  3-4 hours after taking What changed:  when to take this additional instructions   NexIUM 24HR 20 MG capsule Generic drug: esomeprazole Take 20 mg by mouth daily at 12 noon.   rosuvastatin 5 MG tablet Commonly known as: CRESTOR Take 5 mg by mouth every evening.   SYSTANE OP Place 1 drop into both eyes 2 (two) times daily as needed (dryness, irritation.).   VITAMIN B-6 PO Take 1 tablet by mouth daily.   VITAMIN D-3 PO Take 1 capsule by mouth daily.   ZINC PO Take 1 tablet by mouth daily.          Follow-up Information     Aliene Beams, MD. Schedule an appointment as soon as possible for a visit in 1 week(s).   Specialty: Family Medicine Why: post hospitalization follow up Contact information: [address] [place] [address] [phone]         Yates Decamp, MD  Follow up.   Specialty: Cardiology Contact information: [address] [place] [address] [phone]                 TOTAL DISCHARGE TIME: 35 minutes  Sita Mangen Rito Ehrlich  [facility] Pager on [email or web address]  10/27/2022, 10:43 AM

## 2022-10-26 NOTE — Progress Notes (Signed)
OT Cancellation Note and Discharged  Patient Details Name: Eric Harrington MRN: [id number] DOB: 03-Jul-1940   Cancelled Treatment:    Reason Eval/Treat Not Completed: OT screened, no needs identified, will sign off. Received secure chat from evaluating that pt was back as his baseline.  Lindon Romp OT Acute Rehabilitation Services Office [phone]    Evette Georges 10/26/2022, 10:46 AM

## 2022-10-26 NOTE — ED Notes (Signed)
ED TO INPATIENT HANDOFF REPORT  ED Nurse Name and Phone #: Beatris Ship RN [phone]  S Name/Age/Gender Eric Harrington 82 y.o. male Room/Bed: 011C/011C  Code Status   Code Status: Full Code  Home/SNF/Other Home Patient oriented to: self, place, time, and situation Is this baseline? Yes   Triage Complete: Triage complete  Chief Complaint Expressive aphasia [R47.01]  Triage Note Pt arrives via [facility] from home d/t concern for stroke like symptoms. Onset of symptoms 6:45 pm. EMS report wife noticed slurred speech, difficulty walking, difficulty finding his words. S/S lasting 10 mins. Resolved prior to EMS arrival.     Allergies Allergies  Allergen Reactions   Amlodipine Besylate Rash    Fatigue, chills, fever   Bystolic [Nebivolol Hcl]    Cephalexin    Doxycycline     Fever/chills   Gemfibrozil    Glipizide    Januvia [Sitagliptin]    Lipitor [Atorvastatin Calcium]    Lisinopril Other (See Comments)    Pt states lisinopril causes severe constipation    Losartan    Metoprolol Other (See Comments)    weakness   Omeprazole    Simvastatin    Gabapentin     Level of Care/Admitting Diagnosis ED Disposition     ED Disposition  Admit   Condition  --   Comment  Hospital Area: [facility] [[id number]]  Level of Care: Telemetry Medical [104]  May place patient in observation at [facility] or [facility] if equivalent level of care is available:: No  Covid Evaluation: Asymptomatic - no recent exposure (last 10 days) testing not required  Diagnosis: Expressive aphasia [637005]  Admitting Physician: Charlsie Quest [[id number]]  Attending Physician: Charlsie Quest [[id number]]          B Medical/Surgery History Past Medical History:  Diagnosis Date   Chronic Leukemia    Diabetes mellitus without complication (HCC)    Hyperlipemia 12/02/2018   Hyperlipidemia    Hypertension    Type 2 diabetes mellitus with complication, without long-term current use of  insulin (HCC) 12/02/2018   Past Surgical History:  Procedure Laterality Date   CARDIAC CATHETERIZATION  2017     A IV Location/Drains/Wounds Patient Lines/Drains/Airways Status     Active Line/Drains/Airways     Name Placement date Placement time Site Days   Peripheral IV 10/25/22 20 G Left;Anterior 10/25/22  2028  --  1            Intake/Output Last 24 hours No intake or output data in the 24 hours ending 10/26/22 1149  Labs/Imaging Results for orders placed or performed during the hospital encounter of 10/25/22 (from the past 48 hour(s))  CBC with Differential     Status: Abnormal   Collection Time: 10/25/22  8:33 PM  Result Value Ref Range   WBC 72.1 (HH) 4.0 - 10.5 K/uL    Comment: REPEATED TO VERIFY THIS CRITICAL RESULT HAS VERIFIED AND BEEN CALLED TO CAMERON COBB RN BY SHERRY GALLOWAY ON 06 06 2024 AT 2159, AND HAS BEEN READ BACK.     RBC 2.81 (L) 4.22 - 5.81 MIL/uL   Hemoglobin 9.1 (L) 13.0 - 17.0 g/dL   HCT 78.2 (L) 95.6 - 21.3 %   MCV 103.6 (H) 80.0 - 100.0 fL   MCH 32.4 26.0 - 34.0 pg   MCHC 31.3 30.0 - 36.0 g/dL   RDW 08.6 57.8 - 46.9 %   Platelets 118 (L) 150 - 400 K/uL    Comment: REPEATED  TO VERIFY   nRBC 0.0 0.0 - 0.2 %   Neutrophils Relative % 10 %   Neutro Abs 7.2 1.7 - 7.7 K/uL   Lymphocytes Relative 89 %   Lymphs Abs 64.2 (H) 0.7 - 4.0 K/uL   Monocytes Relative 1 %   Monocytes Absolute 0.7 0.1 - 1.0 K/uL   Eosinophils Relative 0 %   Eosinophils Absolute 0.0 0.0 - 0.5 K/uL   Basophils Relative 0 %   Basophils Absolute 0.0 0.0 - 0.1 K/uL   nRBC 0 0 /100 WBC   Abs Immature Granulocytes 0.00 0.00 - 0.07 K/uL    Comment: Performed at [facility], [address] [place], [place] [address]  Comprehensive metabolic panel     Status: Abnormal   Collection Time: 10/25/22  8:33 PM  Result Value Ref Range   Sodium 135 135 - 145 mmol/L   Potassium 5.0 3.5 - 5.1 mmol/L   Chloride 98 98 - 111 mmol/L   CO2 25 22 - 32 mmol/L   Glucose, Bld 107  (H) 70 - 99 mg/dL    Comment: Glucose reference range applies only to samples taken after fasting for at least 8 hours.   BUN 12 8 - 23 mg/dL   Creatinine, Ser 6.04 (H) 0.61 - 1.24 mg/dL   Calcium 9.4 8.9 - 54.0 mg/dL   Total Protein 5.8 (L) 6.5 - 8.1 g/dL   Albumin 3.8 3.5 - 5.0 g/dL   AST 28 15 - 41 U/L   ALT 17 0 - 44 U/L   Alkaline Phosphatase 70 38 - 126 U/L   Total Bilirubin 0.6 0.3 - 1.2 mg/dL   GFR, Estimated 51 (L) >60 mL/min    Comment: (NOTE) Calculated using the CKD-EPI Creatinine Equation (2021)    Anion gap 12 5 - 15    Comment: Performed at [facility], [address] [place], [place] [address]  Troponin I (High Sensitivity)     Status: None   Collection Time: 10/25/22  8:33 PM  Result Value Ref Range   Troponin I (High Sensitivity) 7 <18 ng/L    Comment: (NOTE) Elevated high sensitivity troponin I (hsTnI) values and significant  changes across serial measurements may suggest ACS but many other  chronic and acute conditions are known to elevate hsTnI results.  Refer to the "Links" section for chest pain algorithms and additional  guidance. Performed at [facility], [address] [place], [place] [address]   CBG monitoring, ED     Status: Abnormal   Collection Time: 10/25/22 10:33 PM  Result Value Ref Range   Glucose-Capillary 112 (H) 70 - 99 mg/dL    Comment: Glucose reference range applies only to samples taken after fasting for at least 8 hours.  Lipid panel     Status: Abnormal   Collection Time: 10/26/22  2:24 AM  Result Value Ref Range   Cholesterol 130 0 - 200 mg/dL   Triglycerides 956 (H) <150 mg/dL   HDL 22 (L) >21 mg/dL   Total CHOL/HDL Ratio 5.9 RATIO   VLDL 52 (H) 0 - 40 mg/dL   LDL Cholesterol 56 0 - 99 mg/dL    Comment:        Total Cholesterol/HDL:CHD Risk Coronary Heart Disease Risk Table                     Men   Women  1/2 Average Risk   3.4   3.3  Average Risk       5.0   4.4  2 X Average Risk   9.6   7.1  3 X  Average Risk  23.4   11.0        Use the calculated Patient Ratio above and the CHD Risk Table to determine the patient's CHD Risk.        ATP III CLASSIFICATION (LDL):  <100     mg/dL   Optimal  161-096  mg/dL   Near or Above                    Optimal  130-159  mg/dL   Borderline  045-409  mg/dL   High  >811     mg/dL   Very High Performed at [facility], [address] [place], [place] [address]   Hemoglobin A1c     Status: Abnormal   Collection Time: 10/26/22  2:24 AM  Result Value Ref Range   Hgb A1c MFr Bld 6.2 (H) 4.8 - 5.6 %    Comment: (NOTE) Pre diabetes:          5.7%-6.4%  Diabetes:              >6.4%  Glycemic control for   <7.0% adults with diabetes    Mean Plasma Glucose 131.24 mg/dL    Comment: Performed at [facility], [address], [place], [place] [address]  Basic metabolic panel     Status: Abnormal   Collection Time: 10/26/22  2:24 AM  Result Value Ref Range   Sodium 134 (L) 135 - 145 mmol/L   Potassium 4.5 3.5 - 5.1 mmol/L   Chloride 102 98 - 111 mmol/L   CO2 25 22 - 32 mmol/L   Glucose, Bld 140 (H) 70 - 99 mg/dL    Comment: Glucose reference range applies only to samples taken after fasting for at least 8 hours.   BUN 11 8 - 23 mg/dL   Creatinine, Ser 1.30 (H) 0.61 - 1.24 mg/dL   Calcium 9.1 8.9 - 86.5 mg/dL   GFR, Estimated 54 (L) >60 mL/min    Comment: (NOTE) Calculated using the CKD-EPI Creatinine Equation (2021)    Anion gap 7 5 - 15    Comment: Performed at [facility], [address] [place], [place] [address]  CBC with Differential/Platelet     Status: Abnormal   Collection Time: 10/26/22  2:24 AM  Result Value Ref Range   WBC 68.3 (HH) 4.0 - 10.5 K/uL    Comment: CRITICAL VALUE NOTED.  VALUE IS CONSISTENT WITH PREVIOUSLY REPORTED AND CALLED VALUE. REPEATED TO VERIFY    RBC 2.93 (L) 4.22 - 5.81 MIL/uL   Hemoglobin 9.8 (L) 13.0 - 17.0 g/dL   HCT 62.9 (L) 52.8 - 41.3 %   MCV 104.1 (H) 80.0 - 100.0 fL   MCH  33.4 26.0 - 34.0 pg   MCHC 32.1 30.0 - 36.0 g/dL   RDW 24.4 01.0 - 27.2 %   Platelets 111 (L) 150 - 400 K/uL    Comment: REPEATED TO VERIFY   nRBC 0.0 0.0 - 0.2 %   Neutrophils Relative % 2 %   Neutro Abs 1.3 (L) 1.7 - 7.7 K/uL   Lymphocytes Relative 92 %   Lymphs Abs 62.8 (H) 0.7 - 4.0 K/uL   Monocytes Relative 6 %   Monocytes Absolute 4.0 (H) 0.1 - 1.0 K/uL   Eosinophils Relative 0 %  Eosinophils Absolute 0.0 0.0 - 0.5 K/uL   Basophils Relative 0 %   Basophils Absolute 0.2 (H) 0.0 - 0.1 K/uL   Immature Granulocytes 0 %   Abs Immature Granulocytes 0.03 0.00 - 0.07 K/uL    Comment: Performed at [facility], [address], [place], [place] [address]   CT ANGIO HEAD NECK W WO CM  Result Date: 10/26/2022 CLINICAL DATA:  Stroke follow-up EXAM: CT ANGIOGRAPHY HEAD AND NECK WITH AND WITHOUT CONTRAST TECHNIQUE: Multidetector CT imaging of the head and neck was performed using the standard protocol during bolus administration of intravenous contrast. Multiplanar CT image reconstructions and MIPs were obtained to evaluate the vascular anatomy. Carotid stenosis measurements (when applicable) are obtained utilizing NASCET criteria, using the distal internal carotid diameter as the denominator. RADIATION DOSE REDUCTION: This exam was performed according to the departmental dose-optimization program which includes automated exposure control, adjustment of the mA and/or kV according to patient size and/or use of iterative reconstruction technique. CONTRAST:  75mL OMNIPAQUE IOHEXOL 350 MG/ML SOLN COMPARISON:  Brain MRI from yesterday FINDINGS: CTA NECK FINDINGS Aortic arch: Atherosclerotic plaque with 3 vessel branching. No acute finding or dilatation Right carotid system: Diffuse atheromatous wall thickening but mild for age. No stenosis or ulceration Left carotid system: Generalized atheromatous wall thickening. No stenosis or ulceration Vertebral arteries: Left dominant vertebral artery. The  vertebral and subclavian arteries show atherosclerosis more notable on the right where there is a severe origin stenosis with non measurable lumen on coronal reformats. No beading or dissection Skeleton: Generalized degenerative endplate and facet spurring. Other neck: Fluid levels in the bilateral maxillary sinus. Homogeneous enlargement of cervical lymph nodes throughout the bilateral neck, for example a left posterior triangle node measures 17 mm in length on 7:127. Upper chest: Clear apical lungs Review of the MIP images confirms the above findings CTA HEAD FINDINGS Anterior circulation: No branch occlusion, beading, or aneurysm. Mild atheromatous calcification at the siphons. Mild atheromatous irregularity of ACA and MCA branches on thick MIPS. Posterior circulation: Dominant left vertebral artery. Mild narrowing of the right vertebral artery at the dural penetration. Atheromatous irregularity of the posterior cerebral arteries, greater on the right. No high-grade proximal stenosis. Venous sinuses: Unremarkable Anatomic variants: Fetal type right PCA. Review of the MIP images confirms the above findings IMPRESSION: 1. No emergent vascular finding. 2. Atherosclerosis most notably causing advanced origin stenosis of the non dominant right vertebral artery. 3. Cervical lymphadenopathy correlating with chart history of CLL. No available comparison. Electronically Signed   By: Tiburcio Pea M.D.   On: 10/26/2022 04:59   MR BRAIN WO CONTRAST  Result Date: 10/26/2022 CLINICAL DATA:  Initial evaluation for neuro deficit, stroke. EXAM: MRI HEAD WITHOUT CONTRAST TECHNIQUE: Multiplanar, multiecho pulse sequences of the brain and surrounding structures were obtained without intravenous contrast. COMPARISON:  Prior CT from earlier the same day. FINDINGS: Brain: Cerebral volume within normal limits. Patchy T2/FLAIR hyperintensity involving the periventricular deep white matter both cerebral hemispheres, consistent with  chronic small vessel ischemic disease, moderately advanced in nature. Remote lacunar infarct present at the left basal ganglia. No evidence for acute or subacute ischemia. No areas of chronic cortical infarction. No acute intracranial hemorrhage. Few scattered chronic micro hemorrhages noted about the left basal ganglia and thalami, likely hypertensive in nature. No mass lesion, midline shift or mass effect no hydrocephalus or extra-axial fluid collection. Pituitary gland and suprasellar region within normal limits. Vascular: Irregular flow void within the hypoplastic intradural right V4 segment,  which could be related to slow flow and/or occlusion. Major intracranial vascular flow voids are otherwise maintained. Skull and upper cervical spine: Craniocervical junction within normal limits. Bone marrow signal intensity normal. No scalp soft tissue abnormality. Sinuses/Orbits: Prior bilateral ocular lens replacement. Scattered mucosal thickening about the ethmoidal air cells and maxillary sinuses with superimposed air-fluid levels within both maxillary sinuses. No significant mastoid effusion. Other: None. IMPRESSION: 1. No acute intracranial abnormality. 2. Moderately advanced chronic microvascular ischemic disease with remote left basal ganglia lacunar infarct. 3. Irregular flow void within the hypoplastic intradural right V4 segment, which could be related to slow flow and/or occlusion. 4. Acute bilateral maxillary sinusitis. Electronically Signed   By: Rise Mu M.D.   On: 10/26/2022 01:35   CT HEAD WO CONTRAST ( )  Result Date: 10/25/2022 CLINICAL DATA:  Neuro deficit, acute stroke suspected. EXAM: CT HEAD WITHOUT CONTRAST TECHNIQUE: Contiguous axial images were obtained from the base of the skull through the vertex without intravenous contrast. RADIATION DOSE REDUCTION: This exam was performed according to the departmental dose-optimization program which includes automated exposure control,  adjustment of the mA and/or kV according to patient size and/or use of iterative reconstruction technique. COMPARISON:  None Available. FINDINGS: Brain: No evidence of acute infarction, hemorrhage, hydrocephalus, extra-axial collection or mass lesion/mass effect. There is mild diffuse atrophy and mild periventricular white matter hypodensity, likely chronic small vessel ischemic change. There is an old lacunar infarct in the left basal ganglia. Vascular: Atherosclerotic calcifications are present within the cavernous internal carotid arteries. Skull: Normal. Negative for fracture or focal lesion. Sinuses/Orbits: There are air-fluid levels in the bilateral maxillary sinuses. Orbits are within normal limits. Other: None. IMPRESSION: 1. No acute intracranial process. 2. Mild diffuse atrophy and chronic small vessel ischemic change. 3. Air-fluid levels in the bilateral maxillary sinuses. Correlate for acute sinusitis. Electronically Signed   By: Darliss Cheney M.D.   On: 10/25/2022 21:59   DG Chest Portable 1 View  Result Date: 10/25/2022 CLINICAL DATA:  Cough and fatigue EXAM: PORTABLE CHEST 1 VIEW COMPARISON:  Chest radiograph 06/04/2022 FINDINGS: Stable cardiomediastinal silhouette. Aortic atherosclerotic calcification. No focal consolidation, pleural effusion, or pneumothorax. No displaced rib fractures. IMPRESSION: No active disease. Electronically Signed   By: Minerva Fester M.D.   On: 10/25/2022 21:32    Pending Labs Unresulted Labs (From admission, onward)    None       Vitals/Pain Today's Vitals   10/26/22 0800 10/26/22 0900 10/26/22 1000 10/26/22 1100  BP: (!) 140/78 135/75 (!) 149/76 (!) 166/85  Pulse: 95 91 93 84  Resp: 19 15 16 10   Temp:      TempSrc:      SpO2: 99% 100% 100% 99%  Weight:      Height:      PainSc:        Isolation Precautions No active isolations  Medications Medications   stroke: early stages of recovery book (has no administration in time range)   acetaminophen (TYLENOL) tablet 650 mg (has no administration in time range)    Or  acetaminophen (TYLENOL) 160 MG/5ML solution 650 mg (has no administration in time range)    Or  acetaminophen (TYLENOL) suppository 650 mg (has no administration in time range)  senna-docusate (Senokot-S) tablet 1 tablet (has no administration in time range)  ondansetron (ZOFRAN) injection 4 mg (has no administration in time range)  apixaban (ELIQUIS) tablet 5 mg (5 mg Oral Given 10/26/22 0911)  fludrocortisone (FLORINEF) tablet 0.1 mg (has no administration in  time range)  rosuvastatin (CRESTOR) tablet 5 mg (5 mg Oral Given 10/26/22 0911)  iohexol (OMNIPAQUE) 350 MG/ML injection 75 mL (75 mLs Intravenous Contrast Given 10/26/22 0448)    Mobility walks     Focused Assessments Neuro Assessment Handoff:  Swallow screen pass? Yes  Cardiac Rhythm: Normal sinus rhythm NIH Stroke Scale  Dizziness Present: No Headache Present: No Interval: Shift assessment Level of Consciousness (1a.)   : Alert, keenly responsive LOC Questions (1b. )   : Answers both questions correctly LOC Commands (1c. )   : Performs both tasks correctly Best Gaze (2. )  : Normal Visual (3. )  : No visual loss Facial Palsy (4. )    : Normal symmetrical movements Motor Arm, Left (5a. )   : No drift Motor Arm, Right (5b. ) : No drift Motor Leg, Left (6a. )  : No drift Motor Leg, Right (6b. ) : No drift Limb Ataxia (7. ): Absent Sensory (8. )  : Normal, no sensory loss Best Language (9. )  : No aphasia Dysarthria (10. ): Normal Extinction/Inattention (11.)   : No Abnormality Complete NIHSS TOTAL: 0     Neuro Assessment: Within Defined Limits Neuro Checks:   Initial (10/25/22 2112)  Has TPA been given? No If patient is a Neuro Trauma and patient is going to OR before floor call report to 4N Charge nurse: [phone] or [phone]   R Recommendations: See Admitting Provider Note  Report given to: [phone]

## 2022-10-26 NOTE — Progress Notes (Signed)
  Echocardiogram 2D Echocardiogram has been performed.  Alynna Hargrove Wynn Banker 10/26/2022, 3:20 PM

## 2022-10-29 ENCOUNTER — Telehealth: Payer: Self-pay

## 2022-10-29 NOTE — Telephone Encounter (Signed)
TOC Call :   Location of hospitalization: [place] Reason for hospitalization: [facility] [facility] Date of discharge: slurred speech, weakness, headache Date of first communication with patient: today Person contacting patient: Erby Pian, CMA Current symptoms: none Do you understand why you were in the Hospital: Yes Questions regarding discharge instructions: None Where were you discharged to: Home Medications reviewed: Yes Allergies reviewed: Yes Dietary changes reviewed: Yes. Discussed low fat and low salt diet.  Referals reviewed: NA Activities of Daily Living: Able to with mild limitations Any transportation issues/concerns: None Any patient concerns: None Confirmed importance & date/time of Follow up appt: Yes Confirmed with patient if condition begins to worsen call. Pt was given the office number and encouraged to call back with questions or concerns: Yes

## 2022-10-31 ENCOUNTER — Ambulatory Visit: Payer: Medicare Other | Admitting: Cardiology

## 2022-11-05 ENCOUNTER — Ambulatory Visit: Payer: Medicare Other | Admitting: Cardiology

## 2022-11-05 ENCOUNTER — Encounter: Payer: Self-pay | Admitting: Cardiology

## 2022-11-05 VITALS — BP 130/60 | HR 70 | Ht 70.0 in | Wt 167.4 lb

## 2022-11-05 DIAGNOSIS — Z8679 Personal history of other diseases of the circulatory system: Secondary | ICD-10-CM

## 2022-11-05 DIAGNOSIS — G459 Transient cerebral ischemic attack, unspecified: Secondary | ICD-10-CM

## 2022-11-05 DIAGNOSIS — I951 Orthostatic hypotension: Secondary | ICD-10-CM

## 2022-11-05 DIAGNOSIS — I639 Cerebral infarction, unspecified: Secondary | ICD-10-CM

## 2022-11-05 NOTE — Progress Notes (Unsigned)
Primary Physician/Referring:  Aliene Beams, MD  Patient ID: Eric Harrington, male    DOB: Sep 25, 1940, 82 y.o.   MRN: [id number]  Subjective   Chief Complaint  Patient presents with   PAF (paroxysmal atrial fibrillation) (HCC)   Follow-up   Transient Ischemic Attack    HPI: Eric Harrington  is a 82 y.o. Caucasian male with longstanding history of diabetes mellitus with stage 3 CKD, CLL, hyperlipidemia, hypertension, palpitations and near syncopal spells due to severe orthostatic hypotension.  He is now compliant with support hose use.    Admitted to the hospital on 06/04/2022 when he presented with marked generalized weakness, hypotension, dehydration and also new onset A-fib with RVR, was started on amiodarone but converted to sinus rhythm within a few hours.  He was eventually discharged home on amiodarone and anticoagulation.  Readmitted 1 day later on 06/06/2022 due to feeling poorly and positive blood culture with Enterococcus faecalis and he just completed 3 weeks of antibiotic therapy.  TTE was negative for endocarditis. Patient was admitted for observation on 10/25/2022 when he presented with transient aphasia lasting <1-hour.  He was in sinus rhythm.  He was started on Eliquis and discharged home.   Past Medical History:  Diagnosis Date   Chronic Leukemia    Diabetes mellitus without complication (HCC)    Hyperlipemia 12/02/2018   Hyperlipidemia    Hypertension    Type 2 diabetes mellitus with complication, without long-term current use of insulin (HCC) 12/02/2018    Past Surgical History:  Procedure Laterality Date   CARDIAC CATHETERIZATION  2017   Social History   Tobacco Use   Smoking status: Former    Packs/day: 1.00    Years: 30.00    Additional pack years: 0.00    Total pack years: 30.00    Types: Cigarettes    Quit date: 1980    Years since quitting: 44.4   Smokeless tobacco: Never   Tobacco comments:    started at age 54  Substance Use Topics   Alcohol use:  Never    Marital Status: Married   Review of Systems  Cardiovascular:  Negative for chest pain, dyspnea on exertion and leg swelling.  Neurological:  Positive for dizziness and loss of balance.   Objective      11/05/2022    2:40 PM 10/26/2022    2:00 PM 10/26/2022    1:00 PM  Vitals with BMI  Height 5\' 10"     Weight 167 lbs 6 oz    BMI 24.02    Systolic 130 121 409  Diastolic 60 96 93  Pulse 70 86 82   Today's Vitals   11/05/22 1440  BP: 130/60  Pulse: 70  SpO2: 98%  Weight: 167 lb 6.4 oz (75.9 kg)  Height: 5\' 10"  (1.778 m)   Body mass index is 24.02 kg/m.  Orthostatic VS for the past 72 hrs (Last 3 readings):  Orthostatic BP Patient Position BP Location Cuff Size Orthostatic Pulse  11/05/22 1539 130/60 Sitting -- -- --  11/05/22 1443 100/60 Standing Left Arm Normal 70  11/05/22 1440 -- Sitting Left Arm Normal --     Physical Exam Neck:     Vascular: No carotid bruit or JVD.  Cardiovascular:     Rate and Rhythm: Normal rate and regular rhythm.     Pulses: Normal pulses and intact distal pulses.     Heart sounds: Normal heart sounds. No murmur heard.    No gallop.  Pulmonary:  Effort: Pulmonary effort is normal.     Breath sounds: Normal breath sounds.  Abdominal:     General: Bowel sounds are normal.     Palpations: Abdomen is soft.  Musculoskeletal:     Right lower leg: No edema.     Left lower leg: No edema.    Radiology: No results found.  Laboratory examination:   Lab Results  Component Value Date   NA 134 (L) 10/26/2022   K 4.5 10/26/2022   CO2 25 10/26/2022   GLUCOSE 140 (H) 10/26/2022   BUN 11 10/26/2022   CREATININE 1.34 (H) 10/26/2022   CALCIUM 9.1 10/26/2022   GFRNONAA 54 (L) 10/26/2022       Latest Ref Rng & Units 10/26/2022    2:24 AM 10/25/2022    8:33 PM 07/11/2022   12:10 PM  CMP  Glucose 70 - 99 mg/dL 161  096  045   BUN 8 - 23 mg/dL 11  12  22    Creatinine 0.61 - 1.24 mg/dL 4.09  8.11  9.14   Sodium 135 - 145 mmol/L 134   135  136   Potassium 3.5 - 5.1 mmol/L 4.5  5.0  3.9   Chloride 98 - 111 mmol/L 102  98  102   CO2 22 - 32 mmol/L 25  25  26    Calcium 8.9 - 10.3 mg/dL 9.1  9.4  9.4   Total Protein 6.5 - 8.1 g/dL  5.8  6.6   Total Bilirubin 0.3 - 1.2 mg/dL  0.6  0.4   Alkaline Phos 38 - 126 U/L  70  92   AST 15 - 41 U/L  28  13   ALT 0 - 44 U/L  17  14       Latest Ref Rng & Units 10/26/2022    2:24 AM 10/25/2022    8:33 PM 07/11/2022   12:10 PM  CBC  WBC 4.0 - 10.5 K/uL 68.3  72.1  50.5   Hemoglobin 13.0 - 17.0 g/dL 9.8  9.1  78.2   Hematocrit 39.0 - 52.0 % 30.5  29.1  35.6   Platelets 150 - 400 K/uL 111  118  111    HEMOGLOBIN A1C Lab Results  Component Value Date   HGBA1C 6.2 (H) 10/26/2022   MPG 131.24 10/26/2022   TSH Recent Labs    06/05/22 0515  TSH 0.789   External labs:  Cholesterol, total 134.000 m 07/28/2021 HDL 27.000 mg 07/28/2021 LDL 79.000 mg 07/28/2021 Triglycerides 157.000 m 07/28/2021  A1C 7.000 % 06/05/2022  Radiology   MRI 10/26/2022: 1. Left ventricular ejection fraction, by estimation, is 60 to 65%. The left ventricle has normal function. The left ventricle has no regional wall motion abnormalities. There is mild concentric left ventricular hypertrophy. Left ventricular diastolic  parameters are consistent with Grade I diastolic dysfunction (impaired relaxation).  2. Right ventricular systolic function is normal. The right ventricular size is normal.  3. Left atrial size was mildly dilated.  4. The mitral valve is abnormal. Mild mitral valve regurgitation. No evidence of mitral stenosis.  5. The aortic valve is normal in structure. Aortic valve regurgitation is trivial. No aortic stenosis is present.  6. Aortic dilatation noted. There is borderline dilatation of the aortic root, measuring 38 mm.  7. The inferior vena cava is normal in size with greater than 50% respiratory variability, suggesting right atrial pressure of 3 mmHg. Cardiac Studies:   Coronary  Angiography Nov 27, 2015: In [place], [place]: No significant  CAD.   Event Monitor for 30 days Start date  12/02/2018 - 12/31/2018: Baseline sample showed Sinus Rhythm with a heart rate of 82.7 bpm. There were 0 critical, 0 serious, and 3 stable events that occurred.  Automatically Detected Events: 1 Stable: Sinus Rhythm w/Run of V-Tach (5 Beats) at 4 pm. Manually Detected Events: 1 Stable: Sinus Rhythm w/1st Degree AV Block/Artifact  Echocardiogram 10/26/2022:  1. Left ventricular ejection fraction, by estimation, is 60 to 65%. The left ventricle has normal function. The left ventricle has no regional wall motion abnormalities. There is mild concentric left ventricular hypertrophy. Left ventricular diastolic  parameters are consistent with Grade I diastolic dysfunction (impaired relaxation).  2. Right ventricular systolic function is normal. The right ventricular size is normal.  3. Left atrial size was mildly dilated.  4. The mitral valve is abnormal. Mild mitral valve regurgitation. No evidence of mitral stenosis.  5. The aortic valve is normal in structure. Aortic valve regurgitation is trivial. No aortic stenosis is present.  6. Aortic dilatation noted. There is borderline dilatation of the aortic root, measuring 38 mm.  7. The inferior vena cava is normal in size with greater than 50% respiratory variability, suggesting right atrial pressure of 3 mmHg.  EKG:   EKG 11/07/2022: Normal sinus rhythm at rate of 65 bpm, left anterior fascicular block.  IVCD, LVH. No evidence of ischemia.  Compared to 09/27/2022, no significant change  Allergy & Medications   Allergies  Allergen Reactions   Norvasc [Amlodipine Besylate] Rash    Fatigue  Fever  Chills    Augmentin [Amoxicillin-Pot Clavulanate] Other (See Comments)    Stomach upset   Bystolic [Nebivolol Hcl] Other (See Comments)    Weakness    Coq10 [Coenzyme Q10] Other (See Comments)    Unknown reaction   Cozaar [Losartan] Other (See Comments)     Unknown reaction   Farxiga [Dapagliflozin] Other (See Comments)    Excessive urination Dehydration   Glucotrol [Glipizide] Other (See Comments)    Unknown reaction   Januvia [Sitagliptin] Other (See Comments)    Unknown reaction   Keflex [Cephalexin] Other (See Comments)    Unknown reaction   Lipitor [Atorvastatin Calcium]    Lipitor [Atorvastatin] Other (See Comments)    Unknown reaction   Lopid [Gemfibrozil] Other (See Comments)    Unknown reaction   Omnicef [Cefdinir] Other (See Comments)    Unknown reaction   Prilosec [Omeprazole] Other (See Comments)    Unknown reaction   Toprol Xl [Metoprolol] Other (See Comments)    Weakness    Vibra-Tab [Doxycycline] Other (See Comments)    Fever  Chills   Zestril [Lisinopril] Other (See Comments)    Constipation     Zetia [Ezetimibe] Other (See Comments)    Unknown reaction   Zocor [Simvastatin] Other (See Comments)    Unknown reaction   Neurontin [Gabapentin] Other (See Comments)    Unknown reaction    Current Outpatient Medications:    acetaminophen (TYLENOL) 500 MG tablet, Take 1,000 mg by mouth every 8 (eight) hours as needed for moderate pain, fever or headache., Disp: , Rfl:    albuterol (VENTOLIN HFA) 108 (90 Base) MCG/ACT inhaler, Inhale 1 puff into the lungs every 4 (four) hours as needed for wheezing or shortness of breath., Disp: , Rfl:    apixaban (ELIQUIS) 5 MG TABS tablet, Take 1 tablet (5 mg total) by mouth 2 (two) times daily., Disp: 60 tablet, Rfl: 2   ASCORBIC ACID PO, Take 1 tablet by  mouth daily. Vitamin C., Disp: , Rfl:    cetirizine (ZYRTEC) 10 MG tablet, Take 10 mg by mouth daily as needed for allergies., Disp: , Rfl:    Cholecalciferol (VITAMIN D-3 PO), Take 1 capsule by mouth daily., Disp: , Rfl:    docusate sodium (COLACE) 100 MG capsule, Take 100 mg by mouth daily. , Disp: , Rfl:    esomeprazole (NEXIUM 24HR) 20 MG capsule, Take 20 mg by mouth daily at 12 noon., Disp: , Rfl:    FIBER PO, Take 3 tablets  by mouth daily., Disp: , Rfl:    fludrocortisone (FLORINEF) 0.1 MG tablet, Take 1 tablet (0.1 mg total) by mouth every evening. (Patient taking differently: Take 0.1 mg by mouth See admin instructions. 0.1 mg every evening. HOLD for sBP > 130.), Disp: 30 tablet, Rfl: 2   fluticasone (FLONASE) 50 MCG/ACT nasal spray, Place 1 spray into both nostrils daily as needed for allergies., Disp: , Rfl:    glimepiride (AMARYL) 2 MG tablet, Take 2 mg by mouth daily with breakfast., Disp: , Rfl:    metFORMIN (GLUCOPHAGE) 1000 MG tablet, Take 0.5 tablets (500 mg total) by mouth 2 (two) times daily. MAY RESUME ON 10/29/22 (Patient taking differently: Take 500 mg by mouth 2 (two) times daily.), Disp: , Rfl:    midodrine (PROAMATINE) 10 MG tablet, Take 1 tablet (10 mg total) by mouth 3 (three) times daily with meals. While awake. Do not lay down for 3-4 hours after taking (Patient taking differently: Take 10 mg by mouth 3 (three) times daily.), Disp: 270 tablet, Rfl: 3   Multiple Vitamins-Minerals (ZINC PO), Take 1 tablet by mouth daily., Disp: , Rfl:    Polyethyl Glycol-Propyl Glycol (SYSTANE OP), Place 1 drop into both eyes 2 (two) times daily as needed (dryness, irritation.)., Disp: , Rfl:    Pyridoxine HCl (VITAMIN B-6 PO), Take 1 tablet by mouth daily., Disp: , Rfl:    rosuvastatin (CRESTOR) 5 MG tablet, Take 5 mg by mouth every evening., Disp: , Rfl:    Assessment     ICD-10-CM   1. Cryptogenic stroke (HCC)  I63.9     2. Orthostatic hypotension  I95.1     3. H/O atrial fibrillation without current medication  Z86.79 EKG 12-Lead     CHA2DS2-VASc Score is 4.  Yearly risk of stroke: 5% (A, HTN, DM).  Score of 1=0.6; 2=2.2; 3=3.2; 4=4.8; 5=7.2; 6=9.8; 7=>9.8) -(CHF; HTN; vasc disease DM,  Male = 1; Age <65 =0; 65-74 = 1,  >75 =2; stroke/embolism= 2).   Recommendations:   Eric Harrington is a 82 y.o. Caucasian male with longstanding history of diabetes mellitus with stage 3 CKD, CLL, hyperlipidemia,  hypertension, palpitations and near syncopal spells due to severe orthostatic hypotension.  He is now compliant with support hose use.    Admitted to the hospital on 06/04/2022 when he presented with marked generalized weakness, hypotension, dehydration and also new onset A-fib with RVR, was started on amiodarone but converted to sinus rhythm within a few hours.  He was eventually discharged home on amiodarone and anticoagulation.  Readmitted 1 day later on 06/06/2022 due to feeling poorly and positive blood culture with Enterococcus faecalis and he just completed 3 weeks of antibiotic therapy.  TTE was negative for endocarditis.  Patient's family made an appointment to see me as he has had recurrence of dizziness and orthostasis.  1. PAF (paroxysmal atrial fibrillation) [facility]) Patient family was concerned if he has gone back into atrial fibrillation.  Patient himself feels well.  He is maintaining sinus rhythm.  As I had felt in the past that the atrial fibrillation with RVR when he presented in January 2024 was probably related to sepsis, he has not had any recurrence of atrial fibrillation.  I will go ahead and discontinue Eliquis.  No indication for aspirin specially in view of CLL and chronic anemia and thrombocytopenia.  - EKG 12-Lead  2. Orthostatic hypotension Patient is still orthostatic, dropping his blood pressure about 30-35 points.  I will add fludrocortisone 0.1 mg daily.  - fludrocortisone (FLORINEF) 0.1 MG tablet; Take 1 tablet (0.1 mg total) by mouth every evening.  Dispense: 30 tablet; Refill: 2  3. Supine hypertension I suspect his supine hypertension is now resolved.  He is not on any antihypertensive medications.  Suspect critical illness neuropathy is the etiology for his orthostatic hypotension.  I have reassured the patient's family, given them instructions regarding use and changes that can be done between use of fludrocortisone and midodrine.  I would like to see him back  in 3 months for follow-up.     Eric Decamp, MD, [facility] 11/05/2022, 3:49 PM Office: [phone] Fax: [phone] Pager: [phone]

## 2022-11-30 ENCOUNTER — Ambulatory Visit: Payer: Medicare Other | Admitting: Cardiology

## 2022-11-30 ENCOUNTER — Encounter: Payer: Self-pay | Admitting: Cardiology

## 2022-11-30 VITALS — BP 98/51 | HR 80 | Resp 16 | Ht 70.0 in | Wt 166.0 lb

## 2022-11-30 NOTE — Progress Notes (Signed)
Visit cancelled  Chief Complaint  Patient presents with   Loop implant     Brief History: Eric Harrington is a 82 y.o. Caucasian male with longstanding history of diabetes mellitus with stage 3 CKD, CLL, hyperlipidemia, hypertension, palpitations and near syncopal spells due to severe orthostatic hypotension.  He is now compliant with support hose use and also keep himself well-hydrated and also takes Florinef daily and midodrine as needed basis (has reduced drastically since using support stockings and maintains good hydration).  1 episode of atrial fibrillation for 1 hour spontaneously converted to sinus rhythm on 06/04/2022 when he was admitted with Enterococcus faecalis sepsis.    I discontinued Eliquis on 09/27/2022 as he had no recurrence of atrial fibrillation.  However patient presented to the emergency department on 10/25/2022 with transient episode of aphasia, resolved less than 1 hour, MRI was negative for stroke, patient was in sinus rhythm during the ED evaluation.  The question was whether his TIA was related to atrial fibrillation versus atherosclerotic aortic disease, whether he needs long-term anticoagulation versus observation only with no indication for anticoagulation, with patient with history of CLL, chronic anemia, thrombocytopenia, severe orthostatic hypotension and risk of fall, bleeding complications from Eliquis is not to be underestimated.  Hence shared decision made regarding proceeding with loop implantation, for close monitoring for any recurrence of atrial fibrillation.  If there is no A-fib over the next 6 months to year, could consider discontinuing anticoagulation with watchful observation with remote monitoring.  This is to reduce risk of bleed.  Also monitoring his symptoms for recurrence of TIA with remote monitoring of his loop recorder and correlating clinically would also make sense in further evaluation and management of the patient.  Patient is agreeable.  Low risk for  bleeding risk, infection on his chest wall, discussed with the patient and his wife.  They are willing to proceed.  Blood pressure (!) 98/51, pulse 80, resp. rate 16, height 5\' 10"  (1.778 m), weight 166 lb (75.3 kg), SpO2 100%.   Physical Exam Neck:     Vascular: No carotid bruit or JVD.  Cardiovascular:     Rate and Rhythm: Normal rate and regular rhythm.     Pulses: Normal pulses and intact distal pulses.     Heart sounds: Normal heart sounds. No murmur heard.    No gallop.  Pulmonary:     Effort: Pulmonary effort is normal.     Breath sounds: Normal breath sounds.  Abdominal:     General: Bowel sounds are normal.     Palpations: Abdomen is soft.  Musculoskeletal:     Right lower leg: No edema.     Left lower leg: No edema.      Prodedure performed: Insertion of Abbott Assert-IQ 3 Loop recorder. Serial # [id number]  Indication: TIA After obtaining informed consent, explaining the procedure to the patient, under sterile precautions, local anesthesia with 1% lidocaine with epinephrine was injected subcutaneously in the left parasternal region.  20 mL utilized.  A small nick was made in the left paraspinal region at the 3rd intercostal space. Abbott Assert-IQ 3 Loop recorder  was inserted without any complications.  Patient tolerated the procedure well.  The incision was closed with Steri-Strips.  There is no blood loss, no hematoma.  Written instrtuctions regarding wound care given to the patient.  Device setting:  R wave amplitude 0.8 mV.   Programmed:  AF detection to ON. HVR 160/min. Bradycardia 30 BPM Asystole 3 Sec Patient Trigger: ON  ICD-10-CM   1. TIA (transient ischemic attack)  G45.9     2. PAF (paroxysmal atrial fibrillation) (HCC)  I48.0     3. CLL (chronic lymphocytic leukemia) (HCC)  C91.10     4. Orthostatic hypotension  I95.1         Yates Decamp, MD, [facility] 11/30/2022, 2:16 PM Office: [phone] Fax: [phone] Pager: [phone]

## 2022-12-04 ENCOUNTER — Encounter: Payer: Self-pay | Admitting: Cardiology

## 2022-12-04 ENCOUNTER — Ambulatory Visit: Payer: Medicare Other | Admitting: Cardiology

## 2022-12-04 VITALS — BP 88/57 | HR 101 | Resp 16 | Ht 70.0 in | Wt 164.2 lb

## 2022-12-04 DIAGNOSIS — I951 Orthostatic hypotension: Secondary | ICD-10-CM

## 2022-12-04 DIAGNOSIS — Z8679 Personal history of other diseases of the circulatory system: Secondary | ICD-10-CM

## 2022-12-04 DIAGNOSIS — G459 Transient cerebral ischemic attack, unspecified: Secondary | ICD-10-CM

## 2022-12-04 DIAGNOSIS — Z95818 Presence of other cardiac implants and grafts: Secondary | ICD-10-CM | POA: Insufficient documentation

## 2022-12-04 HISTORY — DX: Presence of other cardiac implants and grafts: Z95.818

## 2022-12-04 NOTE — Progress Notes (Signed)
Chief Complaint  Patient presents with   Loop Implant     Brief History: Eric Harrington is a 82 y.o. Caucasian male with longstanding history of diabetes mellitus with stage 3 CKD, CLL, hyperlipidemia, hypertension, palpitations and near syncopal spells due to severe orthostatic hypotension.  He is now compliant with support hose use and also keep himself well-hydrated and also takes Florinef daily and midodrine as needed basis (has reduced drastically since using support stockings and maintains good hydration).  1 episode of atrial fibrillation for 1 hour spontaneously converted to sinus rhythm on 06/04/2022 when he was admitted with Enterococcus faecalis sepsis.    I discontinued Eliquis on 09/27/2022 as he had no recurrence of atrial fibrillation.  However patient presented to the emergency department on 10/25/2022 with transient episode of aphasia, resolved less than 1 hour, MRI was negative for stroke, patient was in sinus rhythm during the ED evaluation.  The question was whether his TIA was related to atrial fibrillation versus atherosclerotic aortic disease, whether he needs long-term anticoagulation versus observation only with no indication for anticoagulation, with patient with history of CLL, chronic anemia, thrombocytopenia, severe orthostatic hypotension and risk of fall, bleeding complications from Eliquis is not to be underestimated.  Hence shared decision made regarding proceeding with loop implantation, for close monitoring for any recurrence of atrial fibrillation.  If there is no A-fib over the next 6 months to year, could consider discontinuing anticoagulation with watchful observation with remote monitoring.  This is to reduce risk of bleed.  Also monitoring his symptoms for recurrence of TIA with remote monitoring of his loop recorder and correlating clinically would also make sense in further evaluation and management of the patient.  Patient is agreeable.  Low risk for bleeding risk,  infection on his chest wall, discussed with the patient and his wife.  They are willing to proceed.  Blood pressure (!) 88/57, pulse (!) 101, resp. rate 16, height 5\' 10"  (1.778 m), weight 164 lb 3.2 oz (74.5 kg), SpO2 93%.   Physical Exam Neck:     Vascular: No carotid bruit or JVD.  Cardiovascular:     Rate and Rhythm: Normal rate and regular rhythm.     Pulses: Normal pulses and intact distal pulses.     Heart sounds: Normal heart sounds. No murmur heard.    No gallop.  Pulmonary:     Effort: Pulmonary effort is normal.     Breath sounds: Normal breath sounds.  Abdominal:     General: Bowel sounds are normal.     Palpations: Abdomen is soft.  Musculoskeletal:     Right lower leg: No edema.     Left lower leg: No edema.      Prodedure performed: Insertion of Abbott Assert-IQ 3 Loop recorder. Serial # [id number]  Indication: TIA After obtaining informed consent, explaining the procedure to the patient, under sterile precautions, local anesthesia with 1% lidocaine with epinephrine was injected subcutaneously in the left parasternal region.  20 mL utilized.  A small nick was made in the left paraspinal region at the 3rd intercostal space. Abbott Assert-IQ 3 Loop recorder  was inserted without any complications.  Patient tolerated the procedure well.  The incision was closed with Steri-Strips.  There is no blood loss, no hematoma.  Written instrtuctions regarding wound care given to the patient.  Device setting:  R wave amplitude 0.8 mV.   Programmed:  AF detection to ON.  AF episode 6 minutes, continuous AF alert 30 minutes, AF  burden alert >6 hours daily.  Relayed during AF alert 140 bpm for 6 hours. HVR 165/min. Bradycardia 30 BPM Asystole 3 Sec Patient Trigger: ON      ICD-10-CM   1. TIA (transient ischemic attack)  G45.9     2. H/O atrial fibrillation without current medication  Z86.79     3. Orthostatic hypotension  I95.1     4. Loop recorder: Abbott Assert-IQ 3  Loop recorder. Serial # [id number] 12/04/2022  Z95.818          Yates Decamp, MD, [facility] 12/04/2022, 3:02 PM Office: [phone] Fax: [phone] Pager: [phone]

## 2022-12-04 NOTE — Patient Instructions (Addendum)
Do not shower or bathe for 3 days.  Shower and then take the outer bandage off, but leave the Steri-Strips in place until they fall off themselves.  He will come back in 1 week for wound check.  If you notice bleeding, swelling or any kind of puslike discharge please immediately contact us.  Start Eliquis on 12/06/2022

## 2022-12-13 ENCOUNTER — Ambulatory Visit: Payer: Self-pay | Admitting: Cardiology

## 2022-12-13 DIAGNOSIS — Z4889 Encounter for other specified surgical aftercare: Secondary | ICD-10-CM

## 2022-12-13 NOTE — Progress Notes (Signed)
Chief Complaint  Patient presents with   Wound Check    Loop Implant   Loop implant site is healed well.  No discharge.  I will see him back in 4 months for follow-up, follow-up of orthostatic hypotension, supine hypertension, TIA and consideration for discontinuation of anticoagulation depending upon his loop transmission.  Remote loop recorder transmission 12/05/2022: Normal sinus rhythm.

## 2023-01-03 ENCOUNTER — Ambulatory Visit: Payer: Medicare Other | Admitting: Cardiology

## 2023-01-04 ENCOUNTER — Other Ambulatory Visit: Payer: Self-pay

## 2023-01-04 DIAGNOSIS — C9110 Chronic lymphocytic leukemia of B-cell type not having achieved remission: Secondary | ICD-10-CM

## 2023-01-06 ENCOUNTER — Encounter: Payer: Self-pay | Admitting: Cardiology

## 2023-01-07 ENCOUNTER — Inpatient Hospital Stay: Payer: Medicare Other | Attending: Hematology | Admitting: Hematology

## 2023-01-07 ENCOUNTER — Inpatient Hospital Stay: Payer: Medicare Other

## 2023-01-07 VITALS — BP 134/70 | HR 79 | Temp 97.4°F | Resp 18 | Wt 166.2 lb

## 2023-01-07 DIAGNOSIS — Z7901 Long term (current) use of anticoagulants: Secondary | ICD-10-CM | POA: Diagnosis not present

## 2023-01-07 DIAGNOSIS — C9110 Chronic lymphocytic leukemia of B-cell type not having achieved remission: Secondary | ICD-10-CM | POA: Insufficient documentation

## 2023-01-07 DIAGNOSIS — R053 Chronic cough: Secondary | ICD-10-CM | POA: Insufficient documentation

## 2023-01-07 DIAGNOSIS — R1030 Lower abdominal pain, unspecified: Secondary | ICD-10-CM | POA: Insufficient documentation

## 2023-01-07 DIAGNOSIS — R911 Solitary pulmonary nodule: Secondary | ICD-10-CM | POA: Insufficient documentation

## 2023-01-07 DIAGNOSIS — E1140 Type 2 diabetes mellitus with diabetic neuropathy, unspecified: Secondary | ICD-10-CM | POA: Diagnosis not present

## 2023-01-07 DIAGNOSIS — Z87891 Personal history of nicotine dependence: Secondary | ICD-10-CM | POA: Insufficient documentation

## 2023-01-07 DIAGNOSIS — R0982 Postnasal drip: Secondary | ICD-10-CM | POA: Diagnosis not present

## 2023-01-07 DIAGNOSIS — Z801 Family history of malignant neoplasm of trachea, bronchus and lung: Secondary | ICD-10-CM | POA: Insufficient documentation

## 2023-01-07 DIAGNOSIS — R519 Headache, unspecified: Secondary | ICD-10-CM | POA: Diagnosis not present

## 2023-01-07 DIAGNOSIS — R61 Generalized hyperhidrosis: Secondary | ICD-10-CM | POA: Insufficient documentation

## 2023-01-07 LAB — CBC WITH DIFFERENTIAL (CANCER CENTER ONLY)
Abs Immature Granulocytes: 0.02 10*3/uL (ref 0.00–0.07)
Basophils Absolute: 0 10*3/uL (ref 0.0–0.1)
Basophils Relative: 0 %
Eosinophils Absolute: 0 10*3/uL (ref 0.0–0.5)
Eosinophils Relative: 0 %
HCT: 32.5 % — ABNORMAL LOW (ref 39.0–52.0)
Hemoglobin: 10.6 g/dL — ABNORMAL LOW (ref 13.0–17.0)
Immature Granulocytes: 0 %
Lymphocytes Relative: 94 %
Lymphs Abs: 60.6 10*3/uL — ABNORMAL HIGH (ref 0.7–4.0)
MCH: 32.1 pg (ref 26.0–34.0)
MCHC: 32.6 g/dL (ref 30.0–36.0)
MCV: 98.5 fL (ref 80.0–100.0)
Monocytes Absolute: 2.5 10*3/uL — ABNORMAL HIGH (ref 0.1–1.0)
Monocytes Relative: 4 %
Neutro Abs: 1.1 10*3/uL — ABNORMAL LOW (ref 1.7–7.7)
Neutrophils Relative %: 2 %
Platelet Count: 99 10*3/uL — ABNORMAL LOW (ref 150–400)
RBC: 3.3 MIL/uL — ABNORMAL LOW (ref 4.22–5.81)
RDW: 13.4 % (ref 11.5–15.5)
Smear Review: DECREASED
WBC Count: 64.3 10*3/uL (ref 4.0–10.5)
nRBC: 0 % (ref 0.0–0.2)

## 2023-01-07 LAB — CMP (CANCER CENTER ONLY)
ALT: 10 U/L (ref 0–44)
AST: 12 U/L — ABNORMAL LOW (ref 15–41)
Albumin: 4.4 g/dL (ref 3.5–5.0)
Alkaline Phosphatase: 91 U/L (ref 38–126)
Anion gap: 8 (ref 5–15)
BUN: 16 mg/dL (ref 8–23)
CO2: 27 mmol/L (ref 22–32)
Calcium: 9.8 mg/dL (ref 8.9–10.3)
Chloride: 99 mmol/L (ref 98–111)
Creatinine: 1.33 mg/dL — ABNORMAL HIGH (ref 0.61–1.24)
GFR, Estimated: 54 mL/min — ABNORMAL LOW (ref >60)
Glucose, Bld: 155 mg/dL — ABNORMAL HIGH (ref 70–99)
Potassium: 4.3 mmol/L (ref 3.5–5.1)
Sodium: 134 mmol/L — ABNORMAL LOW (ref 135–145)
Total Bilirubin: 0.4 mg/dL (ref 0.3–1.2)
Total Protein: 6.6 g/dL (ref 6.5–8.1)

## 2023-01-07 LAB — LACTATE DEHYDROGENASE: LDH: 124 U/L (ref 98–192)

## 2023-01-07 NOTE — Progress Notes (Signed)
HEMATOLOGY ONCOLOGY CLINIC NOTE  Date of Service:  01/07/23    Patient Care Team: Aliene Beams, MD as PCP - General (Family Medicine) Johney Maine, MD as Consulting Physician (Hematology)  CHIEF COMPLAINTS/PURPOSE OF CONSULTATION:  Continued evaluation and management of CLL  HISTORY OF PRESENTING ILLNESS:   Eric Harrington 82 y.o. male is here because of a referral from Dr. Donnetta Simpers from [facility] regarding a trend in his elevated WBC.   He is accompanied today by his wife of 54 years. The pt reports that he is doing well overall. He recently had a biopsy to check his prostate at [facility] with Dr. Devoria Glassing and they took 12 core samples with reportedly no significant findings.  Of note prior to the patient's visit today, pt has had CBC completed on 04/26/17 with results revealing WBC at 14.7, Hgb at 12.6, Lymph Abs at 11.0 with all other values WNL. On 03/26/17 his WBC were 12.8, Hgb at 13.3 and Lymph's at  9.90. On 02/06/17 his WBC were 12.7, Hgb at 13.4, and Lymph's at 8.80.   On review of systems, pt reports post nasal drip, persistent cough, occasional troublesome stomach, pain in his lower abdomen that feels like his muscles are irritated, reports he has lost 25 lbs in the last 2.5 years (he associates a decreased appetite after taking beta blockers during this period) and denies no recent colds or infections, acute changes in energy levels, and leg swelling.   On PMHx the pt reports taking Zetia. He has IB'S and has had 3 colonoscopies with no significant findings or inflammatory processes. He reports that over the last 2.5 years he had heart catheterizations that all turned out well. He reports having diabetes. He also reports neuropathy along his whole right side that lasted for a few months that ceased abruptly.   Interval History:   Eric Harrington is a wonderful gentleman who is here for continued evaluation and management of CLL.  Patient was last  seen by me on 07/11/2022 and he complained of increased sweating, but was doing well overall.   Patient is accompanied by his wife during this visit. He notes he has been doing fairly well since our last visit. Patient was hospitalized in June with symptoms of headaches, slurred speech, and confusion. Patient was diagnosed with transient aphasia. Patient notes he resumed Eliquis back in June. His cardiologist discontinued Eliquis in January. Patient is unsure if he was taking aspirin after discontinuing Eliquis.   He complains of recurrent headaches since getting discharged in June. Patient had headache and Photopsias last Saturday. He has not been to a Neurologist regarding headaches, photopsia, and TIA since hospitalization in June.   Patient reports he had shingles near his chest area in April 2024.  He denies any recent infection issues, fever, chills, abdominal pain, back pain, testicular pain, testicular swelling, abnormal bowel movement, or leg swelling. He complains of weakness, and being cold. He also complains of burning sensation with bowel movement due to hemorrhoids.   Patient reports couple episodes of night sweats since our last visit.   Patient notes he is still not back at his baseline during this visit.     MEDICAL HISTORY:  1. HTN 2. DM2 3. H/o PAC's 4. transient a fib with cardiac cath 5. HLD 6. CKD stage 3 7. Irritable bowel syndrome 8. GERD 9. H/o presyndope  SURGICAL HISTORY:  1. Prostate bx 2. Cardiac cath  SOCIAL HISTORY: Social History  Socioeconomic History   Marital status: Married    Spouse name: Not on file   Number of children: 2   Years of education: Not on file   Highest education level: Not on file  Occupational History   Not on file  Tobacco Use   Smoking status: Former    Current packs/day: 0.00    Average packs/day: 1 pack/day for 30.0 years (30.0 ttl pk-yrs)    Types: Cigarettes    Start date: 40    Quit date: 64    Years  since quitting: 44.6   Smokeless tobacco: Never   Tobacco comments:    started at age 20  Vaping Use   Vaping status: Never Used  Substance and Sexual Activity   Alcohol use: Never   Drug use: Not Currently   Sexual activity: Not Currently    Birth control/protection: None  Other Topics Concern   Not on file  Social History Narrative   Not on file   Social Determinants of Health   Financial Resource Strain: Not on file  Food Insecurity: No Food Insecurity (06/05/2022)   Hunger Vital Sign    Worried About Running Out of Food in the Last Year: Never true    Ran Out of Food in the Last Year: Never true  Transportation Needs: No Transportation Needs (06/05/2022)   PRAPARE - Administrator, Civil Service (Medical): No    Lack of Transportation (Non-Medical): No  Physical Activity: Not on file  Stress: Not on file  Social Connections: Unknown (10/03/2021)   Received from [facility], [facility]   Social Network    Social Network: Not on file  Intimate Partner Violence: Not At Risk (06/05/2022)   Humiliation, Afraid, Rape, and Kick questionnaire    Fear of Current or Ex-Partner: No    Emotionally Abused: No    Physically Abused: No    Sexually Abused: No    FAMILY HISTORY: Family History  Problem Relation Age of Onset   Lung cancer Mother    Heart attack Father    Dementia Sister    Diabetes Brother    Diabetes Sister    Heart attack Brother     ALLERGIES:  is allergic to norvasc [amlodipine besylate], augmentin [amoxicillin-pot clavulanate], bystolic [nebivolol hcl], coq10 [coenzyme q10], cozaar [losartan], farxiga [dapagliflozin], glucotrol [glipizide], januvia [sitagliptin], keflex [cephalexin], lipitor [atorvastatin calcium], lipitor [atorvastatin], lopid [gemfibrozil], omnicef [cefdinir], prilosec [omeprazole], toprol xl [metoprolol], vibra-tab [doxycycline], zestril [lisinopril], zetia [ezetimibe], zocor [simvastatin], and neurontin  [gabapentin].  MEDICATIONS:  Current Outpatient Medications  Medication Sig Dispense Refill   acetaminophen (TYLENOL) 500 MG tablet Take 1,000 mg by mouth every 8 (eight) hours as needed for moderate pain, fever or headache.     albuterol (VENTOLIN HFA) 108 (90 Base) MCG/ACT inhaler Inhale 1 puff into the lungs every 4 (four) hours as needed for wheezing or shortness of breath.     apixaban (ELIQUIS) 5 MG TABS tablet Take 1 tablet (5 mg total) by mouth 2 (two) times daily. (Patient taking differently: Take 5 mg by mouth 2 (two) times daily. HOLD for loop implant procedure on 12/04/22) 60 tablet 2   ASCORBIC ACID PO Take 1 tablet by mouth daily. Vitamin C.     cetirizine (ZYRTEC) 10 MG tablet Take 10 mg by mouth daily as needed for allergies.     Cholecalciferol (VITAMIN D-3 PO) Take 1 capsule by mouth daily.     docusate sodium (COLACE) 100 MG capsule Take 100 mg by  mouth daily.      esomeprazole (NEXIUM 24HR) 20 MG capsule Take 20 mg by mouth daily at 12 noon.     FIBER PO Take 3 tablets by mouth daily.     fludrocortisone (FLORINEF) 0.1 MG tablet Take 1 tablet (0.1 mg total) by mouth every evening. (Patient taking differently: Take 0.1 mg by mouth See admin instructions. 0.1 mg every evening. HOLD for sBP > 130.) 30 tablet 2   fluticasone (FLONASE) 50 MCG/ACT nasal spray Place 1 spray into both nostrils daily as needed for allergies.     glimepiride (AMARYL) 2 MG tablet Take 2 mg by mouth daily with breakfast.     metFORMIN (GLUCOPHAGE) 1000 MG tablet Take 0.5 tablets (500 mg total) by mouth 2 (two) times daily. MAY RESUME ON 10/29/22 (Patient taking differently: Take 500 mg by mouth 2 (two) times daily.)     midodrine (PROAMATINE) 10 MG tablet Take 1 tablet (10 mg total) by mouth 3 (three) times daily with meals. While awake. Do not lay down for 3-4 hours after taking (Patient taking differently: Take 10 mg by mouth 3 (three) times daily.) 270 tablet 3   Multiple Vitamins-Minerals (ZINC PO) Take  1 tablet by mouth daily.     Polyethyl Glycol-Propyl Glycol (SYSTANE OP) Place 1 drop into both eyes 2 (two) times daily as needed (dryness, irritation.).     Pyridoxine HCl (VITAMIN B-6 PO) Take 1 tablet by mouth daily.     rosuvastatin (CRESTOR) 5 MG tablet Take 5 mg by mouth every evening.     No current facility-administered medications for this visit.    REVIEW OF SYSTEMS:   10 Point review of Systems was done is negative except as noted above.   PHYSICAL EXAMINATION:  Vitals:   01/07/23 1209 01/07/23 1210  BP: (!) 109/51 134/70  Pulse: 79   Resp: 18   Temp: (!) 97.4 F (36.3 C)   SpO2: 98%    Filed Weights   01/07/23 1209  Weight: 166 lb 3.2 oz (75.4 kg)   NAD GENERAL:alert, in no acute distress and comfortable SKIN: no acute rashes, no significant lesions EYES: conjunctiva are pink and non-injected, sclera anicteric OROPHARYNX: MMM, no exudates, no oropharyngeal erythema or ulceration NECK: supple, no JVD LYMPH:  no palpable lymphadenopathy in the cervical, axillary or inguinal regions LUNGS: clear to auscultation b/l with normal respiratory effort HEART: regular rate & rhythm ABDOMEN:  normoactive bowel sounds , non tender, not distended.  No palpable splenomegaly noted. Extremity: no pedal edema PSYCH: alert & oriented x 3 with fluent speech NEURO: no focal motor/sensory deficits LABORATORY DATA:   I have reviewed the data as listed  .    Latest Ref Rng & Units 01/07/2023   11:57 AM 10/26/2022    2:24 AM 10/25/2022    8:33 PM  CBC  WBC 4.0 - 10.5 K/uL 64.3  68.3  72.1   Hemoglobin 13.0 - 17.0 g/dL 16.1  9.8  9.1   Hematocrit 39.0 - 52.0 % 32.5  30.5  29.1   Platelets 150 - 400 K/uL 99  111  118     . CBC    Component Value Date/Time   WBC 64.3 (HH) 01/07/2023 1157   WBC 68.3 (HH) 10/26/2022 0224   RBC 3.30 (L) 01/07/2023 1157   HGB 10.6 (L) 01/07/2023 1157   HCT 32.5 (L) 01/07/2023 1157   PLT 99 (L) 01/07/2023 1157   MCV 98.5 01/07/2023 1157    MCH 32.1 01/07/2023  1157   MCHC 32.6 01/07/2023 1157   RDW 13.4 01/07/2023 1157   LYMPHSABS 60.6 (H) 01/07/2023 1157   MONOABS 2.5 (H) 01/07/2023 1157   EOSABS 0.0 01/07/2023 1157   BASOSABS 0.0 01/07/2023 1157   .Marland Kitchen Lab Results  Component Value Date   RETICCTPCT 1.4 04/24/2018   RBC 2.93 (L) 10/26/2022       Latest Ref Rng & Units 01/07/2023   11:57 AM 10/26/2022    2:24 AM 10/25/2022    8:33 PM  CMP  Glucose 70 - 99 mg/dL 956  213  086   BUN 8 - 23 mg/dL 16  11  12    Creatinine 0.61 - 1.24 mg/dL 5.78  4.69  6.29   Sodium 135 - 145 mmol/L 134  134  135   Potassium 3.5 - 5.1 mmol/L 4.3  4.5  5.0   Chloride 98 - 111 mmol/L 99  102  98   CO2 22 - 32 mmol/L 27  25  25    Calcium 8.9 - 10.3 mg/dL 9.8  9.1  9.4   Total Protein 6.5 - 8.1 g/dL 6.6   5.8   Total Bilirubin 0.3 - 1.2 mg/dL 0.4   0.6   Alkaline Phos 38 - 126 U/L 91   70   AST 15 - 41 U/L 12   28   ALT 0 - 44 U/L 10   17    . Lab Results  Component Value Date   LDH 124 01/07/2023   Component     Latest Ref Rng & Units 06/13/2017  HCV Ab     0.0 - 0.9 s/co ratio <0.1  Hepatitis B Surface Ag     Negative Negative  Hep B Core Ab, Tot     Negative Negative         RADIOGRAPHIC STUDIES: I have personally reviewed the radiological images as listed and agreed with the findings in the report. No results found.  ASSESSMENT & PLAN:   82 y.o. is a male with  1. Chronic lymphocytic leukemia. Likely Rai Stage 0, CLL FISH panel with 13 q. deletion  -he presented with Lymphocytosis incidentally noted on routine labs No associated significant anemia or thrombocytopenia. No constitutional symptoms No overt clinically palpable LNadenopathy or hepato-splenomegaly. 13q mutation present   08/28/17 CT C/A/P revealed Normal sized spleen, no enlarged lymph nodes. There is evidence of very small lung nodule, likely related to inflammatory process. Will monitor with CT chest in 12 months   PLAN: -Discussed lab results from  today, 01/07/2023, with the patient. CBC shows elevated WBC of 64.3 K, decreased hemoglobin of 10.6 g/dL, decreased hematocrit at 32.5%, and decreased platelets of 99 K. CMP is pending. -recommend to continue to follow-up with Cardiologist.  -recommend to call the Neurologist office regarding referral for headaches and TIA.   -Recommended to stay hydrated.   -Patient has no clear lab or clinical evidence of CLL progression needing treatment for his CLL at this time. -Continue age-appropriate vaccinations and follow-up.  FOLLOW-UP: RTC with Dr Candise Che with labs in 5 months  The total time spent in the appointment was 23 minutes* .  All of the patient's questions were answered with apparent satisfaction. The patient knows to call the clinic with any problems, questions or concerns.   Wyvonnia Lora MD MS AAHIVMS [facility] Hematology/Oncology Physician [facility]  .*Total Encounter Time as defined by the Centers for Medicare and Medicaid Services includes, in addition to the face-to-face time  of a patient visit (documented in the note above) non-face-to-face time: obtaining and reviewing outside history, ordering and reviewing medications, tests or procedures, care coordination (communications with other health care professionals or caregivers) and documentation in the medical record.   I,Param Shah,acting as a Neurosurgeon for Wyvonnia Lora, MD.,have documented all relevant documentation on the behalf of Wyvonnia Lora, MD,as directed by  Wyvonnia Lora, MD while in the presence of Wyvonnia Lora, MD.   .I have reviewed the above documentation for accuracy and completeness, and I agree with the above. Johney Maine MD

## 2023-01-14 ENCOUNTER — Telehealth: Payer: Self-pay | Admitting: Psychiatry

## 2023-01-14 ENCOUNTER — Encounter: Payer: Self-pay | Admitting: Psychiatry

## 2023-01-14 ENCOUNTER — Ambulatory Visit (INDEPENDENT_AMBULATORY_CARE_PROVIDER_SITE_OTHER): Payer: Medicare Other | Admitting: Psychiatry

## 2023-01-14 VITALS — BP 122/69 | HR 71 | Ht 70.0 in | Wt 165.8 lb

## 2023-01-14 DIAGNOSIS — R443 Hallucinations, unspecified: Secondary | ICD-10-CM

## 2023-01-14 NOTE — Telephone Encounter (Signed)
[employer] medicare NPR sent to [employer] [phone]
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
## 2023-01-17 ENCOUNTER — Ambulatory Visit (INDEPENDENT_AMBULATORY_CARE_PROVIDER_SITE_OTHER): Payer: Medicare Other | Admitting: Neurology

## 2023-01-17 ENCOUNTER — Ambulatory Visit (HOSPITAL_COMMUNITY)
Admission: RE | Admit: 2023-01-17 | Discharge: 2023-01-17 | Disposition: A | Discharge status: Home / Self Care | Payer: Medicare Other | Source: Ambulatory Visit | Attending: Psychiatry | Admitting: Psychiatry

## 2023-01-17 DIAGNOSIS — R443 Hallucinations, unspecified: Secondary | ICD-10-CM

## 2023-01-17 DIAGNOSIS — R4182 Altered mental status, unspecified: Secondary | ICD-10-CM

## 2023-01-17 MED ORDER — GADOBUTROL 1 MMOL/ML IV SOLN
8.0000 mL | Freq: Once | INTRAVENOUS | Status: AC | PRN
  Administered 2023-01-17: 8 mL via INTRAVENOUS

## 2023-01-17 NOTE — Procedures (Signed)
    History:  81 year old man with hallucinations, EEG to evaluate for seizure  EEG classification: Awake and drowsy  Duration: 24 minutes  Technical aspects: This EEG study was done with scalp electrodes positioned according to the 10-20 International system of electrode placement. Electrical activity was reviewed with band pass filter of 1-70Hz , sensitivity of 7 uV/mm, display speed of 9mm/sec with a 60Hz  notched filter applied as appropriate. EEG data were recorded continuously and digitally stored.   Description of the recording: The background rhythms of this recording consists of a fairly well modulated medium amplitude theta activity. Photic stimulation was performed, did not show any abnormalities. Hyperventilation was not performed. Drowsiness was manifested by background fragmentation. No abnormal epileptiform discharges seen during this recording. There was no focal slowing. There were no electrographic seizure identified.   Abnormality: Mild diffuse slowing   Impression: This is an abnormal EEG recorded while drowsy and awake due to mild diffuse slowing. This is consistent with a generalized brain dysfunction, nonspecific.     Windell Norfolk, MD [facility]

## 2023-01-22 ENCOUNTER — Other Ambulatory Visit: Payer: Self-pay | Admitting: Psychiatry

## 2023-01-22 DIAGNOSIS — M4802 Spinal stenosis, cervical region: Secondary | ICD-10-CM

## 2023-01-24 ENCOUNTER — Telehealth: Payer: Self-pay | Admitting: Psychiatry

## 2023-01-24 NOTE — Telephone Encounter (Signed)
Ortho referral faxed to [facility] fax# [phone], phone# [phone]

## 2023-02-04 ENCOUNTER — Encounter: Payer: Self-pay | Admitting: Cardiology

## 2023-03-18 ENCOUNTER — Telehealth: Payer: Self-pay | Admitting: Cardiology

## 2023-03-18 MED ORDER — APIXABAN 5 MG PO TABS: 5.0000 mg | ORAL_TABLET | Freq: Two times a day (BID) | ORAL | 5 refills | Status: DC

## 2023-03-18 NOTE — Telephone Encounter (Signed)
Prescription refill request for Eliquis received. Indication:afib Last office visit:7/24 Scr:1.33  8/24 Age: 82 Weight:75.2  kg  Prescription refilled

## 2023-03-18 NOTE — Telephone Encounter (Signed)
*  STAT* If patient is at the pharmacy, call can be transferred to refill team.   1. Which medications need to be refilled? (please list name of each medication and dose if known) apixaban (ELIQUIS) 5 MG TABS tablet    2. Would you like to learn more about the convenience, safety, & potential cost savings by using the [facility]? N/A   3. Are you open to using the [facility] (Type [facility]. N/A   4. Which pharmacy/location (including street and city if local pharmacy) is medication to be sent to?  [facility] - [place], [place] - [address]     5. Do they need a 30 day or 90 day supply? 30 day supply

## 2023-04-04 ENCOUNTER — Telehealth: Payer: Self-pay

## 2023-04-04 NOTE — Telephone Encounter (Signed)
   Pre-operative Risk Assessment    Patient Name: Eric Harrington  DOB: [birth year] MRN: [id number]    Last office visit : 01/03/2023 Upcoming appointment : 04/15/23 w/ Dr. Jacinto Halim     Request for Surgical Clearance    Procedure:   C4-5, C5-6 Anterior Cervical Fusion  Date of Surgery:  Clearance TBD                                 Surgeon:  Temple Pacini Surgeon's Group or Practice Name:  [facility]  Phone number:  [phone] Fax number:  [phone]   Type of Clearance Requested:   - Medical  - Pharmacy:  Hold Apixaban (Eliquis) Not indicated   Type of Anesthesia:  General    Additional requests/questions:    [name]   04/04/2023, 3:38 PM

## 2023-04-05 NOTE — Telephone Encounter (Signed)
Patient with diagnosis of afib on Eliquis for anticoagulation.    Procedure: C4-5, C5-6 Anterior Cervical Fusion  Date of procedure: TBD   CHA2DS2-VASc Score = 7   This indicates a 11.2% annual risk of stroke. The patient's score is based upon: CHF History: 0 HTN History: 1 Diabetes History: 1 Stroke History: 2 (TIA) Vascular Disease History: 1 Age Score: 2 Gender Score: 0     Eliquis was d/c on 09/27/2022 as he had no recurrence of atrial fibrillation. However patient presented to the emergency department on 10/25/2022 with transient episode of aphasia, resolved less than 1 hour, MRI was negative for stroke, patient was in sinus rhythm during the ED evaluation. The question was whether his TIA was related to atrial fibrillation versus atherosclerotic aortic disease. He was resumed on Eliquis and loop was placed in July. Discussion in 4 months as to if anticoagulation would be d/c. He has apt with Dr. Jacinto Halim 04/15/23. Will hold off on recommendation until that apt.  CrCl 47 ml/min Platelet count 99

## 2023-04-05 NOTE — Telephone Encounter (Signed)
   Name: Eric Harrington  DOB: 09/19/1940  MRN: [id number]  Primary Cardiologist: None  Chart reviewed as part of pre-operative protocol coverage. The patient has an upcoming visit scheduled with Dr. Jacinto Halim on 04/15/2023 at which time clearance can be addressed in case there are any issues that would impact surgical recommendations.  C4-5, C5-6 Anterior Cervical Fusion is not scheduled until TBD as below. I added preop FYI to appointment note so that provider is aware to address at time of outpatient visit.  Per office protocol the cardiology provider should forward their finalized clearance decision and recommendations regarding antiplatelet therapy to the requesting party below.    This message will also be routed to pharmacy pool or input on holding Eliquis as requested below so that this information is available to the clearing provider at time of patient's appointment.   I will route this message as FYI to requesting party and remove this message from the preop box as separate preop APP input not needed at this time.   Please call with any questions.  Joylene Grapes, NP  04/05/2023, 11:35 AM

## 2023-04-08 ENCOUNTER — Ambulatory Visit (INDEPENDENT_AMBULATORY_CARE_PROVIDER_SITE_OTHER): Payer: Medicare Other

## 2023-04-08 DIAGNOSIS — Z8679 Personal history of other diseases of the circulatory system: Secondary | ICD-10-CM | POA: Diagnosis not present

## 2023-04-08 LAB — CUP PACEART REMOTE DEVICE CHECK
Date Time Interrogation Session: 20241118021651
Implantable Pulse Generator Implant Date: 20240716
Pulse Gen Serial Number: 511029620

## 2023-04-15 ENCOUNTER — Ambulatory Visit: Payer: Medicare Other | Attending: Cardiology | Admitting: Cardiology

## 2023-04-15 ENCOUNTER — Encounter: Payer: Self-pay | Admitting: Cardiology

## 2023-04-15 ENCOUNTER — Ambulatory Visit: Payer: Medicare Other | Admitting: Cardiology

## 2023-04-15 VITALS — BP 100/58 | HR 87 | Resp 16 | Ht 70.0 in | Wt 168.0 lb

## 2023-04-15 DIAGNOSIS — I951 Orthostatic hypotension: Secondary | ICD-10-CM | POA: Diagnosis not present

## 2023-04-15 DIAGNOSIS — Z95818 Presence of other cardiac implants and grafts: Secondary | ICD-10-CM

## 2023-04-15 DIAGNOSIS — Z8679 Personal history of other diseases of the circulatory system: Secondary | ICD-10-CM

## 2023-04-15 DIAGNOSIS — G459 Transient cerebral ischemic attack, unspecified: Secondary | ICD-10-CM

## 2023-04-15 MED ORDER — ASPIRIN 81 MG PO CHEW
81.0000 mg | CHEWABLE_TABLET | Freq: Every day | ORAL | Status: DC
Start: 2023-04-15 — End: 2023-05-24

## 2023-04-15 NOTE — Progress Notes (Signed)
Cardiology Office Note:  .   Date:  04/15/2023  ID:  Eric Harrington, DOB 1941/02/13, MRN [id number] PCP: Aliene Beams, MD  [facility] Providers Cardiologist:  Yates Decamp, MD   History of Present Illness: .   Eric Harrington is a 82 y.o. Caucasian male with longstanding history of diabetes mellitus with stage 3 CKD, CLL, hyperlipidemia, hypertension, palpitations and near syncopal spells due to severe orthostatic hypotension. He is now compliant with support hose use and also keep himself well-hydrated and also takes Florinef daily and midodrine as needed basis (has reduced drastically since using support stockings and maintains good hydration). 1 episode of atrial fibrillation for 1 hour spontaneously converted to sinus rhythm on 06/04/2022 when he was admitted with Enterococcus faecalis sepsis.   I discontinued Eliquis on 09/27/2022 as he had no recurrence of atrial fibrillation. However patient presented to the emergency department on 10/25/2022 with transient episode of aphasia, resolved less than 1 hour, MRI was negative for stroke, patient was in sinus rhythm during the ED evaluation.   The question was whether his TIA was related to atrial fibrillation versus atherosclerotic aortic disease, whether he needs long-term anticoagulation versus observation only with no indication for anticoagulation, with patient with history of CLL, chronic anemia, thrombocytopenia, severe orthostatic hypotension and risk of fall, bleeding complications from Eliquis is not to be underestimated. Hence shared decision made regarding proceeding with loop implantation, for close monitoring for any recurrence of atrial fibrillation.   Patient is also scheduled for cervical spine 4 through 6 anterior cervical fusion and is awaiting cardiac clearance and management of anticoagulation.  He is accompanied by his wife.  Discussed the use of AI scribe software for clinical note transcription with the patient, who gave  verbal consent to proceed.  History of Present Illness   The patient, with a history of neck and back issues, presents with a progressive worsening of these symptoms over the years. The patient's surgeon has recommended surgery, and the patient is currently on Eliquis due to a history of AFib.   The patient is also on midodrine for blood pressure management, which he takes two to three times a day depending on his blood pressure readings.  The patient also has a history of CLL, which has resulted in the presence of multiple "kernels" or nodules in the neck area.  T he patient also developed a fever blister following the flu shot, which is being treated with Abreva.      Review of Systems  Cardiovascular:  Negative for chest pain, dyspnea on exertion, leg swelling, near-syncope and syncope.  Neurological:  Positive for dizziness.    Labs   Lab Results  Component Value Date   CHOL 130 10/26/2022   HDL 22 (L) 10/26/2022   LDLCALC 56 10/26/2022   TRIG 258 (H) 10/26/2022   CHOLHDL 5.9 10/26/2022   Lab Results  Component Value Date   NA 134 (L) 01/07/2023   K 4.3 01/07/2023   CO2 27 01/07/2023   GLUCOSE 155 (H) 01/07/2023   BUN 16 01/07/2023   CREATININE 1.33 (H) 01/07/2023   CALCIUM 9.8 01/07/2023   GFRNONAA 54 (L) 01/07/2023      Latest Ref Rng & Units 01/07/2023   11:57 AM 10/26/2022    2:24 AM 10/25/2022    8:33 PM  BMP  Glucose 70 - 99 mg/dL 914  782  956   BUN 8 - 23 mg/dL 16  11  12    Creatinine 0.61 -  1.24 mg/dL 6.57  8.46  9.62   Sodium 135 - 145 mmol/L 134  134  135   Potassium 3.5 - 5.1 mmol/L 4.3  4.5  5.0   Chloride 98 - 111 mmol/L 99  102  98   CO2 22 - 32 mmol/L 27  25  25    Calcium 8.9 - 10.3 mg/dL 9.8  9.1  9.4        Latest Ref Rng & Units 01/07/2023   11:57 AM 10/26/2022    2:24 AM 10/25/2022    8:33 PM  CBC  WBC 4.0 - 10.5 K/uL 64.3  68.3  72.1   Hemoglobin 13.0 - 17.0 g/dL 95.2  9.8  9.1   Hematocrit 39.0 - 52.0 % 32.5  30.5  29.1   Platelets 150 -  400 K/uL 99  111  118     Physical Exam:   VS:  BP (!) 100/58 (BP Location: Right Arm, Patient Position: Standing, Cuff Size: Normal)   Pulse 87   Resp 16   Ht 5\' 10"  (1.778 m)   Wt 168 lb (76.2 kg)   SpO2 96%   BMI 24.11 kg/m    Wt Readings from Last 3 Encounters:  04/15/23 168 lb (76.2 kg)  01/14/23 165 lb 12.8 oz (75.2 kg)  01/07/23 166 lb 3.2 oz (75.4 kg)     Physical Exam Neck:     Vascular: No carotid bruit or JVD.  Cardiovascular:     Rate and Rhythm: Normal rate and regular rhythm.     Pulses: Intact distal pulses.     Heart sounds: Normal heart sounds. No murmur heard.    No gallop.  Pulmonary:     Effort: Pulmonary effort is normal.     Breath sounds: Normal breath sounds.  Abdominal:     General: Bowel sounds are normal.     Palpations: Abdomen is soft.  Musculoskeletal:     Right lower leg: No edema.     Left lower leg: No edema.     Studies Reviewed: .    Remote loop recorder transmission 04/08/2023: Predominant rhythm is normal sinus rhythm.  There was no atrial fibrillation, no pauses, no symptoms reported.  EKG:   EKG 11/07/2022: Normal sinus rhythm at rate of 65 bpm, left anterior fascicular block.  IVCD, LVH. No evidence of ischemia.  Compared to 09/27/2022, no significant change  Medications and allergies    Allergies  Allergen Reactions   Norvasc [Amlodipine Besylate] Rash    Fatigue  Fever  Chills    Augmentin [Amoxicillin-Pot Clavulanate] Other (See Comments)    Stomach upset   Bystolic [Nebivolol Hcl] Other (See Comments)    Weakness    Coq10 [Coenzyme Q10] Other (See Comments)    Unknown reaction   Cozaar [Losartan] Other (See Comments)    Unknown reaction   Farxiga [Dapagliflozin] Other (See Comments)    Excessive urination Dehydration   Glucotrol [Glipizide] Other (See Comments)    Unknown reaction   Januvia [Sitagliptin] Other (See Comments)    Unknown reaction   Keflex [Cephalexin] Other (See Comments)    Unknown reaction    Lipitor [Atorvastatin Calcium]    Lipitor [Atorvastatin] Other (See Comments)    Unknown reaction   Lopid [Gemfibrozil] Other (See Comments)    Unknown reaction   Omnicef [Cefdinir] Other (See Comments)    Unknown reaction   Prilosec [Omeprazole] Other (See Comments)    Unknown reaction   Toprol Xl [Metoprolol] Other (See Comments)  Weakness    Vibra-Tab [Doxycycline] Other (See Comments)    Fever  Chills   Zestril [Lisinopril] Other (See Comments)    Constipation     Zetia [Ezetimibe] Other (See Comments)    Unknown reaction   Zocor [Simvastatin] Other (See Comments)    Unknown reaction   Neurontin [Gabapentin] Other (See Comments)    Unknown reaction    Current Meds  Medication Sig   acetaminophen (TYLENOL) 500 MG tablet Take 1,000 mg by mouth every 8 (eight) hours as needed for moderate pain, fever or headache.   albuterol (VENTOLIN HFA) 108 (90 Base) MCG/ACT inhaler Inhale 1 puff into the lungs every 4 (four) hours as needed for wheezing or shortness of breath.   ASCORBIC ACID PO Take 1 tablet by mouth daily. Vitamin C.   aspirin (ASPIRIN CHILDRENS) 81 MG chewable tablet Chew 1 tablet (81 mg total) by mouth daily.   cetirizine (ZYRTEC) 10 MG tablet Take 10 mg by mouth daily as needed for allergies.   Cholecalciferol (VITAMIN D-3 PO) Take 1 capsule by mouth daily.   docusate sodium (COLACE) 100 MG capsule Take 100 mg by mouth daily.    esomeprazole (NEXIUM 24HR) 20 MG capsule Take 20 mg by mouth daily at 12 noon.   FIBER PO Take 3 tablets by mouth daily.   fluticasone (FLONASE) 50 MCG/ACT nasal spray Place 1 spray into both nostrils daily as needed for allergies.   glimepiride (AMARYL) 2 MG tablet Take 2 mg by mouth daily with breakfast.   metFORMIN (GLUCOPHAGE) 1000 MG tablet Take 0.5 tablets (500 mg total) by mouth 2 (two) times daily. MAY RESUME ON 10/29/22 (Patient taking differently: Take 500 mg by mouth 2 (two) times daily.)   midodrine (PROAMATINE) 10 MG tablet  Take 1 tablet (10 mg total) by mouth 3 (three) times daily with meals. While awake. Do not lay down for 3-4 hours after taking (Patient taking differently: Take 10 mg by mouth 3 (three) times daily.)   Multiple Vitamins-Minerals (ZINC PO) Take 1 tablet by mouth daily.   Polyethyl Glycol-Propyl Glycol (SYSTANE OP) Place 1 drop into both eyes 2 (two) times daily as needed (dryness, irritation.).   Pyridoxine HCl (VITAMIN B-6 PO) Take 1 tablet by mouth daily.   rosuvastatin (CRESTOR) 5 MG tablet Take 5 mg by mouth every evening.   [DISCONTINUED] apixaban (ELIQUIS) 5 MG TABS tablet Take 1 tablet (5 mg total) by mouth 2 (two) times daily.     ASSESSMENT AND PLAN: .      ICD-10-CM   1. TIA (transient ischemic attack)  G45.9 aspirin (ASPIRIN CHILDRENS) 81 MG chewable tablet    CANCELED: EKG 12-Lead    2. Orthostatic hypotension  I95.1     3. History of atrial fibrillation  Z86.79     4. Loop recorder: [employer] Assert-IQ 3 Loop recorder. Serial # [id number] 12/04/2022  Z95.818       1. TIA (transient ischemic attack) He has not had any further episodes of TIA-like symptoms.  Remains asymptomatic. - EKG 12-Lead - aspirin (ASPIRIN CHILDRENS) 81 MG chewable tablet; Chew 1 tablet (81 mg total) by mouth daily.  2. H/O atrial fibrillation   No evidence of AFib on recent monitoring. Eliquis discontinued due to lack of AFib and upcoming surgery, patient being evaluated for cervical spine fusion.. -Discontinue Eliquis. -Start Aspirin 81mg  daily. -Stop Aspirin one week prior to surgery.  3. Orthostatic hypotension Managed with Midodrine. Patient self-adjusts dose based on blood pressure readings.  He is  very thankful that his life is changed since being on midodrine as he does not have any further significant dizziness and he is now functional.  Wife present and all questions answered. -Continue Midodrine as needed, up to three times daily.  5. Loop recorder: Abbott Assert-IQ 3 Loop recorder. Serial  # [id number] 12/04/2022 I reviewed the recent transmission of loop recorder, he has not had any atrial fibrillation episodes.  I have discontinued Eliquis, he will be on aspirin alone 81 mg daily which she is aware to hold prior to his cervical spine fusion surgery.  I will see him back in a year or sooner if problems.  Signed,  Yates Decamp, MD, [facility] 04/15/2023, 3:00 PM [facility] [address] [place], [place] [address] Phone: [phone]. Fax:  [phone]

## 2023-04-15 NOTE — Patient Instructions (Addendum)
I have discontinued Eliquis today, I would recommend that you go on aspirin 81 mg daily.  Please hold aspirin 1 week prior to your next surgery.      Lab Work: none If you have labs (blood work) drawn today and your tests are completely normal, you will receive your results only by: MyChart Message (if you have MyChart) OR A paper copy in the mail If you have any lab test that is abnormal or we need to change your treatment, we will call you to review the results.   Testing/Procedures: none   Follow-Up: At [facility], you and your health needs are our priority.  As part of our continuing mission to provide you with exceptional heart care, we have created designated Provider Care Teams.  These Care Teams include your primary Cardiologist (physician) and Advanced Practice Providers (APPs -  Physician Assistants and Nurse Practitioners) who all work together to provide you with the care you need, when you need it.  We recommend signing up for the patient portal called "MyChart".  Sign up information is provided on this After Visit Summary.  MyChart is used to connect with patients for Virtual Visits (Telemedicine).  Patients are able to view lab/test results, encounter notes, upcoming appointments, etc.  Non-urgent messages can be sent to your provider as well.   To learn more about what you can do with MyChart, go to [email or web address].    Your next appointment:   12 month(s)  Provider:   Yates Decamp, MD     Other Instructions

## 2023-04-16 ENCOUNTER — Other Ambulatory Visit: Payer: Self-pay | Admitting: Neurosurgery

## 2023-04-16 NOTE — Telephone Encounter (Signed)
Eliquis has been d/c

## 2023-04-16 NOTE — Telephone Encounter (Signed)
I have already sent the letter See letter tab

## 2023-04-22 NOTE — Pre-Procedure Instructions (Signed)
Surgical Instructions   Your procedure is scheduled on May 01, 2023. Report to [facility] Main Entrance "A" at 6:30 A.M., then check in with the Admitting office. Any questions or running late day of surgery: call [phone]  Questions prior to your surgery date: call [phone], Monday-Friday, 8am-4pm. If you experience any cold or flu symptoms such as cough, fever, chills, shortness of breath, etc. between now and your scheduled surgery, please notify us at the above number.     Remember:  Do not eat or drink after midnight the night before your surgery    Take these medicines the morning of surgery with A SIP OF WATER: docusate sodium (COLACE)  esomeprazole (NEXIUM 24HR)  midodrine (PROAMATINE)    May take these medicines IF NEEDED: acetaminophen (TYLENOL)  albuterol (VENTOLIN HFA) 108 (90 Base) MCG/ACT inhaler - please being inhaler with you day of surgery cetirizine (ZYRTEC)  fluticasone (FLONASE) nasal spray  Polyethyl Glycol-Propyl Glycol (SYSTANE OP)    STOP taking your Aspirin one week prior to surgery. Your last dose will be December 3rd.   One week prior to surgery, STOP taking any Aleve, Naproxen, Ibuprofen, Motrin, Advil, Goody's, BC's, all herbal medications, fish oil, and non-prescription vitamins.   WHAT DO I DO ABOUT MY DIABETES MEDICATION?   Do not take metFORMIN (GLUCOPHAGE) the morning of surgery.  Do not take your glimepiride (AMARYL) the evening before surgery or the morning of surgery.   HOW TO MANAGE YOUR DIABETES BEFORE AND AFTER SURGERY  Why is it important to control my blood sugar before and after surgery? Improving blood sugar levels before and after surgery helps healing and can limit problems. A way of improving blood sugar control is eating a healthy diet by:  Eating less sugar and carbohydrates  Increasing activity/exercise  Talking with your doctor about reaching your blood sugar goals High blood sugars (greater than 180  mg/dL) can raise your risk of infections and slow your recovery, so you will need to focus on controlling your diabetes during the weeks before surgery. Make sure that the doctor who takes care of your diabetes knows about your planned surgery including the date and location.  How do I manage my blood sugar before surgery? Check your blood sugar at least 4 times a day, starting 2 days before surgery, to make sure that the level is not too high or low.  Check your blood sugar the morning of your surgery when you wake up and every 2 hours until you get to the Short Stay unit.  If your blood sugar is less than 70 mg/dL, you will need to treat for low blood sugar: Do not take insulin. Treat a low blood sugar (less than 70 mg/dL) with  cup of clear juice (cranberry or apple), 4 glucose tablets, OR glucose gel. Recheck blood sugar in 15 minutes after treatment (to make sure it is greater than 70 mg/dL). If your blood sugar is not greater than 70 mg/dL on recheck, call [phone] for further instructions. Report your blood sugar to the short stay nurse when you get to Short Stay.  If you are admitted to the hospital after surgery: Your blood sugar will be checked by the staff and you will probably be given insulin after surgery (instead of oral diabetes medicines) to make sure you have good blood sugar levels. The goal for blood sugar control after surgery is 80-180 mg/dL.  Do NOT Smoke (Tobacco/Vaping) for 24 hours prior to your procedure.  If you use a CPAP at night, you may bring your mask/headgear for your overnight stay.   You will be asked to remove any contacts, glasses, piercing's, hearing aid's, dentures/partials prior to surgery. Please bring cases for these items if needed.    Patients discharged the day of surgery will not be allowed to drive home, and someone needs to stay with them for 24 hours.  SURGICAL WAITING ROOM VISITATION Patients may have no more  than 2 support people in the waiting area - these visitors may rotate.   Pre-op nurse will coordinate an appropriate time for 1 ADULT support person, who may not rotate, to accompany patient in pre-op.  Children under the age of 69 must have an adult with them who is not the patient and must remain in the main waiting area with an adult.  If the patient needs to stay at the hospital during part of their recovery, the visitor guidelines for inpatient rooms apply.  Please refer to the [facility] website for the visitor guidelines for any additional information.   If you received a COVID test during your pre-op visit  it is requested that you wear a mask when out in public, stay away from anyone that may not be feeling well and notify your surgeon if you develop symptoms. If you have been in contact with anyone that has tested positive in the last 10 days please notify you surgeon.      Pre-operative 5 CHG Bathing Instructions   You can play a key role in reducing the risk of infection after surgery. Your skin needs to be as free of germs as possible. You can reduce the number of germs on your skin by washing with CHG (chlorhexidine gluconate) soap before surgery. CHG is an antiseptic soap that kills germs and continues to kill germs even after washing.   DO NOT use if you have an allergy to chlorhexidine/CHG or antibacterial soaps. If your skin becomes reddened or irritated, stop using the CHG and notify one of our RNs at [phone].   Please shower with the CHG soap starting 4 days before surgery using the following schedule:     Please keep in mind the following:  DO NOT shave, including legs and underarms, starting the day of your first shower.   You may shave your face at any point before/day of surgery.  Place clean sheets on your bed the day you start using CHG soap. Use a clean washcloth (not used since being washed) for each shower. DO NOT sleep with pets once you start using  the CHG.   CHG Shower Instructions:  Wash your face and private area with normal soap. If you choose to wash your hair, wash first with your normal shampoo.  After you use shampoo/soap, rinse your hair and body thoroughly to remove shampoo/soap residue.  Turn the water OFF and apply about 3 tablespoons (45 ml) of CHG soap to a CLEAN washcloth.  Apply CHG soap ONLY FROM YOUR NECK DOWN TO YOUR TOES (washing for 3-5 minutes)  DO NOT use CHG soap on face, private areas, open wounds, or sores.  Pay special attention to the area where your surgery is being performed.  If you are having back surgery, having someone wash your back for you may be helpful. Wait 2 minutes after CHG soap is applied, then you may rinse off the CHG soap.  Pat dry with a  clean towel  Put on clean clothes/pajamas   If you choose to wear lotion, please use ONLY the CHG-compatible lotions on the back of this paper.   Additional instructions for the day of surgery: DO NOT APPLY any lotions, deodorants, cologne, or perfumes.   Do not bring valuables to the hospital. [facility] is not responsible for any belongings/valuables. Do not wear nail polish, gel polish, artificial nails, or any other type of covering on natural nails (fingers and toes) Do not wear jewelry or makeup Put on clean/comfortable clothes.  Please brush your teeth.  Ask your nurse before applying any prescription medications to the skin.     CHG Compatible Lotions   Aveeno Moisturizing lotion  Cetaphil Moisturizing Cream  Cetaphil Moisturizing Lotion  Clairol Herbal Essence Moisturizing Lotion, Dry Skin  Clairol Herbal Essence Moisturizing Lotion, Extra Dry Skin  Clairol Herbal Essence Moisturizing Lotion, Normal Skin  Curel Age Defying Therapeutic Moisturizing Lotion with Alpha Hydroxy  Curel Extreme Care Body Lotion  Curel Soothing Hands Moisturizing Hand Lotion  Curel Therapeutic Moisturizing Cream, Fragrance-Free  Curel Therapeutic  Moisturizing Lotion, Fragrance-Free  Curel Therapeutic Moisturizing Lotion, Original Formula  Eucerin Daily Replenishing Lotion  Eucerin Dry Skin Therapy Plus Alpha Hydroxy Crme  Eucerin Dry Skin Therapy Plus Alpha Hydroxy Lotion  Eucerin Original Crme  Eucerin Original Lotion  Eucerin Plus Crme Eucerin Plus Lotion  Eucerin TriLipid Replenishing Lotion  Keri Anti-Bacterial Hand Lotion  Keri Deep Conditioning Original Lotion Dry Skin Formula Softly Scented  Keri Deep Conditioning Original Lotion, Fragrance Free Sensitive Skin Formula  Keri Lotion Fast Absorbing Fragrance Free Sensitive Skin Formula  Keri Lotion Fast Absorbing Softly Scented Dry Skin Formula  Keri Original Lotion  Keri Skin Renewal Lotion Keri Silky Smooth Lotion  Keri Silky Smooth Sensitive Skin Lotion  Nivea Body Creamy Conditioning Oil  Nivea Body Extra Enriched Lotion  Nivea Body Original Lotion  Nivea Body Sheer Moisturizing Lotion Nivea Crme  Nivea Skin Firming Lotion  NutraDerm 30 Skin Lotion  NutraDerm Skin Lotion  NutraDerm Therapeutic Skin Cream  NutraDerm Therapeutic Skin Lotion  ProShield Protective Hand Cream  Provon moisturizing lotion  Please read over the following fact sheets that you were given.

## 2023-04-23 ENCOUNTER — Encounter (HOSPITAL_COMMUNITY): Payer: Self-pay

## 2023-04-23 ENCOUNTER — Encounter (HOSPITAL_COMMUNITY)
Admission: RE | Admit: 2023-04-23 | Discharge: 2023-04-23 | Disposition: A | Discharge status: Home / Self Care | Payer: Medicare Other | Source: Ambulatory Visit | Attending: Neurosurgery | Admitting: Neurosurgery

## 2023-04-23 ENCOUNTER — Other Ambulatory Visit: Payer: Self-pay

## 2023-04-23 VITALS — BP 167/90 | HR 73 | Temp 97.7°F | Resp 18

## 2023-04-23 DIAGNOSIS — E119 Type 2 diabetes mellitus without complications: Secondary | ICD-10-CM | POA: Diagnosis not present

## 2023-04-23 DIAGNOSIS — Z79899 Other long term (current) drug therapy: Secondary | ICD-10-CM | POA: Diagnosis not present

## 2023-04-23 DIAGNOSIS — I480 Paroxysmal atrial fibrillation: Secondary | ICD-10-CM | POA: Insufficient documentation

## 2023-04-23 DIAGNOSIS — Z7982 Long term (current) use of aspirin: Secondary | ICD-10-CM | POA: Insufficient documentation

## 2023-04-23 DIAGNOSIS — E785 Hyperlipidemia, unspecified: Secondary | ICD-10-CM | POA: Insufficient documentation

## 2023-04-23 DIAGNOSIS — I10 Essential (primary) hypertension: Secondary | ICD-10-CM | POA: Diagnosis not present

## 2023-04-23 DIAGNOSIS — Z01818 Encounter for other preprocedural examination: Secondary | ICD-10-CM

## 2023-04-23 DIAGNOSIS — I951 Orthostatic hypotension: Secondary | ICD-10-CM | POA: Diagnosis not present

## 2023-04-23 DIAGNOSIS — Z01812 Encounter for preprocedural laboratory examination: Secondary | ICD-10-CM | POA: Insufficient documentation

## 2023-04-23 DIAGNOSIS — M4802 Spinal stenosis, cervical region: Secondary | ICD-10-CM | POA: Diagnosis not present

## 2023-04-23 HISTORY — DX: Cerebral infarction, unspecified: I63.9

## 2023-04-23 HISTORY — DX: Unspecified osteoarthritis, unspecified site: M19.90

## 2023-04-23 HISTORY — DX: Gastro-esophageal reflux disease without esophagitis: K21.9

## 2023-04-23 HISTORY — DX: Chronic kidney disease, unspecified: N18.9

## 2023-04-23 HISTORY — DX: Pneumonia, unspecified organism: J18.9

## 2023-04-23 HISTORY — DX: Cardiac arrhythmia, unspecified: I49.9

## 2023-04-23 HISTORY — DX: Personal history of urinary calculi: Z87.442

## 2023-04-23 LAB — BASIC METABOLIC PANEL
Anion gap: 8 (ref 5–15)
BUN: 24 mg/dL — ABNORMAL HIGH (ref 8–23)
CO2: 23 mmol/L (ref 22–32)
Calcium: 9.8 mg/dL (ref 8.9–10.3)
Chloride: 105 mmol/L (ref 98–111)
Creatinine, Ser: 1.44 mg/dL — ABNORMAL HIGH (ref 0.61–1.24)
GFR, Estimated: 49 mL/min — ABNORMAL LOW (ref >60)
Glucose, Bld: 148 mg/dL — ABNORMAL HIGH (ref 70–99)
Potassium: 4.9 mmol/L (ref 3.5–5.1)
Sodium: 136 mmol/L (ref 135–145)

## 2023-04-23 LAB — HEMOGLOBIN A1C
Hgb A1c MFr Bld: 6.9 % — ABNORMAL HIGH (ref 4.8–5.6)
Mean Plasma Glucose: 151.33 mg/dL

## 2023-04-23 LAB — GLUCOSE, CAPILLARY: Glucose-Capillary: 173 mg/dL — ABNORMAL HIGH (ref 70–99)

## 2023-04-23 LAB — CBC
HCT: 30.4 % — ABNORMAL LOW (ref 39.0–52.0)
Hemoglobin: 9.3 g/dL — ABNORMAL LOW (ref 13.0–17.0)
MCH: 31.8 pg (ref 26.0–34.0)
MCHC: 30.6 g/dL (ref 30.0–36.0)
MCV: 104.1 fL — ABNORMAL HIGH (ref 80.0–100.0)
Platelets: 111 10*3/uL — ABNORMAL LOW (ref 150–400)
RBC: 2.92 MIL/uL — ABNORMAL LOW (ref 4.22–5.81)
RDW: 15.1 % (ref 11.5–15.5)
WBC: 109.7 10*3/uL (ref 4.0–10.5)
nRBC: 0 % (ref 0.0–0.2)

## 2023-04-23 LAB — SURGICAL PCR SCREEN
MRSA, PCR: NEGATIVE
Staphylococcus aureus: NEGATIVE

## 2023-04-23 NOTE — Progress Notes (Signed)
Critical lab value: WBC 109.7  Discussed with Antionette Poles, PA-C. Pt has CLL with chronically elevated WBC. PA will reach out to pts oncologist with result.

## 2023-04-23 NOTE — Progress Notes (Signed)
PCP - Dr. Aliene Beams Cardiologist - Dr. Yates Decamp - last office visit 04/15/2023  PPM/ICD - Denies - pt has loop recorder for monitoring A.Fib Device Orders - n/a Rep Notified - n/a  Chest x-ray - 10/26/2022 EKG - 11/05/2022 Stress Test - Per pt, many years ago ECHO - 10/26/2022 Cardiac Cath - 2017  Sleep Study - Denies CPAP - n/a  Pt is DM2. He checks his blood sugar every morning. Fasting blood sugar 140-170s. CBG at pre-op appointment 173. A1c result pending.  Last dose of GLP1 agonist- n/a GLP1 instructions: n/a  Blood Thinner Instructions: n/a - pt was taken off Eliquis a few weeks ago Aspirin Instructions: Pt instructed to hold ASA for one week. Last dose will be Dec. 3rd.  NPO after midnight  COVID TEST- n/a   Anesthesia review: Yes. Cardiac clearance (A.Fib, hypotension, loop recorder implanted), CKD3, DM2, Chronic Leukemia (chronically elevated WBC)   Patient denies shortness of breath, fever, cough and chest pain at PAT appointment. Pt denies any respiratory illness/infection in the last two months.    All instructions explained to the patient, with a verbal understanding of the material. Patient agrees to go over the instructions while at home for a better understanding. Patient also instructed to self quarantine after being tested for COVID-19. The opportunity to ask questions was provided.

## 2023-04-24 LAB — PATHOLOGIST SMEAR REVIEW

## 2023-04-25 NOTE — Progress Notes (Signed)
Anesthesia Chart Review:   82 year old male follows with cardiology for history of HLD, HTN, paroxysmal A-fib s/p loop recorder, palpitations and near syncopal spells due to severe orthostatic hypotension.  He is maintained on Florinef daily, midodrine as needed.  Reportedly, his symptoms improved since increasing hydration and using support stockings.  Last seen by Dr. Jacinto Halim 04/15/2023 for preop evaluation.  Per note, "I reviewed the recent transmission of loop recorder, he has not had any atrial fibrillation episodes. I have discontinued Eliquis, he will be on aspirin alone 81 mg daily which she is aware to hold prior to his cervical spine fusion surgery. I will see him back in a year or sooner if problems."  Follows with hematologist/oncologist Dr. Candise Che for history of CLL.  Last seen 01/07/2023.  Per note, "Patient has no clear lab or clinical evidence of CLL progression needing treatment for his CLL at this time. "   Preop labs showed significant jump in WBC 64.3 >> 109.7 I reached out to Dr. Candise Che regarding elevated WBC and downtrending Hgb in the setting of upcoming surgery. He advised, "those labs do represent a notable change in wbc counts and hgb levels since he was last seen by [place] about 4 months ago. It the surgery is emmergent/urgent and he is not having any other new symptoms of CLL progression he would proceed with an understanding of increased risk of infection and increased risk of needing transfusion support. if its semi-elective we would official see him for a pre-op evaluation."   I reached out to Dr. Lindalou Hose office and he advised that due to the severity of the cervical stenosis, surgery should proceed as scheduled.  I did notify Dr. Candise Che of this recommendation.  EKG 11/05/2022: Normal sinus rhythm at rate of 65 bpm, left anterior fascicular block.  TTE 10/26/2022:  1. Left ventricular ejection fraction, by estimation, is 60 to 65%. The  left ventricle has normal function. The left  ventricle has no regional  wall motion abnormalities. There is mild concentric left ventricular  hypertrophy. Left ventricular diastolic  parameters are consistent with Grade I diastolic dysfunction (impaired  relaxation).   2. Right ventricular systolic function is normal. The right ventricular  size is normal.   3. Left atrial size was mildly dilated.   4. The mitral valve is abnormal. Mild mitral valve regurgitation. No  evidence of mitral stenosis.   5. The aortic valve is normal in structure. Aortic valve regurgitation is  trivial. No aortic stenosis is present.   6. Aortic dilatation noted. There is borderline dilatation of the aortic  root, measuring 38 mm.   7. The inferior vena cava is normal in size with greater than 50%  respiratory variability, suggesting right atrial pressure of 3 mmHg.     Amandeep, Jonak [facility]/Anesthesiology Phone [phone] 04/25/2023 2:02 PM

## 2023-04-25 NOTE — Anesthesia Preprocedure Evaluation (Addendum)
Anesthesia Evaluation  Patient identified by MRN, date of birth, ID band Patient awake    Reviewed: Allergy & Precautions, H&P , NPO status , Patient's Chart, lab work & pertinent test results  Airway Mallampati: II  TM Distance: >3 FB Neck ROM: Full    Dental no notable dental hx.    Pulmonary former smoker   Pulmonary exam normal breath sounds clear to auscultation       Cardiovascular hypertension, Normal cardiovascular exam+ dysrhythmias Atrial Fibrillation  Rhythm:Regular Rate:Normal   1. Left ventricular ejection fraction, by estimation, is 60 to 65%. The  left ventricle has normal function. The left ventricle has no regional  wall motion abnormalities. There is mild concentric left ventricular  hypertrophy. Left ventricular diastolic  parameters are consistent with Grade I diastolic dysfunction (impaired  relaxation).   2. Right ventricular systolic function is normal. The right ventricular  size is normal.   3. Left atrial size was mildly dilated.   4. The mitral valve is abnormal. Mild mitral valve regurgitation. No  evidence of mitral stenosis.   5. The aortic valve is normal in structure. Aortic valve regurgitation is  trivial. No aortic stenosis is present.   6. Aortic dilatation noted. There is borderline dilatation of the aortic  root, measuring 38 mm.   7. The inferior vena cava is normal in size with greater than 50%  respiratory variability, suggesting right atrial pressure of 3 mmHg.     Neuro/Psych CVA, No Residual Symptoms  negative psych ROS   GI/Hepatic Neg liver ROS,GERD  ,,  Endo/Other  negative endocrine ROSdiabetes, Type 2    Renal/GU CRFRenal disease  negative genitourinary   Musculoskeletal negative musculoskeletal ROS (+)    Abdominal   Peds negative pediatric ROS (+)  Hematology cll   Anesthesia Other Findings   Reproductive/Obstetrics negative OB ROS                               Anesthesia Physical Anesthesia Plan  ASA: 3  Anesthesia Plan: General   Post-op Pain Management: Tylenol PO (pre-op)*   Induction: Intravenous  PONV Risk Score and Plan: 3 and Ondansetron, Dexamethasone and Treatment may vary due to age or medical condition  Airway Management Planned: Oral ETT and Video Laryngoscope Planned  Additional Equipment: Arterial line  Intra-op Plan:   Post-operative Plan: Extubation in OR  Informed Consent: I have reviewed the patients History and Physical, chart, labs and discussed the procedure including the risks, benefits and alternatives for the proposed anesthesia with the patient or authorized representative who has indicated his/her understanding and acceptance.     Dental advisory given  Plan Discussed with: CRNA  Anesthesia Plan Comments: (PAT note by Antionette Poles, PA-C: 82 year old male follows with cardiology for history of HLD, HTN, paroxysmal A-fib s/p loop recorder, palpitations and near syncopal spells due to severe orthostatic hypotension.  He is maintained on Florinef daily, midodrine as needed.  Reportedly, his symptoms improved since increasing hydration and using support stockings.  Last seen by Dr. Jacinto Halim 04/15/2023 for preop evaluation.  Per note, "I reviewed the recent transmission of loop recorder, he has not had any atrial fibrillation episodes. I have discontinued Eliquis, he will be on aspirin alone 81 mg daily which she is aware to hold prior to his cervical spine fusion surgery. I will see him back in a year or sooner if problems."  Follows with hematologist/oncologist Dr. Candise Che for history of  CLL.  Last seen 01/07/2023.  Per note, "Patient has no clear lab or clinical evidence of CLL progression needing treatment for his CLL at this time. "   Preop labs showed significant jump in WBC 64.3 >> 109.7 I reached out to Dr. Candise Che regarding elevated WBC and downtrending Hgb in the  setting of upcoming surgery. He advised, "those labs do represent a notable change in wbc counts and hgb levels since he was last seen by [place] about 4 months ago. It the surgery is emmergent/urgent and he is not having any other new symptoms of CLL progression he would proceed with an understanding of increased risk of infection and increased risk of needing transfusion support. if its semi-elective we would official see him for a pre-op evaluation."   I reached out to Dr. Lindalou Hose office and he advised that due to the severity of the cervical stenosis, surgery should proceed as scheduled.  I did notify Dr. Candise Che of this recommendation.  EKG 11/05/2022: Normal sinus rhythm at rate of 65 bpm, left anterior fascicular block.  TTE 10/26/2022:  1. Left ventricular ejection fraction, by estimation, is 60 to 65%. The  left ventricle has normal function. The left ventricle has no regional  wall motion abnormalities. There is mild concentric left ventricular  hypertrophy. Left ventricular diastolic  parameters are consistent with Grade I diastolic dysfunction (impaired  relaxation).   2. Right ventricular systolic function is normal. The right ventricular  size is normal.   3. Left atrial size was mildly dilated.   4. The mitral valve is abnormal. Mild mitral valve regurgitation. No  evidence of mitral stenosis.   5. The aortic valve is normal in structure. Aortic valve regurgitation is  trivial. No aortic stenosis is present.   6. Aortic dilatation noted. There is borderline dilatation of the aortic  root, measuring 38 mm.   7. The inferior vena cava is normal in size with greater than 50%  respiratory variability, suggesting right atrial pressure of 3 mmHg.    )         Anesthesia Quick Evaluation

## 2023-05-01 ENCOUNTER — Other Ambulatory Visit: Payer: Self-pay

## 2023-05-01 ENCOUNTER — Observation Stay (HOSPITAL_COMMUNITY)
Admission: AD | Admit: 2023-05-01 | Discharge: 2023-05-02 | Disposition: A | Discharge status: Home / Self Care | Payer: Medicare Other | Attending: Neurosurgery | Admitting: Neurosurgery

## 2023-05-01 ENCOUNTER — Ambulatory Visit (HOSPITAL_COMMUNITY): Payer: Medicare Other | Admitting: Physician Assistant

## 2023-05-01 ENCOUNTER — Ambulatory Visit (HOSPITAL_COMMUNITY): Payer: Medicare Other

## 2023-05-01 ENCOUNTER — Encounter (HOSPITAL_COMMUNITY): Payer: Self-pay | Admitting: Neurosurgery

## 2023-05-01 ENCOUNTER — Ambulatory Visit (HOSPITAL_BASED_OUTPATIENT_CLINIC_OR_DEPARTMENT_OTHER): Payer: Medicare Other

## 2023-05-01 ENCOUNTER — Encounter (HOSPITAL_COMMUNITY)
Admission: AD | Disposition: A | Discharge status: Home / Self Care | Payer: Self-pay | Source: Home / Self Care | Attending: Neurosurgery

## 2023-05-01 DIAGNOSIS — I129 Hypertensive chronic kidney disease with stage 1 through stage 4 chronic kidney disease, or unspecified chronic kidney disease: Secondary | ICD-10-CM | POA: Diagnosis not present

## 2023-05-01 DIAGNOSIS — M4802 Spinal stenosis, cervical region: Secondary | ICD-10-CM | POA: Insufficient documentation

## 2023-05-01 DIAGNOSIS — M4722 Other spondylosis with radiculopathy, cervical region: Secondary | ICD-10-CM | POA: Insufficient documentation

## 2023-05-01 DIAGNOSIS — Z79899 Other long term (current) drug therapy: Secondary | ICD-10-CM | POA: Insufficient documentation

## 2023-05-01 DIAGNOSIS — Z7982 Long term (current) use of aspirin: Secondary | ICD-10-CM | POA: Insufficient documentation

## 2023-05-01 DIAGNOSIS — Z7984 Long term (current) use of oral hypoglycemic drugs: Secondary | ICD-10-CM | POA: Diagnosis not present

## 2023-05-01 DIAGNOSIS — I4891 Unspecified atrial fibrillation: Secondary | ICD-10-CM | POA: Diagnosis not present

## 2023-05-01 DIAGNOSIS — N183 Chronic kidney disease, stage 3 unspecified: Secondary | ICD-10-CM | POA: Insufficient documentation

## 2023-05-01 DIAGNOSIS — M4712 Other spondylosis with myelopathy, cervical region: Principal | ICD-10-CM | POA: Insufficient documentation

## 2023-05-01 DIAGNOSIS — M5412 Radiculopathy, cervical region: Secondary | ICD-10-CM | POA: Diagnosis not present

## 2023-05-01 DIAGNOSIS — Z87891 Personal history of nicotine dependence: Secondary | ICD-10-CM | POA: Insufficient documentation

## 2023-05-01 DIAGNOSIS — Z8673 Personal history of transient ischemic attack (TIA), and cerebral infarction without residual deficits: Secondary | ICD-10-CM | POA: Diagnosis not present

## 2023-05-01 DIAGNOSIS — E1122 Type 2 diabetes mellitus with diabetic chronic kidney disease: Secondary | ICD-10-CM | POA: Insufficient documentation

## 2023-05-01 DIAGNOSIS — E119 Type 2 diabetes mellitus without complications: Secondary | ICD-10-CM

## 2023-05-01 HISTORY — PX: ANTERIOR CERVICAL DECOMP/DISCECTOMY FUSION: SHX1161

## 2023-05-01 LAB — TYPE AND SCREEN
ABO/RH(D): O POS
Antibody Screen: NEGATIVE

## 2023-05-01 LAB — ABO/RH: ABO/RH(D): O POS

## 2023-05-01 LAB — GLUCOSE, CAPILLARY
Glucose-Capillary: 146 mg/dL — ABNORMAL HIGH (ref 70–99)
Glucose-Capillary: 165 mg/dL — ABNORMAL HIGH (ref 70–99)
Glucose-Capillary: 247 mg/dL — ABNORMAL HIGH (ref 70–99)
Glucose-Capillary: 228 mg/dL — ABNORMAL HIGH (ref 70–99)

## 2023-05-01 SURGERY — ANTERIOR CERVICAL DECOMPRESSION/DISCECTOMY FUSION 2 LEVELS
Anesthesia: General

## 2023-05-01 MED ORDER — THROMBIN 20000 UNITS EX SOLR
CUTANEOUS | Status: DC | PRN
  Administered 2023-05-01: 20 mL via TOPICAL

## 2023-05-01 MED ORDER — ONDANSETRON HCL 4 MG PO TABS: 4.0000 mg | ORAL_TABLET | Freq: Four times a day (QID) | ORAL | Status: DC | PRN

## 2023-05-01 MED ORDER — ACETAMINOPHEN 650 MG RE SUPP: 650.0000 mg | RECTAL | Status: DC | PRN

## 2023-05-01 MED ORDER — PHENYLEPHRINE HCL-NACL 20-0.9 MG/250ML-% IV SOLN
INTRAVENOUS | Status: DC | PRN
  Administered 2023-05-01: 25 ug/min via INTRAVENOUS

## 2023-05-01 MED ORDER — CHLORHEXIDINE GLUCONATE CLOTH 2 % EX PADS: 6.0000 | MEDICATED_PAD | Freq: Once | CUTANEOUS | Status: DC

## 2023-05-01 MED ORDER — ONDANSETRON HCL 4 MG/2ML IJ SOLN
INTRAMUSCULAR | Status: DC | PRN
  Administered 2023-05-01: 4 mg via INTRAVENOUS

## 2023-05-01 MED ORDER — VITAMIN B-6 100 MG PO TABS
100.0000 mg | ORAL_TABLET | Freq: Every day | ORAL | Status: DC
  Filled 2023-05-01: qty 1

## 2023-05-01 MED ORDER — CEFAZOLIN SODIUM-DEXTROSE 1-4 GM/50ML-% IV SOLN
1.0000 g | Freq: Three times a day (TID) | INTRAVENOUS | Status: AC
  Administered 2023-05-01 (×2): 1 g via INTRAVENOUS
  Filled 2023-05-01 (×2): qty 50

## 2023-05-01 MED ORDER — ACETAMINOPHEN 10 MG/ML IV SOLN: 1000.0000 mg | Freq: Once | INTRAVENOUS | Status: DC | PRN

## 2023-05-01 MED ORDER — PHENOL 1.4 % MT LIQD: 1.0000 | OROMUCOSAL | Status: DC | PRN

## 2023-05-01 MED ORDER — ONDANSETRON HCL 4 MG/2ML IJ SOLN
INTRAMUSCULAR | Status: AC
  Filled 2023-05-01: qty 2

## 2023-05-01 MED ORDER — MIDODRINE HCL 5 MG PO TABS
10.0000 mg | ORAL_TABLET | Freq: Three times a day (TID) | ORAL | Status: DC
  Administered 2023-05-01 – 2023-05-02 (×2): 10 mg via ORAL
  Filled 2023-05-01 (×4): qty 2

## 2023-05-01 MED ORDER — HYDROCODONE-ACETAMINOPHEN 10-325 MG PO TABS: 2.0000 | ORAL_TABLET | ORAL | Status: DC | PRN

## 2023-05-01 MED ORDER — LIDOCAINE 2% (20 MG/ML) 5 ML SYRINGE
INTRAMUSCULAR | Status: AC
  Filled 2023-05-01: qty 5

## 2023-05-01 MED ORDER — SODIUM CHLORIDE 0.9% FLUSH: 3.0000 mL | Freq: Two times a day (BID) | INTRAVENOUS | Status: DC

## 2023-05-01 MED ORDER — LIDOCAINE 2% (20 MG/ML) 5 ML SYRINGE
INTRAMUSCULAR | Status: DC | PRN
  Administered 2023-05-01: 60 mg via INTRAVENOUS

## 2023-05-01 MED ORDER — MIDAZOLAM HCL 2 MG/2ML IJ SOLN
INTRAMUSCULAR | Status: AC
  Filled 2023-05-01: qty 2

## 2023-05-01 MED ORDER — ACETAMINOPHEN 325 MG PO TABS
650.0000 mg | ORAL_TABLET | ORAL | Status: DC | PRN
  Administered 2023-05-01 – 2023-05-02 (×4): 650 mg via ORAL
  Filled 2023-05-01 (×4): qty 2

## 2023-05-01 MED ORDER — CYCLOBENZAPRINE HCL 10 MG PO TABS
10.0000 mg | ORAL_TABLET | Freq: Three times a day (TID) | ORAL | Status: DC | PRN
Start: 2023-05-01 — End: 2023-05-02
  Administered 2023-05-01: 10 mg via ORAL
  Filled 2023-05-01: qty 1

## 2023-05-01 MED ORDER — INSULIN ASPART 100 UNIT/ML IJ SOLN
0.0000 [IU] | INTRAMUSCULAR | Status: DC | PRN
Start: 2023-05-01 — End: 2023-05-01

## 2023-05-01 MED ORDER — VITAMIN D 25 MCG (1000 UNIT) PO TABS
125.0000 ug | ORAL_TABLET | Freq: Every day | ORAL | Status: DC
  Filled 2023-05-01: qty 5

## 2023-05-01 MED ORDER — PHENYLEPHRINE 80 MCG/ML (10ML) SYRINGE FOR IV PUSH (FOR BLOOD PRESSURE SUPPORT)
PREFILLED_SYRINGE | INTRAVENOUS | Status: DC | PRN
  Administered 2023-05-01 (×2): 80 ug via INTRAVENOUS

## 2023-05-01 MED ORDER — MENTHOL 3 MG MT LOZG
1.0000 | LOZENGE | OROMUCOSAL | Status: DC | PRN
  Filled 2023-05-01 (×2): qty 9

## 2023-05-01 MED ORDER — INSULIN ASPART 100 UNIT/ML IJ SOLN: 0.0000 [IU] | Freq: Every day | INTRAMUSCULAR | Status: DC

## 2023-05-01 MED ORDER — MIDAZOLAM HCL 2 MG/2ML IJ SOLN
INTRAMUSCULAR | Status: DC | PRN
  Administered 2023-05-01: 1 mg via INTRAVENOUS

## 2023-05-01 MED ORDER — FENTANYL CITRATE (PF) 250 MCG/5ML IJ SOLN
INTRAMUSCULAR | Status: AC
  Filled 2023-05-01: qty 5

## 2023-05-01 MED ORDER — PROPOFOL 10 MG/ML IV BOLUS
INTRAVENOUS | Status: DC | PRN
  Administered 2023-05-01: 200 mg via INTRAVENOUS

## 2023-05-01 MED ORDER — ROCURONIUM BROMIDE 10 MG/ML (PF) SYRINGE
PREFILLED_SYRINGE | INTRAVENOUS | Status: DC | PRN
  Administered 2023-05-01 (×2): 50 mg via INTRAVENOUS

## 2023-05-01 MED ORDER — FENTANYL CITRATE (PF) 100 MCG/2ML IJ SOLN
25.0000 ug | INTRAMUSCULAR | Status: DC | PRN
  Administered 2023-05-01 (×2): 25 ug via INTRAVENOUS
  Administered 2023-05-01: 50 ug via INTRAVENOUS

## 2023-05-01 MED ORDER — ALBUTEROL SULFATE (2.5 MG/3ML) 0.083% IN NEBU: 3.0000 mL | INHALATION_SOLUTION | RESPIRATORY_TRACT | Status: DC | PRN

## 2023-05-01 MED ORDER — ORAL CARE MOUTH RINSE: 15.0000 mL | Freq: Once | OROMUCOSAL | Status: AC

## 2023-05-01 MED ORDER — SODIUM CHLORIDE 0.9% FLUSH: 3.0000 mL | INTRAVENOUS | Status: DC | PRN

## 2023-05-01 MED ORDER — CHLORHEXIDINE GLUCONATE 0.12 % MT SOLN
15.0000 mL | Freq: Once | OROMUCOSAL | Status: AC
  Administered 2023-05-01: 15 mL via OROMUCOSAL
  Filled 2023-05-01: qty 15

## 2023-05-01 MED ORDER — GLIMEPIRIDE 2 MG PO TABS
2.0000 mg | ORAL_TABLET | Freq: Every day | ORAL | Status: DC
  Administered 2023-05-02: 2 mg via ORAL
  Filled 2023-05-01: qty 1

## 2023-05-01 MED ORDER — THROMBIN 5000 UNITS EX SOLR
CUTANEOUS | Status: AC
  Filled 2023-05-01: qty 5000

## 2023-05-01 MED ORDER — LORATADINE 10 MG PO TABS
10.0000 mg | ORAL_TABLET | Freq: Every day | ORAL | Status: DC
  Administered 2023-05-01: 10 mg via ORAL
  Filled 2023-05-01: qty 1

## 2023-05-01 MED ORDER — ONDANSETRON HCL 4 MG/2ML IJ SOLN
4.0000 mg | Freq: Four times a day (QID) | INTRAMUSCULAR | Status: DC | PRN
Start: 2023-05-01 — End: 2023-05-02

## 2023-05-01 MED ORDER — PROPOFOL 10 MG/ML IV BOLUS
INTRAVENOUS | Status: AC
  Filled 2023-05-01: qty 20

## 2023-05-01 MED ORDER — CEFAZOLIN SODIUM-DEXTROSE 2-4 GM/100ML-% IV SOLN
2.0000 g | INTRAVENOUS | Status: AC
  Administered 2023-05-01: 2 g via INTRAVENOUS
  Filled 2023-05-01: qty 100

## 2023-05-01 MED ORDER — VITAMIN C 500 MG PO TABS
500.0000 mg | ORAL_TABLET | Freq: Every day | ORAL | Status: DC
  Filled 2023-05-01: qty 1

## 2023-05-01 MED ORDER — INSULIN ASPART 100 UNIT/ML IJ SOLN: 0.0000 [IU] | Freq: Three times a day (TID) | INTRAMUSCULAR | Status: DC

## 2023-05-01 MED ORDER — SUCCINYLCHOLINE CHLORIDE 200 MG/10ML IV SOSY
PREFILLED_SYRINGE | INTRAVENOUS | Status: DC | PRN
  Administered 2023-05-01: 60 mg via INTRAVENOUS

## 2023-05-01 MED ORDER — HYDROCODONE-ACETAMINOPHEN 5-325 MG PO TABS: 1.0000 | ORAL_TABLET | ORAL | Status: DC | PRN

## 2023-05-01 MED ORDER — DROPERIDOL 2.5 MG/ML IJ SOLN: 0.6250 mg | Freq: Once | INTRAMUSCULAR | Status: DC | PRN

## 2023-05-01 MED ORDER — SODIUM CHLORIDE 0.9 % IV SOLN: 250.0000 mL | INTRAVENOUS | Status: DC

## 2023-05-01 MED ORDER — FLUTICASONE PROPIONATE 50 MCG/ACT NA SUSP: 1.0000 | Freq: Every day | NASAL | Status: DC | PRN

## 2023-05-01 MED ORDER — SUGAMMADEX SODIUM 200 MG/2ML IV SOLN
INTRAVENOUS | Status: DC | PRN
  Administered 2023-05-01: 200 mg via INTRAVENOUS

## 2023-05-01 MED ORDER — 0.9 % SODIUM CHLORIDE (POUR BTL) OPTIME
TOPICAL | Status: DC | PRN
  Administered 2023-05-01: 1000 mL

## 2023-05-01 MED ORDER — DOCUSATE SODIUM 100 MG PO CAPS
100.0000 mg | ORAL_CAPSULE | Freq: Every day | ORAL | Status: DC
  Administered 2023-05-01: 100 mg via ORAL
  Filled 2023-05-01: qty 1

## 2023-05-01 MED ORDER — HYDROMORPHONE HCL 1 MG/ML IJ SOLN: 1.0000 mg | INTRAMUSCULAR | Status: DC | PRN

## 2023-05-01 MED ORDER — SUCCINYLCHOLINE CHLORIDE 200 MG/10ML IV SOSY
PREFILLED_SYRINGE | INTRAVENOUS | Status: AC
  Filled 2023-05-01: qty 10

## 2023-05-01 MED ORDER — METFORMIN HCL 500 MG PO TABS: 500.0000 mg | ORAL_TABLET | Freq: Two times a day (BID) | ORAL | Status: DC

## 2023-05-01 MED ORDER — PHENYLEPHRINE 80 MCG/ML (10ML) SYRINGE FOR IV PUSH (FOR BLOOD PRESSURE SUPPORT)
PREFILLED_SYRINGE | INTRAVENOUS | Status: AC
  Filled 2023-05-01: qty 10

## 2023-05-01 MED ORDER — LACTATED RINGERS IV SOLN: INTRAVENOUS | Status: DC

## 2023-05-01 MED ORDER — FENTANYL CITRATE (PF) 250 MCG/5ML IJ SOLN
INTRAMUSCULAR | Status: DC | PRN
  Administered 2023-05-01 (×2): 50 ug via INTRAVENOUS

## 2023-05-01 MED ORDER — THROMBIN 20000 UNITS EX SOLR
CUTANEOUS | Status: AC
  Filled 2023-05-01: qty 20000

## 2023-05-01 MED ORDER — DEXAMETHASONE SODIUM PHOSPHATE 10 MG/ML IJ SOLN
INTRAMUSCULAR | Status: DC | PRN
  Administered 2023-05-01: 4 mg via INTRAVENOUS

## 2023-05-01 MED ORDER — ROCURONIUM BROMIDE 10 MG/ML (PF) SYRINGE
PREFILLED_SYRINGE | INTRAVENOUS | Status: AC
  Filled 2023-05-01: qty 10

## 2023-05-01 MED ORDER — METFORMIN HCL 500 MG PO TABS
500.0000 mg | ORAL_TABLET | Freq: Two times a day (BID) | ORAL | Status: DC
  Administered 2023-05-01 – 2023-05-02 (×2): 500 mg via ORAL
  Filled 2023-05-01 (×2): qty 1

## 2023-05-01 MED ORDER — FENTANYL CITRATE (PF) 100 MCG/2ML IJ SOLN
INTRAMUSCULAR | Status: AC
  Filled 2023-05-01: qty 2

## 2023-05-01 MED ORDER — DEXAMETHASONE SODIUM PHOSPHATE 10 MG/ML IJ SOLN
INTRAMUSCULAR | Status: AC
  Filled 2023-05-01: qty 1

## 2023-05-01 MED ORDER — PANTOPRAZOLE SODIUM 40 MG PO TBEC
40.0000 mg | DELAYED_RELEASE_TABLET | Freq: Every day | ORAL | Status: DC
Start: 2023-05-01 — End: 2023-05-02
  Administered 2023-05-01: 40 mg via ORAL
  Filled 2023-05-01: qty 1

## 2023-05-01 MED ORDER — ROSUVASTATIN CALCIUM 5 MG PO TABS
5.0000 mg | ORAL_TABLET | Freq: Every evening | ORAL | Status: DC
  Administered 2023-05-01: 5 mg via ORAL
  Filled 2023-05-01: qty 1

## 2023-05-01 MED ORDER — POLYETHYL GLYCOL-PROPYL GLYCOL 0.4-0.3 % OP GEL: Freq: Two times a day (BID) | OPHTHALMIC | Status: DC | PRN

## 2023-05-01 SURGICAL SUPPLY — 45 items
BAG COUNTER SPONGE SURGICOUNT (BAG) ×1 IMPLANT
BENZOIN TINCTURE PRP APPL 2/3 (GAUZE/BANDAGES/DRESSINGS) ×1 IMPLANT
BIT DRILL 13 (BIT) IMPLANT
BUR MATCHSTICK NEURO 3.0 LAGG (BURR) ×1 IMPLANT
CANISTER SUCT 3000ML PPV (MISCELLANEOUS) ×1 IMPLANT
DRAPE C-ARM 42X72 X-RAY (DRAPES) ×2 IMPLANT
DRAPE LAPAROTOMY 100X72 PEDS (DRAPES) ×1 IMPLANT
DRAPE MICROSCOPE SLANT 54X150 (MISCELLANEOUS) ×1 IMPLANT
DURAPREP 6ML APPLICATOR 50/CS (WOUND CARE) ×1 IMPLANT
ELECT COATED BLADE 2.86 ST (ELECTRODE) ×1 IMPLANT
ELECT REM PT RETURN 9FT ADLT (ELECTROSURGICAL) ×1
ELECTRODE REM PT RTRN 9FT ADLT (ELECTROSURGICAL) ×1 IMPLANT
GAUZE 4X4 16PLY ~~LOC~~+RFID DBL (SPONGE) IMPLANT
GAUZE SPONGE 4X4 12PLY STRL (GAUZE/BANDAGES/DRESSINGS) ×1 IMPLANT
GLOVE ECLIPSE 9.0 STRL (GLOVE) ×1 IMPLANT
GLOVE EXAM NITRILE XL STR (GLOVE) IMPLANT
GOWN STRL REUS W/ TWL LRG LVL3 (GOWN DISPOSABLE) IMPLANT
GOWN STRL REUS W/ TWL XL LVL3 (GOWN DISPOSABLE) ×1 IMPLANT
GOWN STRL REUS W/TWL 2XL LVL3 (GOWN DISPOSABLE) IMPLANT
HALTER HD/CHIN CERV TRACTION D (MISCELLANEOUS) ×1 IMPLANT
HEMOSTAT POWDER KIT SURGIFOAM (HEMOSTASIS) IMPLANT
HEMOSTAT SURGICEL 2X14 (HEMOSTASIS) IMPLANT
KIT BASIN OR (CUSTOM PROCEDURE TRAY) ×1 IMPLANT
KIT TURNOVER KIT B (KITS) ×1 IMPLANT
NDL SPNL 20GX3.5 QUINCKE YW (NEEDLE) ×1 IMPLANT
NEEDLE SPNL 20GX3.5 QUINCKE YW (NEEDLE) ×1
NS IRRIG 1000ML POUR BTL (IV SOLUTION) ×1 IMPLANT
PACK LAMINECTOMY NEURO (CUSTOM PROCEDURE TRAY) ×1 IMPLANT
PAD ARMBOARD 7.5X6 YLW CONV (MISCELLANEOUS) ×3 IMPLANT
PLATE 45XATL VS ELT (Plate) IMPLANT
SCREW ST 13X4XST VA NS SPNE (Screw) IMPLANT
SPACER SPNL 11X14X7XPEEK CVD (Cage) IMPLANT
SPCR SPNL 11X14X7XPEEK CVD (Cage) ×2 IMPLANT
SPONGE INTESTINAL PEANUT (DISPOSABLE) ×1 IMPLANT
SPONGE SURGIFOAM ABS GEL 100 (HEMOSTASIS) IMPLANT
SPONGE SURGIFOAM ABS GEL SZ50 (HEMOSTASIS) ×1 IMPLANT
STRIP CLOSURE SKIN 1/2X4 (GAUZE/BANDAGES/DRESSINGS) ×1 IMPLANT
SUT VIC AB 3-0 SH 8-18 (SUTURE) ×1 IMPLANT
SUT VIC AB 4-0 RB1 18 (SUTURE) ×1 IMPLANT
TAPE CLOTH 4X10 WHT NS (GAUZE/BANDAGES/DRESSINGS) ×1 IMPLANT
TAPE CLOTH SURG 4X10 WHT LF (GAUZE/BANDAGES/DRESSINGS) IMPLANT
TOWEL GREEN STERILE (TOWEL DISPOSABLE) ×1 IMPLANT
TOWEL GREEN STERILE FF (TOWEL DISPOSABLE) ×1 IMPLANT
TRAP SPECIMEN MUCUS 40CC (MISCELLANEOUS) ×1 IMPLANT
WATER STERILE IRR 1000ML POUR (IV SOLUTION) ×1 IMPLANT

## 2023-05-01 NOTE — Anesthesia Postprocedure Evaluation (Signed)
Anesthesia Post Note  Patient: Eric Harrington  Procedure(s) Performed: Anterior Cervical Decompression/ Discectomy Fusion Cervical Four-Cervical Five - Cervical Five-Cervical Six     Patient location during evaluation: PACU Anesthesia Type: General Level of consciousness: awake and alert Pain management: pain level controlled Vital Signs Assessment: post-procedure vital signs reviewed and stable Respiratory status: spontaneous breathing, nonlabored ventilation, respiratory function stable and patient connected to nasal cannula oxygen Cardiovascular status: blood pressure returned to baseline and stable Postop Assessment: no apparent nausea or vomiting Anesthetic complications: yes   Encounter Notable Events  Notable Event Outcome Phase Comment  Difficult to intubate - expected  Intraprocedure Filed from anesthesia note documentation.    Last Vitals:  Vitals:   05/01/23 1200 05/01/23 1226  BP: (!) 160/74 (!) 147/76  Pulse: 75 72  Resp: 18 18  Temp: (P) 36.7 C 37.1 C  SpO2: 97% 98%    Last Pain:  Vitals:   05/01/23 1320  TempSrc:   PainSc: 3                  [name]

## 2023-05-01 NOTE — Transfer of Care (Signed)
Immediate Anesthesia Transfer of Care Note  Patient: Eric Harrington  Procedure(s) Performed: Anterior Cervical Decompression/ Discectomy Fusion Cervical Four-Cervical Five - Cervical Five-Cervical Six  Patient Location: Endoscopy Unit  Anesthesia Type:General  Level of Consciousness: drowsy and patient cooperative  Airway & Oxygen Therapy: Patient Spontanous Breathing and Patient connected to nasal cannula oxygen  Post-op Assessment: Report given to RN and Post -op Vital signs reviewed and stable  Post vital signs: Reviewed and stable  Last Vitals:  Vitals Value Taken Time  BP 161/79 05/01/23 1056  Temp 36.7 C 05/01/23 1056  Pulse 72 05/01/23 1058  Resp 13 05/01/23 1058  SpO2 98 % 05/01/23 1058  Vitals shown include unfiled device data.  Last Pain:  Vitals:   05/01/23 0640  TempSrc: Oral  PainSc: 2          Complications:  Encounter Notable Events  Notable Event Outcome Phase Comment  Difficult to intubate - expected  Intraprocedure Filed from anesthesia note documentation.

## 2023-05-01 NOTE — Op Note (Signed)
Date of procedure: 05/01/2023  Date of dictation: Same  Service: Neurosurgery  Preoperative diagnosis: C4-5 C5-6 spondylosis with stenosis and myelopathy  Postoperative diagnosis: Same  Procedure Name: C4-5, C5-6 anterior cervical discectomy with interbody fusion utilizing interbody cages, local harvested autograft, and anterior plate instrumentation  Surgeon:Quintella Mura A.Amya Hlad, M.D.  Asst. Surgeon: Doran Durand, NP  Anesthesia: General  Indication: 82 year old male with history of chronic lymphocytic leukemia presents with worsening neck and bilateral upper extremity symptoms with increasing gait instability.  Workup demonstrates evidence of marked cervical spondylosis with severe stenosis and ongoing cord compression at C4-5 and C5-6.  Patient presents now for two-level anterior cervical decompression and fusion in hopes improving his symptoms.  Operative note: After induction of anesthesia, patient positioned supine with neck slightly extended and held placeholder traction.  Patient's anterior cervical region prepped draped sterilely.  Incision made overlying C5.  Dissection performed in the right.  Retractor placed.  Fluoroscopy used.  Levels confirmed.  Disc bases incised at both levels.  Discectomy then performed using various instruments down to level of the posterior annulus.  Microscope was then brought to the field used throughout the remainder of the discectomy.  Remaining aspects of annulus and osteophytes were removed using high-speed drill down to level of the posterior longitudinal ligament.  Posterior logical was then elevated and resected in a piecemeal fashion.  Underlying thecal sac was identified.  A wide central decompression then performed undercutting the bodies of C4 and C5.  Decompression then proceeded to each neural foramina.  Wide anterior foraminotomies performed on the course the exiting C5 nerve roots bilaterally.  At this point a very thorough decompression been achieved.   There was no evidence of injury to the thecal sac or nerve roots.  Procedures then repeated at C5-6 again withou complications.  Wound was then irrigated hemostasis was achieved with Gelfoam sponges and then which were then removed.  [employer] anatomic peek cage was then packed with locally harvested autograft.  Each cage was then packed into place where they were recessed slightly from the anterior cortical margins.  Atlantis anterior cervical plate was then placed over the C4, C5 and C6 levels.  This infection fluoroscopic guidance using 13 mm variable angle screws to each at all 3 levels.  All screws given final tightening found to be solidly within the bone.  Locking screws engaged at all 3 levels.  Final images reveal good position of the cages and the hardware at the proper level with normal alignment of spine.  Wound was then irrigated.  Hemostasis was assured.  Wounds then closed in layers with Vicryl sutures.  Steri-Strips and sterile dressing were applied.  Patient tolerated procedure well and he returns to the recovery room postop.

## 2023-05-01 NOTE — Brief Op Note (Signed)
05/01/2023  10:46 AM  PATIENT:  Eric Harrington  82 y.o. male  PRE-OPERATIVE DIAGNOSIS:  Stenosis  POST-OPERATIVE DIAGNOSIS:  Stenosis  PROCEDURE:  Procedure(s) with comments: Anterior Cervical Decompression/ Discectomy Fusion Cervical Four-Cervical Five - Cervical Five-Cervical Six (N/A) - 3C  SURGEON:  Surgeons and Role:    Julio Sicks, MD - Primary  PHYSICIAN ASSISTANT:   ASSISTANTSMarland Mcalpine   ANESTHESIA:   general  EBL:  150 mL   BLOOD ADMINISTERED:none  DRAINS: none   LOCAL MEDICATIONS USED:  NONE  SPECIMEN:  No Specimen  DISPOSITION OF SPECIMEN:  N/A  COUNTS:  YES  TOURNIQUET:  * No tourniquets in log *  DICTATION: .Dragon Dictation  PLAN OF CARE: Admit for overnight observation  PATIENT DISPOSITION:  PACU - hemodynamically stable.   Delay start of Pharmacological VTE agent (>24hrs) due to surgical blood loss or risk of bleeding: yes

## 2023-05-01 NOTE — Anesthesia Procedure Notes (Signed)
Procedure Name: Intubation Date/Time: 05/01/2023 8:55 AM  Performed by: Hilda Lias, CRNAPre-anesthesia Checklist: Patient identified, Emergency Drugs available, Suction available and Patient being monitored Patient Re-evaluated:Patient Re-evaluated prior to induction Oxygen Delivery Method: Circle System Utilized Preoxygenation: Pre-oxygenation with 100% oxygen Induction Type: IV induction Ventilation: Mask ventilation without difficulty Laryngoscope Size: Glidescope and 4 Grade View: Grade IV Tube type: Oral Tube size: 8.0 mm Number of attempts: 1 Airway Equipment and Method: Stylet and Oral airway Placement Confirmation: ETT inserted through vocal cords under direct vision, positive ETCO2 and breath sounds checked- equal and bilateral Secured at: 23 cm Tube secured with: Tape Dental Injury: Teeth and Oropharynx as per pre-operative assessment  Difficulty Due To: Difficulty was anticipated and Difficult Airway- due to reduced neck mobility Comments: Easy intubation with gldescope

## 2023-05-01 NOTE — H&P (Signed)
Eric Harrington is an 82 y.o. male.   Chief Complaint: Neck pain HPI: 82 year old male with progressive posterior cervical pain and radiating pain numbness and weakness to both upper and lower extremities.  Workup demonstrates evidence of severe cervical spondylosis with stenosis with ongoing cord compression at C4-5 and C5-6.  Patient presents now for two-level anterior cervical decompression and fusion in hopes improving his symptoms.  Past Medical History:  Diagnosis Date   Arthritis    Chronic kidney disease    CKD3   Chronic Leukemia    Diabetes mellitus without complication (HCC)    Dysrhythmia    A. Fib   GERD (gastroesophageal reflux disease)    History of kidney stones    Hyperlipemia 12/02/2018   Hyperlipidemia    Hypertension    Hx. Now has lower BP issues. On midodrine   Loop recorder: Abbott Assert-IQ 3 Loop recorder. Serial # [id number] 12/04/2022 12/04/2022   Pneumonia    Stroke [facility])    TIA x2 since Dec 2023. No deficits   Type 2 diabetes mellitus with complication, without long-term current use of insulin (HCC) 12/02/2018    Past Surgical History:  Procedure Laterality Date   CARDIAC CATHETERIZATION  2017   CATARACT EXTRACTION W/ INTRAOCULAR LENS IMPLANT Bilateral    COLONOSCOPY     VENTRAL HERNIA REPAIR      Family History  Problem Relation Age of Onset   Lung cancer Mother    Heart attack Father    Dementia Sister    Diabetes Brother    Diabetes Sister    Heart attack Brother    Social History:  reports that he quit smoking about 44 years ago. His smoking use included cigarettes. He started smoking about 74 years ago. He has a 30 pack-year smoking history. He has never used smokeless tobacco. He reports that he does not currently use drugs. He reports that he does not drink alcohol.  Allergies:  Allergies  Allergen Reactions   Norvasc [Amlodipine Besylate] Rash    Fatigue  Fever  Chills    Augmentin [Amoxicillin-Pot Clavulanate] Other (See Comments)     Stomach upset   Bystolic [Nebivolol Hcl] Other (See Comments)    Weakness    Coq10 [Coenzyme Q10] Other (See Comments)    Unknown reaction   Cozaar [Losartan] Other (See Comments)    Unknown reaction   Farxiga [Dapagliflozin] Other (See Comments)    Excessive urination Dehydration   Glucotrol [Glipizide] Other (See Comments)    Unknown reaction   Januvia [Sitagliptin] Other (See Comments)    Unknown reaction   Keflex [Cephalexin] Other (See Comments)    Unknown reaction   Lipitor [Atorvastatin Calcium]    Lipitor [Atorvastatin] Other (See Comments)    Unknown reaction   Lopid [Gemfibrozil] Other (See Comments)    Unknown reaction   Omnicef [Cefdinir] Other (See Comments)    Unknown reaction   Prilosec [Omeprazole] Other (See Comments)    Unknown reaction   Toprol Xl [Metoprolol] Other (See Comments)    Weakness    Vibra-Tab [Doxycycline] Other (See Comments)    Fever  Chills   Zestril [Lisinopril] Other (See Comments)    Constipation     Zetia [Ezetimibe] Other (See Comments)    Unknown reaction   Zocor [Simvastatin] Other (See Comments)    Unknown reaction   Neurontin [Gabapentin] Other (See Comments)    Unknown reaction    Medications Prior to Admission  Medication Sig Dispense Refill   acetaminophen (TYLENOL) 500  MG tablet Take 1,000 mg by mouth every 8 (eight) hours as needed for moderate pain, fever or headache.     ASCORBIC ACID PO Take 1 tablet by mouth daily. Vitamin C.     aspirin (ASPIRIN CHILDRENS) 81 MG chewable tablet Chew 1 tablet (81 mg total) by mouth daily.     cetirizine (ZYRTEC) 10 MG tablet Take 10 mg by mouth daily as needed for allergies.     Cholecalciferol (VITAMIN D-3 PO) Take 1 capsule by mouth daily.     docusate sodium (COLACE) 100 MG capsule Take 100 mg by mouth daily.      esomeprazole (NEXIUM 24HR) 20 MG capsule Take 20 mg by mouth daily at 12 noon.     FIBER PO Take 3 tablets by mouth daily.     glimepiride (AMARYL) 2 MG tablet  Take 2 mg by mouth daily with breakfast.     metFORMIN (GLUCOPHAGE) 500 MG tablet Take 500 mg by mouth 2 (two) times daily with a meal.     midodrine (PROAMATINE) 10 MG tablet Take 1 tablet (10 mg total) by mouth 3 (three) times daily with meals. While awake. Do not lay down for 3-4 hours after taking 270 tablet 3   Multiple Vitamins-Minerals (ZINC PO) Take 1 tablet by mouth daily.     Polyethyl Glycol-Propyl Glycol (SYSTANE OP) Place 1 drop into both eyes 2 (two) times daily as needed (dryness, irritation.).     Pyridoxine HCl (VITAMIN B-6 PO) Take 1 tablet by mouth daily.     rosuvastatin (CRESTOR) 5 MG tablet Take 5 mg by mouth every evening.     albuterol (VENTOLIN HFA) 108 (90 Base) MCG/ACT inhaler Inhale 1 puff into the lungs every 4 (four) hours as needed for wheezing or shortness of breath.     fluticasone (FLONASE) 50 MCG/ACT nasal spray Place 1 spray into both nostrils daily as needed for allergies.     metFORMIN (GLUCOPHAGE) 1000 MG tablet Take 0.5 tablets (500 mg total) by mouth 2 (two) times daily. MAY RESUME ON 10/29/22 (Patient not taking: Reported on 04/17/2023)      Results for orders placed or performed during the hospital encounter of 05/01/23 (from the past 48 hour(s))  Glucose, capillary     Status: Abnormal   Collection Time: 05/01/23  6:42 AM  Result Value Ref Range   Glucose-Capillary 146 (H) 70 - 99 mg/dL    Comment: Glucose reference range applies only to samples taken after fasting for at least 8 hours.   No results found.  Pertinent items noted in HPI and remainder of comprehensive ROS otherwise negative.  Blood pressure 139/69, pulse 60, temperature 97.9 F (36.6 C), temperature source Oral, resp. rate 17, height 5\' 9"  (1.753 m), weight 76.2 kg, SpO2 98%.  Patient is awake and alert.  He is oriented and appropriate.  Speech is fluent.  Judgment insight are intact.  Cranial nerve function normal by.  Motor examination reveals 4/5 strength in both grips and  intrinsic functions in each upper extremity.  Lower extremity strength 4+/5 with some spasticity.  Reflexes are hyperactive.  Positive Hoffmann's response in his left hand equivocal on the right.  Examination head ears eyes nose and throat is unremarked.  Chest and abdomen are benign.  Extremities are free from injury deformity. Assessment/Plan C4-5, C5-6 stenosis with myelopathy.  Plan C4-5, C5-6 anterior cervical discectomy with interbody fusion utilizing interbody cage, local harvested autograft, and anterior plate's potation.  Risks and benefits been explained.  Patient wishes to proceed.  Sherilyn Cooter A Cyana Shook 05/01/2023, 8:14 AM

## 2023-05-01 NOTE — Progress Notes (Signed)
Orthopedic Tech Progress Note Patient Details:  Eric Harrington 1940/09/18 [id number]  Soft collar applied Ortho Devices Type of Ortho Device: Soft collar Ortho Device/Splint Interventions: Ordered, Application   Post Interventions Patient Tolerated: Well Instructions Provided: Adjustment of device, Care of device  Diannia Ruder 05/01/2023, 12:43 PM

## 2023-05-01 NOTE — Progress Notes (Addendum)
Eric Harrington is an 82 y.o. male.   Chief Complaint: Neck pain HPI: 82 year old male with progressive posterior cervical pain and radiating pain numbness and weakness to both upper and lower extremities.  Workup demonstrates evidence of severe cervical spondylosis with stenosis with ongoing cord compression at C4-5 and C5-6.  Patient presents now for two-level anterior cervical decompression and fusion in hopes improving his symptoms.  Past Medical History:  Diagnosis Date   Arthritis    Chronic kidney disease    CKD3   Chronic Leukemia    Diabetes mellitus without complication (HCC)    Dysrhythmia    A. Fib   GERD (gastroesophageal reflux disease)    History of kidney stones    Hyperlipemia 12/02/2018   Hyperlipidemia    Hypertension    Hx. Now has lower BP issues. On midodrine   Loop recorder: [employer] Assert-IQ 3 Loop recorder. Serial # [id number] 12/04/2022 12/04/2022   Pneumonia    Stroke [facility])    TIA x2 since Dec 2023. No deficits   Type 2 diabetes mellitus with complication, without long-term current use of insulin (HCC) 12/02/2018    Past Surgical History:  Procedure Laterality Date   CARDIAC CATHETERIZATION  2017   CATARACT EXTRACTION W/ INTRAOCULAR LENS IMPLANT Bilateral    COLONOSCOPY     VENTRAL HERNIA REPAIR      Family History  Problem Relation Age of Onset   Lung cancer Mother    Heart attack Father    Dementia Sister    Diabetes Brother    Diabetes Sister    Heart attack Brother    Social History:  reports that he quit smoking about 44 years ago. His smoking use included cigarettes. He started smoking about 74 years ago. He has a 30 pack-year smoking history. He has never used smokeless tobacco. He reports that he does not currently use drugs. He reports that he does not drink alcohol.  Allergies:  Allergies  Allergen Reactions   Norvasc [Amlodipine Besylate] Rash    Fatigue  Fever  Chills    Augmentin [Amoxicillin-Pot Clavulanate] Other (See Comments)     Stomach upset   Bystolic [Nebivolol Hcl] Other (See Comments)    Weakness    Coq10 [Coenzyme Q10] Other (See Comments)    Unknown reaction   Cozaar [Losartan] Other (See Comments)    Unknown reaction   Farxiga [Dapagliflozin] Other (See Comments)    Excessive urination Dehydration   Glucotrol [Glipizide] Other (See Comments)    Unknown reaction   Januvia [Sitagliptin] Other (See Comments)    Unknown reaction   Keflex [Cephalexin] Other (See Comments)    Unknown reaction   Lipitor [Atorvastatin Calcium]    Lipitor [Atorvastatin] Other (See Comments)    Unknown reaction   Lopid [Gemfibrozil] Other (See Comments)    Unknown reaction   Omnicef [Cefdinir] Other (See Comments)    Unknown reaction   Prilosec [Omeprazole] Other (See Comments)    Unknown reaction   Toprol Xl [Metoprolol] Other (See Comments)    Weakness    Vibra-Tab [Doxycycline] Other (See Comments)    Fever  Chills   Zestril [Lisinopril] Other (See Comments)    Constipation     Zetia [Ezetimibe] Other (See Comments)    Unknown reaction   Zocor [Simvastatin] Other (See Comments)    Unknown reaction   Neurontin [Gabapentin] Other (See Comments)    Unknown reaction    Medications Prior to Admission  Medication Sig Dispense Refill   acetaminophen (TYLENOL) 500  MG tablet Take 1,000 mg by mouth every 8 (eight) hours as needed for moderate pain, fever or headache.     ASCORBIC ACID PO Take 1 tablet by mouth daily. Vitamin C.     aspirin (ASPIRIN CHILDRENS) 81 MG chewable tablet Chew 1 tablet (81 mg total) by mouth daily.     cetirizine (ZYRTEC) 10 MG tablet Take 10 mg by mouth daily as needed for allergies.     Cholecalciferol (VITAMIN D-3 PO) Take 1 capsule by mouth daily.     docusate sodium (COLACE) 100 MG capsule Take 100 mg by mouth daily.      esomeprazole (NEXIUM 24HR) 20 MG capsule Take 20 mg by mouth daily at 12 noon.     FIBER PO Take 3 tablets by mouth daily.     glimepiride (AMARYL) 2 MG tablet  Take 2 mg by mouth daily with breakfast.     metFORMIN (GLUCOPHAGE) 500 MG tablet Take 500 mg by mouth 2 (two) times daily with a meal.     midodrine (PROAMATINE) 10 MG tablet Take 1 tablet (10 mg total) by mouth 3 (three) times daily with meals. While awake. Do not lay down for 3-4 hours after taking 270 tablet 3   Multiple Vitamins-Minerals (ZINC PO) Take 1 tablet by mouth daily.     Polyethyl Glycol-Propyl Glycol (SYSTANE OP) Place 1 drop into both eyes 2 (two) times daily as needed (dryness, irritation.).     Pyridoxine HCl (VITAMIN B-6 PO) Take 1 tablet by mouth daily.     rosuvastatin (CRESTOR) 5 MG tablet Take 5 mg by mouth every evening.     albuterol (VENTOLIN HFA) 108 (90 Base) MCG/ACT inhaler Inhale 1 puff into the lungs every 4 (four) hours as needed for wheezing or shortness of breath.     fluticasone (FLONASE) 50 MCG/ACT nasal spray Place 1 spray into both nostrils daily as needed for allergies.     metFORMIN (GLUCOPHAGE) 1000 MG tablet Take 0.5 tablets (500 mg total) by mouth 2 (two) times daily. MAY RESUME ON 10/29/22 (Patient not taking: Reported on 04/17/2023)      Results for orders placed or performed during the hospital encounter of 05/01/23 (from the past 48 hour(s))  Glucose, capillary     Status: Abnormal   Collection Time: 05/01/23  6:42 AM  Result Value Ref Range   Glucose-Capillary 146 (H) 70 - 99 mg/dL    Comment: Glucose reference range applies only to samples taken after fasting for at least 8 hours.   No results found.  Pertinent items noted in HPI and remainder of comprehensive ROS otherwise negative.  Blood pressure 139/69, pulse 60, temperature 97.9 F (36.6 C), temperature source Oral, resp. rate 17, height 5\' 9"  (1.753 m), weight 76.2 kg, SpO2 98%.  Patient is awake and alert.  He is oriented and appropriate.  Speech is fluent.  Judgment insight are intact.  Cranial nerve function normal by.  Motor examination reveals 4/5 strength in both grips and  intrinsic functions in each upper extremity.  Lower extremity strength 4+/5 with some spasticity.  Reflexes are hyperactive.  Positive Hoffmann's response in his left hand equivocal on the right.  Examination head ears eyes nose and throat is unremarked.  Chest and abdomen are benign.  Extremities are free from injury deformity. Assessment/Plan C4-5, C5-6 stenosis with myelopathy.  Plan C4-5, C5-6 anterior cervical discectomy with interbody fusion utilizing interbody cage, local harvested autograft, and anterior plate's potation.  Risks and benefits been explained.  Patient wishes to proceed.  Sherilyn Cooter A Meygan Kyser 05/01/2023, 8:14 AM

## 2023-05-01 NOTE — Anesthesia Procedure Notes (Signed)
Arterial Line Insertion Start/End12/03/2023 7:30 AM, 05/01/2023 7:36 AM Performed by: Hilda Lias, CRNA, CRNA  Patient location: Pre-op. Preanesthetic checklist: patient identified, IV checked, site marked, risks and benefits discussed, surgical consent, monitors and equipment checked, pre-op evaluation, timeout performed and anesthesia consent Lidocaine 1% used for infiltration radial was placed Catheter size: 20 G Hand hygiene performed  and maximum sterile barriers used   Attempts: 1 Procedure performed without using ultrasound guided technique. Following insertion, dressing applied and Biopatch. Post procedure assessment: normal and unchanged  Patient tolerated the procedure well with no immediate complications.

## 2023-05-02 ENCOUNTER — Encounter (HOSPITAL_COMMUNITY): Payer: Self-pay | Admitting: Neurosurgery

## 2023-05-02 DIAGNOSIS — M4712 Other spondylosis with myelopathy, cervical region: Secondary | ICD-10-CM | POA: Diagnosis not present

## 2023-05-02 LAB — GLUCOSE, CAPILLARY: Glucose-Capillary: 134 mg/dL — ABNORMAL HIGH (ref 70–99)

## 2023-05-02 MED ORDER — HYDROCODONE-ACETAMINOPHEN 5-325 MG PO TABS: 1.0000 | ORAL_TABLET | ORAL | 0 refills | Status: DC | PRN

## 2023-05-02 NOTE — Discharge Summary (Signed)
Physician Discharge Summary  Patient ID: Eric Harrington MRN: [id number] DOB/AGE: Aug 23, 1940 82 y.o.  Admit date: 05/01/2023 Discharge date: 05/02/2023  Admission Diagnoses:  Discharge Diagnoses:  Principal Problem:   Cervical spondylosis with myelopathy and radiculopathy   Discharged Condition: good  Hospital Course: Patient admitted to the hospital where he underwent uncomplicated two-level anterior cervical decompression and fusion for treatment of his compressive cervical myelopathy.  Postoperatively doing very well.  Preoperative neck and upper extremity symptoms much improved.  Standing ambulating and voiding without difficulty.  Ready for discharge home.  Consults:   Significant Diagnostic Studies:   Treatments:   Discharge Exam: Blood pressure (!) 160/79, pulse 85, temperature 98.3 F (36.8 C), temperature source Oral, resp. rate 18, height 5\' 9"  (1.753 m), weight 76.2 kg, SpO2 99%. Awake and alert.  Oriented and appropriate.  Motor and sensory function intact.  Wound clean and dry.  Neck soft.  Chest and abdomen benign.  Disposition: Discharge disposition: 01-Home or Self Care        Allergies as of 05/02/2023       Reactions   Norvasc [amlodipine Besylate] Rash   Fatigue  Fever  Chills    Augmentin [amoxicillin-pot Clavulanate] Other (See Comments)   Stomach upset   Bystolic [nebivolol Hcl] Other (See Comments)   Weakness    Coq10 [coenzyme Q10] Other (See Comments)   Unknown reaction   Cozaar [losartan] Other (See Comments)   Unknown reaction   Farxiga [dapagliflozin] Other (See Comments)   Excessive urination Dehydration   Glucotrol [glipizide] Other (See Comments)   Unknown reaction   Januvia [sitagliptin] Other (See Comments)   Unknown reaction   Keflex [cephalexin] Other (See Comments)   Unknown reaction   Lipitor [atorvastatin Calcium]    Lipitor [atorvastatin] Other (See Comments)   Unknown reaction   Lopid [gemfibrozil] Other (See  Comments)   Unknown reaction   Omnicef [cefdinir] Other (See Comments)   Unknown reaction   Prilosec [omeprazole] Other (See Comments)   Unknown reaction   Toprol Xl [metoprolol] Other (See Comments)   Weakness    Vibra-tab [doxycycline] Other (See Comments)   Fever  Chills   Zestril [lisinopril] Other (See Comments)   Constipation    Zetia [ezetimibe] Other (See Comments)   Unknown reaction   Zocor [simvastatin] Other (See Comments)   Unknown reaction   Neurontin [gabapentin] Other (See Comments)   Unknown reaction        Medication List     TAKE these medications    acetaminophen 500 MG tablet Commonly known as: TYLENOL Take 1,000 mg by mouth every 8 (eight) hours as needed for moderate pain, fever or headache.   albuterol 108 (90 Base) MCG/ACT inhaler Commonly known as: VENTOLIN HFA Inhale 1 puff into the lungs every 4 (four) hours as needed for wheezing or shortness of breath.   ASCORBIC ACID PO Take 1 tablet by mouth daily. Vitamin C.   aspirin 81 MG chewable tablet Commonly known as: Aspirin Childrens Chew 1 tablet (81 mg total) by mouth daily.   cetirizine 10 MG tablet Commonly known as: ZYRTEC Take 10 mg by mouth daily as needed for allergies.   docusate sodium 100 MG capsule Commonly known as: COLACE Take 100 mg by mouth daily.   FIBER PO Take 3 tablets by mouth daily.   fluticasone 50 MCG/ACT nasal spray Commonly known as: FLONASE Place 1 spray into both nostrils daily as needed for allergies.   glimepiride 2 MG tablet Commonly known as:  AMARYL Take 2 mg by mouth daily with breakfast.   HYDROcodone-acetaminophen 5-325 MG tablet Commonly known as: NORCO/VICODIN Take 1 tablet by mouth every 4 (four) hours as needed for moderate pain (pain score 4-6) ((score 4 to 6)).   metFORMIN 500 MG tablet Commonly known as: GLUCOPHAGE Take 500 mg by mouth 2 (two) times daily with a meal.   metFORMIN 1000 MG tablet Commonly known as: GLUCOPHAGE Take  0.5 tablets (500 mg total) by mouth 2 (two) times daily. MAY RESUME ON 10/29/22   midodrine 10 MG tablet Commonly known as: PROAMATINE Take 1 tablet (10 mg total) by mouth 3 (three) times daily with meals. While awake. Do not lay down for 3-4 hours after taking   NexIUM 24HR 20 MG capsule Generic drug: esomeprazole Take 20 mg by mouth daily at 12 noon.   rosuvastatin 5 MG tablet Commonly known as: CRESTOR Take 5 mg by mouth every evening.   SYSTANE OP Place 1 drop into both eyes 2 (two) times daily as needed (dryness, irritation.).   VITAMIN B-6 PO Take 1 tablet by mouth daily.   VITAMIN D-3 PO Take 1 capsule by mouth daily.   ZINC PO Take 1 tablet by mouth daily.         SignedKathaleen Maser Willis Kuipers 05/02/2023, 9:21 AM

## 2023-05-02 NOTE — Evaluation (Signed)
Occupational Therapy Evaluation and Discharge Patient Details Name: Eric Harrington MRN: [id number] DOB: 03/04/1941 Today's Date: 05/02/2023   History of Present Illness Pt is a 82 yo male s/p C4-5, C5-6 anterior cervical discectomy with interbody fusion. PHMx: PAF, CLL, CKD, DM2, orthostatic hypotension.   Clinical Impression   This 82 yo male admitted and underwent above presents to acute OT with all education completed with pt and wife was well as post op cervical handout provided. No further OT needs, we will sign off. No PT needs identified--made PT aware.       If plan is discharge home, recommend the following: A little help with bathing/dressing/bathroom;Assistance with cooking/housework;Assist for transportation    Functional Status Assessment  Patient has had a recent decline in their functional status and demonstrates the ability to make significant improvements in function in a reasonable and predictable amount of time. (without further need for skilled OT)  Equipment Recommendations  None recommended by OT       Precautions / Restrictions Precautions Precautions: Cervical Precaution Booklet Issued: Yes (comment) Required Braces or Orthoses: Cervical Brace Cervical Brace: Soft collar Restrictions Weight Bearing Restrictions Per Provider Order: No      Mobility Bed Mobility Overal bed mobility: Modified Independent                  Transfers Overall transfer level: Modified independent Equipment used: Rolling walker (2 wheels)                      Balance Overall balance assessment: Mild deficits observed, not formally tested (without RW)                                         ADL either performed or assessed with clinical judgement   ADL                                         General ADL Comments: Pt already dressed upon my arrival, educated pt on use of cups with straws when drinking and he said that  this does not work for him--makes him cough too much which hurts his neck--so had him try without a straw and no issues with coughing just a sore throat (he is aware that as he gets toward the end of the drink to tip the cup up not his head back), educated on not bending over due to strain on neck, educated on rolling to get up OOB, educated on protecting neck collar when eating or mouth care with washcloth tucked between c-collar and neck.     Vision Patient Visual Report: No change from baseline              Pertinent Vitals/Pain Pain Assessment Pain Assessment: Faces Faces Pain Scale: Hurts little more Pain Location: throat Pain Descriptors / Indicators: Sore Pain Intervention(s):  (RN brought in some cough drops)     Extremity/Trunk Assessment Upper Extremity Assessment Upper Extremity Assessment: Overall WFL for tasks assessed           Communication Communication Communication: Hearing impairment   Cognition Arousal: Alert Behavior During Therapy: WFL for tasks assessed/performed Overall Cognitive Status: Within Functional Limits for tasks assessed  Home Living Family/patient expects to be discharged to:: Private residence Living Arrangements: Spouse/significant other Available Help at Discharge: Available 24 hours/day Type of Home: House Home Access: Level entry     Home Layout: One level     Bathroom Shower/Tub: Producer, television/film/video: Standard     Home Equipment: Grab bars - tub/shower;Rollator (4 wheels);Cane - single point;Shower seat - built in          Prior Functioning/Environment Prior Level of Function : Independent/Modified Independent             Mobility Comments: modI with SPC outdoors, rollator inside at night to go to the bathroom          OT Problem List: Decreased range of motion;Impaired balance (sitting and/or standing);Pain         OT  Goals(Current goals can be found in the care plan section) Acute Rehab OT Goals Patient Stated Goal: to go home today         AM-PAC OT "6 Clicks" Daily Activity     Outcome Measure Help from another person eating meals?: None Help from another person taking care of personal grooming?: None Help from another person toileting, which includes using toliet, bedpan, or urinal?: None Help from another person bathing (including washing, rinsing, drying)?: A Little Help from another person to put on and taking off regular upper body clothing?: A Little Help from another person to put on and taking off regular lower body clothing?: A Little 6 Click Score: 21   End of Session Equipment Utilized During Treatment: Rolling walker (2 wheels) Nurse Communication:  (pt ready to go)  Activity Tolerance: Patient tolerated treatment well Patient left:  (sitting on EOB waiting to be discharged)  OT Visit Diagnosis: Other abnormalities of gait and mobility (R26.89);Pain Pain - part of body:  (throat)                Time: 1610-9604 OT Time Calculation (min): 21 min Charges:  OT General Charges $OT Visit: 1 Visit OT Evaluation $OT Eval Moderate Complexity: 1 Mod  Eric L. OT Acute Rehabilitation Services Office [phone]    Evette Georges 05/02/2023, 12:30 PM

## 2023-05-02 NOTE — Plan of Care (Signed)
Pt doing well. Pt and wife given D/C instructions with verbal understanding. Rx was sent to the pharmacy by MD. Pt's incision is clean and dry with no sign of infection. Pt's IV was removed prior to D/C. Pt D/C'd home via wheelchair per MD order. Pt is stable @ D/C and has no other needs at this time. Holli Humbles, RN

## 2023-05-02 NOTE — Discharge Instructions (Signed)

## 2023-05-03 NOTE — Progress Notes (Signed)
Merlin Loop Recorder  

## 2023-05-09 ENCOUNTER — Ambulatory Visit (INDEPENDENT_AMBULATORY_CARE_PROVIDER_SITE_OTHER): Payer: Medicare Other

## 2023-05-09 DIAGNOSIS — G459 Transient cerebral ischemic attack, unspecified: Secondary | ICD-10-CM

## 2023-05-09 LAB — CUP PACEART REMOTE DEVICE CHECK
Date Time Interrogation Session: 20241219080033
Implantable Pulse Generator Implant Date: 20240716
Pulse Gen Serial Number: 511029620

## 2023-05-20 ENCOUNTER — Inpatient Hospital Stay (HOSPITAL_COMMUNITY)
Admission: EM | Admit: 2023-05-20 | Discharge: 2023-05-24 | DRG: 309 | Disposition: A | Discharge status: Home / Self Care | Payer: Medicare Other | Attending: Family Medicine | Admitting: Family Medicine

## 2023-05-20 ENCOUNTER — Other Ambulatory Visit: Payer: Self-pay

## 2023-05-20 ENCOUNTER — Emergency Department (HOSPITAL_COMMUNITY): Payer: Medicare Other

## 2023-05-20 ENCOUNTER — Observation Stay (HOSPITAL_BASED_OUTPATIENT_CLINIC_OR_DEPARTMENT_OTHER): Payer: Medicare Other

## 2023-05-20 ENCOUNTER — Encounter (HOSPITAL_COMMUNITY): Payer: Self-pay | Admitting: Emergency Medicine

## 2023-05-20 DIAGNOSIS — Z7984 Long term (current) use of oral hypoglycemic drugs: Secondary | ICD-10-CM

## 2023-05-20 DIAGNOSIS — I10 Essential (primary) hypertension: Secondary | ICD-10-CM

## 2023-05-20 DIAGNOSIS — E118 Type 2 diabetes mellitus with unspecified complications: Secondary | ICD-10-CM | POA: Diagnosis present

## 2023-05-20 DIAGNOSIS — Z8673 Personal history of transient ischemic attack (TIA), and cerebral infarction without residual deficits: Secondary | ICD-10-CM

## 2023-05-20 DIAGNOSIS — Z7901 Long term (current) use of anticoagulants: Secondary | ICD-10-CM

## 2023-05-20 DIAGNOSIS — Z881 Allergy status to other antibiotic agents status: Secondary | ICD-10-CM

## 2023-05-20 DIAGNOSIS — D509 Iron deficiency anemia, unspecified: Secondary | ICD-10-CM | POA: Diagnosis present

## 2023-05-20 DIAGNOSIS — Z888 Allergy status to other drugs, medicaments and biological substances status: Secondary | ICD-10-CM

## 2023-05-20 DIAGNOSIS — D696 Thrombocytopenia, unspecified: Secondary | ICD-10-CM | POA: Diagnosis not present

## 2023-05-20 DIAGNOSIS — E1122 Type 2 diabetes mellitus with diabetic chronic kidney disease: Secondary | ICD-10-CM | POA: Diagnosis present

## 2023-05-20 DIAGNOSIS — K219 Gastro-esophageal reflux disease without esophagitis: Secondary | ICD-10-CM | POA: Diagnosis present

## 2023-05-20 DIAGNOSIS — N1831 Chronic kidney disease, stage 3a: Secondary | ICD-10-CM | POA: Diagnosis present

## 2023-05-20 DIAGNOSIS — I480 Paroxysmal atrial fibrillation: Secondary | ICD-10-CM | POA: Diagnosis not present

## 2023-05-20 DIAGNOSIS — Z7982 Long term (current) use of aspirin: Secondary | ICD-10-CM

## 2023-05-20 DIAGNOSIS — C9110 Chronic lymphocytic leukemia of B-cell type not having achieved remission: Secondary | ICD-10-CM | POA: Diagnosis not present

## 2023-05-20 DIAGNOSIS — D6869 Other thrombophilia: Secondary | ICD-10-CM | POA: Diagnosis present

## 2023-05-20 DIAGNOSIS — Z87891 Personal history of nicotine dependence: Secondary | ICD-10-CM

## 2023-05-20 DIAGNOSIS — Z801 Family history of malignant neoplasm of trachea, bronchus and lung: Secondary | ICD-10-CM

## 2023-05-20 DIAGNOSIS — Z79899 Other long term (current) drug therapy: Secondary | ICD-10-CM

## 2023-05-20 DIAGNOSIS — Z961 Presence of intraocular lens: Secondary | ICD-10-CM | POA: Diagnosis present

## 2023-05-20 DIAGNOSIS — I4891 Unspecified atrial fibrillation: Principal | ICD-10-CM

## 2023-05-20 DIAGNOSIS — Z8249 Family history of ischemic heart disease and other diseases of the circulatory system: Secondary | ICD-10-CM

## 2023-05-20 DIAGNOSIS — Z7952 Long term (current) use of systemic steroids: Secondary | ICD-10-CM

## 2023-05-20 DIAGNOSIS — D539 Nutritional anemia, unspecified: Secondary | ICD-10-CM | POA: Diagnosis present

## 2023-05-20 DIAGNOSIS — I129 Hypertensive chronic kidney disease with stage 1 through stage 4 chronic kidney disease, or unspecified chronic kidney disease: Secondary | ICD-10-CM | POA: Diagnosis present

## 2023-05-20 DIAGNOSIS — E785 Hyperlipidemia, unspecified: Secondary | ICD-10-CM | POA: Diagnosis present

## 2023-05-20 DIAGNOSIS — I951 Orthostatic hypotension: Secondary | ICD-10-CM | POA: Diagnosis present

## 2023-05-20 DIAGNOSIS — Z833 Family history of diabetes mellitus: Secondary | ICD-10-CM

## 2023-05-20 DIAGNOSIS — I452 Bifascicular block: Secondary | ICD-10-CM | POA: Diagnosis present

## 2023-05-20 LAB — BASIC METABOLIC PANEL
Anion gap: 14 (ref 5–15)
BUN: 16 mg/dL (ref 8–23)
CO2: 20 mmol/L — ABNORMAL LOW (ref 22–32)
Calcium: 9.4 mg/dL (ref 8.9–10.3)
Chloride: 103 mmol/L (ref 98–111)
Creatinine, Ser: 1.4 mg/dL — ABNORMAL HIGH (ref 0.61–1.24)
GFR, Estimated: 51 mL/min — ABNORMAL LOW (ref >60)
Glucose, Bld: 154 mg/dL — ABNORMAL HIGH (ref 70–99)
Potassium: 4.6 mmol/L (ref 3.5–5.1)
Sodium: 137 mmol/L (ref 135–145)

## 2023-05-20 LAB — ECHOCARDIOGRAM COMPLETE
AR max vel: 2.25 cm2
AV Peak grad: 6.1 mm[Hg]
Ao pk vel: 1.23 m/s
Area-P 1/2: 4.57 cm2
Height: 69 in
S' Lateral: 3.15 cm
Weight: 2512 [oz_av]

## 2023-05-20 LAB — CBC
HCT: 29.2 % — ABNORMAL LOW (ref 39.0–52.0)
Hemoglobin: 9.1 g/dL — ABNORMAL LOW (ref 13.0–17.0)
MCH: 31.8 pg (ref 26.0–34.0)
MCHC: 31.2 g/dL (ref 30.0–36.0)
MCV: 102.1 fL — ABNORMAL HIGH (ref 80.0–100.0)
Platelets: 138 10*3/uL — ABNORMAL LOW (ref 150–400)
RBC: 2.86 MIL/uL — ABNORMAL LOW (ref 4.22–5.81)
RDW: 14.8 % (ref 11.5–15.5)
WBC: 82.3 10*3/uL (ref 4.0–10.5)
nRBC: 0 % (ref 0.0–0.2)

## 2023-05-20 LAB — TROPONIN I (HIGH SENSITIVITY)
Troponin I (High Sensitivity): 11 ng/L (ref <18)
Troponin I (High Sensitivity): 49 ng/L — ABNORMAL HIGH (ref <18)

## 2023-05-20 LAB — TSH: TSH: 0.542 u[IU]/mL (ref 0.350–4.500)

## 2023-05-20 LAB — MAGNESIUM: Magnesium: 1.6 mg/dL — ABNORMAL LOW (ref 1.7–2.4)

## 2023-05-20 MED ORDER — MIDODRINE HCL 5 MG PO TABS
10.0000 mg | ORAL_TABLET | Freq: Three times a day (TID) | ORAL | Status: DC
  Administered 2023-05-20 – 2023-05-24 (×11): 10 mg via ORAL
  Filled 2023-05-20 (×11): qty 2

## 2023-05-20 MED ORDER — FLUTICASONE PROPIONATE 50 MCG/ACT NA SUSP: 1.0000 | Freq: Every day | NASAL | Status: DC | PRN

## 2023-05-20 MED ORDER — DOCUSATE SODIUM 100 MG PO CAPS
100.0000 mg | ORAL_CAPSULE | Freq: Every day | ORAL | Status: DC
  Administered 2023-05-21 – 2023-05-24 (×4): 100 mg via ORAL
  Filled 2023-05-20 (×5): qty 1

## 2023-05-20 MED ORDER — SODIUM CHLORIDE 0.9% FLUSH
3.0000 mL | Freq: Two times a day (BID) | INTRAVENOUS | Status: DC
  Administered 2023-05-20 – 2023-05-23 (×6): 3 mL via INTRAVENOUS

## 2023-05-20 MED ORDER — ALBUTEROL SULFATE (2.5 MG/3ML) 0.083% IN NEBU
2.5000 mg | INHALATION_SOLUTION | Freq: Four times a day (QID) | RESPIRATORY_TRACT | Status: DC
  Administered 2023-05-20 – 2023-05-21 (×2): 2.5 mg via RESPIRATORY_TRACT
  Filled 2023-05-20 (×4): qty 3

## 2023-05-20 MED ORDER — METOPROLOL TARTRATE 25 MG PO TABS
25.0000 mg | ORAL_TABLET | Freq: Two times a day (BID) | ORAL | Status: DC
Start: 2023-05-20 — End: 2023-05-23
  Administered 2023-05-20 – 2023-05-22 (×5): 25 mg via ORAL
  Filled 2023-05-20 (×5): qty 1

## 2023-05-20 MED ORDER — MAGNESIUM SULFATE 2 GM/50ML IV SOLN
2.0000 g | Freq: Once | INTRAVENOUS | Status: AC
  Administered 2023-05-20: 2 g via INTRAVENOUS
  Filled 2023-05-20: qty 50

## 2023-05-20 MED ORDER — FLUDROCORTISONE ACETATE 0.1 MG PO TABS
0.1000 mg | ORAL_TABLET | Freq: Every day | ORAL | Status: DC
  Administered 2023-05-20 – 2023-05-24 (×4): 0.1 mg via ORAL
  Filled 2023-05-20 (×6): qty 1

## 2023-05-20 MED ORDER — ARTIFICIAL TEARS OPHTHALMIC OINT: TOPICAL_OINTMENT | Freq: Two times a day (BID) | OPHTHALMIC | Status: DC | PRN

## 2023-05-20 MED ORDER — MIDODRINE HCL 5 MG PO TABS
10.0000 mg | ORAL_TABLET | Freq: Once | ORAL | Status: AC
  Administered 2023-05-20: 10 mg via ORAL
  Filled 2023-05-20: qty 2

## 2023-05-20 MED ORDER — ROSUVASTATIN CALCIUM 5 MG PO TABS
5.0000 mg | ORAL_TABLET | Freq: Every evening | ORAL | Status: DC
  Administered 2023-05-20 – 2023-05-23 (×3): 5 mg via ORAL
  Filled 2023-05-20 (×5): qty 1

## 2023-05-20 MED ORDER — APIXABAN 5 MG PO TABS
5.0000 mg | ORAL_TABLET | Freq: Two times a day (BID) | ORAL | Status: DC
  Administered 2023-05-20 – 2023-05-24 (×9): 5 mg via ORAL
  Filled 2023-05-20 (×9): qty 1

## 2023-05-20 MED ORDER — VITAMIN B-12 100 MCG PO TABS
100.0000 ug | ORAL_TABLET | Freq: Every day | ORAL | Status: DC
  Administered 2023-05-20 – 2023-05-24 (×5): 100 ug via ORAL
  Filled 2023-05-20 (×5): qty 1

## 2023-05-20 MED ORDER — POLYETHYL GLYCOL-PROPYL GLYCOL 0.4-0.3 % OP GEL: Freq: Two times a day (BID) | OPHTHALMIC | Status: DC | PRN

## 2023-05-20 MED ORDER — INSULIN ASPART 100 UNIT/ML IJ SOLN
0.0000 [IU] | Freq: Three times a day (TID) | INTRAMUSCULAR | Status: DC
Start: 2023-05-21 — End: 2023-05-24
  Administered 2023-05-21 (×2): 2 [IU] via SUBCUTANEOUS
  Administered 2023-05-21: 1 [IU] via SUBCUTANEOUS
  Administered 2023-05-22 (×2): 2 [IU] via SUBCUTANEOUS
  Administered 2023-05-22 – 2023-05-23 (×2): 3 [IU] via SUBCUTANEOUS
  Administered 2023-05-23: 1 [IU] via SUBCUTANEOUS
  Administered 2023-05-23: 2 [IU] via SUBCUTANEOUS

## 2023-05-20 MED ORDER — PANTOPRAZOLE SODIUM 40 MG PO TBEC
40.0000 mg | DELAYED_RELEASE_TABLET | Freq: Every day | ORAL | Status: DC
  Administered 2023-05-20 – 2023-05-24 (×5): 40 mg via ORAL
  Filled 2023-05-20 (×6): qty 1

## 2023-05-20 MED ORDER — SODIUM CHLORIDE 0.9 % IV BOLUS
1000.0000 mL | Freq: Once | INTRAVENOUS | Status: AC
  Administered 2023-05-20: 1000 mL via INTRAVENOUS

## 2023-05-20 MED ORDER — HYDROCODONE-ACETAMINOPHEN 5-325 MG PO TABS: 1.0000 | ORAL_TABLET | ORAL | Status: DC | PRN

## 2023-05-20 MED ORDER — ACETAMINOPHEN 650 MG RE SUPP: 650.0000 mg | Freq: Four times a day (QID) | RECTAL | Status: DC | PRN

## 2023-05-20 MED ORDER — ACETAMINOPHEN 325 MG PO TABS: 650.0000 mg | ORAL_TABLET | Freq: Four times a day (QID) | ORAL | Status: DC | PRN

## 2023-05-20 MED ORDER — DILTIAZEM HCL-DEXTROSE 125-5 MG/125ML-% IV SOLN (PREMIX)
5.0000 mg/h | INTRAVENOUS | Status: DC
  Administered 2023-05-20: 5 mg/h via INTRAVENOUS
  Filled 2023-05-20: qty 125

## 2023-05-20 MED ORDER — LORATADINE 10 MG PO TABS
10.0000 mg | ORAL_TABLET | Freq: Every day | ORAL | Status: DC
  Administered 2023-05-22 – 2023-05-24 (×3): 10 mg via ORAL
  Filled 2023-05-20 (×5): qty 1

## 2023-05-20 NOTE — ED Triage Notes (Signed)
Patient presents via EMS from home with afib RVR. Patient states this morning he had sudden-onset sensation of feeling like he was going to pass out. Patient did not lose consciousness. Patient reports history of afib but has been off of his eliquis. Patient is AAOx4 upon arrival speaking in clear, full sentences. Patient received 10mg  IV cardizem en route from EMS.

## 2023-05-20 NOTE — ED Notes (Signed)
Per Evlyn Clines, PA-C, do not titrate up on the diltiazem; hold current rate and administer metoprolol with plan to titrate patient off of diltiazem drip later on.

## 2023-05-20 NOTE — H&P (Signed)
History and Physical    Patient: Eric Harrington MWU:[id number] DOB: [birth year] DOA: 05/20/2023 DOS: the patient was seen and examined on 05/20/2023 PCP: Aliene Beams, MD  Patient coming from: Home via EMS  Chief Complaint:  Chief Complaint  Patient presents with   Tachycardia   HPI: Eric Harrington is a 82 y.o. male with medical history significant of hyperlipidemia, PAF not on anticoagulation, TIA, orthostatic hypotension on midodrine and Florinef, diabetes mellitus type 2, CLL under surveillance, and CKD stage IIIa who presents with  acute episode of chest discomfort. The episode occurred this morning after showering, described as a sudden, bomb-like sensation, causing him to feel like he was about to passing out. This was followed by another similar episode a few minutes later. The discomfort was generalized, non-localizable, and associated with shortness of breath and restlessness.  Noted associated symptoms of frequent belching, but denied any episodes of vomiting.  The patient has a history of atrial fibrillation, previously managed with Eliquis, which was discontinued prior to cervical stenosis surgery on 12/11.  The patient also has a loop recorder implanted for monitoring heart rhythm, which did not alert during the recent episode. The patient has a history of a transient ischemic attack (TIA).  After his procedure he had followed up with Dr. Jacinto Halim and was advised that he did not need to be on Eliquis as he had not had any recurrent episodes of  The patient's other significant medical conditions include diabetes and leukemia. The patient's leukemia is currently not on treatment, with a recent white blood cell count of 83,000. The patient has been informed that treatment may be necessary in the future. The patient is a non-smoker and has not smoked for over forty years.complaints of palpitations.   Patient had just recently underwent underwent two-level anterior cervical decompression and  fusion for treatment of his compressive cervical myelopathy with Dr. Toni Arthurs 12/11.  Patient reports that after the procedure he was told that he did not need to resume Eliquis as his loop recorder has not shown any episodes.    In the emergency department patient was noted to be afebrile with heart rates up to 130s in atrial fibrillation.  Labs significant for WBC 82.3, hemoglobin, platelet 138, TSH 0.542, and magnesium 1.6.  Chest x-ray noted minimal right basilar subsegmental atelectasis.  Patient had been given 1 L normal saline IV fluids, midodrine 10 mg p.o., and started on a Cardizem drip.  Review of Systems: As mentioned in the history of present illness. All other systems reviewed and are negative. Past Medical History:  Diagnosis Date   Arthritis    Chronic kidney disease    CKD3   Chronic Leukemia    Diabetes mellitus without complication (HCC)    Dysrhythmia    A. Fib   GERD (gastroesophageal reflux disease)    History of kidney stones    Hyperlipemia 12/02/2018   Hyperlipidemia    Hypertension    Hx. Now has lower BP issues. On midodrine   Loop recorder: [employer] Assert-IQ 3 Loop recorder. Serial # [id number] 12/04/2022 12/04/2022   Pneumonia    Stroke [facility])    TIA x2 since Dec 2023. No deficits   Type 2 diabetes mellitus with complication, without long-term current use of insulin (HCC) 12/02/2018   Past Surgical History:  Procedure Laterality Date   ANTERIOR CERVICAL DECOMP/DISCECTOMY FUSION N/A 05/01/2023   Procedure: Anterior Cervical Decompression/ Discectomy Fusion Cervical Four-Cervical Five - Cervical Five-Cervical Six;  Surgeon: Julio Sicks, MD;  Location: [facility] OR;  Service: Neurosurgery;  Laterality: N/A;  3C   CARDIAC CATHETERIZATION  2017   CATARACT EXTRACTION W/ INTRAOCULAR LENS IMPLANT Bilateral    COLONOSCOPY     VENTRAL HERNIA REPAIR     Social History:  reports that he quit smoking about 45 years ago. His smoking use included cigarettes. He started smoking  about 75 years ago. He has a 30 pack-year smoking history. He has never used smokeless tobacco. He reports that he does not currently use drugs. He reports that he does not drink alcohol.  Allergies  Allergen Reactions   Norvasc [Amlodipine Besylate] Rash    Fatigue  Fever  Chills    Augmentin [Amoxicillin-Pot Clavulanate] Other (See Comments)    Stomach upset   Bystolic [Nebivolol Hcl] Other (See Comments)    Weakness    Coq10 [Coenzyme Q10] Other (See Comments)    Unknown reaction   Cozaar [Losartan] Other (See Comments)    Unknown reaction   Farxiga [Dapagliflozin] Other (See Comments)    Excessive urination Dehydration   Glucotrol [Glipizide] Other (See Comments)    Unknown reaction   Januvia [Sitagliptin] Other (See Comments)    Unknown reaction   Keflex [Cephalexin] Other (See Comments)    Unknown reaction   Lipitor [Atorvastatin Calcium]    Lipitor [Atorvastatin] Other (See Comments)    Unknown reaction   Lopid [Gemfibrozil] Other (See Comments)    Unknown reaction   Omnicef [Cefdinir] Other (See Comments)    Unknown reaction   Prilosec [Omeprazole] Other (See Comments)    Unknown reaction   Toprol Xl [Metoprolol] Other (See Comments)    Weakness    Vibra-Tab [Doxycycline] Other (See Comments)    Fever  Chills   Zestril [Lisinopril] Other (See Comments)    Constipation     Zetia [Ezetimibe] Other (See Comments)    Unknown reaction   Zocor [Simvastatin] Other (See Comments)    Unknown reaction   Neurontin [Gabapentin] Other (See Comments)    Unknown reaction    Family History  Problem Relation Age of Onset   Lung cancer Mother    Heart attack Father    Dementia Sister    Diabetes Brother    Diabetes Sister    Heart attack Brother     Prior to Admission medications   Medication Sig Start Date End Date Taking? Authorizing Provider  acetaminophen (TYLENOL) 500 MG tablet Take 1,000 mg by mouth every 8 (eight) hours as needed for moderate pain, fever or  headache.   Yes [provider]  ASCORBIC ACID PO Take 1 tablet by mouth daily. Vitamin C.   Yes [provider]  aspirin (ASPIRIN CHILDRENS) 81 MG chewable tablet Chew 1 tablet (81 mg total) by mouth daily. 04/15/23  Yes Yates Decamp, MD  cetirizine (ZYRTEC) 10 MG tablet Take 10 mg by mouth daily as needed for allergies.   Yes [provider]  Cholecalciferol (VITAMIN D-3 PO) Take 1 capsule by mouth daily.   Yes [provider]  Cyanocobalamin (VITAMIN B12) 1000 MCG TBCR Take 1 tablet by mouth daily.   Yes [provider]  docusate sodium (COLACE) 100 MG capsule Take 100 mg by mouth daily.    Yes [provider]  esomeprazole (NEXIUM 24HR) 20 MG capsule Take 20 mg by mouth daily at 12 noon.   Yes [provider]  fexofenadine (ALLEGRA) 180 MG tablet Take 180 mg by mouth daily.   Yes [provider]  FIBER PO Take  3 tablets by mouth daily.   Yes [provider]  fludrocortisone (FLORINEF) 0.1 MG tablet Take 0.1 mg by mouth daily. 10/08/22  Yes [provider]  fluticasone (FLONASE) 50 MCG/ACT nasal spray Place 1 spray into both nostrils daily as needed for allergies.   Yes [provider]  glimepiride (AMARYL) 2 MG tablet Take 2 mg by mouth daily with breakfast. 11/14/20  Yes [provider]  HYDROcodone-acetaminophen (NORCO/VICODIN) 5-325 MG tablet Take 1 tablet by mouth every 4 (four) hours as needed for moderate pain (pain score 4-6) ((score 4 to 6)). 05/02/23  Yes Pool, Sherilyn Cooter, MD  metFORMIN (GLUCOPHAGE) 500 MG tablet Take 500 mg by mouth 2 (two) times daily with a meal.   Yes [provider]  midodrine (PROAMATINE) 10 MG tablet Take 1 tablet (10 mg total) by mouth 3 (three) times daily with meals. While awake. Do not lay down for 3-4 hours after taking 07/31/22  Yes Yates Decamp, MD  Multiple Vitamins-Minerals (ZINC PO) Take 1 tablet by mouth daily.   Yes [provider]   Polyethyl Glycol-Propyl Glycol (SYSTANE OP) Place 1 drop into both eyes 2 (two) times daily as needed (dryness, irritation.).   Yes [provider]  Pyridoxine HCl (VITAMIN B-6 PO) Take 1 tablet by mouth daily.   Yes [provider]  rosuvastatin (CRESTOR) 5 MG tablet Take 5 mg by mouth every evening.   Yes [provider]  albuterol (VENTOLIN HFA) 108 (90 Base) MCG/ACT inhaler Inhale 1 puff into the lungs every 4 (four) hours as needed for wheezing or shortness of breath. Patient not taking: Reported on 05/20/2023    [provider]    Physical Exam: Vitals:   05/20/23 1108 05/20/23 1230 05/20/23 1245 05/20/23 1325  BP:  131/68 121/69 131/72  Pulse:  (!) 137 (!) 116 99  Resp:  19 12 12   Temp:      TempSrc:      SpO2:  100% 100% 100%  Weight: 71.2 kg     Height: 5\' 9"  (1.753 m)      Exam  Constitutional: Elderly male in no acute distress at this time Eyes: PERRL, lids and conjunctivae normal ENMT: Mucous membranes are moist. Posterior pharynx clear of any exudate or lesions.  Neck: normal, supple, no masses, no JVD present Respiratory: clear to auscultation bilaterally, no wheezing, no crackles. Normal respiratory effort. No accessory muscle use.  Cardiovascular: Irregular irregular no extremity edema. 2+ pedal pulses. No carotid bruits.  Abdomen: no tenderness, no masses palpated.   Bowel sounds positive.  Musculoskeletal: no clubbing / cyanosis. No joint deformity upper and lower extremities. Good ROM, no contractures. Normal muscle tone.  Skin: no rashes, lesions, ulcers. No induration Neurologic: CN 2-12 grossly intact. Sensation intact, DTR normal. Strength 5/5 in all 4.  Psychiatric: Normal judgment and insight. Alert and oriented x 3. Normal mood.   Data Reviewed:  EKG revealed atrial fibrillation at 133 bpm.  Reviewed labs, imaging, and pertinent records as documented.  Assessment and Plan:  Paroxysmal atrial fibrillation Patient  presents with complaints of palpitations.  Found to be in A-fib with RVR with heart rates into the 130s.  Patient had been started on a Cardizem drip.  CHA2DS2-VASc score equal to 5.    -Admit to a progressive bed -Goal potassium at least 4 and magnesium at least 2.  Replace electrolytes as needed -Restart Eliquis -Cardiology started metoprolol 25 mg twice daily.  Addendum: Cardizem discontinued after heart rates noted  to drop into the 40s 6:45 PM 12/30 -Cardiology consulted,  will follow-up for any further recommendations  CLL Leukocytosis Macrocytic anemia Thrombocytopenia Patient is followed by Dr. Candise Che  of oncology in the outpatient setting.  White blood cell count is elevated, but lower than prior.  Hemoglobin appears relatively stable.  Platelet counts appear improved from last check. -Continue to monitor  Controlled diabetes mellitus type 2, without current use of insulin Last hemoglobin A1c was 6.9 on 12/3. -Hypoglycemic protocols -Hold Amaryl and metformin -CBGs before every meal with sensitive SSI  Hypomagnesemia Acute.  Magnesium noted to be 1.6. -Give 2 g of magnesium sulfate IV x 1 dose  Orthostatic hypotension Chronic.  Blood pressures are currently maintained. -Continue midodrine and Florinef  Hyperlipidemia -Continue Crestor  GERD -Continue pharmacy substitution of Protonix DVT prophylaxis: Eliquis  Advance Care Planning:   Code Status: Full Code    Consults: Cardiology  Family Communication: Grandson updated at bedside  Severity of Illness: The appropriate patient status for this patient is OBSERVATION. Observation status is judged to be reasonable and necessary in order to provide the required intensity of service to ensure the patient's safety. The patient's presenting symptoms, physical exam findings, and initial radiographic and laboratory data in the context of their medical condition is felt to place them at decreased risk for further clinical  deterioration. Furthermore, it is anticipated that the patient will be medically stable for discharge from the hospital within 2 midnights of admission.   Author: Clydie Braun, MD 05/20/2023 1:41 PM  For on call review [email or web address].

## 2023-05-20 NOTE — ED Notes (Signed)
ED TO INPATIENT HANDOFF REPORT  ED Nurse Name and Phone #: Donnal Debar [phone]  S Name/Age/Gender Eric Harrington 82 y.o. male Room/Bed: 040C/040C  Code Status   Code Status: Full Code  Home/SNF/Other Home Patient oriented to: self, place, time, and situation Is this baseline? Yes   Triage Complete: Triage complete  Chief Complaint Paroxysmal atrial fibrillation [facility]) [I48.0]  Triage Note Patient presents via EMS from home with afib RVR. Patient states this morning he had sudden-onset sensation of feeling like he was going to pass out. Patient did not lose consciousness. Patient reports history of afib but has been off of his eliquis. Patient is AAOx4 upon arrival speaking in clear, full sentences. Patient received 10mg  IV cardizem en route from EMS.   Allergies Allergies  Allergen Reactions   Norvasc [Amlodipine Besylate] Rash    Fatigue  Fever  Chills    Augmentin [Amoxicillin-Pot Clavulanate] Other (See Comments)    Stomach upset   Bystolic [Nebivolol Hcl] Other (See Comments)    Weakness    Coq10 [Coenzyme Q10] Other (See Comments)    Unknown reaction   Cozaar [Losartan] Other (See Comments)    Unknown reaction   Farxiga [Dapagliflozin] Other (See Comments)    Excessive urination Dehydration   Glucotrol [Glipizide] Other (See Comments)    Unknown reaction   Januvia [Sitagliptin] Other (See Comments)    Unknown reaction   Keflex [Cephalexin] Other (See Comments)    Unknown reaction   Lipitor [Atorvastatin Calcium]    Lipitor [Atorvastatin] Other (See Comments)    Unknown reaction   Lopid [Gemfibrozil] Other (See Comments)    Unknown reaction   Omnicef [Cefdinir] Other (See Comments)    Unknown reaction   Prilosec [Omeprazole] Other (See Comments)    Unknown reaction   Toprol Xl [Metoprolol] Other (See Comments)    Weakness    Vibra-Tab [Doxycycline] Other (See Comments)    Fever  Chills   Zestril [Lisinopril] Other (See Comments)    Constipation      Zetia [Ezetimibe] Other (See Comments)    Unknown reaction   Zocor [Simvastatin] Other (See Comments)    Unknown reaction   Neurontin [Gabapentin] Other (See Comments)    Unknown reaction    Level of Care/Admitting Diagnosis ED Disposition     ED Disposition  Admit   Condition  --   Comment  Hospital Area: [facility] [[id number]]  Level of Care: Progressive [102]  Admit to Progressive based on following criteria: CARDIOVASCULAR & THORACIC of moderate stability with acute coronary syndrome symptoms/low risk myocardial infarction/hypertensive urgency/arrhythmias/heart failure potentially compromising stability and stable post cardiovascular intervention patients.  May place patient in observation at [facility] or [facility] if equivalent level of care is available:: No  Covid Evaluation: Asymptomatic - no recent exposure (last 10 days) testing not required  Diagnosis: Paroxysmal atrial fibrillation [facility]) [[id number]]  Admitting Physician: Clydie Braun [[id number]]  Attending Physician: Clydie Braun [[id number]]          B Medical/Surgery History Past Medical History:  Diagnosis Date   Arthritis    Chronic kidney disease    CKD3   Chronic Leukemia    Diabetes mellitus without complication (HCC)    Dysrhythmia    A. Fib   GERD (gastroesophageal reflux disease)    History of kidney stones    Hyperlipemia 12/02/2018   Hyperlipidemia    Hypertension    Hx. Now has lower BP issues. On midodrine   Loop recorder:  [employer] Assert-IQ 3 Loop recorder. Serial # [id number] 12/04/2022 12/04/2022   Pneumonia    Stroke [facility])    TIA x2 since Dec 2023. No deficits   Type 2 diabetes mellitus with complication, without long-term current use of insulin (HCC) 12/02/2018   Past Surgical History:  Procedure Laterality Date   ANTERIOR CERVICAL DECOMP/DISCECTOMY FUSION N/A 05/01/2023   Procedure: Anterior Cervical Decompression/ Discectomy Fusion Cervical Four-Cervical Five -  Cervical Five-Cervical Six;  Surgeon: Julio Sicks, MD;  Location[facility] OR;  Service: Neurosurgery;  Laterality: N/A;  3C   CARDIAC CATHETERIZATION  2017   CATARACT EXTRACTION W/ INTRAOCULAR LENS IMPLANT Bilateral    COLONOSCOPY     VENTRAL HERNIA REPAIR       A IV Location/Drains/Wounds Patient Lines/Drains/Airways Status     Active Line/Drains/Airways     Name Placement date Placement time Site Days   Peripheral IV 05/20/23 18 G Left;Posterior Hand 05/20/23  --  Hand  less than 1   Peripheral IV 05/20/23 18 G Anterior;Right Forearm 05/20/23  1118  Forearm  less than 1            Intake/Output Last 24 hours  Intake/Output Summary (Last 24 hours) at 05/20/2023 2000 Last data filed at 05/20/2023 1907 Gross per 24 hour  Intake 1050.56 ml  Output --  Net 1050.56 ml    Labs/Imaging Results for orders placed or performed during the hospital encounter of 05/20/23 (from the past 48 hours)  Basic metabolic panel     Status: Abnormal   Collection Time: 05/20/23 11:19 AM  Result Value Ref Range   Sodium 137 135 - 145 mmol/L   Potassium 4.6 3.5 - 5.1 mmol/L   Chloride 103 98 - 111 mmol/L   CO2 20 (L) 22 - 32 mmol/L   Glucose, Bld 154 (H) 70 - 99 mg/dL    Comment: Glucose reference range applies only to samples taken after fasting for at least 8 hours.   BUN 16 8 - 23 mg/dL   Creatinine, Ser 8.29 (H) 0.61 - 1.24 mg/dL   Calcium 9.4 8.9 - 56.2 mg/dL   GFR, Estimated 51 (L) >60 mL/min    Comment: (NOTE) Calculated using the CKD-EPI Creatinine Equation (2021)    Anion gap 14 5 - 15    Comment: Performed at [facility], [address] [place], [place] [address]  Magnesium     Status: Abnormal   Collection Time: 05/20/23 11:19 AM  Result Value Ref Range   Magnesium 1.6 (L) 1.7 - 2.4 mg/dL    Comment: Performed at [facility], [address], [place], [place] [address]  CBC     Status: Abnormal   Collection Time: 05/20/23 11:19 AM  Result Value Ref Range   WBC  82.3 (HH) 4.0 - 10.5 K/uL    Comment: REPEATED TO VERIFY THIS CRITICAL RESULT HAS VERIFIED AND BEEN CALLED TO MADELYNN MYERS, RN BY ABEER ALTOM ON 12 30 2024 AT 1244, AND HAS BEEN READ BACK.     RBC 2.86 (L) 4.22 - 5.81 MIL/uL   Hemoglobin 9.1 (L) 13.0 - 17.0 g/dL   HCT 96.2 (L) 95.2 - 84.1 %   MCV 102.1 (H) 80.0 - 100.0 fL   MCH 31.8 26.0 - 34.0 pg   MCHC 31.2 30.0 - 36.0 g/dL   RDW 32.4 40.1 - 02.7 %   Platelets 138 (L) 150 - 400 K/uL   nRBC 0.0 0.0 - 0.2 %    Comment: Performed  at [facility], [address], [place], [place] [address]  TSH     Status: None   Collection Time: 05/20/23 11:19 AM  Result Value Ref Range   TSH 0.542 0.350 - 4.500 uIU/mL    Comment: Performed by a 3rd Generation assay with a functional sensitivity of <=0.01 uIU/mL. Performed at [facility], [address] [place], [place] [address]   Troponin I (High Sensitivity)     Status: None   Collection Time: 05/20/23 11:19 AM  Result Value Ref Range   Troponin I (High Sensitivity) 11 <18 ng/L    Comment: (NOTE) Elevated high sensitivity troponin I (hsTnI) values and significant  changes across serial measurements may suggest ACS but many other  chronic and acute conditions are known to elevate hsTnI results.  Refer to the "Links" section for chest pain algorithms and additional  guidance. Performed at [facility], [address], [place], [place] [address]   Troponin I (High Sensitivity)     Status: Abnormal   Collection Time: 05/20/23  1:25 PM  Result Value Ref Range   Troponin I (High Sensitivity) 49 (H) <18 ng/L    Comment: DELTA CHECK NOTED RESULT CALLED TO, READ BACK BY AND VERIFIED WITH Aurther Loft RN @ [phone] 05/10/2023 S.BYRD (NOTE) Elevated high sensitivity troponin I (hsTnI) values and significant  changes across serial measurements may suggest ACS but many other  chronic and acute conditions are known to elevate hsTnI results.  Refer to the "Links" section for chest pain  algorithms and additional  guidance. Performed at [facility], [address], [place], [place] [address]    ECHOCARDIOGRAM COMPLETE Result Date: 05/20/2023    ECHOCARDIOGRAM REPORT   Patient Name:   ALLISON CORUM Date of Exam: 05/20/2023 Medical Rec #:  [id number]     Height:       69.0 in Accession #:    [id number]    Weight:       157.0 lb Date of Birth:  [birth year]     BSA:          1.864 m Patient Age:    82 years      BP:           131/72 mmHg Patient Gender: M             HR:           87 bpm. Exam Location:  Inpatient Procedure: 2D Echo, Cardiac Doppler and Color Doppler Indications:    Atrial Fibrillation I48.91  History:        Patient has prior history of Echocardiogram examinations, most                 recent 10/26/2022. Stroke and CKD, stage 3, Arrythmias:Atrial                 Fibrillation, Signs/Symptoms:Hypotension; Risk                 Factors:Hypertension, Diabetes and Dyslipidemia.  Sonographer:    Lucendia Herrlich RCS Referring Phys: [phone] RONDELL A SMITH IMPRESSIONS  1. Left ventricular ejection fraction, by estimation, is 55 to 60%. The left ventricle has normal function. The left ventricle has no regional wall motion abnormalities. Left ventricular diastolic parameters are indeterminate.  2. Right ventricular systolic function is normal. The right ventricular size is normal. There is normal pulmonary artery systolic pressure.  3. The mitral valve is degenerative. Trivial mitral valve regurgitation. No  evidence of mitral stenosis.  4. The aortic valve is tricuspid. Aortic valve regurgitation is trivial. No aortic stenosis is present.  5. The inferior vena cava is dilated in size with >50% respiratory variability, suggesting right atrial pressure of 8 mmHg. FINDINGS  Left Ventricle: Left ventricular ejection fraction, by estimation, is 55 to 60%. The left ventricle has normal function. The left ventricle has no regional wall motion abnormalities. The left ventricular internal  cavity size was normal in size. There is  no left ventricular hypertrophy. Left ventricular diastolic parameters are indeterminate. Indeterminate filling pressures. Right Ventricle: The right ventricular size is normal. No increase in right ventricular wall thickness. Right ventricular systolic function is normal. There is normal pulmonary artery systolic pressure. The tricuspid regurgitant velocity is 2.43 m/s, and  with an assumed right atrial pressure of 8 mmHg, the estimated right ventricular systolic pressure is 31.6 mmHg. Left Atrium: Left atrial size was normal in size. Right Atrium: Right atrial size was normal in size. Pericardium: There is no evidence of pericardial effusion. Mitral Valve: The mitral valve is degenerative in appearance. There is mild thickening of the mitral valve leaflet(s). Trivial mitral valve regurgitation. No evidence of mitral valve stenosis. Tricuspid Valve: The tricuspid valve is normal in structure. Tricuspid valve regurgitation is trivial. No evidence of tricuspid stenosis. Aortic Valve: The aortic valve is tricuspid. Aortic valve regurgitation is trivial. No aortic stenosis is present. Aortic valve peak gradient measures 6.1 mmHg. Pulmonic Valve: The pulmonic valve was normal in structure. Pulmonic valve regurgitation is not visualized. No evidence of pulmonic stenosis. Aorta: The aortic root is normal in size and structure. Venous: The inferior vena cava is dilated in size with greater than 50% respiratory variability, suggesting right atrial pressure of 8 mmHg. IAS/Shunts: No atrial level shunt detected by color flow Doppler.  LEFT VENTRICLE PLAX 2D LVIDd:         4.40 cm   Diastology LVIDs:         3.15 cm   LV e' medial:    9.32 cm/s LV PW:         1.10 cm   LV E/e' medial:  13.0 LV IVS:        0.90 cm   LV e' lateral:   9.32 cm/s LVOT diam:     2.10 cm   LV E/e' lateral: 13.0 LV SV:         48 LV SV Index:   26 LVOT Area:     3.46 cm  RIGHT VENTRICLE            IVC RV S  prime:     8.70 cm/s  IVC diam: 2.10 cm TAPSE (M-mode): 1.7 cm LEFT ATRIUM             Index        RIGHT ATRIUM           Index LA diam:        3.80 cm 2.04 cm/m   RA Area:     11.00 cm LA Vol (A2C):   55.3 ml 29.67 ml/m  RA Volume:   17.50 ml  9.39 ml/m LA Vol (A4C):   43.1 ml 23.12 ml/m LA Biplane Vol: 51.8 ml 27.79 ml/m  AORTIC VALVE AV Area (Vmax): 2.25 cm AV Vmax:        123.00 cm/s AV Peak Grad:   6.1 mmHg LVOT Vmax:      79.87 cm/s LVOT Vmean:     54.200 cm/s  LVOT VTI:       0.139 m  AORTA Ao Root diam: 3.70 cm Ao Asc diam:  3.30 cm MITRAL VALVE                TRICUSPID VALVE MV Area (PHT): 4.57 cm     TR Peak grad:   23.6 mmHg MV Decel Time: 166 msec     TR Vmax:        243.00 cm/s MV E velocity: 121.00 cm/s                             SHUNTS                             Systemic VTI:  0.14 m                             Systemic Diam: 2.10 cm Chilton Si MD Electronically signed by Chilton Si MD Signature Date/Time: 05/20/2023/6:30:10 PM    Final    DG Chest Port 1 View Result Date: 05/20/2023 CLINICAL DATA:  Atrial fibrillation. EXAM: PORTABLE CHEST 1 VIEW COMPARISON:  October 25, 2022. FINDINGS: Stable cardiomediastinal silhouette. Left lung is clear. Minimal right basilar subsegmental atelectasis is noted. Bony thorax is unremarkable. IMPRESSION: Minimal right basilar subsegmental atelectasis. Electronically Signed   By: Lupita Raider M.D.   On: 05/20/2023 12:31    Pending Labs Unresulted Labs (From admission, onward)     Start     Ordered   05/21/23 0500  CBC  Tomorrow morning,   R        05/20/23 1402   05/21/23 0500  Basic metabolic panel  Tomorrow morning,   R        05/20/23 1402            Vitals/Pain Today's Vitals   05/20/23 1830 05/20/23 1850 05/20/23 1858 05/20/23 1930  BP: 119/69 92/71  115/61  Pulse: (!) 40 95  90  Resp: 20 (!) 21  16  Temp:   97.6 F (36.4 C) 98 F (36.7 C)  TempSrc:   Oral Oral  SpO2: 100% 100%  99%  Weight:      Height:       PainSc:        Isolation Precautions No active isolations  Medications Medications  HYDROcodone-acetaminophen (NORCO/VICODIN) 5-325 MG per tablet 1 tablet (has no administration in time range)  midodrine (PROAMATINE) tablet 10 mg (10 mg Oral Given 05/20/23 1646)  fludrocortisone (FLORINEF) tablet 0.1 mg (0.1 mg Oral Given 05/20/23 1536)  docusate sodium (COLACE) capsule 100 mg (100 mg Oral Patient Refused/Not Given 05/20/23 1536)  pantoprazole (PROTONIX) EC tablet 40 mg (40 mg Oral Given 05/20/23 1536)  loratadine (CLARITIN) tablet 10 mg (10 mg Oral Patient Refused/Not Given 05/20/23 1536)  fluticasone (FLONASE) 50 MCG/ACT nasal spray 1 spray (has no administration in time range)  vitamin B-12 (CYANOCOBALAMIN) tablet 100 mcg (100 mcg Oral Given 05/20/23 1536)  apixaban (ELIQUIS) tablet 5 mg (5 mg Oral Given 05/20/23 1536)  sodium chloride flush (NS) 0.9 % injection 3 mL (3 mLs Intravenous Not Given 05/20/23 1544)  acetaminophen (TYLENOL) tablet 650 mg (has no administration in time range)    Or  acetaminophen (TYLENOL) suppository 650 mg (has no administration in time range)  albuterol (PROVENTIL) (2.5 MG/3ML) 0.083% nebulizer solution 2.5 mg (2.5 mg  Nebulization Not Given 05/20/23 1959)  artificial tears (LACRILUBE) ophthalmic ointment (has no administration in time range)  metoprolol tartrate (LOPRESSOR) tablet 25 mg (25 mg Oral Given 05/20/23 1646)  insulin aspart (novoLOG) injection 0-9 Units (has no administration in time range)  rosuvastatin (CRESTOR) tablet 5 mg (5 mg Oral Given 05/20/23 1959)  sodium chloride 0.9 % bolus 1,000 mL (0 mLs Intravenous Stopped 05/20/23 1300)  midodrine (PROAMATINE) tablet 10 mg (10 mg Oral Given 05/20/23 1143)  magnesium sulfate IVPB 2 g 50 mL (0 g Intravenous Stopped 05/20/23 1641)    Mobility walks with device     Focused Assessments     R Recommendations: See Admitting Provider Note  Report given to:   Additional Notes: D/c  diltiazam due to HR dropping to 40s

## 2023-05-20 NOTE — Progress Notes (Signed)
Echocardiogram 2D Echocardiogram has been performed.  Eric Harrington 05/20/2023, 3:36 PM

## 2023-05-20 NOTE — ED Provider Notes (Signed)
[place] EMERGENCY DEPARTMENT AT [facility] Provider Note   CSN: [id number] Arrival date & time: 05/20/23  1044     History  Chief Complaint  Patient presents with   Tachycardia    Eric Harrington is a 82 y.o. male.  Patient with longstanding history of diabetes mellitus with stage 3 CKD, CLL, hyperlipidemia, hypertension, palpitations and near syncopal spells due to severe orthostatic hypotension, improved with midodrine, paroxysmal A-fib previously on anticoagulation but taken off and November 20 6:24 months of no A-fib on loop recorder, prior to cervical spine surgery in December --presents to the emergency department today for evaluation of lightheadedness and shortness of breath.  Symptoms started after he took a shower today.  Patient states that he was sitting in shaving when he had 2 waves of feeling like he was going to pass out.  EMS was called and per their report, patient was found to be in atrial fibrillation with RVR, rate around 150, with associated tachypnea into the 40s.  They did administer 10 mg of Cardizem which temporarily lowered heart rate, however patient became hypotensive into the 90s.  He was given 500 cc of normal saline.  Most recent blood pressure low 100s.  Heart rate remains in the 140s.  Patient denies chest pain.  He feels short of breath.  No lower extremity swelling.  He did take his midodrine this morning late a.m.  Follows with Dr. Jacinto Halim.       Home Medications Prior to Admission medications   Medication Sig Start Date End Date Taking? Authorizing Provider  acetaminophen (TYLENOL) 500 MG tablet Take 1,000 mg by mouth every 8 (eight) hours as needed for moderate pain, fever or headache.    [provider]  albuterol (VENTOLIN HFA) 108 (90 Base) MCG/ACT inhaler Inhale 1 puff into the lungs every 4 (four) hours as needed for wheezing or shortness of breath.    [provider]  ASCORBIC ACID PO Take 1 tablet by mouth daily.  Vitamin C.    [provider]  aspirin (ASPIRIN CHILDRENS) 81 MG chewable tablet Chew 1 tablet (81 mg total) by mouth daily. 04/15/23   Yates Decamp, MD  cetirizine (ZYRTEC) 10 MG tablet Take 10 mg by mouth daily as needed for allergies.    [provider]  Cholecalciferol (VITAMIN D-3 PO) Take 1 capsule by mouth daily.    [provider]  docusate sodium (COLACE) 100 MG capsule Take 100 mg by mouth daily.     [provider]  esomeprazole (NEXIUM 24HR) 20 MG capsule Take 20 mg by mouth daily at 12 noon.    [provider]  FIBER PO Take 3 tablets by mouth daily.    [provider]  fluticasone (FLONASE) 50 MCG/ACT nasal spray Place 1 spray into both nostrils daily as needed for allergies.    [provider]  glimepiride (AMARYL) 2 MG tablet Take 2 mg by mouth daily with breakfast. 11/14/20   [provider]  HYDROcodone-acetaminophen (NORCO/VICODIN) 5-325 MG tablet Take 1 tablet by mouth every 4 (four) hours as needed for moderate pain (pain score 4-6) ((score 4 to 6)). 05/02/23   Julio Sicks, MD  metFORMIN (GLUCOPHAGE) 1000 MG tablet Take 0.5 tablets (500 mg total) by mouth 2 (two) times daily. MAY RESUME ON 10/29/22 Patient not taking: Reported on 04/17/2023 10/29/22   Osvaldo Shipper, MD  metFORMIN (GLUCOPHAGE) 500 MG tablet Take 500 mg by mouth 2 (two) times daily with a meal.  [provider]  midodrine (PROAMATINE) 10 MG tablet Take 1 tablet (10 mg total) by mouth 3 (three) times daily with meals. While awake. Do not lay down for 3-4 hours after taking 07/31/22   Yates Decamp, MD  Multiple Vitamins-Minerals (ZINC PO) Take 1 tablet by mouth daily.    [provider]  Polyethyl Glycol-Propyl Glycol (SYSTANE OP) Place 1 drop into both eyes 2 (two) times daily as needed (dryness, irritation.).    [provider]  Pyridoxine HCl (VITAMIN B-6 PO) Take 1 tablet by mouth daily.    [provider]   rosuvastatin (CRESTOR) 5 MG tablet Take 5 mg by mouth every evening.    [provider]      Allergies    Norvasc [amlodipine besylate], Augmentin [amoxicillin-pot clavulanate], Bystolic [nebivolol hcl], Coq10 [coenzyme q10], Cozaar [losartan], Farxiga [dapagliflozin], Glucotrol [glipizide], Januvia [sitagliptin], Keflex [cephalexin], Lipitor [atorvastatin calcium], Lipitor [atorvastatin], Lopid [gemfibrozil], Omnicef [cefdinir], Prilosec [omeprazole], Toprol xl [metoprolol], Vibra-tab [doxycycline], Zestril [lisinopril], Zetia [ezetimibe], Zocor [simvastatin], and Neurontin [gabapentin]    Review of Systems   Review of Systems  Physical Exam Updated Vital Signs BP 113/70 (BP Location: Right Arm)   Pulse (!) 134   Temp (!) 97.5 F (36.4 C) (Oral)   Resp (!) 22   Ht 5\' 9"  (1.753 m)   Wt 71.2 kg   SpO2 100%   BMI 23.18 kg/m   Physical Exam Vitals and nursing note reviewed.  Constitutional:      General: He is not in acute distress.    Appearance: He is well-developed.  HENT:     Head: Normocephalic and atraumatic.  Eyes:     General:        Right eye: No discharge.        Left eye: No discharge.     Conjunctiva/sclera: Conjunctivae normal.  Cardiovascular:     Rate and Rhythm: Tachycardia present. Rhythm regularly irregular.     Heart sounds: Normal heart sounds.  Pulmonary:     Effort: Pulmonary effort is normal.     Breath sounds: Normal breath sounds.  Abdominal:     Palpations: Abdomen is soft.     Tenderness: There is no abdominal tenderness.  Musculoskeletal:     Cervical back: Normal range of motion and neck supple.  Skin:    General: Skin is warm and dry.  Neurological:     Mental Status: He is alert.     ED Results / Procedures / Treatments   Labs (all labs ordered are listed, but only abnormal results are displayed) Labs Reviewed  BASIC METABOLIC PANEL - Abnormal; Notable for the following components:      Result Value   CO2 20 (*)     Glucose, Bld 154 (*)    Creatinine, Ser 1.40 (*)    GFR, Estimated 51 (*)    All other components within normal limits  MAGNESIUM - Abnormal; Notable for the following components:   Magnesium 1.6 (*)    All other components within normal limits  CBC - Abnormal; Notable for the following components:   WBC 82.3 (*)    RBC 2.86 (*)    Hemoglobin 9.1 (*)    HCT 29.2 (*)    MCV 102.1 (*)    Platelets 138 (*)    All other components within normal limits  TSH  TROPONIN I (HIGH SENSITIVITY)  TROPONIN I (HIGH SENSITIVITY)    ED ECG REPORT   Date: 05/20/2023  Rate: 133  Rhythm: atrial  fibrillation  QRS Axis: left  Intervals: normal  ST/T Wave abnormalities: nonspecific ST/T changes  Conduction Disutrbances:right bundle branch block and left anterior fascicular block  Narrative Interpretation:   Old EKG Reviewed: changes noted  I have personally reviewed the EKG tracing and agree with the computerized printout as noted.   Radiology DG Chest Port 1 View Result Date: 05/20/2023 CLINICAL DATA:  Atrial fibrillation. EXAM: PORTABLE CHEST 1 VIEW COMPARISON:  October 25, 2022. FINDINGS: Stable cardiomediastinal silhouette. Left lung is clear. Minimal right basilar subsegmental atelectasis is noted. Bony thorax is unremarkable. IMPRESSION: Minimal right basilar subsegmental atelectasis. Electronically Signed   By: Lupita Raider M.D.   On: 05/20/2023 12:31    Procedures Procedures    Medications Ordered in ED Medications  diltiazem (CARDIZEM) 125 mg in dextrose 5% 125 mL (1 mg/mL) infusion (10 mg/hr Intravenous Rate/Dose Change 05/20/23 1231)  sodium chloride 0.9 % bolus 1,000 mL (1,000 mLs Intravenous New Bag/Given 05/20/23 1128)  midodrine (PROAMATINE) tablet 10 mg (10 mg Oral Given 05/20/23 1143)    ED Course/ Medical Decision Making/ A&P    Patient seen and examined. History obtained directly from patient.  Reviewed outpatient cardiology notes and recent surgery notes.  Patient  discussed with and seen by Dr. Maple Hudson at bedside.  Labs/EKG: Ordered CBC, CMP, troponin, TSH.  Imaging: Ordered chest x-ray.  Medications/Fluids: Ordered: IV fluid bolus, IV Cardizem drip, no bolus..   Most recent vital signs reviewed and are as follows: BP 131/68 (BP Location: Right Arm)   Pulse (!) 137   Temp (!) 97.5 F (36.4 C) (Oral)   Resp 19   Ht 5\' 9"  (1.753 m)   Wt 71.2 kg   SpO2 100%   BMI 23.18 kg/m   Initial impression: Atrial fibrillation with RVR, will need to be very careful in regards to hypotension.  Will give fluid bolus, p.o. dose of midodrine, titrate Cardizem slowly.  Labs pending.  Will need to touch base with cardiology.  12:38 PM Reassessment performed. Patient appears stable on several rechecks.  Family now at bedside.  Heart rate 110-130 with good blood pressure.  Will continue to titrate Cardizem.  Will touch base with cardiology.  Labs personally reviewed and interpreted including: CBC with hemoglobin 9.1 stable, WBC pending anticipate this will be elevated consistent with patient's known CLL.  Remainder of chemistries, TSH and troponin pending.  Imaging personally visualized and interpreted including: Chest x-ray, agree negative for infiltrate.  Reviewed pertinent lab work and imaging with patient at bedside. Questions answered.   Most current vital signs reviewed and are as follows: BP 131/68 (BP Location: Right Arm)   Pulse (!) 137   Temp (!) 97.5 F (36.4 C) (Oral)   Resp 19   Ht 5\' 9"  (1.753 m)   Wt 71.2 kg   SpO2 100%   BMI 23.18 kg/m   Plan: Continue slow titration of Cardizem, will touch base with cardiology for recommendations.  12:42 PM discussed with on-call cardiology who will consult on patient.  Awaiting remainder of lab workup.  Plan for hospitalist admission.  1:22 PM Reassessment performed. Patient appears well.  His blood pressure has improved as his rate lowers.  He is now down to the upper 90s and low 100s with blood  pressure in the 130/140 systolic range.  He states that he feels fairly well.  Updated on plan for admission.  Labs personally reviewed and interpreted including: CBC with white blood cell count 82.3; BMP glucose 154, bicarb 20  with anion gap of 14, creatinine 1.40; magnesium slightly low at 1.6; TSH normal; troponin normal.  Reviewed pertinent lab work and imaging with patient at bedside. Questions answered.   Most current vital signs reviewed and are as follows: BP 121/69   Pulse (!) 116   Temp (!) 97.5 F (36.4 C) (Oral)   Resp 12   Ht 5\' 9"  (1.753 m)   Wt 71.2 kg   SpO2 100%   BMI 23.18 kg/m   Plan: Admit to hospitalist service.   Discussed patient with Dr. Katrinka Blazing of [facility] hospitalist who will see.    CRITICAL CARE Performed by: Renne Crigler Total critical care time: 45 minutes Critical care time was exclusive of separately billable procedures and treating other patients. Critical care was necessary to treat or prevent imminent or life-threatening deterioration. Critical care was time spent personally by me on the following activities: development of treatment plan with patient and/or surrogate as well as nursing, discussions with consultants, evaluation of patient's response to treatment, examination of patient, obtaining history from patient or surrogate, ordering and performing treatments and interventions, ordering and review of laboratory studies, ordering and review of radiographic studies, pulse oximetry and re-evaluation of patient's condition.                                  Medical Decision Making Amount and/or Complexity of Data Reviewed Labs: ordered. Radiology: ordered.  Risk Prescription drug management.   Patient with history of paroxysmal A-fib, here with atrial fibrillation with RVR today.  History of CLL which appears chronic, followed as outpatient.  Initially was mildly hypotensive but blood pressure improved with IV fluids and rate control.   Cardiology to consult.  Anticoagulation decision pending.        Final Clinical Impression(s) / ED Diagnoses Final diagnoses:  Atrial fibrillation with RVR [facility])    Rx / DC Orders ED Discharge Orders          Ordered    Amb referral to AFIB Clinic        05/20/23 1109              Renne Crigler, PA-C 05/20/23 1525    Coral Spikes, DO 05/20/23 1541

## 2023-05-20 NOTE — Consult Note (Signed)
Cardiology Consultation   Patient ID: Eric Harrington MRN: [id number]; DOB: 10/12/1940  Admit date: 05/20/2023 Date of Consult: 05/20/2023  PCP:  Eric Beams, MD   [facility] Providers Cardiologist:  Eric Decamp, MD   {   Patient Profile:   Eric Harrington is a 82 y.o. male with a hx of diabetes mellitus, stage 3a CKD, CLL, hyperlipidemia, hypertension, palpitations, near syncopal spells secondary to severe orthostatic hypotension, with history of episodes of atrial fibrillation, with current loop recorder placement who is being seen 05/20/2023 for the evaluation of atrial fibrillation with RVR at the request of Dr. Madelyn Harrington.  History of Present Illness:   Eric Harrington is a patient of Dr. Jacinto Harrington who was last seen 04/15/2023. He had an episode of atrial fibrillation on 06/04/2022 while admitted to the hospital undergoing Enterococcus faecalis sepsis. Dr. Jacinto Harrington then stopped Eric Harrington in 09/2022 as the patient had no recurrence of atrial fibrillation. The patient then presented to the ED 10/2022 with transient episode of aphasia, resolved less than 1 hour, MRI was negative for stroke, patient was in sinus rhythm during the ED evaluation. There was discussion on whether the TIA that admission was related to Eric atrial fibrillation or atherosclerotic aortic disease. The patient also has a history of CLL, chronic anemia, thrombocytopenia, severe orthostatic hypotension and risk of fall. Therefore the risk of bleeding with Harrington was taken into consideration due to Eric increased bleeding risks. They reached the shared decision of proceeding with loop monitor implantation (serial number [id number], placed 12/04/2022) to monitor for recurrence of atrial fibrillation. Patient was placed on ASA 81 mg daily on 04/15/2023 for post TIA and was to stop ASA one week prior to Eric cervical spine surgery (05/02/2023).  Patient reported to [facility] ED on 05/20/2023 via EMS with a chief complaint of  lightheadedness and shortness of breath. Patient states that Eric symptoms started after he took a shower today. Says that he felt as thought he was going to pass out. Per EMS patient was found to be in atrial fibrillation with RVR with a HR in the 150s with associated tachypnea in the 40s. EMS administered 10 mg of Cardizem which briefly lowered Eric HR but resulted in hypotension into the 90s. He was then give 500 cc of normal saline. Most recent vitals show HR 99, BP 131/72 after receiving diltiazem 10 mg/hour for 2 hours and midodrine 10 mg once.   Patient was resting comfortably in the ED with Eric family present. They all agreed that this morning he was experiencing weakness, but they state that with Eric orthostatic hypotension he typically is weaker in the mornings. Patient states that he was experiencing weakness and numbness worse than normal this morning. Eric Harrington typically takes Eric sitting and standing vitals in the morning and she was unable to get standing vitals due to Eric weakness. These symptoms are what prompted them to call EMS. Patient denied any chest pain, shortness of breath or awareness of palpitations/tachycardia. He states that he has been told that he does not remain in atrial fibrillation long term but he admits that he has never been aware of Eric Afib during prior episodes. Patient and Eric Harrington deny any recent falls, blood in urine/stool.   Past Medical History:  Diagnosis Date   Arthritis    Chronic kidney disease    CKD3   Chronic Leukemia    Diabetes mellitus without complication (HCC)    Dysrhythmia    A. Fib  GERD (gastroesophageal reflux disease)    History of kidney stones    Hyperlipemia 12/02/2018   Hyperlipidemia    Hypertension    Hx. Now has lower BP issues. On midodrine   Loop recorder: [employer] Assert-IQ 3 Loop recorder. Serial # [id number] 12/04/2022 12/04/2022   Pneumonia    Stroke [facility])    TIA x2 since Dec 2023. No deficits   Type 2 diabetes mellitus  with complication, without long-term current use of insulin (HCC) 12/02/2018    Past Surgical History:  Procedure Laterality Date   ANTERIOR CERVICAL DECOMP/DISCECTOMY FUSION N/A 05/01/2023   Procedure: Anterior Cervical Decompression/ Discectomy Fusion Cervical Four-Cervical Five - Cervical Five-Cervical Six;  Surgeon: Julio Sicks, MD;  Location: [facility] OR;  Service: Neurosurgery;  Laterality: N/A;  3C   CARDIAC CATHETERIZATION  2017   CATARACT EXTRACTION W/ INTRAOCULAR LENS IMPLANT Bilateral    COLONOSCOPY     VENTRAL HERNIA REPAIR       Home Medications:  Prior to Admission medications   Medication Sig Start Date End Date Taking? Authorizing Provider  acetaminophen (TYLENOL) 500 MG tablet Take 1,000 mg by mouth every 8 (eight) hours as needed for moderate pain, fever or headache.   Yes [provider]  ASCORBIC ACID PO Take 1 tablet by mouth daily. Vitamin C.   Yes [provider]  aspirin (ASPIRIN CHILDRENS) 81 MG chewable tablet Chew 1 tablet (81 mg total) by mouth daily. 04/15/23  Yes Eric Decamp, MD  cetirizine (ZYRTEC) 10 MG tablet Take 10 mg by mouth daily as needed for allergies.   Yes [provider]  Cholecalciferol (VITAMIN D-3 PO) Take 1 capsule by mouth daily.   Yes [provider]  Cyanocobalamin (VITAMIN B12) 1000 MCG TBCR Take 1 tablet by mouth daily.   Yes [provider]  docusate sodium (COLACE) 100 MG capsule Take 100 mg by mouth daily.    Yes [provider]  esomeprazole (NEXIUM 24HR) 20 MG capsule Take 20 mg by mouth daily at 12 noon.   Yes [provider]  fexofenadine (ALLEGRA) 180 MG tablet Take 180 mg by mouth daily.   Yes [provider]  FIBER PO Take 3 tablets by mouth daily.   Yes [provider]  fludrocortisone (FLORINEF) 0.1 MG tablet Take 0.1 mg by mouth daily. 10/08/22  Yes [provider]  fluticasone (FLONASE) 50 MCG/ACT nasal spray Place 1 spray into both nostrils  daily as needed for allergies.   Yes [provider]  glimepiride (AMARYL) 2 MG tablet Take 2 mg by mouth daily with breakfast. 11/14/20  Yes [provider]  HYDROcodone-acetaminophen (NORCO/VICODIN) 5-325 MG tablet Take 1 tablet by mouth every 4 (four) hours as needed for moderate pain (pain score 4-6) ((score 4 to 6)). 05/02/23  Yes Pool, Sherilyn Cooter, MD  metFORMIN (GLUCOPHAGE) 500 MG tablet Take 500 mg by mouth 2 (two) times daily with a meal.   Yes [provider]  midodrine (PROAMATINE) 10 MG tablet Take 1 tablet (10 mg total) by mouth 3 (three) times daily with meals. While awake. Do not lay down for 3-4 hours after taking 07/31/22  Yes Eric Decamp, MD  Multiple Vitamins-Minerals (ZINC PO) Take 1 tablet by mouth daily.   Yes [provider]  Polyethyl Glycol-Propyl Glycol (SYSTANE OP) Place 1 drop into both eyes 2 (two) times daily as needed (dryness, irritation.).   Yes [provider]  Pyridoxine HCl (VITAMIN B-6 PO) Take 1 tablet by mouth daily.  Yes [provider]  rosuvastatin (CRESTOR) 5 MG tablet Take 5 mg by mouth every evening.   Yes [provider]  albuterol (VENTOLIN HFA) 108 (90 Base) MCG/ACT inhaler Inhale 1 puff into the lungs every 4 (four) hours as needed for wheezing or shortness of breath. Patient not taking: Reported on 05/20/2023    [provider]    Inpatient Medications: Scheduled Meds:  albuterol  2.5 mg Nebulization Q6H   apixaban  5 mg Oral BID   docusate sodium  100 mg Oral Daily   fludrocortisone  0.1 mg Oral Daily   loratadine  10 mg Oral Daily   metoprolol tartrate  25 mg Oral BID   midodrine  10 mg Oral TID WC   pantoprazole  40 mg Oral Daily   sodium chloride flush  3 mL Intravenous Q12H   vitamin B-12  100 mcg Oral Daily   Continuous Infusions:  diltiazem (CARDIZEM) infusion 12.5 mg/hr (05/20/23 1607)   magnesium sulfate bolus IVPB 2 g (05/20/23 1541)   PRN Meds: acetaminophen  **OR** acetaminophen, artificial tears, fluticasone, HYDROcodone-acetaminophen  Allergies:    Allergies  Allergen Reactions   Norvasc [Amlodipine Besylate] Rash    Fatigue  Fever  Chills    Augmentin [Amoxicillin-Pot Clavulanate] Other (See Comments)    Stomach upset   Bystolic [Nebivolol Hcl] Other (See Comments)    Weakness    Coq10 [Coenzyme Q10] Other (See Comments)    Unknown reaction   Cozaar [Losartan] Other (See Comments)    Unknown reaction   Farxiga [Dapagliflozin] Other (See Comments)    Excessive urination Dehydration   Glucotrol [Glipizide] Other (See Comments)    Unknown reaction   Januvia [Sitagliptin] Other (See Comments)    Unknown reaction   Keflex [Cephalexin] Other (See Comments)    Unknown reaction   Lipitor [Atorvastatin Calcium]    Lipitor [Atorvastatin] Other (See Comments)    Unknown reaction   Lopid [Gemfibrozil] Other (See Comments)    Unknown reaction   Omnicef [Cefdinir] Other (See Comments)    Unknown reaction   Prilosec [Omeprazole] Other (See Comments)    Unknown reaction   Toprol Xl [Metoprolol] Other (See Comments)    Weakness    Vibra-Tab [Doxycycline] Other (See Comments)    Fever  Chills   Zestril [Lisinopril] Other (See Comments)    Constipation     Zetia [Ezetimibe] Other (See Comments)    Unknown reaction   Zocor [Simvastatin] Other (See Comments)    Unknown reaction   Neurontin [Gabapentin] Other (See Comments)    Unknown reaction    Social History:   Social History   Socioeconomic History   Marital status: Married    Spouse name: Not on file   Number of children: 2   Years of education: Not on file   Highest education level: Not on file  Occupational History   Not on file  Tobacco Use   Smoking status: Former    Current packs/day: 0.00    Average packs/day: 1 pack/day for 30.0 years (30.0 ttl pk-yrs)    Types: Cigarettes    Start date: 69    Quit date: 53    Years since quitting: 45.0   Smokeless  tobacco: Never   Tobacco comments:    started at age 82  Vaping Use   Vaping status: Never Used  Substance and Sexual Activity   Alcohol use: Never   Drug use: Not Currently   Sexual activity: Not Currently    Birth  control/protection: None  Other Topics Concern   Not on file  Social History Narrative   2 Cups of Coffee   Right Handed    Social Drivers of Health   Financial Resource Strain: Not on file  Food Insecurity: No Food Insecurity (05/20/2023)   Hunger Vital Sign    Worried About Running Out of Food in the Last Year: Never true    Ran Out of Food in the Last Year: Never true  Transportation Needs: No Transportation Needs (05/20/2023)   PRAPARE - Administrator, Civil Service (Medical): No    Lack of Transportation (Non-Medical): No  Physical Activity: Not on file  Stress: Not on file  Social Connections: Moderately Integrated (05/20/2023)   Social Connection and Isolation Panel [NHANES]    Frequency of Communication with Friends and Family: More than three times a week    Frequency of Social Gatherings with Friends and Family: Twice a week    Attends Religious Services: More than 4 times per year    Active Member of [employer] or Organizations: No    Attends Banker Meetings: Never    Marital Status: Married  Catering manager Violence: Not At Risk (05/20/2023)   Humiliation, Afraid, Rape, and Kick questionnaire    Fear of Current or Ex-Partner: No    Emotionally Abused: No    Physically Abused: No    Sexually Abused: No    Family History:   Family History  Problem Relation Age of Onset   Lung cancer Mother    Heart attack Father    Dementia Sister    Diabetes Brother    Diabetes Sister    Heart attack Brother      ROS:  Please see the history of present illness.  All other ROS reviewed and negative.     Physical Exam/Data:   Vitals:   05/20/23 1325 05/20/23 1513 05/20/23 1515 05/20/23 1600  BP: 131/72  113/86 127/67  Pulse: 99   90 (!) 107  Resp: 12  20 18   Temp:  97.6 F (36.4 C)    TempSrc:      SpO2: 100%  100% 100%  Weight:      Height:        Intake/Output Summary (Last 24 hours) at 05/20/2023 1630 Last data filed at 05/20/2023 1300 Gross per 24 hour  Intake 1000 ml  Output --  Net 1000 ml      05/20/2023   11:08 AM 05/01/2023    6:55 AM 04/15/2023    2:19 PM  Last 3 Weights  Weight (lbs) 157 lb 168 lb 168 lb  Weight (kg) 71.215 kg 76.204 kg 76.204 kg     Body mass index is 23.18 kg/m.  General:  In no acute distress HEENT: normal Neck: no JVD Vascular: No carotid bruits; Distal pulses 2+ bilaterally Cardiac:  normal S1, S2; regular rate, irregular rhythm; no murmur  Lungs:  clear to auscultation bilaterally, no wheezing, rhonchi or rales  Abd: soft, nontender Ext: no edema Musculoskeletal:  No deformities Skin: warm and dry  Neuro:  No focal abnormalities noted Psych:  Normal affect   EKG:  The EKG was personally reviewed and demonstrates:  atrial fibrillation with RVR, HR 133 bpm Telemetry:  Telemetry was personally reviewed and demonstrates:  atrial fibrillation, HR 84-100 bpm  Relevant CV Studies: Echocardiogram (05/20/2023) -- pending final results   Laboratory Data:  High Sensitivity Troponin:   Recent Labs  Lab 05/20/23 1119 05/20/23 1325  TROPONINIHS 11 49*     Chemistry Recent Labs  Lab 05/20/23 1119  NA 137  K 4.6  CL 103  CO2 20*  GLUCOSE 154*  BUN 16  CREATININE 1.40*  CALCIUM 9.4  MG 1.6*  GFRNONAA 51*  ANIONGAP 14    No results for input(s): "PROT", "ALBUMIN", "AST", "ALT", "ALKPHOS", "BILITOT" in the last 168 hours. Lipids No results for input(s): "CHOL", "TRIG", "HDL", "LABVLDL", "LDLCALC", "CHOLHDL" in the last 168 hours.  Hematology Recent Labs  Lab 05/20/23 1119  WBC 82.3*  RBC 2.86*  HGB 9.1*  HCT 29.2*  MCV 102.1*  MCH 31.8  MCHC 31.2  RDW 14.8  PLT 138*   Thyroid  Recent Labs  Lab 05/20/23 1119  TSH 0.542    BNPNo  results for input(s): "BNP", "PROBNP" in the last 168 hours.  DDimer No results for input(s): "DDIMER" in the last 168 hours.  Radiology/Studies:  DG Chest Port 1 View Result Date: 05/20/2023 CLINICAL DATA:  Atrial fibrillation. EXAM: PORTABLE CHEST 1 VIEW COMPARISON:  October 25, 2022. FINDINGS: Stable cardiomediastinal silhouette. Left lung is clear. Minimal right basilar subsegmental atelectasis is noted. Bony thorax is unremarkable. IMPRESSION: Minimal right basilar subsegmental atelectasis. Electronically Signed   By: Lupita Raider M.D.   On: 05/20/2023 12:31    Assessment and Plan:   Atrial fibrillation with episode of RVR Patient has documented Afib with RVR on ED EKG -- 05/20/2023, 11:10am Requested loop recorder report from [employer] rep -- Loop report shows episode of atrial fibrillation starting around 8:52am and lasting 6 hours, mean ventricular rate being 121 bpm. Loop report pages scanned into media tab in chart (5 pages) -- Agree with restarting Harrington 5 mg BID due to history of CVA with recurrent atrial fibrillation, after considering comorbidities agree that the benefits outweigh the risks  -- Reached out to Dr. Jordan Likes, who did patient's cervical decompression and fusion on 05/02/23 to ensure there are no contraindications for starting Harrington in this patient, office returned call and stated Dr. Jordan Likes is okay with starting Harrington --  Due to bleeding risks while on Harrington and patients medical history consider outpatient evaluation for Watchman device placement in future, will set up follow up with EP in 2-3 weeks  -- Plan to wean off cardizem  With history of orthostatic hypotension, consider beta blocker as better alternative for rate control -- Start metoprolol tartrate 25 mg BID today, if HR and BP remain stable consider metoprolol succinate 50 mg tomorrow   Per primary  CLL Leukocytosis Macrocytic anemia Thrombocytopenia T2DM, controlled Hypomagnesemia Orthostatic  hypotension Hyperlipidemia GERD History of TIA  Risk Assessment/Risk Scores:        CHA2DS2-VASc Score = 7   This indicates a 11.2% annual risk of stroke. The patient's score is based upon: CHF History: 0 HTN History: 1 Diabetes History: 1 Stroke History: 2 (TIA) Vascular Disease History: 1 Age Score: 2 Gender Score: 0      For questions or updates, please contact [facility] Please consult [email or web address] for contact info under    Signed, Olena Leatherwood, PA-C  05/20/2023 4:30 PM

## 2023-05-21 DIAGNOSIS — K219 Gastro-esophageal reflux disease without esophagitis: Secondary | ICD-10-CM | POA: Diagnosis present

## 2023-05-21 DIAGNOSIS — N1831 Chronic kidney disease, stage 3a: Secondary | ICD-10-CM | POA: Diagnosis present

## 2023-05-21 DIAGNOSIS — C9110 Chronic lymphocytic leukemia of B-cell type not having achieved remission: Secondary | ICD-10-CM | POA: Diagnosis present

## 2023-05-21 DIAGNOSIS — D6869 Other thrombophilia: Secondary | ICD-10-CM | POA: Diagnosis present

## 2023-05-21 DIAGNOSIS — Z79899 Other long term (current) drug therapy: Secondary | ICD-10-CM | POA: Diagnosis not present

## 2023-05-21 DIAGNOSIS — D539 Nutritional anemia, unspecified: Secondary | ICD-10-CM | POA: Diagnosis present

## 2023-05-21 DIAGNOSIS — Z833 Family history of diabetes mellitus: Secondary | ICD-10-CM | POA: Diagnosis not present

## 2023-05-21 DIAGNOSIS — Z8249 Family history of ischemic heart disease and other diseases of the circulatory system: Secondary | ICD-10-CM | POA: Diagnosis not present

## 2023-05-21 DIAGNOSIS — D509 Iron deficiency anemia, unspecified: Secondary | ICD-10-CM | POA: Diagnosis present

## 2023-05-21 DIAGNOSIS — D696 Thrombocytopenia, unspecified: Secondary | ICD-10-CM | POA: Diagnosis present

## 2023-05-21 DIAGNOSIS — I129 Hypertensive chronic kidney disease with stage 1 through stage 4 chronic kidney disease, or unspecified chronic kidney disease: Secondary | ICD-10-CM | POA: Diagnosis present

## 2023-05-21 DIAGNOSIS — I480 Paroxysmal atrial fibrillation: Secondary | ICD-10-CM | POA: Diagnosis present

## 2023-05-21 DIAGNOSIS — Z881 Allergy status to other antibiotic agents status: Secondary | ICD-10-CM | POA: Diagnosis not present

## 2023-05-21 DIAGNOSIS — Z7952 Long term (current) use of systemic steroids: Secondary | ICD-10-CM | POA: Diagnosis not present

## 2023-05-21 DIAGNOSIS — I452 Bifascicular block: Secondary | ICD-10-CM | POA: Diagnosis present

## 2023-05-21 DIAGNOSIS — Z7901 Long term (current) use of anticoagulants: Secondary | ICD-10-CM | POA: Diagnosis not present

## 2023-05-21 DIAGNOSIS — E785 Hyperlipidemia, unspecified: Secondary | ICD-10-CM | POA: Diagnosis present

## 2023-05-21 DIAGNOSIS — Z87891 Personal history of nicotine dependence: Secondary | ICD-10-CM | POA: Diagnosis not present

## 2023-05-21 DIAGNOSIS — Z7982 Long term (current) use of aspirin: Secondary | ICD-10-CM | POA: Diagnosis not present

## 2023-05-21 DIAGNOSIS — Z801 Family history of malignant neoplasm of trachea, bronchus and lung: Secondary | ICD-10-CM | POA: Diagnosis not present

## 2023-05-21 DIAGNOSIS — Z888 Allergy status to other drugs, medicaments and biological substances status: Secondary | ICD-10-CM | POA: Diagnosis not present

## 2023-05-21 DIAGNOSIS — E1122 Type 2 diabetes mellitus with diabetic chronic kidney disease: Secondary | ICD-10-CM | POA: Diagnosis present

## 2023-05-21 DIAGNOSIS — Z7984 Long term (current) use of oral hypoglycemic drugs: Secondary | ICD-10-CM | POA: Diagnosis not present

## 2023-05-21 LAB — CBC
HCT: 25.6 % — ABNORMAL LOW (ref 39.0–52.0)
Hemoglobin: 7.9 g/dL — ABNORMAL LOW (ref 13.0–17.0)
MCH: 32.1 pg (ref 26.0–34.0)
MCHC: 30.9 g/dL (ref 30.0–36.0)
MCV: 104.1 fL — ABNORMAL HIGH (ref 80.0–100.0)
Platelets: 118 10*3/uL — ABNORMAL LOW (ref 150–400)
RBC: 2.46 MIL/uL — ABNORMAL LOW (ref 4.22–5.81)
RDW: 15.2 % (ref 11.5–15.5)
WBC: 101.5 10*3/uL (ref 4.0–10.5)
nRBC: 0 % (ref 0.0–0.2)

## 2023-05-21 LAB — BASIC METABOLIC PANEL
Anion gap: 8 (ref 5–15)
BUN: 20 mg/dL (ref 8–23)
CO2: 23 mmol/L (ref 22–32)
Calcium: 9 mg/dL (ref 8.9–10.3)
Chloride: 105 mmol/L (ref 98–111)
Creatinine, Ser: 1.27 mg/dL — ABNORMAL HIGH (ref 0.61–1.24)
GFR, Estimated: 57 mL/min — ABNORMAL LOW (ref >60)
Glucose, Bld: 141 mg/dL — ABNORMAL HIGH (ref 70–99)
Potassium: 4.6 mmol/L (ref 3.5–5.1)
Sodium: 136 mmol/L (ref 135–145)

## 2023-05-21 LAB — GLUCOSE, CAPILLARY
Glucose-Capillary: 135 mg/dL — ABNORMAL HIGH (ref 70–99)
Glucose-Capillary: 151 mg/dL — ABNORMAL HIGH (ref 70–99)
Glucose-Capillary: 181 mg/dL — ABNORMAL HIGH (ref 70–99)
Glucose-Capillary: 145 mg/dL — ABNORMAL HIGH (ref 70–99)

## 2023-05-21 LAB — MAGNESIUM: Magnesium: 2.2 mg/dL (ref 1.7–2.4)

## 2023-05-21 MED ORDER — ONDANSETRON HCL 4 MG/2ML IJ SOLN
4.0000 mg | Freq: Four times a day (QID) | INTRAMUSCULAR | Status: DC | PRN
  Filled 2023-05-21: qty 2

## 2023-05-21 MED ORDER — DIGOXIN 125 MCG PO TABS
0.1250 mg | ORAL_TABLET | Freq: Every day | ORAL | Status: DC
  Administered 2023-05-21: 0.125 mg via ORAL
  Filled 2023-05-21: qty 1

## 2023-05-21 MED ORDER — ALBUTEROL SULFATE (2.5 MG/3ML) 0.083% IN NEBU: 2.5000 mg | INHALATION_SOLUTION | Freq: Four times a day (QID) | RESPIRATORY_TRACT | Status: DC | PRN

## 2023-05-21 MED ORDER — AMIODARONE HCL IN DEXTROSE 360-4.14 MG/200ML-% IV SOLN
30.0000 mg/h | INTRAVENOUS | Status: AC
  Administered 2023-05-22 – 2023-05-23 (×4): 30 mg/h via INTRAVENOUS
  Filled 2023-05-21 (×3): qty 200

## 2023-05-21 MED ORDER — AMIODARONE LOAD VIA INFUSION
150.0000 mg | Freq: Once | INTRAVENOUS | Status: AC
  Administered 2023-05-21: 150 mg via INTRAVENOUS
  Filled 2023-05-21: qty 83.34

## 2023-05-21 MED ORDER — AMIODARONE HCL IN DEXTROSE 360-4.14 MG/200ML-% IV SOLN
60.0000 mg/h | INTRAVENOUS | Status: AC
  Administered 2023-05-21 (×2): 60 mg/h via INTRAVENOUS
  Filled 2023-05-21 (×2): qty 200

## 2023-05-21 NOTE — Assessment & Plan Note (Addendum)
 Chronic orthostatic hypotension BP 120 lying to 87 standing today - Continue midodrine and Florinef - Start abdominal binder and ted hose

## 2023-05-21 NOTE — Assessment & Plan Note (Addendum)
 Discussed with hematology.  Not currently on chemotherapy.  No contraindication to Eliquis.  White count trending up significantly this year, and at recent baseline around 100 K. - Continue Eliquis -Outpatient follow-up with hematology - Trend CBC

## 2023-05-21 NOTE — Consult Note (Signed)
 ELECTROPHYSIOLOGY CONSULT NOTE    Patient ID: Eric Harrington MRN: [id number], DOB/AGE: Mar 01, 1941 82 y.o.  Admit date: 05/20/2023 Date of Consult: 05/21/2023  Primary Physician: Rolinda Millman, MD Primary Cardiologist: Gordy Bergamo, MD  Electrophysiologist: none    Referring Provider: Dr. Jonel  Patient Profile: Eric Harrington is a 82 y.o. male with a history of parox AFib, orthostatic hypotension on midodrine  and florinef , TIA, CKD stage 3, CLL, HLD, HTN, T2DM, recent ACDF (04/2023), ILR in situ  who is being seen today for the evaluation of Afib w RVR at the request of Dr. Jonel.  HPI:  Eric Harrington is a 82 y.o. male with PMH as above.  Patient has been off OAC with ILR in place monitoring for afib recurrence.   He presented to [facility] ED 12/30 via EMS with c/o LH, SOB. Initial EMS EKG with AFib w RVR up to 150s. Historically, he has been asymptomatic during AFib episodes. Gen cards started eliquis  12/30 after discussion with patient regarding risks/benefits. They also started 25mg  lopressor , and this AM started 0.125 digoxin    Currently, he feels off with palpitations and decreased energy. He denies chest pain, chest pressure, SOB, edema.     Labs Potassium4.6 (12/31 0449) Magnesium   2.2 (12/31 0836) Creatinine, ser  1.27* (12/31 0449) PLT  118* (12/31 0449) HGB  7.9* (12/31 0449) WBC 101.5* (12/31 0449) Troponin I (High Sensitivity)49* (12/30 1325).    Past Medical History:  Diagnosis Date   Arthritis    Chronic kidney disease    CKD3   Chronic Leukemia    Diabetes mellitus without complication (HCC)    Dysrhythmia    A. Fib   GERD (gastroesophageal reflux disease)    History of kidney stones    Hyperlipemia 12/02/2018   Hyperlipidemia    Hypertension    Hx. Now has lower BP issues. On midodrine    Loop recorder: [employer] Assert-IQ 3 Loop recorder. Serial # [id number] 12/04/2022 12/04/2022   Pneumonia    Stroke [facility])    TIA x2 since Dec 2023. No deficits    Type 2 diabetes mellitus with complication, without long-term current use of insulin  (HCC) 12/02/2018     Surgical History:  Past Surgical History:  Procedure Laterality Date   ANTERIOR CERVICAL DECOMP/DISCECTOMY FUSION N/A 05/01/2023   Procedure: Anterior Cervical Decompression/ Discectomy Fusion Cervical Four-Cervical Five - Cervical Five-Cervical Six;  Surgeon: Louis Shove, MD;  Location: [facility] OR;  Service: Neurosurgery;  Laterality: N/A;  3C   CARDIAC CATHETERIZATION  2017   CATARACT EXTRACTION W/ INTRAOCULAR LENS IMPLANT Bilateral    COLONOSCOPY     VENTRAL HERNIA REPAIR       Medications Prior to Admission  Medication Sig Dispense Refill Last Dose/Taking   acetaminophen  (TYLENOL ) 500 MG tablet Take 1,000 mg by mouth every 8 (eight) hours as needed for moderate pain, fever or headache.   05/20/2023   ASCORBIC ACID  PO Take 1 tablet by mouth daily. Vitamin C .   05/19/2023   aspirin  (ASPIRIN  CHILDRENS) 81 MG chewable tablet Chew 1 tablet (81 mg total) by mouth daily.   05/19/2023   cetirizine (ZYRTEC) 10 MG tablet Take 10 mg by mouth daily as needed for allergies.   Past Month   Cholecalciferol  (VITAMIN D -3 PO) Take 1 capsule by mouth daily.   05/19/2023   Cyanocobalamin  (VITAMIN B12) 1000 MCG TBCR Take 1 tablet by mouth daily.   05/19/2023   docusate sodium  (COLACE) 100 MG capsule Take 100 mg by mouth daily.  05/19/2023   esomeprazole (NEXIUM 24HR) 20 MG capsule Take 20 mg by mouth daily at 12 noon.   05/19/2023   fexofenadine (ALLEGRA) 180 MG tablet Take 180 mg by mouth daily.   Past Month   FIBER PO Take 3 tablets by mouth daily.   05/19/2023   fludrocortisone  (FLORINEF ) 0.1 MG tablet Take 0.1 mg by mouth daily.   Past Month   fluticasone  (FLONASE ) 50 MCG/ACT nasal spray Place 1 spray into both nostrils daily as needed for allergies.   Past Month   glimepiride  (AMARYL ) 2 MG tablet Take 2 mg by mouth daily with breakfast.   05/19/2023   HYDROcodone -acetaminophen  (NORCO/VICODIN) 5-325  MG tablet Take 1 tablet by mouth every 4 (four) hours as needed for moderate pain (pain score 4-6) ((score 4 to 6)). 30 tablet 0 Past Month   metFORMIN  (GLUCOPHAGE ) 500 MG tablet Take 500 mg by mouth 2 (two) times daily with a meal.   05/19/2023   midodrine  (PROAMATINE ) 10 MG tablet Take 1 tablet (10 mg total) by mouth 3 (three) times daily with meals. While awake. Do not lay down for 3-4 hours after taking 270 tablet 3 05/20/2023   Multiple Vitamins-Minerals (ZINC PO) Take 1 tablet by mouth daily.   05/19/2023   Polyethyl Glycol-Propyl Glycol (SYSTANE OP) Place 1 drop into both eyes 2 (two) times daily as needed (dryness, irritation.).   Past Month   Pyridoxine  HCl (VITAMIN B-6 PO) Take 1 tablet by mouth daily.   05/19/2023   rosuvastatin  (CRESTOR ) 5 MG tablet Take 5 mg by mouth every evening.   05/19/2023   albuterol  (VENTOLIN  HFA) 108 (90 Base) MCG/ACT inhaler Inhale 1 puff into the lungs every 4 (four) hours as needed for wheezing or shortness of breath. (Patient not taking: Reported on 05/20/2023)   Not Taking    Inpatient Medications:   apixaban   5 mg Oral BID   digoxin   0.125 mg Oral Daily   docusate sodium   100 mg Oral Daily   fludrocortisone   0.1 mg Oral Daily   insulin  aspart  0-9 Units Subcutaneous TID WC   loratadine   10 mg Oral Daily   metoprolol  tartrate  25 mg Oral BID   midodrine   10 mg Oral TID WC   pantoprazole   40 mg Oral Daily   rosuvastatin   5 mg Oral QPM   sodium chloride  flush  3 mL Intravenous Q12H   vitamin B-12  100 mcg Oral Daily    Allergies:  Allergies  Allergen Reactions   Norvasc [Amlodipine Besylate] Rash    Fatigue  Fever  Chills    Augmentin [Amoxicillin -Pot Clavulanate] Other (See Comments)    Stomach upset   Bystolic [Nebivolol Hcl] Other (See Comments)    Weakness    Coq10 [Coenzyme Q10] Other (See Comments)    Unknown reaction   Cozaar [Losartan] Other (See Comments)    Unknown reaction   Farxiga [Dapagliflozin] Other (See Comments)     Excessive urination Dehydration   Glucotrol [Glipizide] Other (See Comments)    Unknown reaction   Januvia [Sitagliptin] Other (See Comments)    Unknown reaction   Keflex [Cephalexin] Other (See Comments)    Unknown reaction   Lipitor [Atorvastatin Calcium ]    Lipitor [Atorvastatin] Other (See Comments)    Unknown reaction   Lopid [Gemfibrozil] Other (See Comments)    Unknown reaction   Omnicef [Cefdinir] Other (See Comments)    Unknown reaction   Prilosec [Omeprazole] Other (See Comments)    Unknown reaction  Toprol  Xl [Metoprolol ] Other (See Comments)    Weakness    Vibra-Tab [Doxycycline] Other (See Comments)    Fever  Chills   Zestril [Lisinopril] Other (See Comments)    Constipation     Zetia [Ezetimibe] Other (See Comments)    Unknown reaction   Zocor [Simvastatin] Other (See Comments)    Unknown reaction   Neurontin [Gabapentin] Other (See Comments)    Unknown reaction    Family History  Problem Relation Age of Onset   Lung cancer Mother    Heart attack Father    Dementia Sister    Diabetes Brother    Diabetes Sister    Heart attack Brother      Physical Exam: Vitals:   05/21/23 0753 05/21/23 0855 05/21/23 0949 05/21/23 1204  BP: (!) 141/86   (!) 128/93  Pulse:  94 (!) 112 (!) 107  Resp:  17  18  Temp:    97.6 F (36.4 C)  TempSrc:      SpO2:    98%  Weight:      Height:        GEN- NAD, A&O x 3, normal affect HEENT: Normocephalic, atraumatic Lungs- CTAB, Normal effort.  Heart- Regular rate and rhythm, No M/G/R.  GI- Soft, NT, ND.  Extremities- No clubbing, cyanosis, or edema   Radiology/Studies: ECHOCARDIOGRAM COMPLETE Result Date: 05/20/2023    ECHOCARDIOGRAM REPORT   Patient Name:   YASIEL GOYNE Date of Exam: 05/20/2023 Medical Rec #:  [id number]     Height:       69.0 in Accession #:    [id number]    Weight:       157.0 lb Date of Birth:  Sep 26, 1940     BSA:          1.864 m Patient Age:    82 years      BP:           131/72 mmHg Patient  Gender: M             HR:           87 bpm. Exam Location:  Inpatient Procedure: 2D Echo, Cardiac Doppler and Color Doppler Indications:    Atrial Fibrillation I48.91  History:        Patient has prior history of Echocardiogram examinations, most                 recent 10/26/2022. Stroke and CKD, stage 3, Arrythmias:Atrial                 Fibrillation, Signs/Symptoms:Hypotension; Risk                 Factors:Hypertension, Diabetes and Dyslipidemia.  Sonographer:    Thea Norlander RCS Referring Phys: [phone] RONDELL A SMITH IMPRESSIONS  1. Left ventricular ejection fraction, by estimation, is 55 to 60%. The left ventricle has normal function. The left ventricle has no regional wall motion abnormalities. Left ventricular diastolic parameters are indeterminate.  2. Right ventricular systolic function is normal. The right ventricular size is normal. There is normal pulmonary artery systolic pressure.  3. The mitral valve is degenerative. Trivial mitral valve regurgitation. No evidence of mitral stenosis.  4. The aortic valve is tricuspid. Aortic valve regurgitation is trivial. No aortic stenosis is present.  5. The inferior vena cava is dilated in size with >50% respiratory variability, suggesting right atrial pressure of 8 mmHg. FINDINGS  Left Ventricle: Left ventricular ejection fraction, by estimation, is 55 to 60%. The  left ventricle has normal function. The left ventricle has no regional wall motion abnormalities. The left ventricular internal cavity size was normal in size. There is  no left ventricular hypertrophy. Left ventricular diastolic parameters are indeterminate. Indeterminate filling pressures. Right Ventricle: The right ventricular size is normal. No increase in right ventricular wall thickness. Right ventricular systolic function is normal. There is normal pulmonary artery systolic pressure. The tricuspid regurgitant velocity is 2.43 m/s, and  with an assumed right atrial pressure of 8 mmHg, the  estimated right ventricular systolic pressure is 31.6 mmHg. Left Atrium: Left atrial size was normal in size. Right Atrium: Right atrial size was normal in size. Pericardium: There is no evidence of pericardial effusion. Mitral Valve: The mitral valve is degenerative in appearance. There is mild thickening of the mitral valve leaflet(s). Trivial mitral valve regurgitation. No evidence of mitral valve stenosis. Tricuspid Valve: The tricuspid valve is normal in structure. Tricuspid valve regurgitation is trivial. No evidence of tricuspid stenosis. Aortic Valve: The aortic valve is tricuspid. Aortic valve regurgitation is trivial. No aortic stenosis is present. Aortic valve peak gradient measures 6.1 mmHg. Pulmonic Valve: The pulmonic valve was normal in structure. Pulmonic valve regurgitation is not visualized. No evidence of pulmonic stenosis. Aorta: The aortic root is normal in size and structure. Venous: The inferior vena cava is dilated in size with greater than 50% respiratory variability, suggesting right atrial pressure of 8 mmHg. IAS/Shunts: No atrial level shunt detected by color flow Doppler.  LEFT VENTRICLE PLAX 2D LVIDd:         4.40 cm   Diastology LVIDs:         3.15 cm   LV e' medial:    9.32 cm/s LV PW:         1.10 cm   LV E/e' medial:  13.0 LV IVS:        0.90 cm   LV e' lateral:   9.32 cm/s LVOT diam:     2.10 cm   LV E/e' lateral: 13.0 LV SV:         48 LV SV Index:   26 LVOT Area:     3.46 cm  RIGHT VENTRICLE            IVC RV S prime:     8.70 cm/s  IVC diam: 2.10 cm TAPSE (M-mode): 1.7 cm LEFT ATRIUM             Index        RIGHT ATRIUM           Index LA diam:        3.80 cm 2.04 cm/m   RA Area:     11.00 cm LA Vol (A2C):   55.3 ml 29.67 ml/m  RA Volume:   17.50 ml  9.39 ml/m LA Vol (A4C):   43.1 ml 23.12 ml/m LA Biplane Vol: 51.8 ml 27.79 ml/m  AORTIC VALVE AV Area (Vmax): 2.25 cm AV Vmax:        123.00 cm/s AV Peak Grad:   6.1 mmHg LVOT Vmax:      79.87 cm/s LVOT Vmean:     54.200  cm/s LVOT VTI:       0.139 m  AORTA Ao Root diam: 3.70 cm Ao Asc diam:  3.30 cm MITRAL VALVE                TRICUSPID VALVE MV Area (PHT): 4.57 cm     TR Peak grad:   23.6  mmHg MV Decel Time: 166 msec     TR Vmax:        243.00 cm/s MV E velocity: 121.00 cm/s                             SHUNTS                             Systemic VTI:  0.14 m                             Systemic Diam: 2.10 cm Annabella Scarce MD Electronically signed by Annabella Scarce MD Signature Date/Time: 05/20/2023/6:30:10 PM    Final    DG Chest Port 1 View Result Date: 05/20/2023 CLINICAL DATA:  Atrial fibrillation. EXAM: PORTABLE CHEST 1 VIEW COMPARISON:  October 25, 2022. FINDINGS: Stable cardiomediastinal silhouette. Left lung is clear. Minimal right basilar subsegmental atelectasis is noted. Bony thorax is unremarkable. IMPRESSION: Minimal right basilar subsegmental atelectasis. Electronically Signed   By: Lynwood Landy Raddle M.D.   On: 05/20/2023 12:31   CUP PACEART REMOTE DEVICE CHECK Result Date: 05/09/2023 ILR summary report received. Battery status OK. Normal device function. No new symptom, tachy, brady, or pause episodes. No new AF episodes. Monthly summary reports and ROV/PRN LA, CVRS  DG Cervical Spine 1 View Result Date: 05/01/2023 CLINICAL DATA:  C4-5 ACDF. EXAM: DG CERVICAL SPINE - 1 VIEW COMPARISON:  MR cervical spine 01/17/2023. FINDINGS: Single cross-table lateral fluoroscopic spot view on the cervical spine shows C4-C6 anterior cervical fusion with interbody spacers. Osseous detail is degraded by technique. IMPRESSION: Intraoperative visualization of C4-6 anterior cervical fusion. Electronically Signed   By: Newell Eke M.D.   On: 05/01/2023 14:37   DG C-Arm 1-60 Min-No Report Result Date: 05/01/2023 Fluoroscopy was utilized by the requesting physician.  No radiographic interpretation.   DG C-Arm 1-60 Min-No Report Result Date: 05/01/2023 Fluoroscopy was utilized by the requesting physician.  No  radiographic interpretation.    EKG:05/21/2023 at [phone] - AFib w RVR, rate 117bpm; RBBB, LAFB  (personally reviewed)  TELEMETRY: AFib rates around 100bpm (personally reviewed)  DEVICE HISTORY:  [facility]ott ILR AFib episode started 12/30 at 8:49am and is ongoing   Assessment/Plan: #) Parox AFib w RVR #) ILR in situ #) orthostatic hypotension Current afib episode began 12/30 at 8:49 via ILR interrogation and is ongoing He was started on OAC 12/30 in the afternoon Do not favor AVN blocking agents for patient's long-standing orthostatic symptoms requiring midodrine  and florinef  AAD options limited to tikosyn vs amiodarone .  Will start amiodarone  IV bolus + gtt Anticipate stopping lopressor , but will continue at this time  #) Hypercoag d/t parox afib #) CLL CHA2DS2-VASc Score = at least 5 [CHF History: 0, HTN History: 0, Diabetes History: 1, Stroke History: 2, Vascular Disease History: 0, Age Score: 2, Gender Score: 0].  Therefore, the patient's annual risk of stroke is 7.2 %.    Continue 5mg  eliquis  BID          For questions or updates, please contact [facility] Please consult [email or web address] for contact info under Cardiology/STEMI.  Signed, Valrie Jia, NP  05/21/2023 3:39 PM

## 2023-05-21 NOTE — Assessment & Plan Note (Addendum)
 Amiodarone started yesterday, still in A-fib today, still quite symptomatic with ambulation  - Continue Eliquis - Stop aspirin - Consult electrophysiology, appreciate cares - Continue low-dose metoprolol - Continue amiodarone infusion

## 2023-05-21 NOTE — Hospital Course (Addendum)
 Mr. Kinney is an 82 y.o. M with CLL, pAF not on AC since his neck surgery earlier this year, orthostatic hypotension on fluorinef and midodrine , CKD IIIa baseline 1.2, and DM who presented with chest discomfort, found to be in Afib with RVR.  12/30: Admitted and Cardiology consulted 12/31: No improvement with metoprolol , EP consulted, started amiodarone  1/1: Still in Afib

## 2023-05-21 NOTE — Assessment & Plan Note (Signed)
 Creatinine stable relative to baseline 1.2

## 2023-05-21 NOTE — Discharge Instructions (Signed)

## 2023-05-21 NOTE — Assessment & Plan Note (Addendum)
 Glucose controlled - Hold glimepiride - Sliding scale corrections - Hold low-dose aspirin - Continue Crestor

## 2023-05-21 NOTE — Assessment & Plan Note (Addendum)
 Iron deficiency Patient's hemoglobin has been trending down over the last year as his white count is trending.  He has had no clinical GI bleeding, history of diverticuli but no history of clinically significant GI bleeding  Ferritin low normal but Iron sat only ~10% - Continue Eliquis  - Check FOBT - Trend hemoglobin - Start ferrous sulfate 

## 2023-05-21 NOTE — Assessment & Plan Note (Signed)
 Platelet count 118, no clinical bleeding

## 2023-05-21 NOTE — Progress Notes (Signed)
  Progress Note   Patient: Eric Harrington FMW:[id number] DOB: 1941-04-14 DOA: 05/20/2023     0 DOS: the patient was seen and examined on 05/21/2023 at 9:40 AM      Brief hospital course: [name] is an 82 y.o. M with CLL, pAF not on [facility] since his neck surgery earlier this year, orthostatic hypotension on fluorinef and midodrine , CKD IIIa baseline 1.2, and DM who presented with chest discomfort, found to be in Afib with RVR.     Assessment and Plan: * Paroxysmal atrial fibrillation (HCC) Rates still in the 1 teens overnight - Consult cardiology - Resume Eliquis  -Stop aspirin  - Continue metoprolol  - Digoxin  versus amiodarone  per cardiology    CLL (chronic lymphocytic leukemia) (HCC) Discussed with hematology.  Not currently on chemotherapy.  No contraindication to Eliquis .  White count trending up significantly this year, and at recent baseline around 100 K. - Resume Eliquis  -Outpatient follow-up with hematology  Orthostatic hypotension Chronic orthostatic hypotension - Continue midodrine  and Florinef   Thrombocytopenia (HCC) Platelet count 118, no clinical bleeding  Macrocytic anemia Patient's hemoglobin has been trending down over the last year as his white count is trending.  He has had no clinical GI bleeding, history of diverticuli but no history of clinically significant GI bleeding - Resume Eliquis  - Check FOBT - Trend hemoglobin  Chronic kidney disease, stage 3a (HCC) Creatinine stable relative to baseline 1.2  Type 2 diabetes mellitus with complication, without long-term current use of insulin  (HCC) Glucose controlled - Hold glimepiride  - Sliding scale corrections - Hold low-dose aspirin  - Continue Crestor           Subjective: Patient chest pain is improved at rest.  He has no dizziness, dyspnea, orthostatic symptoms at the moment.     Physical Exam: BP 120/63 (BP Location: Right Arm)   Pulse (!) 101   Temp 98.8 F (37.1 C) (Oral)   Resp 20    Ht 5' 9 (1.753 m)   Wt 72.9 kg   SpO2 95%   BMI 23.75 kg/m   Elderly adult male, lying in bed, interactive and appropriate Tachycardic, irregular, no murmurs, no peripheral edema Respiratory rate normal, lungs clear without rales or wheezes Abdomen soft no tenderness to palpation or guarding Attention normal, affect normal, judgment and insight appear normal    Data Reviewed: Discussed with cardiology Basic metabolic panel shows stable renal function at baseline 1.2, electrolytes normal CBC shows worsening leukocytosis, worsening anemia, worsening thrombocytopenia   Family Communication: Wife at the bedside    Disposition: Status is: Inpatient         Author: Lonni SHAUNNA Dalton, MD 05/21/2023 7:05 PM  For on call review [email or web address].

## 2023-05-21 NOTE — Progress Notes (Addendum)
 Patient Name: Eric Harrington Date of Encounter: 05/21/2023 [facility] Cardiologist: Gordy Bergamo, MD   Interval Summary  .    82 yr old male with PMH of CKD stage III, CLL under surveillance, hyperlipidemia, hypertension, type 2 DM, orthostatic hypotension on midodrine  and Florinef , paroxysmal A fib, TIA, s/p anterior cervical decompression and fusion 05/01/23, s/p ILP, GERD, who is admitted for chest discomfort/heart palpitation 2/2 recurrence of A fib with RVR.   Patient states he felt poor this morning around 7am, when his heart rate was fast. He states he has not had much problems with his A fib over the years, maybe 3 times total. He always feels quite poor when this occur, feeling dizzy and diaphoretic, looking pale per wife. He felt better now after he received metoprolol  early this morning and heart rate improved to 90-100s. He denied any bleeding.    Vital Signs .    Vitals:   05/21/23 0322 05/21/23 0724 05/21/23 0726 05/21/23 0753  BP: 118/61 109/69 106/69 (!) 141/86  Pulse: 93 (!) 111 (!) 115   Resp: 20 17    Temp: 97.8 F (36.6 C) 97.9 F (36.6 C)    TempSrc: Oral Oral    SpO2: 96% 97%    Weight:      Height:        Intake/Output Summary (Last 24 hours) at 05/21/2023 0808 Last data filed at 05/20/2023 1907 Gross per 24 hour  Intake 1050.56 ml  Output --  Net 1050.56 ml      05/20/2023   10:07 PM 05/20/2023   11:08 AM 05/01/2023    6:55 AM  Last 3 Weights  Weight (lbs) 160 lb 12.8 oz 157 lb 168 lb  Weight (kg) 72.938 kg 71.215 kg 76.204 kg      Telemetry/ECG    A fib RVR 90-130s - Personally Reviewed  Physical Exam .   GEN: No acute distress.   Neck: No JVD Cardiac: Irregularly irregular, no murmurs, rubs, or gallops.  Respiratory: Clear to auscultation bilaterally. On room air. Speaks full sentence.  GI: Soft, nontender, non-distended  MS: No leg edema  Assessment & Plan .     Paroxysmal A fib with RVR - initially diagnosed in 05/2022,  had rare intermittent episodes over the past year, feels poor/dizzy/diaphoretic when A fib RVR occurs, symptom improves when rate controlled around 90-100s; not on rate controlling meds PTA due to lacking recurrence; eliquis  was stopped 04/15/23 before cervical surgery and lacking recurrence - presented with chest discomfort and heart palpitation, found in A fib RVR 130s on initial EKG as well as loop recorder interrogation  - diagnostic so far showed Hs trop 11 >49, Cr 1.4 >1.27, Mag 1.6, WBC 82300>101500, Hgb 9.1 >7.9, PLT 138k>118k. TSH WNL. CXR showed Minimal right basilar subsegmental atelectasis. Echo showed LVEF 55-60%, no RWMA, indeterminate diastolic, normal RV and PASP, trivial MR, trivial AI, IVC dilated  - hypotensive on diltiazem  gtt which was stopped, started on metoprolol  25mg  BID yesterday, rate control is overall improving, still have periods of RVR up to 120-130s but respond quickly to bet a blocker, given hx of severe orthostatic hypotension requiring midodrine  and florinef , plan to add low dose digoxin  to better aid rate control today, will need close monitor for toxicity given CKD III - he is a poor candidate for DCCV  - CHADS2-VASc Score IS 5, started on Eliquis  5mg  BID this admission, this was cleared by neurosurgery team (cervical surgery 05/01/23), he has no active  bleeding so far, Hgb dropped to 7.9 from 9.1, this can be due to hemodilution from IVF use + CLL, will check FOBT, advised the patient to monitor signs of bleeding; if anticoagulation is not tolerated, Watchman can be considered outpatient  CLL - Labs worsening, advised return to hematology for goal discussion   CKD IIIa Type 2 DM Orthostatic hypotension  HLD GERD - per primary team      For questions or updates, please contact [facility] Please consult [email or web address] for contact info under        Signed, Xika Zhao, NP   Patient seen and examined with [facility] NP.  Agree as above, with the following  exceptions and changes as noted below.  Patient seen and examined, surrounded by family and support from church, remains in atrial fibrillation but rates are better controlled and he feels well.  We have continued on metoprolol  as he is tolerating this well currently, however concerns have been raised about his significant orthostasis.  Per his primary cardiologist Dr. Ladona digoxin  has been initiated while in hospital, but this has not been loaded and may not be particularly effective unless we pursue loading dose.  I think at this point it is best to involve EP. Gen: NAD, CV: Irregular and tachycardic, no murmurs, Lungs: clear, Abd: soft, Extrem: Warm, well perfused, no edema, Neuro/Psych: alert and oriented x 3, normal mood and affect. All available labs, radiology testing, previous records reviewed.   Multiple issues at play: -Drop in hemoglobin today with a history of hematologic malignancy, defer to internal medicine service to communicate with hematology if needed, would recommend repeat CBC.  If further decline would then hold Eliquis  and investigate for bleeding source.  Patient may not be a great candidate for Eliquis  long-term if anemic, and could consider Watchman, I have discussed this with the patient and we can set this up as an outpatient -Remains in atrial fibrillation but has difficulty with traditional AV nodal blocking agents given their effect on blood pressure and his orthostatic hypotension for which he takes 2 agents to drain and Florinef .  He is responding well to metoprolol  now but has not done so historically.  We will involve electrophysiology to weigh in on antiarrhythmic therapy options. -Ideally he could be considered for TEE cardioversion later this week if still in atrial fibrillation.  However the patient has recently had cervical neck surgery (05/02/2023), and I fear that it is too soon after surgery to feel confident with necessary procedural neck manipulation.  If TEE  cardioversion is required, we could consider discussing with neurosurgery, but I would like to avoid manipulation of the neck so soon after surgery if possible. -He could be considered for elective cardioversion in 3 to 4 weeks after anticoagulation if adequate rate control is achieved.  Discussed with family, we will review EP recommendations when available.   Sharrod Achille A Saleen Peden, MD 05/21/23 1:22 PM

## 2023-05-22 DIAGNOSIS — D539 Nutritional anemia, unspecified: Secondary | ICD-10-CM | POA: Diagnosis not present

## 2023-05-22 DIAGNOSIS — C9110 Chronic lymphocytic leukemia of B-cell type not having achieved remission: Secondary | ICD-10-CM | POA: Diagnosis not present

## 2023-05-22 DIAGNOSIS — I480 Paroxysmal atrial fibrillation: Secondary | ICD-10-CM | POA: Diagnosis not present

## 2023-05-22 LAB — BASIC METABOLIC PANEL
Anion gap: 7 (ref 5–15)
BUN: 21 mg/dL (ref 8–23)
CO2: 25 mmol/L (ref 22–32)
Calcium: 8.9 mg/dL (ref 8.9–10.3)
Chloride: 103 mmol/L (ref 98–111)
Creatinine, Ser: 1.36 mg/dL — ABNORMAL HIGH (ref 0.61–1.24)
GFR, Estimated: 53 mL/min — ABNORMAL LOW (ref >60)
Glucose, Bld: 160 mg/dL — ABNORMAL HIGH (ref 70–99)
Potassium: 4.6 mmol/L (ref 3.5–5.1)
Sodium: 135 mmol/L (ref 135–145)

## 2023-05-22 LAB — CBC
HCT: 25.1 % — ABNORMAL LOW (ref 39.0–52.0)
Hemoglobin: 7.7 g/dL — ABNORMAL LOW (ref 13.0–17.0)
MCH: 31.2 pg (ref 26.0–34.0)
MCHC: 30.7 g/dL (ref 30.0–36.0)
MCV: 101.6 fL — ABNORMAL HIGH (ref 80.0–100.0)
Platelets: 106 10*3/uL — ABNORMAL LOW (ref 150–400)
RBC: 2.47 MIL/uL — ABNORMAL LOW (ref 4.22–5.81)
RDW: 15.3 % (ref 11.5–15.5)
WBC: 88.9 10*3/uL (ref 4.0–10.5)
nRBC: 0 % (ref 0.0–0.2)

## 2023-05-22 LAB — IRON AND TIBC
Iron: 35 ug/dL — ABNORMAL LOW (ref 45–182)
Saturation Ratios: 11 % — ABNORMAL LOW (ref 17.9–39.5)
TIBC: 323 ug/dL (ref 250–450)
UIBC: 288 ug/dL

## 2023-05-22 LAB — GLUCOSE, CAPILLARY
Glucose-Capillary: 190 mg/dL — ABNORMAL HIGH (ref 70–99)
Glucose-Capillary: 208 mg/dL — ABNORMAL HIGH (ref 70–99)
Glucose-Capillary: 197 mg/dL — ABNORMAL HIGH (ref 70–99)
Glucose-Capillary: 180 mg/dL — ABNORMAL HIGH (ref 70–99)

## 2023-05-22 LAB — VITAMIN B12: Vitamin B-12: 491 pg/mL (ref 180–914)

## 2023-05-22 LAB — FERRITIN: Ferritin: 142 ng/mL (ref 24–336)

## 2023-05-22 LAB — FOLATE: Folate: 16.7 ng/mL (ref >5.9)

## 2023-05-22 MED ORDER — POLYETHYLENE GLYCOL 3350 17 G PO PACK
17.0000 g | PACK | Freq: Every day | ORAL | Status: DC
  Administered 2023-05-22 – 2023-05-24 (×3): 17 g via ORAL
  Filled 2023-05-22 (×3): qty 1

## 2023-05-22 MED ORDER — FERROUS SULFATE 325 (65 FE) MG PO TABS
325.0000 mg | ORAL_TABLET | ORAL | Status: DC
  Administered 2023-05-22 – 2023-05-24 (×2): 325 mg via ORAL
  Filled 2023-05-22 (×2): qty 1

## 2023-05-22 NOTE — Plan of Care (Signed)
  Problem: Metabolic: Goal: Ability to maintain appropriate glucose levels will improve Outcome: Progressing   Problem: Clinical Measurements: Goal: Respiratory complications will improve Outcome: Progressing Goal: Cardiovascular complication will be avoided Outcome: Progressing   Problem: Nutrition: Goal: Adequate nutrition will be maintained Outcome: Progressing   Problem: Safety: Goal: Ability to remain free from injury will improve Outcome: Progressing

## 2023-05-22 NOTE — Progress Notes (Signed)
 Mobility Specialist Progress Note:   05/22/23 1440  Orthostatic Lying   BP- Lying 138/86  Orthostatic Sitting  BP- Sitting 123/72  Orthostatic Standing at 0 minutes  BP- Standing at 0 minutes 107/50  Orthostatic Standing at 3 minutes  BP- Standing at 3 minutes 107/61  Mobility  Activity Ambulated with assistance in hallway  Level of Assistance Contact guard assist, steadying assist  Assistive Device Front wheel walker  Distance Ambulated (ft) 175 ft  Activity Response Tolerated well  Mobility Referral Yes  Mobility visit 1 Mobility  Mobility Specialist Start Time (ACUTE ONLY) 1420  Mobility Specialist Stop Time (ACUTE ONLY) 1440  Mobility Specialist Time Calculation (min) (ACUTE ONLY) 20 min   Pt received in bed, agreeable to mobility. Orthostatic vitals taken. See results above. Pt requesting to use BR before ambulation. Void successful. Pt denied any discomfort during session, asx throughout. Pt returned to bed with call bell in reach and all needs met. Family present.  Brown Husband  Mobility Specialist Please contact via [employer] office at [phone]

## 2023-05-22 NOTE — Progress Notes (Signed)
 Progress Note   Patient: Eric Harrington FMW:[id number] DOB: 05/16/1941 DOA: 05/20/2023     1 DOS: the patient was seen and examined on 05/22/2023 at 9:25AM      Brief hospital course: Mr. Escue is an 83 y.o. M with CLL, pAF not on [facility] since his neck surgery earlier this year, orthostatic hypotension on fluorinef and midodrine , CKD IIIa baseline 1.2, and DM who presented with chest discomfort, found to be in Afib with RVR.  12/30: Admitted and Cardiology consulted 12/31: No improvement with metoprolol , EP consulted, started amiodarone  1/1: Still in Afib     Assessment and Plan: * Paroxysmal atrial fibrillation (HCC) Amiodarone  started yesterday, still in A-fib today, still quite symptomatic with ambulation  - Continue Eliquis  - Stop aspirin  - Consult electrophysiology, appreciate cares - Continue low-dose metoprolol  - Continue amiodarone  infusion     Macrocytic anemia Iron deficiency Patient's hemoglobin has been trending down over the last year as his white count is trending.  He has had no clinical GI bleeding, history of diverticuli but no history of clinically significant GI bleeding  Ferritin low normal but Iron sat only ~10% - Continue Eliquis  - Check FOBT - Trend hemoglobin - Start ferrous sulfate   CLL (chronic lymphocytic leukemia) (HCC) Discussed with hematology.  Not currently on chemotherapy.  No contraindication to Eliquis .  White count trending up significantly this year, and at recent baseline around 100 K. - Continue Eliquis  -Outpatient follow-up with hematology - Trend CBC  Orthostatic hypotension Chronic orthostatic hypotension BP 120 lying to 87 standing today - Continue midodrine  and Florinef  - Start abdominal binder and ted hose  Thrombocytopenia (HCC) Platelet count 118, no clinical bleeding  Chronic kidney disease, stage 3a (HCC) Creatinine stable relative to baseline 1.2  Type 2 diabetes mellitus with complication, without long-term current  use of insulin  (HCC) Glucose controlled - Hold glimepiride  - Sliding scale corrections - Hold low-dose aspirin  - Continue Crestor           Subjective: Up to the bathroom but feeling out of breath and weak when he is walking around, just short distances in the room.  No chest pain.  No confusion.  Still dizzy with standing.  Started on amiodarone  last night by EP.   Physical Exam: BP 134/83 (BP Location: Right Arm)   Pulse (!) 107   Temp (!) 97.5 F (36.4 C) (Oral)   Resp 18   Ht 5' 9 (1.753 m)   Wt 72.9 kg   SpO2 96%   BMI 23.75 kg/m   Elderly adult male, lying bed, interactive and appropriate Tachycardic, irregular, no JVD Abdomen without distention or ascites Attention normal, affect pleasant, judgment and insight appear normal, moves upper extremities with generalized weakness but symmetric strength, speech fluent, face symmetric, oriented to person, place, time      Data Reviewed: Telemetry reviewed, has remained in atrial fibrillation with rates low 100s overnight Basic metabolic panel shows normal potassium, renal function stable, magnesium  2.2 Iron saturation 11% Folate and B12 normal Hemoglobin stable at 7.7, platelets stable at 106 White blood cell count 88K, slightly down from yesterday  Family Communication: Wife at the bedside    Disposition: Status is: Inpatient Patient with CLL, bad chronic orthostasis and paroxysmal atrial fibrillation who is here with significantly symptomatic atrial fibrillation  Electrophysiology are involved and trying to do beta rhythm control strategy versus rate control.  It is unclear if he will be symptom free while he remains in A-fib.  If not I am uncertain  how he will be discharged        Author: Lonni SHAUNNA Dalton, MD 05/22/2023 11:03 AM  For on call review [email or web address].

## 2023-05-22 NOTE — Progress Notes (Signed)
 MEWS Progress Note  Patient Details Name: Eric Harrington MRN: [id number] DOB: January 04, 1941 Today's Date: 05/22/2023   MEWS Flowsheet Documentation:  Assess: MEWS Score Temp: (!) 97.5 F (36.4 C) BP: 123/72 MAP (mmHg): 82 Pulse Rate: 96 ECG Heart Rate: (!) 111 Resp: 20 Level of Consciousness: Alert SpO2: 96 % O2 Device: Room Air Patient Activity (if Appropriate): In bed Assess: MEWS Score MEWS Temp: 0 MEWS Systolic: 0 MEWS Pulse: 2 MEWS RR: 0 MEWS LOC: 0 MEWS Score: 2 MEWS Score Color: Yellow Assess: SIRS CRITERIA SIRS Temperature : 0 SIRS Respirations : 0 SIRS Pulse: 1 SIRS WBC: 1 SIRS Score Sum : 2 SIRS Temperature : 0 SIRS Pulse: 1 SIRS Respirations : 0 SIRS WBC: 1 SIRS Score Sum : 2        [name] [name] 05/22/2023, 4:30 PM

## 2023-05-22 NOTE — Plan of Care (Signed)
 Will continue to monitor patient.

## 2023-05-22 NOTE — Progress Notes (Signed)
 Progress Note  Patient Name: Eric Harrington Date of Encounter: 05/22/2023  Primary Cardiologist: Gordy Bergamo, MD   Subjective   No significant change in symptoms today -- still symptomatic while up and ambulatory.  Inpatient Medications    Scheduled Meds:  apixaban   5 mg Oral BID   docusate sodium   100 mg Oral Daily   fludrocortisone   0.1 mg Oral Daily   insulin  aspart  0-9 Units Subcutaneous TID WC   loratadine   10 mg Oral Daily   metoprolol  tartrate  25 mg Oral BID   midodrine   10 mg Oral TID WC   pantoprazole   40 mg Oral Daily   rosuvastatin   5 mg Oral QPM   sodium chloride  flush  3 mL Intravenous Q12H   vitamin B-12  100 mcg Oral Daily   Continuous Infusions:  amiodarone  30 mg/hr (05/22/23 0557)   PRN Meds: acetaminophen  **OR** acetaminophen , albuterol , artificial tears, fluticasone , HYDROcodone -acetaminophen , ondansetron  (ZOFRAN ) IV   Vital Signs    Vitals:   05/21/23 1954 05/21/23 2028 05/22/23 0541 05/22/23 0748  BP: (!) 149/79 136/85 134/83   Pulse: 86 88 91 (!) 107  Resp: 16  18 18   Temp: 97.8 F (36.6 C)  (!) 97.5 F (36.4 C) (!) 97.5 F (36.4 C)  TempSrc: Oral  Oral Oral  SpO2: 96%  96%   Weight:      Height:        Intake/Output Summary (Last 24 hours) at 05/22/2023 0941 Last data filed at 05/22/2023 0618 Gross per 24 hour  Intake 394.01 ml  Output --  Net 394.01 ml   Filed Weights   05/20/23 1108 05/20/23 2207  Weight: 71.2 kg 72.9 kg    Telemetry    Ongoing atrial fibrillation - Personally Reviewed  ECG    05/21/23 - AF with RVR, RBBB, LAFB -- Personally Reviewed  Physical Exam   GEN: No acute distress.   Neck: No JVD Cardiac: iRRR, no murmurs, rubs, or gallops.  Respiratory: Breathing easily GI: Soft, nontender, non-distended  MS: No edema; No deformity. Neuro:  Nonfocal  Psych: Normal affect   Labs    Chemistry Recent Labs  Lab 05/20/23 1119 05/21/23 0449 05/22/23 0522  NA 137 136 135  K 4.6 4.6 4.6  CL 103 105 103   CO2 20* 23 25  GLUCOSE 154* 141* 160*  BUN 16 20 21   CREATININE 1.40* 1.27* 1.36*  CALCIUM  9.4 9.0 8.9  GFRNONAA 51* 57* 53*  ANIONGAP 14 8 7      Hematology Recent Labs  Lab 05/20/23 1119 05/21/23 0449 05/22/23 0522  WBC 82.3* 101.5* 88.9*  RBC 2.86* 2.46* 2.47*  HGB 9.1* 7.9* 7.7*  HCT 29.2* 25.6* 25.1*  MCV 102.1* 104.1* 101.6*  MCH 31.8 32.1 31.2  MCHC 31.2 30.9 30.7  RDW 14.8 15.2 15.3  PLT 138* 118* 106*    Cardiac EnzymesNo results for input(s): TROPONINI in the last 168 hours. No results for input(s): TROPIPOC in the last 168 hours.   BNPNo results for input(s): BNP, PROBNP in the last 168 hours.   DDimer No results for input(s): DDIMER in the last 168 hours.   Summary of Pertinent studies    TTE: 05/20/2023 EF 55-60%. Normal biatrial size   Patient Profile     83 y.o. male with CLL, pAF, orthostatic hypotension on fluorinef and midodrine , CKD, DM admitted with symptomatic AF with RVR.  Assessment & Plan    AF with RVR He is highly symptomatic with fatigue  and shortness of breath coinciding with onset of AF 12/30 AM Anticoagulation was started within hours of onset of AF He is not a candidate for TEE due to recent cervical spine surgery Given his degree of symptoms and rapidity of rates, I do not think he will well-tolerate ongoing AF Rate control options are limited due to hypotension Because anticoagulation was started within just a few hours of AF onset, I suspect risk of stroke is low with starting amiodarone  - which may result in chemical cardioversion, and is more acceptable than ongoing RVR or escalation of other rate control medications that are more prone to cause hypotension. I discussed this with the patient and his daughter and they are in agreement Continue IV amiodarone  load. At the 24 hour mark, when the IV amiodarone  load is completed, switch to PO amiodarone  200 mg TID Continue betablocker for now, though he has not tolerated  this medication historically. Will plan to DC this when he has either converted to sinus or rate is controlled with amiodarone  (which will take some time)   Secondary hypercoagulable state CHADS2Vasc is 8 Continue eliquis  5 BID   Orthostatic hypotension On midodrine  and fludricortisone Not a good candidate for CCB or betablocker    CLL WBC > 100  Anemia Suspect this is due to CLL No overt bleeding prior to or after Eliquis  Evaluation per primary team    For questions or updates, please contact [facility] Please consult [email or web address] for contact info under Cardiology/STEMI.      Signed, Eulas FORBES Furbish, MD 05/22/2023, 9:41 AM

## 2023-05-23 ENCOUNTER — Telehealth (HOSPITAL_COMMUNITY): Payer: Self-pay | Admitting: Pharmacy Technician

## 2023-05-23 ENCOUNTER — Other Ambulatory Visit (HOSPITAL_COMMUNITY): Payer: Self-pay

## 2023-05-23 DIAGNOSIS — I480 Paroxysmal atrial fibrillation: Secondary | ICD-10-CM | POA: Diagnosis not present

## 2023-05-23 LAB — BASIC METABOLIC PANEL
Anion gap: 12 (ref 5–15)
BUN: 19 mg/dL (ref 8–23)
CO2: 21 mmol/L — ABNORMAL LOW (ref 22–32)
Calcium: 9.1 mg/dL (ref 8.9–10.3)
Chloride: 101 mmol/L (ref 98–111)
Creatinine, Ser: 1.36 mg/dL — ABNORMAL HIGH (ref 0.61–1.24)
GFR, Estimated: 53 mL/min — ABNORMAL LOW (ref >60)
Glucose, Bld: 176 mg/dL — ABNORMAL HIGH (ref 70–99)
Potassium: 4.5 mmol/L (ref 3.5–5.1)
Sodium: 134 mmol/L — ABNORMAL LOW (ref 135–145)

## 2023-05-23 LAB — CBC WITH DIFFERENTIAL/PLATELET
Abs Immature Granulocytes: 0 10*3/uL (ref 0.00–0.07)
Basophils Absolute: 0 10*3/uL (ref 0.0–0.1)
Basophils Relative: 0 %
Eosinophils Absolute: 0 10*3/uL (ref 0.0–0.5)
Eosinophils Relative: 0 %
HCT: 25.4 % — ABNORMAL LOW (ref 39.0–52.0)
Hemoglobin: 8.1 g/dL — ABNORMAL LOW (ref 13.0–17.0)
Lymphocytes Relative: 100 %
Lymphs Abs: 90.1 10*3/uL — ABNORMAL HIGH (ref 0.7–4.0)
MCH: 32.1 pg (ref 26.0–34.0)
MCHC: 31.9 g/dL (ref 30.0–36.0)
MCV: 100.8 fL — ABNORMAL HIGH (ref 80.0–100.0)
Monocytes Absolute: 0 10*3/uL — ABNORMAL LOW (ref 0.1–1.0)
Monocytes Relative: 0 %
Neutro Abs: 0 10*3/uL — CL (ref 1.7–7.7)
Neutrophils Relative %: 0 %
Platelets: 122 10*3/uL — ABNORMAL LOW (ref 150–400)
RBC: 2.52 MIL/uL — ABNORMAL LOW (ref 4.22–5.81)
RDW: 15.1 % (ref 11.5–15.5)
WBC: 90.1 10*3/uL (ref 4.0–10.5)
nRBC: 0 % (ref 0.0–0.2)
nRBC: 0 /100{WBCs}

## 2023-05-23 LAB — GLUCOSE, CAPILLARY
Glucose-Capillary: 230 mg/dL — ABNORMAL HIGH (ref 70–99)
Glucose-Capillary: 149 mg/dL — ABNORMAL HIGH (ref 70–99)
Glucose-Capillary: 171 mg/dL — ABNORMAL HIGH (ref 70–99)
Glucose-Capillary: 142 mg/dL — ABNORMAL HIGH (ref 70–99)

## 2023-05-23 MED ORDER — AMIODARONE HCL 200 MG PO TABS
200.0000 mg | ORAL_TABLET | Freq: Two times a day (BID) | ORAL | Status: DC
  Administered 2023-05-23 – 2023-05-24 (×3): 200 mg via ORAL
  Filled 2023-05-23 (×3): qty 1

## 2023-05-23 MED ORDER — INSULIN GLARGINE-YFGN 100 UNIT/ML ~~LOC~~ SOLN
10.0000 [IU] | Freq: Every day | SUBCUTANEOUS | Status: DC
  Administered 2023-05-23 – 2023-05-24 (×2): 10 [IU] via SUBCUTANEOUS
  Filled 2023-05-23 (×2): qty 0.1

## 2023-05-23 NOTE — Telephone Encounter (Signed)
 Patient Product/process Development Scientist completed.    The patient is insured through [facility]. Patient has Medicare and is not eligible for a copay card, but may be able to apply for patient assistance, if available.    Ran test claim for Eliquis  5 mg and the current 30 day co-pay is $340.00 due to a $300.00 deductible.  Will be $40.00 once deductible is met.   This test claim was processed through [facility]- copay amounts may vary at other pharmacies due to pharmacy/plan contracts, or as the patient moves through the different stages of their insurance plan.     Reyes Sharps, CPHT Pharmacy Technician III Certified Patient Advocate [facility] Patient Advocate Team Direct Number: [phone]  Fax: [phone]

## 2023-05-23 NOTE — Progress Notes (Signed)
 PROGRESS NOTE    Eric Harrington  FMW:[id number] DOB: Jun 06, 1940 DOA: 05/20/2023 PCP: Rolinda Millman, MD   Brief Narrative:  Eric Harrington is an 83 y.o. M with CLL, pAF not on [facility] since his neck surgery earlier this year, orthostatic hypotension on fluorinef and midodrine , CKD IIIa baseline 1.2, and DM who presented with chest discomfort, found to be in Afib with RVR.   Assessment & Plan:   Principal Problem:   Paroxysmal atrial fibrillation (HCC) Active Problems:   Macrocytic anemia   CLL (chronic lymphocytic leukemia) (HCC)   Orthostatic hypotension   Type 2 diabetes mellitus with complication, without long-term current use of insulin  (HCC)   Chronic kidney disease, stage 3a (HCC)   Thrombocytopenia (HCC)  Paroxysmal atrial fibrillation (HCC) Amiodarone  started 05/20/2024.  Converted to sinus rhythm around 6 PM 05/22/2023.  Cardiology and EP has seen him.  Since he converted, EP signed off.  They have transitioned him to oral amiodarone .  Per patient and family, cardiology has told them that they would like to monitor him another day at least in the hospital.  Appreciate cardiology help and management per them.     Macrocytic anemia Iron deficiency Patient's hemoglobin has been trending down over the last year as his white count is trending up.  He has had no clinical GI bleeding, history of diverticuli but no history of clinically significant GI bleeding.  Hemoglobin is still over 7 but CBC from today is pending.  Will transfuse if hemoglobin less than 7.  CLL (chronic lymphocytic leukemia) (HCC) Discussed with hematology.  Not currently on chemotherapy.  No contraindication to Eliquis .  White count trending up significantly this year, and at recent baseline around 100 K. - Continue Eliquis  -Outpatient follow-up with hematology - Trend CBC   Orthostatic hypotension Chronic orthostatic hypotension BP 120 lying to 87 standing today - Continue midodrine  and Florinef  - Start abdominal  binder and ted hose   Thrombocytopenia (HCC) Slight drop.  No bleeding.   Chronic kidney disease, stage 3a (HCC) Creatinine stable relative to baseline 1.2   Type 2 diabetes mellitus with complication, without long-term current use of insulin  (HCC) - Hold glimepiride  - Sliding scale corrections but blood sugar still elevated so we will add Semglee  10 units. - Hold low-dose aspirin  - Continue Crestor     DVT prophylaxis: Place TED hose Start: 05/22/23 1102   Code Status: Full Code  Family Communication: Wife present at bedside.  Plan of care discussed with patient in length and he/she verbalized understanding and agreed with it.  Status is: Inpatient Remains inpatient appropriate because: Per cardiology   Estimated body mass index is 23.75 kg/m as calculated from the following:   Height as of this encounter: 5' 9 (1.753 m).   Weight as of this encounter: 72.9 kg.    Nutritional Assessment: Body mass index is 23.75 kg/m.SABRA Seen by dietician.  I agree with the assessment and plan as outlined below: Nutrition Status:        . Skin Assessment: I have examined the patient's skin and I agree with the wound assessment as performed by the wound care RN as outlined below:    Consultants:  Cardiology and EP  Procedures:  None  Antimicrobials:  Anti-infectives (From admission, onward)    None         Subjective: Patient examined.  He says that he quickly realized the time he was converted to sinus rhythm because his symptoms of shortness of breath abruptly improved and he  has been feeling well since then and currently has no complaints at all.  Objective: Vitals:   05/22/23 1942 05/22/23 2301 05/23/23 0239 05/23/23 0816  BP: (!) 174/70 (!) 158/75 131/64 127/61  Pulse: 63 61 (!) 59 64  Resp: 16 16 16 20   Temp: 98.5 F (36.9 C) 97.9 F (36.6 C) (!) 97.4 F (36.3 C) 98.7 F (37.1 C)  TempSrc: Oral Oral Oral Oral  SpO2: 96% 98% 95% 96%  Weight:      Height:         Intake/Output Summary (Last 24 hours) at 05/23/2023 1100 Last data filed at 05/22/2023 2200 Gross per 24 hour  Intake 240 ml  Output --  Net 240 ml   Filed Weights   05/20/23 1108 05/20/23 2207  Weight: 71.2 kg 72.9 kg    Examination:  General exam: Appears calm and comfortable  Respiratory system: Clear to auscultation. Respiratory effort normal. Cardiovascular system: S1 & S2 heard, RRR. No JVD, murmurs, rubs, gallops or clicks. No pedal edema. Gastrointestinal system: Abdomen is nondistended, soft and nontender. No organomegaly or masses felt. Normal bowel sounds heard. Central nervous system: Alert and oriented. No focal neurological deficits. Extremities: Symmetric 5 x 5 power. Skin: No rashes, lesions or ulcers Psychiatry: Judgement and insight appear normal. Mood & affect appropriate.    Data Reviewed: I have personally reviewed following labs and imaging studies  CBC: Recent Labs  Lab 05/20/23 1119 05/21/23 0449 05/22/23 0522  WBC 82.3* 101.5* 88.9*  HGB 9.1* 7.9* 7.7*  HCT 29.2* 25.6* 25.1*  MCV 102.1* 104.1* 101.6*  PLT 138* 118* 106*   Basic Metabolic Panel: Recent Labs  Lab 05/20/23 1119 05/21/23 0449 05/21/23 0836 05/22/23 0522  NA 137 136  --  135  K 4.6 4.6  --  4.6  CL 103 105  --  103  CO2 20* 23  --  25  GLUCOSE 154* 141*  --  160*  BUN 16 20  --  21  CREATININE 1.40* 1.27*  --  1.36*  CALCIUM  9.4 9.0  --  8.9  MG 1.6*  --  2.2  --    GFR: Estimated Creatinine Clearance: 43.3 mL/min (A) (by C-G formula based on SCr of 1.36 mg/dL (H)). Liver Function Tests: No results for input(s): AST, ALT, ALKPHOS, BILITOT, PROT, ALBUMIN in the last 168 hours. No results for input(s): LIPASE, AMYLASE in the last 168 hours. No results for input(s): AMMONIA in the last 168 hours. Coagulation Profile: No results for input(s): INR, PROTIME in the last 168 hours. Cardiac Enzymes: No results for input(s): CKTOTAL, CKMB,  CKMBINDEX, TROPONINI in the last 168 hours. BNP (last 3 results) No results for input(s): PROBNP in the last 8760 hours. HbA1C: No results for input(s): HGBA1C in the last 72 hours. CBG: Recent Labs  Lab 05/22/23 0800 05/22/23 1129 05/22/23 1627 05/22/23 2104 05/23/23 0823  GLUCAP 190* 208* 197* 180* 230*   Lipid Profile: No results for input(s): CHOL, HDL, LDLCALC, TRIG, CHOLHDL, LDLDIRECT in the last 72 hours. Thyroid  Function Tests: Recent Labs    05/20/23 1119  TSH 0.542   Anemia Panel: Recent Labs    05/22/23 0522  VITAMINB12 491  FOLATE 16.7  FERRITIN 142  TIBC 323  IRON 35*   Sepsis Labs: No results for input(s): PROCALCITON, LATICACIDVEN in the last 168 hours.  No results found for this or any previous visit (from the past 240 hours).   Radiology Studies: No results found.  Scheduled Meds:  amiodarone   200 mg Oral BID   apixaban   5 mg Oral BID   docusate sodium   100 mg Oral Daily   ferrous sulfate   325 mg Oral QODAY   fludrocortisone   0.1 mg Oral Daily   insulin  aspart  0-9 Units Subcutaneous TID WC   insulin  glargine-yfgn  10 Units Subcutaneous Daily   loratadine   10 mg Oral Daily   midodrine   10 mg Oral TID WC   pantoprazole   40 mg Oral Daily   polyethylene glycol  17 g Oral Daily   rosuvastatin   5 mg Oral QPM   sodium chloride  flush  3 mL Intravenous Q12H   vitamin B-12  100 mcg Oral Daily   Continuous Infusions:  amiodarone  30 mg/hr (05/23/23 0455)     LOS: 2 days   Fredia Skeeter, MD [facility]  05/23/2023, 11:00 AM   *Please note that this is a verbal dictation therefore any spelling or grammatical errors are due to the Dragon Medical One system interpretation.  Please page via Amion and do not message via secure chat for urgent patient care matters. Secure chat can be used for non urgent patient care matters.  How to contact the [facility] Attending or Consulting provider 7A - 7P or covering provider during  after hours 7P -7A, for this patient?  Check the care team in [facility] and look for a) attending/consulting [facility] provider listed and b) the [facility] team listed. Page or secure chat 7A-7P. Log into [email or web address] and use [facility] universal password to access. If you do not have the password, please contact the hospital operator. Locate the [facility] provider you are looking for under [facility] and page to a number that you can be directly reached. If you still have difficulty reaching the provider, please page the [facility] (Director on Call) for the Hospitalists listed on amion for assistance.

## 2023-05-23 NOTE — Progress Notes (Signed)
  Patient Name: Eric Harrington Date of Encounter: 05/23/2023  Primary Cardiologist: Gordy Bergamo, MD Electrophysiologist: None  Interval Summary   Feels much better this morning, knew that he had converted to sinus rhythm  Vital Signs    Vitals:   05/22/23 1619 05/22/23 1942 05/22/23 2301 05/23/23 0239  BP: (!) 152/85 (!) 174/70 (!) 158/75 131/64  Pulse: 89 63 61 (!) 59  Resp: 18 16 16 16   Temp: (!) 97.4 F (36.3 C) 98.5 F (36.9 C) 97.9 F (36.6 C) (!) 97.4 F (36.3 C)  TempSrc: Oral Oral Oral Oral  SpO2: 97% 96% 98% 95%  Weight:      Height:        Intake/Output Summary (Last 24 hours) at 05/23/2023 0744 Last data filed at 05/22/2023 2200 Gross per 24 hour  Intake 240 ml  Output --  Net 240 ml   Filed Weights   05/20/23 1108 05/20/23 2207  Weight: 71.2 kg 72.9 kg    Physical Exam    GEN- The patient is well appearing, alert and oriented x 3 today.   Lungs- Clear to ausculation bilaterally, normal work of breathing Cardiac- Regular rate and rhythm, no murmurs, rubs or gallops GI- soft, NT, ND, + BS Extremities- no clubbing or cyanosis. No edema  Telemetry    SR, converted about 1745pm yesterday  (personally reviewed)  Hospital Course    Eric Harrington is a 83 y.o. male with PMH of CLL, parox AFib, ILR in situ, hypotension on fluorinef and midodrine , CKD, T2DM, recent cervical fusion (04/2023) admitted for SOB found to be in Afib w RVR  Assessment & Plan    #) Parox AF w RVR #) ILR in situ #) orthostatic hypotension Highly symptomatic when in afib IV amiodarone  started and has converted to sinus rhythm Stop BB Will convert amiodarone  to PO - 200mg  BID, ok to start this AM with IV amiodarone  still running   #) secondary hypercoag d/t afib ChadsVasc is at least 5 Continue 5mg  eliquis  BID    EP will sign off at this time, outpatient follow-up scheduled with AFib clinic       For questions or updates, please contact [facility] Please consult  [email or web address] for contact info under Cardiology/STEMI.  Signed, Chantal Needle, NP  05/23/2023, 7:44 AM

## 2023-05-23 NOTE — Progress Notes (Signed)
 Mobility Specialist Progress Note:   05/23/23 1100  Orthostatic Lying   BP- Lying 148/79  Orthostatic Sitting  BP- Sitting 126/71  Orthostatic Standing at 0 minutes  BP- Standing at 0 minutes 126/59  Mobility  Activity Ambulated with assistance in hallway  Level of Assistance Contact guard assist, steadying assist  Assistive Device Front wheel walker  Distance Ambulated (ft) 215 ft  Activity Response Tolerated well  Mobility Referral Yes  Mobility visit 1 Mobility  Mobility Specialist Start Time (ACUTE ONLY) 1017  Mobility Specialist Stop Time (ACUTE ONLY) 1043  Mobility Specialist Time Calculation (min) (ACUTE ONLY) 26 min   Pt received in bed, eager to mobility. Orthostatic vitals taken, w/ no complaints. Asymptomatic throughout. Pt left in chair with call bell and family present. RN aware.  D'Vante Nicholaus Mobility Specialist Please contact via Special Educational Needs Teacher or Rehab office at [phone]

## 2023-05-23 NOTE — Progress Notes (Addendum)
 Patient Name: Eric Harrington Date of Encounter: 05/23/2023 [facility] Cardiologist: Gordy Bergamo, MD   Interval Summary  .    Patient resting comfortably in bed. He states that was aware of his conversion to NSR last night. He reports that all of his symptoms he was having with exertion have subsided since conversion. He has gotten up to go to the bathroom and ambulated in the hall yesterday with no reportable symptoms. He states he is feeling much better. He denies any chest pain, shortness of breath, lightheadedness, dizziness or palpitations. His resting HR when I was in the room was 61-68 bpm and he remained in normal sinus.  Vital Signs .    Vitals:   05/22/23 1942 05/22/23 2301 05/23/23 0239 05/23/23 0816  BP: (!) 174/70 (!) 158/75 131/64 127/61  Pulse: 63 61 (!) 59 64  Resp: 16 16 16 20   Temp: 98.5 F (36.9 C) 97.9 F (36.6 C) (!) 97.4 F (36.3 C) 98.7 F (37.1 C)  TempSrc: Oral Oral Oral Oral  SpO2: 96% 98% 95% 96%  Weight:      Height:        Intake/Output Summary (Last 24 hours) at 05/23/2023 0912 Last data filed at 05/22/2023 2200 Gross per 24 hour  Intake 240 ml  Output --  Net 240 ml      05/20/2023   10:07 PM 05/20/2023   11:08 AM 05/01/2023    6:55 AM  Last 3 Weights  Weight (lbs) 160 lb 12.8 oz 157 lb 168 lb  Weight (kg) 72.938 kg 71.215 kg 76.204 kg      Telemetry/ECG    Sinus rhythm, HR 62 bpm - Personally Reviewed  Physical Exam .   GEN: No acute distress.   Neck: No JVD Cardiac: RRR, no murmurs  Respiratory: Clear to auscultation bilaterally. GI: Soft, nontender, non-distended  MS: No edema  Assessment & Plan .    Paroxysmal atrial fibrillation with RVR, converted back to NSR Implanted loop recorder in situ Patient converted to NSR around 5:45pm 05/22/2023. Since conversion his symptoms have vastly improved and he was able to ambulate down the hall yesterday without any issues. He says he knows when he converted.  Recent HR readings  are from 59-89 bpm No longer on beta-blocker -- Finishing IV amiodarone   -- Start amiodarone  PO 200 mg BID this morning per EP  -- EP has signed off on this patient -- He will follow up outpatient with Afib clinic  Secondary hypercoagulable state Patient has a CHADSVASc score of 6. Loop recorder showed episode of Afib that started 8:49AM on 05/20/23 Echo from 05/20/23 showed LVEF 55-60%, normal LV function and size, right and left atria normal size. -- Continue Eliquis  5 mg BID  -- Patient states he has been on Eliquis  in the past and does not recall any issues with it -- Discussed the potential placement of Watchman device in future if oral anticoagulation is not well tolerated due to underlying CLL/anemia   Orthostatic hypotension Most recent BP reading was 127/61 -- Continue midodrine  10 mg TID and fludricortisone 0.1 mg daily per primary team  CLL No contraindication to Eliquis  per hematology  Most recent WBC 88.9k Patient has not on any treatment currently  -- Continue to monitor CBC while in hospital -- Outpatient follow up with hematology   Anemia  No known GI bleeds, no history of diverticuli or significant known cause of bleeding Patient denies any fatigue or shortness of breath since converting  to NSR. Most recent hemoglobin 7.7, still trending down -- Continue to monitor CBC -- Treatment per primary team   CKD IIIa T2DM Hyperlipidemia GERD -- Treatment per primary team   For questions or updates, please contact [facility] Please consult [email or web address] for contact info under       Signed, Waddell DELENA Donath, PA-C   Patient seen and examined with TP PA-C.  Agree as above, with the following exceptions and changes as noted below. Feels well with no concerning symptoms, back in SR. Gen: NAD, CV: RRR, no murmurs, Lungs: clear, Abd: soft, Extrem: Warm, well perfused, no edema, Neuro/Psych: alert and oriented x 3, normal mood and affect. All available labs,  radiology testing, previous records reviewed. Hb stable today on eliquis , back in SR and on oral amiodarone .  If Hb stable tomorrow am, no further inpt CV recommendations. He has an appt in Afib clinic 06/24/23. Will try to make appt with his regular cardiologist as well per family request.   Soyla DELENA Merck, MD 05/23/23 1:20 PM

## 2023-05-24 ENCOUNTER — Other Ambulatory Visit (HOSPITAL_COMMUNITY): Payer: Self-pay

## 2023-05-24 DIAGNOSIS — I480 Paroxysmal atrial fibrillation: Secondary | ICD-10-CM | POA: Diagnosis not present

## 2023-05-24 LAB — GLUCOSE, CAPILLARY
Glucose-Capillary: 123 mg/dL — ABNORMAL HIGH (ref 70–99)
Glucose-Capillary: 156 mg/dL — ABNORMAL HIGH (ref 70–99)

## 2023-05-24 LAB — COMPREHENSIVE METABOLIC PANEL
ALT: 13 U/L (ref 0–44)
AST: 18 U/L (ref 15–41)
Albumin: 3.4 g/dL — ABNORMAL LOW (ref 3.5–5.0)
Alkaline Phosphatase: 102 U/L (ref 38–126)
Anion gap: 8 (ref 5–15)
BUN: 14 mg/dL (ref 8–23)
CO2: 24 mmol/L (ref 22–32)
Calcium: 9 mg/dL (ref 8.9–10.3)
Chloride: 103 mmol/L (ref 98–111)
Creatinine, Ser: 1.39 mg/dL — ABNORMAL HIGH (ref 0.61–1.24)
GFR, Estimated: 51 mL/min — ABNORMAL LOW (ref >60)
Glucose, Bld: 218 mg/dL — ABNORMAL HIGH (ref 70–99)
Potassium: 4.2 mmol/L (ref 3.5–5.1)
Sodium: 135 mmol/L (ref 135–145)
Total Bilirubin: 0.4 mg/dL (ref 0.0–1.2)
Total Protein: 5.7 g/dL — ABNORMAL LOW (ref 6.5–8.1)

## 2023-05-24 LAB — CBC WITH DIFFERENTIAL/PLATELET
Abs Immature Granulocytes: 0.03 10*3/uL (ref 0.00–0.07)
Basophils Absolute: 0.3 10*3/uL — ABNORMAL HIGH (ref 0.0–0.1)
Basophils Relative: 0 %
Eosinophils Absolute: 0 10*3/uL (ref 0.0–0.5)
Eosinophils Relative: 0 %
HCT: 25.5 % — ABNORMAL LOW (ref 39.0–52.0)
Hemoglobin: 8.2 g/dL — ABNORMAL LOW (ref 13.0–17.0)
Immature Granulocytes: 0 %
Lymphocytes Relative: 85 %
Lymphs Abs: 65 10*3/uL — ABNORMAL HIGH (ref 0.7–4.0)
MCH: 32.3 pg (ref 26.0–34.0)
MCHC: 32.2 g/dL (ref 30.0–36.0)
MCV: 100.4 fL — ABNORMAL HIGH (ref 80.0–100.0)
Monocytes Absolute: 9.5 10*3/uL — ABNORMAL HIGH (ref 0.1–1.0)
Monocytes Relative: 13 %
Neutro Abs: 1.1 10*3/uL — ABNORMAL LOW (ref 1.7–7.7)
Neutrophils Relative %: 2 %
Platelets: 105 10*3/uL — ABNORMAL LOW (ref 150–400)
RBC: 2.54 MIL/uL — ABNORMAL LOW (ref 4.22–5.81)
RDW: 15.1 % (ref 11.5–15.5)
Smear Review: NORMAL
WBC: 75.8 10*3/uL (ref 4.0–10.5)
nRBC: 0 % (ref 0.0–0.2)

## 2023-05-24 MED ORDER — FERROUS SULFATE 325 (65 FE) MG PO TABS
325.0000 mg | ORAL_TABLET | ORAL | 0 refills | Status: DC
  Filled 2023-05-24: qty 15, 30d supply, fill #0

## 2023-05-24 MED ORDER — APIXABAN 5 MG PO TABS
5.0000 mg | ORAL_TABLET | Freq: Two times a day (BID) | ORAL | 0 refills | Status: DC
  Filled 2023-05-24: qty 60, 30d supply, fill #0

## 2023-05-24 MED ORDER — AMIODARONE HCL 200 MG PO TABS
200.0000 mg | ORAL_TABLET | Freq: Two times a day (BID) | ORAL | 0 refills | Status: DC
  Filled 2023-05-24: qty 60, 30d supply, fill #0

## 2023-05-24 NOTE — TOC CM/SW Note (Signed)
 Transition of Care [facility]) - Inpatient Brief Assessment   Patient Details  Name: Eric Harrington MRN: [id number] Date of Birth: Jan 17, 1941  [facility] CM/SW Contact:    Sudie Erminio Deems, RN Phone Number: 05/24/2023, 12:55 PM   Clinical Narrative: Patient presented for tachycardia-PAF. Patient has PCP and insurance. No home needs identified at this time.    Transition of Care Asessment: Insurance and Status: Insurance coverage has been reviewed Patient has primary care physician: Yes Prior/Current Home Services: No current home services Social Drivers of Health Review: SDOH reviewed no interventions necessary Readmission risk has been reviewed: Yes Transition of care needs: no transition of care needs at this time

## 2023-05-24 NOTE — Plan of Care (Signed)
  Problem: Metabolic: Goal: Ability to maintain appropriate glucose levels will improve Outcome: Progressing   Problem: Clinical Measurements: Goal: Ability to maintain clinical measurements within normal limits will improve Outcome: Progressing Goal: Respiratory complications will improve Outcome: Progressing Goal: Cardiovascular complication will be avoided Outcome: Progressing   Problem: Safety: Goal: Ability to remain free from injury will improve Outcome: Progressing

## 2023-05-24 NOTE — Progress Notes (Signed)
 Patient Name: Eric Harrington Date of Encounter: 05/24/2023 [facility] Cardiologist: Gordy Bergamo, MD   Interval Summary  .    Patient resting comfortably in bed with wife, daughter and grandson at bedside. Patient reports feeling well since converting back to NSR. He denies any chest pain, palpitations, shortness of breath, fatigue,or dizziness at rest or with ambulation. Spent some time discussing all of the medications he will need to continue at discharge along with the risks and benefits of each. While I was in there phlebotomy was in drawing labs. Awaiting the results of CBC to determine if cardiology is signing off.    Vital Signs .    Vitals:   05/23/23 1953 05/24/23 0427 05/24/23 0707 05/24/23 0900  BP: (!) 161/73 (!) 156/70  139/72  Pulse: (!) 58 62  65  Resp:  16 20 18   Temp: 98.2 F (36.8 C) 97.8 F (36.6 C)  97.6 F (36.4 C)  TempSrc: Oral Oral  Oral  SpO2: 97% 93%  95%  Weight:      Height:        Intake/Output Summary (Last 24 hours) at 05/24/2023 1042 Last data filed at 05/24/2023 0906 Gross per 24 hour  Intake 400 ml  Output --  Net 400 ml      05/20/2023   10:07 PM 05/20/2023   11:08 AM 05/01/2023    6:55 AM  Last 3 Weights  Weight (lbs) 160 lb 12.8 oz 157 lb 168 lb  Weight (kg) 72.938 kg 71.215 kg 76.204 kg     Telemetry/ECG    Normal sinus rhythm, episodes of bradycardia, current HR 62 bpm  - Personally Reviewed  Physical Exam .   GEN: No acute distress.   Neck: No JVD Cardiac: RRR, no murmurs.  Respiratory: Clear to auscultation bilaterally. GI: Soft, nontender, non-distended  MS: No edema  Assessment & Plan .     Paroxysmal atrial fibrillation with RVR, converted back to NSR Implanted loop recorder in situ Patient converted to NSR around 5:45pm 05/22/2023. Since conversion his symptoms have vastly improved and he was able to ambulate down the hall yesterday without any issues. He says he knows when he converted.  Recent HR readings  are from 59-65 bpm -- Continue amiodarone  PO 200 mg BID -- Continue Eliquis  5 mg BID -- He will follow up outpatient with Dr. Bergamo 06/10/23 and with Afib clinic 06/24/2023   Secondary hypercoagulable state Patient has a CHADSVASc score of 6. Loop recorder showed episode of Afib that started 8:49AM on 05/20/23 Echo from 05/20/23 showed LVEF 55-60%, normal LV function and size, right and left atria normal size. -- Continue Eliquis  5 mg BID  -- Patient states he has been on Eliquis  in the past and does not recall any issues with it -- Discussed the potential placement of Watchman device in future if oral anticoagulation is not well tolerated due to underlying CLL/anemia    Orthostatic hypotension Most recent BP reading was 127/61 -- Continue midodrine  10 mg TID and fludricortisone 0.1 mg daily per primary team   CLL No contraindication to Eliquis  per hematology  Most recent WBC 90.1k Patient has not on any treatment currently  -- Continue to monitor CBC while in hospital -- Recommend outpatient follow up with hematology    Anemia  No known GI bleeds, no history of diverticuli or significant known cause of bleeding Patient denies any fatigue or shortness of breath since converting to NSR. Most recent hemoglobin 8.1, up from  day prior -- Continue to monitor CBC, if hemoglobin is stable this morning cardiology will sign off  -- Treatment per primary team   Per primary team  CKD IIIa T2DM Hyperlipidemia GERD  For questions or updates, please contact [facility] Please consult [email or web address] for contact info under   Signed, Waddell DELENA Donath, PA-C

## 2023-05-24 NOTE — Discharge Summary (Signed)
 Physician Discharge Summary  Eric Harrington FMW:[id number] DOB: 03-16-1941 DOA: 05/20/2023  PCP: Rolinda Millman, MD  Admit date: 05/20/2023 Discharge date: 05/24/2023 30 Day Unplanned Readmission Risk Score    Flowsheet Row ED to Hosp-Admission (Current) from 05/20/2023 in [facility]  30 Day Unplanned Readmission Risk Score (%) 14.85 Filed at 05/24/2023 1200       This score is the patient's risk of an unplanned readmission within 30 days of being discharged (0 -100%). The score is based on dignosis, age, lab data, medications, orders, and past utilization.   Low:  0-14.9   Medium: 15-21.9   High: 22-29.9   Extreme: 30 and above          Admitted From: Home Disposition: Home  Recommendations for Outpatient Follow-up:  Follow up with PCP in 1-2 weeks Please obtain BMP/CBC in one week follow up outpatient with Dr. Ladona 06/10/23 and with Afib clinic 06/24/2023  Follow-up with your oncologist per scheduled Please follow up with your PCP on the following pending results: Unresulted Labs (From admission, onward)     Start     Ordered   05/24/23 1050  CBC with Differential/Platelet  Once,   R       Question:  Specimen collection method  Answer:  Lab=Lab collect   05/24/23 1049   05/21/23 0821  Occult blood card to lab, stool  Once,   R        05/21/23 0820              Home Health: None Equipment/Devices: None  Discharge Condition: Stable CODE STATUS: Full code Diet recommendation: Cardiac  Subjective: Seen and examined.  No complaints.  Daughter and wife at the bedside.  He is eager to go home.  Brief/Interim Summary: Eric Harrington is an 83 y.o. M with CLL, pAF not on AC since his neck surgery earlier this year, orthostatic hypotension on fluorinef and midodrine , CKD IIIa baseline 1.2, and DM who presented with chest discomfort, found to be in Afib with RVR.  Admitted under hospital service, cardiology consulted.  Details below.   Paroxysmal atrial  fibrillation (HCC) Amiodarone  started 05/20/2024.  Converted to sinus rhythm around 6 PM 05/22/2023.  Cardiology and EP has seen him.  Since he converted, his symptoms improved, EP signed off.  They have transitioned him to oral amiodarone .  Patient was also started on Eliquis  this hospitalization, he has been cleared by cardiology, he is asymptomatic and he is going home today. He will follow up outpatient with Dr. Ladona 06/10/23 and with Afib clinic 06/24/2023.   Macrocytic anemia Iron deficiency Patient's hemoglobin has been trending down over the last year as his white count is trending up.  He has had no clinical GI bleeding, history of diverticuli but no history of clinically significant GI bleeding.  Globin was hovering between 7.7-8.2.  Stable for last couple of days.   CLL (chronic lymphocytic leukemia) (HCC) Discussed with hematology.  Not currently on chemotherapy.  No contraindication to Eliquis .  White count trending up significantly this year, and at recent baseline around 100 K but today. - Continue Eliquis  -Outpatient follow-up with hematology - Trend CBC   Orthostatic hypotension Chronic orthostatic hypotension BP 120 lying to 87 standing today - Continue midodrine  and Florinef  - Started abdominal binder and ted hose while inpatient.   Thrombocytopenia (HCC) Slight drop.  No bleeding.   Chronic kidney disease, stage 3a (HCC) Creatinine stable relative to baseline 1.2   Type 2  diabetes mellitus with complication, without long-term current use of insulin  (HCC)/hyperlipidemia Resume home medications - Continue Crestor   Discharge plan was discussed with patient and/or family member and they verbalized understanding and agreed with it.  Discharge Diagnoses:  Principal Problem:   Paroxysmal atrial fibrillation (HCC) Active Problems:   Macrocytic anemia   CLL (chronic lymphocytic leukemia) (HCC)   Orthostatic hypotension   Type 2 diabetes mellitus with complication, without  long-term current use of insulin  (HCC)   Chronic kidney disease, stage 3a (HCC)   Thrombocytopenia (HCC)    Discharge Instructions  Discharge Instructions     Amb referral to AFIB Clinic   Complete by: As directed       Allergies as of 05/24/2023       Reactions   Norvasc [amlodipine Besylate] Rash   Fatigue  Fever  Chills    Augmentin [amoxicillin -pot Clavulanate] Other (See Comments)   Stomach upset   Bystolic [nebivolol Hcl] Other (See Comments)   Weakness    Coq10 [coenzyme Q10] Other (See Comments)   Unknown reaction   Cozaar [losartan] Other (See Comments)   Unknown reaction   Farxiga [dapagliflozin] Other (See Comments)   Excessive urination Dehydration   Glucotrol [glipizide] Other (See Comments)   Unknown reaction   Januvia [sitagliptin] Other (See Comments)   Unknown reaction   Keflex [cephalexin] Other (See Comments)   Unknown reaction   Lipitor [atorvastatin Calcium ]    Lipitor [atorvastatin] Other (See Comments)   Unknown reaction   Lopid [gemfibrozil] Other (See Comments)   Unknown reaction   Omnicef [cefdinir] Other (See Comments)   Unknown reaction   Prilosec [omeprazole] Other (See Comments)   Unknown reaction   Toprol  Xl [metoprolol ] Other (See Comments)   Weakness    Vibra-tab [doxycycline] Other (See Comments)   Fever  Chills   Zestril [lisinopril] Other (See Comments)   Constipation    Zetia [ezetimibe] Other (See Comments)   Unknown reaction   Zocor [simvastatin] Other (See Comments)   Unknown reaction   Neurontin [gabapentin] Other (See Comments)   Unknown reaction        Medication List     STOP taking these medications    aspirin  81 MG chewable tablet Commonly known as: Aspirin  Childrens       TAKE these medications    acetaminophen  500 MG tablet Commonly known as: TYLENOL  Take 1,000 mg by mouth every 8 (eight) hours as needed for moderate pain, fever or headache.   albuterol  108 (90 Base) MCG/ACT  inhaler Commonly known as: VENTOLIN  HFA Inhale 1 puff into the lungs every 4 (four) hours as needed for wheezing or shortness of breath.   amiodarone  200 MG tablet Commonly known as: PACERONE  Take 1 tablet (200 mg total) by mouth 2 (two) times daily.   apixaban  5 MG Tabs tablet Commonly known as: ELIQUIS  Take 1 tablet (5 mg total) by mouth 2 (two) times daily.   ASCORBIC ACID  PO Take 1 tablet by mouth daily. Vitamin C .   cetirizine 10 MG tablet Commonly known as: ZYRTEC Take 10 mg by mouth daily as needed for allergies.   docusate sodium  100 MG capsule Commonly known as: COLACE Take 100 mg by mouth daily.   ferrous sulfate  325 (65 FE) MG tablet Take 1 tablet (325 mg total) by mouth every other day. Start taking on: May 26, 2023   fexofenadine 180 MG tablet Commonly known as: ALLEGRA Take 180 mg by mouth daily.   FIBER PO Take 3 tablets by  mouth daily.   fludrocortisone  0.1 MG tablet Commonly known as: FLORINEF  Take 0.1 mg by mouth daily.   fluticasone  50 MCG/ACT nasal spray Commonly known as: FLONASE  Place 1 spray into both nostrils daily as needed for allergies.   glimepiride  2 MG tablet Commonly known as: AMARYL  Take 2 mg by mouth daily with breakfast.   HYDROcodone -acetaminophen  5-325 MG tablet Commonly known as: NORCO/VICODIN Take 1 tablet by mouth every 4 (four) hours as needed for moderate pain (pain score 4-6) ((score 4 to 6)).   metFORMIN  500 MG tablet Commonly known as: GLUCOPHAGE  Take 500 mg by mouth 2 (two) times daily with a meal.   midodrine  10 MG tablet Commonly known as: PROAMATINE  Take 1 tablet (10 mg total) by mouth 3 (three) times daily with meals. While awake. Do not lay down for 3-4 hours after taking   NexIUM 24HR 20 MG capsule Generic drug: esomeprazole Take 20 mg by mouth daily at 12 noon.   rosuvastatin  5 MG tablet Commonly known as: CRESTOR  Take 5 mg by mouth every evening.   SYSTANE OP Place 1 drop into both eyes 2 (two)  times daily as needed (dryness, irritation.).   VITAMIN B-6 PO Take 1 tablet by mouth daily.   Vitamin B12 1000 MCG Tbcr Take 1 tablet by mouth daily.   VITAMIN D -3 PO Take 1 capsule by mouth daily.   ZINC PO Take 1 tablet by mouth daily.        Follow-up Information     Ladona Heinz, MD Follow up.   Specialty: Cardiology Why: Follow-up with General Cardiology on 06/10/2023 at 9:20am. Please arrive 15 minutes early for check-in. If this date/ time does not work for you, please call our office to reschedule. Contact information: [address] [place] [address] [phone]         Nellene Bienenstock R, [place] Follow up.   Specialty: Cardiology Why: Follow-up in the [facility]ib Clinic scheduled for 06/24/2023 at 2:00pm. Contact information: [address] [place] [address] [phone]         Rolinda Millman, MD Follow up in 1 week(s).   Specialty: Family Medicine Contact information: [phone] [place] [address] [phone]                Allergies  Allergen Reactions   Norvasc [Amlodipine Besylate] Rash    Fatigue  Fever  Chills    Augmentin [Amoxicillin -Pot Clavulanate] Other (See Comments)    Stomach upset   Bystolic [Nebivolol Hcl] Other (See Comments)    Weakness    Coq10 [Coenzyme Q10] Other (See Comments)    Unknown reaction   Cozaar [Losartan] Other (See Comments)    Unknown reaction   Farxiga [Dapagliflozin] Other (See Comments)    Excessive urination Dehydration   Glucotrol [Glipizide] Other (See Comments)    Unknown reaction   Januvia [Sitagliptin] Other (See Comments)    Unknown reaction   Keflex [Cephalexin] Other (See Comments)    Unknown reaction   Lipitor [Atorvastatin Calcium ]    Lipitor [Atorvastatin] Other (See Comments)    Unknown reaction   Lopid [Gemfibrozil] Other (See Comments)    Unknown reaction   Omnicef [Cefdinir] Other (See Comments)    Unknown reaction   Prilosec  [Omeprazole] Other (See Comments)    Unknown reaction   Toprol  Xl [Metoprolol ] Other (See Comments)    Weakness    Vibra-Tab [Doxycycline] Other (See Comments)    Fever  Chills  Zestril [Lisinopril] Other (See Comments)    Constipation     Zetia [Ezetimibe] Other (See Comments)    Unknown reaction   Zocor [Simvastatin] Other (See Comments)    Unknown reaction   Neurontin [Gabapentin] Other (See Comments)    Unknown reaction    Consultations: Cardiology   Procedures/Studies: ECHOCARDIOGRAM COMPLETE Result Date: 05/20/2023    ECHOCARDIOGRAM REPORT   Patient Name:   WYAT INFINGER Date of Exam: 05/20/2023 Medical Rec #:  [id number]     Height:       69.0 in Accession #:    [id number]    Weight:       157.0 lb Date of Birth:  [birth year]     BSA:          1.864 m Patient Age:    82 years      BP:           131/72 mmHg Patient Gender: M             HR:           87 bpm. Exam Location:  Inpatient Procedure: 2D Echo, Cardiac Doppler and Color Doppler Indications:    Atrial Fibrillation I48.91  History:        Patient has prior history of Echocardiogram examinations, most                 recent 10/26/2022. Stroke and CKD, stage 3, Arrythmias:Atrial                 Fibrillation, Signs/Symptoms:Hypotension; Risk                 Factors:Hypertension, Diabetes and Dyslipidemia.  Sonographer:    Thea Norlander RCS Referring Phys: [id number] RONDELL A SMITH IMPRESSIONS  1. Left ventricular ejection fraction, by estimation, is 55 to 60%. The left ventricle has normal function. The left ventricle has no regional wall motion abnormalities. Left ventricular diastolic parameters are indeterminate.  2. Right ventricular systolic function is normal. The right ventricular size is normal. There is normal pulmonary artery systolic pressure.  3. The mitral valve is degenerative. Trivial mitral valve regurgitation. No evidence of mitral stenosis.  4. The aortic valve is tricuspid. Aortic valve regurgitation is trivial. No  aortic stenosis is present.  5. The inferior vena cava is dilated in size with >50% respiratory variability, suggesting right atrial pressure of 8 mmHg. FINDINGS  Left Ventricle: Left ventricular ejection fraction, by estimation, is 55 to 60%. The left ventricle has normal function. The left ventricle has no regional wall motion abnormalities. The left ventricular internal cavity size was normal in size. There is  no left ventricular hypertrophy. Left ventricular diastolic parameters are indeterminate. Indeterminate filling pressures. Right Ventricle: The right ventricular size is normal. No increase in right ventricular wall thickness. Right ventricular systolic function is normal. There is normal pulmonary artery systolic pressure. The tricuspid regurgitant velocity is 2.43 m/s, and  with an assumed right atrial pressure of 8 mmHg, the estimated right ventricular systolic pressure is 31.6 mmHg. Left Atrium: Left atrial size was normal in size. Right Atrium: Right atrial size was normal in size. Pericardium: There is no evidence of pericardial effusion. Mitral Valve: The mitral valve is degenerative in appearance. There is mild thickening of the mitral valve leaflet(s). Trivial mitral valve regurgitation. No evidence of mitral valve stenosis. Tricuspid Valve: The tricuspid valve is normal in structure. Tricuspid valve regurgitation is trivial. No evidence of tricuspid stenosis. Aortic Valve: The aortic valve is  tricuspid. Aortic valve regurgitation is trivial. No aortic stenosis is present. Aortic valve peak gradient measures 6.1 mmHg. Pulmonic Valve: The pulmonic valve was normal in structure. Pulmonic valve regurgitation is not visualized. No evidence of pulmonic stenosis. Aorta: The aortic root is normal in size and structure. Venous: The inferior vena cava is dilated in size with greater than 50% respiratory variability, suggesting right atrial pressure of 8 mmHg. IAS/Shunts: No atrial level shunt detected by  color flow Doppler.  LEFT VENTRICLE PLAX 2D LVIDd:         4.40 cm   Diastology LVIDs:         3.15 cm   LV e' medial:    9.32 cm/s LV PW:         1.10 cm   LV E/e' medial:  13.0 LV IVS:        0.90 cm   LV e' lateral:   9.32 cm/s LVOT diam:     2.10 cm   LV E/e' lateral: 13.0 LV SV:         48 LV SV Index:   26 LVOT Area:     3.46 cm  RIGHT VENTRICLE            IVC RV S prime:     8.70 cm/s  IVC diam: 2.10 cm TAPSE (M-mode): 1.7 cm LEFT ATRIUM             Index        RIGHT ATRIUM           Index LA diam:        3.80 cm 2.04 cm/m   RA Area:     11.00 cm LA Vol (A2C):   55.3 ml 29.67 ml/m  RA Volume:   17.50 ml  9.39 ml/m LA Vol (A4C):   43.1 ml 23.12 ml/m LA Biplane Vol: 51.8 ml 27.79 ml/m  AORTIC VALVE AV Area (Vmax): 2.25 cm AV Vmax:        123.00 cm/s AV Peak Grad:   6.1 mmHg LVOT Vmax:      79.87 cm/s LVOT Vmean:     54.200 cm/s LVOT VTI:       0.139 m  AORTA Ao Root diam: 3.70 cm Ao Asc diam:  3.30 cm MITRAL VALVE                TRICUSPID VALVE MV Area (PHT): 4.57 cm     TR Peak grad:   23.6 mmHg MV Decel Time: 166 msec     TR Vmax:        243.00 cm/s MV E velocity: 121.00 cm/s                             SHUNTS                             Systemic VTI:  0.14 m                             Systemic Diam: 2.10 cm Annabella Scarce MD Electronically signed by Annabella Scarce MD Signature Date/Time: 05/20/2023/6:30:10 PM    Final    DG Chest Port 1 View Result Date: 05/20/2023 CLINICAL DATA:  Atrial fibrillation. EXAM: PORTABLE CHEST 1 VIEW COMPARISON:  October 25, 2022. FINDINGS: Stable cardiomediastinal silhouette. Left lung is clear. Minimal right basilar subsegmental  atelectasis is noted. Bony thorax is unremarkable. IMPRESSION: Minimal right basilar subsegmental atelectasis. Electronically Signed   By: Lynwood Landy Raddle M.D.   On: 05/20/2023 12:31   CUP PACEART REMOTE DEVICE CHECK Result Date: 05/09/2023 ILR summary report received. Battery status OK. Normal device function. No new symptom, tachy,  brady, or pause episodes. No new AF episodes. Monthly summary reports and ROV/PRN LA, CVRS  DG Cervical Spine 1 View Result Date: 05/01/2023 CLINICAL DATA:  C4-5 ACDF. EXAM: DG CERVICAL SPINE - 1 VIEW COMPARISON:  MR cervical spine 01/17/2023. FINDINGS: Single cross-table lateral fluoroscopic spot view on the cervical spine shows C4-C6 anterior cervical fusion with interbody spacers. Osseous detail is degraded by technique. IMPRESSION: Intraoperative visualization of C4-6 anterior cervical fusion. Electronically Signed   By: Newell Eke M.D.   On: 05/01/2023 14:37   DG C-Arm 1-60 Min-No Report Result Date: 05/01/2023 Fluoroscopy was utilized by the requesting physician.  No radiographic interpretation.   DG C-Arm 1-60 Min-No Report Result Date: 05/01/2023 Fluoroscopy was utilized by the requesting physician.  No radiographic interpretation.     Discharge Exam: Vitals:   05/24/23 0707 05/24/23 0900  BP:  139/72  Pulse:  65  Resp: 20 18  Temp:  97.6 F (36.4 C)  SpO2:  95%   Vitals:   05/23/23 1953 05/24/23 0427 05/24/23 0707 05/24/23 0900  BP: (!) 161/73 (!) 156/70  139/72  Pulse: (!) 58 62  65  Resp:  16 20 18   Temp: 98.2 F (36.8 C) 97.8 F (36.6 C)  97.6 F (36.4 C)  TempSrc: Oral Oral  Oral  SpO2: 97% 93%  95%  Weight:      Height:        General: Pt is alert, awake, not in acute distress Cardiovascular: RRR, S1/S2 +, no rubs, no gallops Respiratory: CTA bilaterally, no wheezing, no rhonchi Abdominal: Soft, NT, ND, bowel sounds + Extremities: no edema, no cyanosis    The results of significant diagnostics from this hospitalization (including imaging, microbiology, ancillary and laboratory) are listed below for reference.     Microbiology: No results found for this or any previous visit (from the past 240 hours).   Labs: BNP (last 3 results) No results for input(s): BNP in the last 8760 hours. Basic Metabolic Panel: Recent Labs  Lab 05/20/23 1119  05/21/23 0449 05/21/23 0836 05/22/23 0522 05/23/23 1142 05/24/23 1048  NA 137 136  --  135 134* 135  K 4.6 4.6  --  4.6 4.5 4.2  CL 103 105  --  103 101 103  CO2 20* 23  --  25 21* 24  GLUCOSE 154* 141*  --  160* 176* 218*  BUN 16 20  --  21 19 14   CREATININE 1.40* 1.27*  --  1.36* 1.36* 1.39*  CALCIUM  9.4 9.0  --  8.9 9.1 9.0  MG 1.6*  --  2.2  --   --   --    Liver Function Tests: Recent Labs  Lab 05/24/23 1048  AST 18  ALT 13  ALKPHOS 102  BILITOT 0.4  PROT 5.7*  ALBUMIN 3.4*   No results for input(s): LIPASE, AMYLASE in the last 168 hours. No results for input(s): AMMONIA in the last 168 hours. CBC: Recent Labs  Lab 05/20/23 1119 05/21/23 0449 05/22/23 0522 05/23/23 1142 05/24/23 1048  WBC 82.3* 101.5* 88.9* 90.1* 75.8*  NEUTROABS  --   --   --  0.0* 1.1*  HGB 9.1* 7.9* 7.7* 8.1* 8.2*  HCT 29.2* 25.6* 25.1* 25.4* 25.5*  MCV 102.1* 104.1* 101.6* 100.8* 100.4*  PLT 138* 118* 106* 122* 105*   Cardiac Enzymes: No results for input(s): CKTOTAL, CKMB, CKMBINDEX, TROPONINI in the last 168 hours. BNP: Invalid input(s): POCBNP CBG: Recent Labs  Lab 05/23/23 1209 05/23/23 1611 05/23/23 2059 05/24/23 0707 05/24/23 1211  GLUCAP 149* 171* 142* 123* 156*   D-Dimer No results for input(s): DDIMER in the last 72 hours. Hgb A1c No results for input(s): HGBA1C in the last 72 hours. Lipid Profile No results for input(s): CHOL, HDL, LDLCALC, TRIG, CHOLHDL, LDLDIRECT in the last 72 hours. Thyroid  function studies No results for input(s): TSH, T4TOTAL, T3FREE, THYROIDAB in the last 72 hours.  Invalid input(s): FREET3 Anemia work up Recent Labs    05/22/23 0522  VITAMINB12 491  FOLATE 16.7  FERRITIN 142  TIBC 323  IRON 35*   Urinalysis    Component Value Date/Time   COLORURINE YELLOW 07/11/2022 1400   APPEARANCEUR CLEAR 07/11/2022 1400   LABSPEC 1.021 07/11/2022 1400   PHURINE 5.0 07/11/2022 1400   GLUCOSEU  >=500 (A) 07/11/2022 1400   HGBUR NEGATIVE 07/11/2022 1400   BILIRUBINUR NEGATIVE 07/11/2022 1400   KETONESUR NEGATIVE 07/11/2022 1400   PROTEINUR NEGATIVE 07/11/2022 1400   NITRITE NEGATIVE 07/11/2022 1400   LEUKOCYTESUR NEGATIVE 07/11/2022 1400   Sepsis Labs Recent Labs  Lab 05/21/23 0449 05/22/23 0522 05/23/23 1142 05/24/23 1048  WBC 101.5* 88.9* 90.1* 75.8*   Microbiology No results found for this or any previous visit (from the past 240 hours).  FURTHER DISCHARGE INSTRUCTIONS:   Get Medicines reviewed and adjusted: Please take all your medications with you for your next visit with your Primary MD   Laboratory/radiological data: Please request your Primary MD to go over all hospital tests and procedure/radiological results at the follow up, please ask your Primary MD to get all Hospital records sent to his/her office.   In some cases, they will be blood work, cultures and biopsy results pending at the time of your discharge. Please request that your primary care M.D. goes through all the records of your hospital data and follows up on these results.   Also Note the following: If you experience worsening of your admission symptoms, develop shortness of breath, life threatening emergency, suicidal or homicidal thoughts you must seek medical attention immediately by calling 911 or calling your MD immediately  if symptoms less severe.   You must read complete instructions/literature along with all the possible adverse reactions/side effects for all the Medicines you take and that have been prescribed to you. Take any new Medicines after you have completely understood and accpet all the possible adverse reactions/side effects.    Do not drive when taking Pain medications or sleeping medications (Benzodaizepines)   Do not take more than prescribed Pain, Sleep and Anxiety Medications. It is not advisable to combine anxiety,sleep and pain medications without talking with your primary  care practitioner   Special Instructions: If you have smoked or chewed Tobacco  in the last 2 yrs please stop smoking, stop any regular Alcohol  and or any Recreational drug use.   Wear Seat belts while driving.   Please note: You were cared for by a hospitalist during your hospital stay. Once you are discharged, your primary care physician will handle any further medical issues. Please note that NO REFILLS for any discharge medications will be authorized once you are discharged, as it is imperative that you return to your primary care physician (  or establish a relationship with a primary care physician if you do not have one) for your post hospital discharge needs so that they can reassess your need for medications and monitor your lab values  Time coordinating discharge: Over 30 minutes  SIGNED:   Fredia Skeeter, MD  [facility] 05/24/2023, 12:38 PM *Please note that this is a verbal dictation therefore any spelling or grammatical errors are due to the Dragon Medical One system interpretation. If 7PM-7AM, please contact night-coverage [email or web address]

## 2023-05-24 NOTE — Progress Notes (Signed)
 Mobility Specialist Progress Note:   05/24/23 1200  Mobility  Activity Ambulated with assistance in hallway  Level of Assistance Contact guard assist, steadying assist  Assistive Device Front wheel walker  Distance Ambulated (ft) 250 ft  Activity Response Tolerated well  Mobility Referral Yes  Mobility visit 1 Mobility  Mobility Specialist Start Time (ACUTE ONLY) 1149  Mobility Specialist Stop Time (ACUTE ONLY) 1159  Mobility Specialist Time Calculation (min) (ACUTE ONLY) 10 min    Pre Mobility: 63 HR,  163/73 (97) BP Post Mobility:  69 HR  Received pt in bed having no complaints and agreeable to mobility. Pt was asymptomatic throughout ambulation and returned to room w/o fault. Left opn EOB w/ call bell in reach and all needs met. Family present.   D'Vante Nicholaus Mobility Specialist Please contact via Special Educational Needs Teacher or Rehab office at [phone]

## 2023-06-07 ENCOUNTER — Other Ambulatory Visit: Payer: Self-pay

## 2023-06-07 DIAGNOSIS — C9110 Chronic lymphocytic leukemia of B-cell type not having achieved remission: Secondary | ICD-10-CM

## 2023-06-09 NOTE — Progress Notes (Unsigned)
Cardiology Office Note:  .   Date:  06/10/2023  ID:  Eric Harrington, DOB [birth year], MRN [id number] PCP: Aliene Beams, MD  [facility] Providers Cardiologist:  Yates Decamp, MD   History of Present Illness: .   Eric Harrington is a 83 y.o. male with a history of parox AFib, severe orthostatic hypotension on midodrine and florinef due to near syncopal spells, TIA, CKD stage 3, CLL with anemia with chronic thrombocytopenia, HLD, HTN, T2DM, recent ACDF (04/2023), ILR in situ  who is being seen today for the evaluation of Afib w RVR, Eliquis was discontinued after mutual discussion in view of thrombocytopenia and orthostatic hypotension in May 2024 but presented with TIA with transient aphasia and June 2024 but patient was in sinus rhythm and loop implantation was performed after discussions, however presented on 05/20/2023 with impending doom and patient found to be in A-fib with RVR.  He was started back on Eliquis and also on amiodarone and discharged home.  He now presents for follow-up.  Discussed the use of AI scribe software for clinical note transcription with the patient, who gave verbal consent to proceed.  History of Present Illness   The patient, with a history of AFib, CLL, and orthostatic hypotension, presents with increasing weakness and near fainting in the mornings over the past three days. The patient's family reports that he has been feeling weak all day for the past two days and has needed his walker to get to the bathroom. The patient's blood pressure has been low in the mornings, with a reading of 80/50 upon getting out of bed. The patient is currently on amiodarone and Eliquis. The patient's family also mentions that the patient's right foot has been swelling slightly. The patient's family expresses concern about the cost of Eliquis, which is reportedly going to cost the patient $323 a month with insurance.      Labs   Lab Results  Component Value Date   CHOL 130  10/26/2022   HDL 22 (L) 10/26/2022   LDLCALC 56 10/26/2022   TRIG 258 (H) 10/26/2022   CHOLHDL 5.9 10/26/2022   Lab Results  Component Value Date   NA 133 (L) 06/10/2023   K 4.6 06/10/2023   CO2 25 06/10/2023   GLUCOSE 113 (H) 06/10/2023   BUN 25 (H) 06/10/2023   CREATININE 1.94 (H) 06/10/2023   CALCIUM 10.1 06/10/2023   GFRNONAA 34 (L) 06/10/2023      Latest Ref Rng & Units 06/10/2023   11:47 AM 05/24/2023   10:48 AM 05/23/2023   11:42 AM  BMP  Glucose 70 - 99 mg/dL 841  324  401   BUN 8 - 23 mg/dL 25  14  19    Creatinine 0.61 - 1.24 mg/dL 0.27  2.53  6.64   Sodium 135 - 145 mmol/L 133  135  134   Potassium 3.5 - 5.1 mmol/L 4.6  4.2  4.5   Chloride 98 - 111 mmol/L 99  103  101   CO2 22 - 32 mmol/L 25  24  21    Calcium 8.9 - 10.3 mg/dL 40.3  9.0  9.1       Latest Ref Rng & Units 06/10/2023   11:47 AM 05/24/2023   10:48 AM 05/23/2023   11:42 AM  CBC  WBC 4.0 - 10.5 K/uL 119.2  75.8  90.1   Hemoglobin 13.0 - 17.0 g/dL 9.7  8.2  8.1   Hematocrit 39.0 - 52.0 % 31.2  25.5  25.4   Platelets 150 - 400 K/uL 104  105  122    Review of Systems  Cardiovascular:  Positive for near-syncope. Negative for chest pain, dyspnea on exertion and leg swelling.  Neurological:  Positive for dizziness.    Physical Exam:   VS:  BP 126/72 (BP Location: Left Arm, Patient Position: Sitting, Cuff Size: Small)   Pulse 99   Resp 17   Ht 5\' 9"  (1.753 m)   Wt 159 lb (72.1 kg)   SpO2 99%   BMI 23.48 kg/m    Wt Readings from Last 3 Encounters:  06/10/23 163 lb 3.2 oz (74 kg)  06/10/23 159 lb (72.1 kg)  05/20/23 160 lb 12.8 oz (72.9 kg)    Orthostatic Vitals for the past 48 hrs (Last 6 readings):  Patient Position BP Pulse BP Location Cuff Size Patient Position (if appropriate) BP- Standing at 0 minutes Pulse- Standing at 0 minutes BP- Sitting Pulse- Sitting BP- Lying Pulse- Lying  06/10/23 0917 Sitting 126/72 99 Left Arm Small -- -- -- -- -- -- --  06/10/23 0918 -- -- -- -- -- Orthostatic Vitals  117/61 67 150/74 64 187/80 62   Physical Exam Neck:     Vascular: No carotid bruit or JVD.  Cardiovascular:     Rate and Rhythm: Normal rate and regular rhythm.     Pulses: Intact distal pulses.     Heart sounds: Normal heart sounds. No murmur heard.    No gallop.  Pulmonary:     Effort: Pulmonary effort is normal.     Breath sounds: Normal breath sounds.  Abdominal:     General: Bowel sounds are normal.     Palpations: Abdomen is soft.  Musculoskeletal:     Right lower leg: No edema.     Left lower leg: No edema.    Studies Reviewed: Marland Kitchen    ECHOCARDIOGRAM COMPLETE 05/20/2023  1. Left ventricular ejection fraction, by estimation, is 55 to 60%. The left ventricle has normal function. The left ventricle has no regional wall motion abnormalities. Left ventricular diastolic parameters are indeterminate. 2. Right ventricular systolic function is normal. The right ventricular size is normal. There is normal pulmonary artery systolic pressure. 3. The mitral valve is degenerative. Trivial mitral valve regurgitation. No evidence of mitral stenosis. 4. The aortic valve is tricuspid. Aortic valve regurgitation is trivial. No aortic stenosis is present. 5. The inferior vena cava is dilated in size with >50% respiratory variability, suggesting right atrial pressure of 8 mmHg.  EKG:    EKG Interpretation Date/Time:  Monday June 10 2023 09:14:55 EST Ventricular Rate:  63 PR Interval:  200 QRS Duration:  140 QT Interval:  476 QTC Calculation: 487 R Axis:   -67  Text Interpretation: EKG 06/10/2023: Normal sinus rhythm at rate of 63 bpm, left anterior fascicular block. Right bundle branch block.  Bifascicular block.  Compared to 05/21/2023, A-fib with RVR has been replaced.  Compared to 10/25/2022, right bundle branch block is new. Confirmed by Delrae Rend [phone]) on 06/10/2023 9:17:33 AM    EKG 11/07/2022: Normal sinus rhythm at rate of 65 bpm, left anterior fascicular block. IVCD, LVH. No  evidence of ischemia. Compared to 09/27/2022, no significant change   Medications and allergies    Allergies  Allergen Reactions   Norvasc [Amlodipine Besylate] Rash    Fatigue  Fever  Chills    Augmentin [Amoxicillin-Pot Clavulanate] Other (See Comments)    Stomach upset   Bystolic [Nebivolol Hcl] Other (See Comments)  Weakness    Coq10 [Coenzyme Q10] Other (See Comments)    Unknown reaction   Cozaar [Losartan] Other (See Comments)    Unknown reaction   Farxiga [Dapagliflozin] Other (See Comments)    Excessive urination Dehydration   Glucotrol [Glipizide] Other (See Comments)    Unknown reaction   Januvia [Sitagliptin] Other (See Comments)    Unknown reaction   Keflex [Cephalexin] Other (See Comments)    Unknown reaction   Lipitor [Atorvastatin Calcium]    Lipitor [Atorvastatin] Other (See Comments)    Unknown reaction   Lopid [Gemfibrozil] Other (See Comments)    Unknown reaction   Omnicef [Cefdinir] Other (See Comments)    Unknown reaction   Prilosec [Omeprazole] Other (See Comments)    Unknown reaction   Toprol Xl [Metoprolol] Other (See Comments)    Weakness    Vibra-Tab [Doxycycline] Other (See Comments)    Fever  Chills   Zestril [Lisinopril] Other (See Comments)    Constipation     Zetia [Ezetimibe] Other (See Comments)    Unknown reaction   Zocor [Simvastatin] Other (See Comments)    Unknown reaction   Neurontin [Gabapentin] Other (See Comments)    Unknown reaction     Current Outpatient Medications:    acetaminophen (TYLENOL) 500 MG tablet, Take 1,000 mg by mouth every 8 (eight) hours as needed for moderate pain, fever or headache., Disp: , Rfl:    albuterol (VENTOLIN HFA) 108 (90 Base) MCG/ACT inhaler, Inhale 1 puff into the lungs every 4 (four) hours as needed for wheezing or shortness of breath., Disp: , Rfl:    apixaban (ELIQUIS) 5 MG TABS tablet, Take 1 tablet (5 mg total) by mouth 2 (two) times daily., Disp: 60 tablet, Rfl: 0   ASCORBIC ACID  PO, Take 1 tablet by mouth daily. Vitamin C., Disp: , Rfl:    cetirizine (ZYRTEC) 10 MG tablet, Take 10 mg by mouth daily as needed for allergies., Disp: , Rfl:    Cholecalciferol (VITAMIN D-3 PO), Take 1 capsule by mouth daily., Disp: , Rfl:    Cyanocobalamin (VITAMIN B12) 1000 MCG TBCR, Take 1 tablet by mouth daily., Disp: , Rfl:    docusate sodium (COLACE) 100 MG capsule, Take 100 mg by mouth daily. , Disp: , Rfl:    esomeprazole (NEXIUM 24HR) 20 MG capsule, Take 20 mg by mouth daily at 12 noon., Disp: , Rfl:    ferrous sulfate 325 (65 FE) MG tablet, Take 1 tablet (325 mg total) by mouth every other day., Disp: 15 tablet, Rfl: 0   fexofenadine (ALLEGRA) 180 MG tablet, Take 180 mg by mouth daily., Disp: , Rfl:    FIBER PO, Take 3 tablets by mouth daily., Disp: , Rfl:    fludrocortisone (FLORINEF) 0.1 MG tablet, Take 0.1 mg by mouth daily., Disp: , Rfl:    fluticasone (FLONASE) 50 MCG/ACT nasal spray, Place 1 spray into both nostrils daily as needed for allergies., Disp: , Rfl:    glimepiride (AMARYL) 2 MG tablet, Take 2 mg by mouth daily with breakfast., Disp: , Rfl:    metFORMIN (GLUCOPHAGE) 500 MG tablet, Take 500 mg by mouth 2 (two) times daily with a meal., Disp: , Rfl:    midodrine (PROAMATINE) 10 MG tablet, Take 1 tablet (10 mg total) by mouth 3 (three) times daily with meals. While awake. Do not lay down for 3-4 hours after taking, Disp: 270 tablet, Rfl: 3   Multiple Vitamins-Minerals (ZINC PO), Take 1 tablet by mouth daily., Disp: ,  Rfl:    Polyethyl Glycol-Propyl Glycol (SYSTANE OP), Place 1 drop into both eyes 2 (two) times daily as needed (dryness, irritation.)., Disp: , Rfl:    Pyridoxine HCl (VITAMIN B-6 PO), Take 1 tablet by mouth daily., Disp: , Rfl:    rosuvastatin (CRESTOR) 5 MG tablet, Take 5 mg by mouth every evening., Disp: , Rfl:    amiodarone (PACERONE) 200 MG tablet, Take 0.5 tablets (100 mg total) by mouth daily., Disp: 45 tablet, Rfl: 1   ASSESSMENT AND PLAN: .       ICD-10-CM   1. Paroxysmal atrial fibrillation (HCC)  I48.0 EKG 12-Lead    amiodarone (PACERONE) 200 MG tablet    Ambulatory referral to Cardiac Electrophysiology    2. Orthostatic hypotension  I95.1     3. Supine hypertension  I10      Click Here to Calculate/Change CHADS2VASc Score The patient's CHADS2-VASc score is 6, indicating a 9.7% annual risk of stroke.  Therefore, anticoagulation is recommended.   CHF History: No HTN History: Yes Diabetes History: Yes Stroke History: Yes Vascular Disease History: No    Assessment and Plan  1. Paroxysmal atrial fibrillation [facility]) Atrial Fibrillation Recent hospitalization for AFib. Currently on Eliquis and Amiodarone. High risk of stroke (10%) due to AFib, but also high risk of bleeding due to low platelet count from CLL. Discussed the risk/benefit of continuing anticoagulation and potential for Watchman device. -Continue Eliquis. -Reduce Amiodarone to 100mg  daily. -Refer to Electrophysiology for consideration of Watchman device.  [facility] Referral for Left Atrial Appendage Closure with Non-Valvular Atrial Fibrillation   Eric Harrington is a 83 y.o. male is being referred to the [facility] Team for evaluation for Left Atrial Appendage Closure with Watchman device for the management of stroke risk resulting form non-valvular atrial fibrillation.    Base upon Mr. Lyday's history, he is felt to be a poor candidate for long-term anticoagulation because of intolerance to oral anticoagulation, a high risk of recurrent falls, and increased bleeding risk (e.g. thrombocytopenia, cancer, risk of tumor associated bleeding in case of systemic anticoagulation).  The patient has a HAS-BLED score of 4 indicating a Yearly Major Bleeding Risk of 8.7%.      His CHADS2-VASc Score is 6 with an unadjusted Ischemic Stroke Rate (% per year) of 9.7%.    His stroke risk necessitates a strategy of stroke prevention with either long-term oral  anticoagulation or left atrial appendage occlusion therapy. We have discussed their bleeding risk in the context of their comorbid medical problems, as well as the rationale for referral for evaluation of Watchman left atrial appendage occlusion therapy. While the patient is at high long-term bleeding risk, they may be appropriate for short-term anticoagulation. Based on this individual patient's stroke and bleeding risk, a shared decision has been made to refer the patient for consideration of Watchman left atrial appendage closure utilizing the [employer] shared decision tool.    Chronic Lymphocytic Leukemia (CLL) Low platelet count and hemoglobin likely contributing to weakness and orthostatic hypotension. -Continue current management under Hematologist. -See Hematologist today for further management.  Orthostatic Hypotension-severe Likely multifactorial due to CLL and longstanding diabetes mellitus.  He has had near syncopal spells in the past.  His supine hypertension is untreated in view of severe orthostatic hypotension, blood pressure dropped from 187 to 117 mmHg systolic today.  Amiodarone can potentially make orthostasis worse however he has been on it only for short time, will reduce the dose  to 100 mg daily as he is presently maintaining sinus rhythm.   -Reduce Amiodarone to 100mg  daily. -Continue midodrine as prescribed, taking first dose in bed and waiting 15-20 minutes before standing. -Continue use of support stockings.  Eliquis Cost Patient reports high out-of-pocket cost for Eliquis. -Apply for patient assistance from the company.  Follow-up in 3 months.  I spent 44 minutes in evaluation of the patient, evaluation of records from hospitalization, coordination of care.  Patient's daughter and wife present and all the questions were answered.  Signed,  Yates Decamp, MD, [facility] 06/10/2023, 1:11 PM [facility] [address] [place], [place] [address] Phone: [phone]. Fax:  [phone]

## 2023-06-10 ENCOUNTER — Inpatient Hospital Stay: Payer: Medicare Other | Attending: Hematology

## 2023-06-10 ENCOUNTER — Ambulatory Visit: Payer: Medicare Other | Attending: Cardiology | Admitting: Cardiology

## 2023-06-10 ENCOUNTER — Encounter: Payer: Self-pay | Admitting: Cardiology

## 2023-06-10 ENCOUNTER — Inpatient Hospital Stay (HOSPITAL_BASED_OUTPATIENT_CLINIC_OR_DEPARTMENT_OTHER): Payer: Medicare Other | Admitting: Hematology

## 2023-06-10 ENCOUNTER — Ambulatory Visit (INDEPENDENT_AMBULATORY_CARE_PROVIDER_SITE_OTHER): Payer: Medicare Other

## 2023-06-10 ENCOUNTER — Other Ambulatory Visit: Payer: Self-pay

## 2023-06-10 VITALS — BP 120/50 | HR 73 | Temp 97.7°F | Resp 18 | Wt 163.2 lb

## 2023-06-10 VITALS — BP 126/72 | HR 99 | Resp 17 | Ht 69.0 in | Wt 159.0 lb

## 2023-06-10 DIAGNOSIS — I129 Hypertensive chronic kidney disease with stage 1 through stage 4 chronic kidney disease, or unspecified chronic kidney disease: Secondary | ICD-10-CM | POA: Diagnosis not present

## 2023-06-10 DIAGNOSIS — R7989 Other specified abnormal findings of blood chemistry: Secondary | ICD-10-CM | POA: Insufficient documentation

## 2023-06-10 DIAGNOSIS — R944 Abnormal results of kidney function studies: Secondary | ICD-10-CM | POA: Diagnosis not present

## 2023-06-10 DIAGNOSIS — E611 Iron deficiency: Secondary | ICD-10-CM | POA: Insufficient documentation

## 2023-06-10 DIAGNOSIS — C9110 Chronic lymphocytic leukemia of B-cell type not having achieved remission: Secondary | ICD-10-CM

## 2023-06-10 DIAGNOSIS — I951 Orthostatic hypotension: Secondary | ICD-10-CM | POA: Diagnosis not present

## 2023-06-10 DIAGNOSIS — D649 Anemia, unspecified: Secondary | ICD-10-CM

## 2023-06-10 DIAGNOSIS — Z87891 Personal history of nicotine dependence: Secondary | ICD-10-CM | POA: Diagnosis not present

## 2023-06-10 DIAGNOSIS — E1122 Type 2 diabetes mellitus with diabetic chronic kidney disease: Secondary | ICD-10-CM | POA: Diagnosis not present

## 2023-06-10 DIAGNOSIS — R911 Solitary pulmonary nodule: Secondary | ICD-10-CM | POA: Insufficient documentation

## 2023-06-10 DIAGNOSIS — I4891 Unspecified atrial fibrillation: Secondary | ICD-10-CM | POA: Insufficient documentation

## 2023-06-10 DIAGNOSIS — G629 Polyneuropathy, unspecified: Secondary | ICD-10-CM | POA: Insufficient documentation

## 2023-06-10 DIAGNOSIS — R519 Headache, unspecified: Secondary | ICD-10-CM | POA: Diagnosis not present

## 2023-06-10 DIAGNOSIS — Z801 Family history of malignant neoplasm of trachea, bronchus and lung: Secondary | ICD-10-CM | POA: Diagnosis not present

## 2023-06-10 DIAGNOSIS — I480 Paroxysmal atrial fibrillation: Secondary | ICD-10-CM

## 2023-06-10 DIAGNOSIS — Z7901 Long term (current) use of anticoagulants: Secondary | ICD-10-CM | POA: Diagnosis not present

## 2023-06-10 DIAGNOSIS — N183 Chronic kidney disease, stage 3 unspecified: Secondary | ICD-10-CM | POA: Diagnosis not present

## 2023-06-10 DIAGNOSIS — I10 Essential (primary) hypertension: Secondary | ICD-10-CM | POA: Diagnosis not present

## 2023-06-10 LAB — IRON AND IRON BINDING CAPACITY (CC-WL,HP ONLY)
Iron: 72 ug/dL (ref 45–182)
Saturation Ratios: 18 % (ref 17.9–39.5)
TIBC: 406 ug/dL (ref 250–450)
UIBC: 334 ug/dL (ref 117–376)

## 2023-06-10 LAB — CBC WITH DIFFERENTIAL (CANCER CENTER ONLY)
Abs Immature Granulocytes: 0.05 10*3/uL (ref 0.00–0.07)
Basophils Absolute: 0.1 10*3/uL (ref 0.0–0.1)
Basophils Relative: 0 %
Eosinophils Absolute: 0 10*3/uL (ref 0.0–0.5)
Eosinophils Relative: 0 %
HCT: 31.2 % — ABNORMAL LOW (ref 39.0–52.0)
Hemoglobin: 9.7 g/dL — ABNORMAL LOW (ref 13.0–17.0)
Immature Granulocytes: 0 %
Lymphocytes Relative: 93 %
Lymphs Abs: 110.6 10*3/uL — ABNORMAL HIGH (ref 0.7–4.0)
MCH: 32.1 pg (ref 26.0–34.0)
MCHC: 31.1 g/dL (ref 30.0–36.0)
MCV: 103.3 fL — ABNORMAL HIGH (ref 80.0–100.0)
Monocytes Absolute: 7.5 10*3/uL — ABNORMAL HIGH (ref 0.1–1.0)
Monocytes Relative: 6 %
Neutro Abs: 1 10*3/uL — ABNORMAL LOW (ref 1.7–7.7)
Neutrophils Relative %: 1 %
Platelet Count: 104 10*3/uL — ABNORMAL LOW (ref 150–400)
RBC: 3.02 MIL/uL — ABNORMAL LOW (ref 4.22–5.81)
RDW: 15.7 % — ABNORMAL HIGH (ref 11.5–15.5)
Smear Review: NORMAL
WBC Count: 119.2 10*3/uL (ref 4.0–10.5)
nRBC: 0 % (ref 0.0–0.2)

## 2023-06-10 LAB — CMP (CANCER CENTER ONLY)
ALT: 13 U/L (ref 0–44)
AST: 16 U/L (ref 15–41)
Albumin: 4.9 g/dL (ref 3.5–5.0)
Alkaline Phosphatase: 113 U/L (ref 38–126)
Anion gap: 9 (ref 5–15)
BUN: 25 mg/dL — ABNORMAL HIGH (ref 8–23)
CO2: 25 mmol/L (ref 22–32)
Calcium: 10.1 mg/dL (ref 8.9–10.3)
Chloride: 99 mmol/L (ref 98–111)
Creatinine: 1.94 mg/dL — ABNORMAL HIGH (ref 0.61–1.24)
GFR, Estimated: 34 mL/min — ABNORMAL LOW (ref >60)
Glucose, Bld: 113 mg/dL — ABNORMAL HIGH (ref 70–99)
Potassium: 4.6 mmol/L (ref 3.5–5.1)
Sodium: 133 mmol/L — ABNORMAL LOW (ref 135–145)
Total Bilirubin: 0.4 mg/dL (ref 0.0–1.2)
Total Protein: 7 g/dL (ref 6.5–8.1)

## 2023-06-10 LAB — LACTATE DEHYDROGENASE: LDH: 123 U/L (ref 98–192)

## 2023-06-10 LAB — FERRITIN: Ferritin: 100 ng/mL (ref 24–336)

## 2023-06-10 MED ORDER — AMIODARONE HCL 200 MG PO TABS: 100.0000 mg | ORAL_TABLET | Freq: Every day | ORAL | 1 refills | Status: DC

## 2023-06-10 NOTE — Patient Instructions (Addendum)
Medication Instructions:  Your physician has recommended you make the following change in your medication: Decrease amiodarone to 100 mg by mouth daily   *If you need a refill on your cardiac medications before your next appointment, please call your pharmacy*   Lab Work: none If you have labs (blood work) drawn today and your tests are completely normal, you will receive your results only by: MyChart Message (if you have MyChart) OR A paper copy in the mail If you have any lab test that is abnormal or we need to change your treatment, we will call you to review the results.   Testing/Procedures: none   Follow-Up: At [facility], you and your health needs are our priority.  As part of our continuing mission to provide you with exceptional heart care, we have created designated Provider Care Teams.  These Care Teams include your primary Cardiologist (physician) and Advanced Practice Providers (APPs -  Physician Assistants and Nurse Practitioners) who all work together to provide you with the care you need, when you need it.  We recommend signing up for the patient portal called "MyChart".  Sign up information is provided on this After Visit Summary.  MyChart is used to connect with patients for Virtual Visits (Telemedicine).  Patients are able to view lab/test results, encounter notes, upcoming appointments, etc.  Non-urgent messages can be sent to your provider as well.   To learn more about what you can do with MyChart, go to [email or web address].    Your next appointment:   3 month(s)--April 21  Provider:   Yates Decamp, MD     Other Instructions   You have been referred to electrophysiology   Information regarding Eliquis assistance   [email or web address]     [phone]

## 2023-06-10 NOTE — Progress Notes (Signed)
HEMATOLOGY ONCOLOGY CLINIC NOTE  Date of Service:  06/10/23    Patient Care Team: Eric Beams, MD as PCP - General (Family Medicine) Eric Decamp, MD as PCP - Cardiology (Cardiology) Eric Maine, MD as Consulting Physician (Hematology)  CHIEF COMPLAINTS/PURPOSE OF CONSULTATION:  Continued evaluation and management of CLL  HISTORY OF PRESENTING ILLNESS:   Eric Harrington 83 y.o. male is here because of a referral from Dr. Donnetta Harrington from [facility] regarding a trend in his elevated WBC.   He is accompanied today by his wife of 54 years. The pt reports that he is doing well overall. He recently had a biopsy to check his prostate at [facility] with Dr. Devoria Harrington and they took 12 core samples with reportedly no significant findings.  Of note prior to the patient's visit today, pt has had CBC completed on 04/26/17 with results revealing WBC at 14.7, Hgb at 12.6, Lymph Abs at 11.0 with all other values WNL. On 03/26/17 his WBC were 12.8, Hgb at 13.3 and Lymph's at  9.90. On 02/06/17 his WBC were 12.7, Hgb at 13.4, and Lymph's at 8.80.   On review of systems, pt reports post nasal drip, persistent cough, occasional troublesome stomach, pain in his lower abdomen that feels like his muscles are irritated, reports he has lost 25 lbs in the last 2.5 years (he associates a decreased appetite after taking beta blockers during this period) and denies no recent colds or infections, acute changes in energy levels, and leg swelling.   On PMHx the pt reports taking Zetia. He has IB'S and has had 3 colonoscopies with no significant findings or inflammatory processes. He reports that over the last 2.5 years he had heart catheterizations that all turned out well. He reports having diabetes. He also reports neuropathy along his whole right side that lasted for a few months that ceased abruptly.   Interval History:   Eric Harrington is a wonderful gentleman who is here for continued  evaluation and management of CLL.  Patient was last seen by me on 01/07/2023 and he complained of recurrent headaches, weakness, being cold, and burning sensation with bowel movement due to hemorrhoids.   Patient was admitted to the hospital from 05/20/2023 to 05/24/2023 due to Paroxysmal atrial fibrillation. He was presented with chest discomfort. Patient was received Amiodarone on 05/20/2024 and was transitioned to oral Amiodarone medication. He was also prescribed Eliquis at discharge.  Patient is accompanied by his wife and his daughter during this visit. Patient notes he has been doing fairly well overall since our last visit. He notes that his neck surgery, around 5-6 weeks ago, went well without any significant complications. He is healing well from his neck surgery and recent hospitalization.   He regularly follows-up with his cardiologist regarding his A-fib. Patient continues to take Eliquis and has been prescribed iron supplement due to iron deficiency.   He denies any new infection issues, fever, chills, night sweats, unexpected weight loss, back pain, chest pain, abdominal pain, or leg swelling. Pt's wife notes that patient has been feeling more fatigued/lethargic for the past few days. Patient reports that his appetite has slightly decreased since our last visit.   Patient has been referred to cardiac-electrophysiologist by his Cardiologist.   Patient's daughter notes that the pt's blood pressure has been low in the morning.    Patient continues to follow-up with his Neurologist, Cardiologist, and PCP.   Patient's symptoms included headache, light-headiness, and he felt "  really bad" during the morning of A-fib. He denies chest pain, SOB, nor chest tightness. His heart rate was around 140-150 bpm.   Patient notes that his diabetes is well-controlled with his medication.   He denies any nose bleeds, blood in stool, hematuria, or any other abnormal bleeding issues.    MEDICAL  HISTORY:  1. HTN 2. DM2 3. H/o PAC's 4. transient a fib with cardiac cath 5. HLD 6. CKD stage 3 7. Irritable bowel syndrome 8. GERD 9. H/o presyndope  SURGICAL HISTORY:  1. Prostate bx 2. Cardiac cath  SOCIAL HISTORY: Social History   Socioeconomic History   Marital status: Married    Spouse name: Not on file   Number of children: 2   Years of education: Not on file   Highest education level: Not on file  Occupational History   Not on file  Tobacco Use   Smoking status: Former    Current packs/day: 0.00    Average packs/day: 1 pack/day for 30.0 years (30.0 ttl pk-yrs)    Types: Cigarettes    Start date: 46    Quit date: 89    Years since quitting: 45.0   Smokeless tobacco: Never   Tobacco comments:    started at age 13  Vaping Use   Vaping status: Never Used  Substance and Sexual Activity   Alcohol use: Never   Drug use: Not Currently   Sexual activity: Not Currently    Birth control/protection: None  Other Topics Concern   Not on file  Social History Narrative   2 Cups of Coffee   Right Handed    Social Drivers of Health   Financial Resource Strain: Low Risk  (05/20/2023)   Overall Financial Resource Strain (CARDIA)    Difficulty of Paying Living Expenses: Not hard at all  Food Insecurity: No Food Insecurity (05/20/2023)   Hunger Vital Sign    Worried About Running Out of Food in the Last Year: Never true    Ran Out of Food in the Last Year: Never true  Transportation Needs: No Transportation Needs (05/20/2023)   PRAPARE - Administrator, Civil Service (Medical): No    Lack of Transportation (Non-Medical): No  Physical Activity: Inactive (05/20/2023)   Exercise Vital Sign    Days of Exercise per Week: 0 days    Minutes of Exercise per Session: 0 min  Stress: No Stress Concern Present (05/20/2023)   [employer] of Occupational Health - Occupational Stress Questionnaire    Feeling of Stress : Only a little  Social Connections:  Moderately Integrated (05/20/2023)   Social Connection and Isolation Panel [NHANES]    Frequency of Communication with Friends and Family: More than three times a week    Frequency of Social Gatherings with Friends and Family: Twice a week    Attends Religious Services: More than 4 times per year    Active Member of [employer] or Organizations: No    Attends Banker Meetings: Never    Marital Status: Married  Catering manager Violence: Not At Risk (05/20/2023)   Humiliation, Afraid, Rape, and Kick questionnaire    Fear of Current or Ex-Partner: No    Emotionally Abused: No    Physically Abused: No    Sexually Abused: No    FAMILY HISTORY: Family History  Problem Relation Age of Onset   Lung cancer Mother    Heart attack Father    Dementia Sister    Diabetes Brother    Diabetes  Sister    Heart attack Brother     ALLERGIES:  is allergic to norvasc [amlodipine besylate], augmentin [amoxicillin-pot clavulanate], bystolic [nebivolol hcl], coq10 [coenzyme q10], cozaar [losartan], farxiga [dapagliflozin], glucotrol [glipizide], januvia [sitagliptin], keflex [cephalexin], lipitor [atorvastatin calcium], lipitor [atorvastatin], lopid [gemfibrozil], omnicef [cefdinir], prilosec [omeprazole], toprol xl [metoprolol], vibra-tab [doxycycline], zestril [lisinopril], zetia [ezetimibe], zocor [simvastatin], and neurontin [gabapentin].  MEDICATIONS:  Current Outpatient Medications  Medication Sig Dispense Refill   acetaminophen (TYLENOL) 500 MG tablet Take 1,000 mg by mouth every 8 (eight) hours as needed for moderate pain, fever or headache.     albuterol (VENTOLIN HFA) 108 (90 Base) MCG/ACT inhaler Inhale 1 puff into the lungs every 4 (four) hours as needed for wheezing or shortness of breath.     amiodarone (PACERONE) 200 MG tablet Take 0.5 tablets (100 mg total) by mouth daily. 45 tablet 1   apixaban (ELIQUIS) 5 MG TABS tablet Take 1 tablet (5 mg total) by mouth 2 (two) times daily. 60  tablet 0   ASCORBIC ACID PO Take 1 tablet by mouth daily. Vitamin C.     cetirizine (ZYRTEC) 10 MG tablet Take 10 mg by mouth daily as needed for allergies.     Cholecalciferol (VITAMIN D-3 PO) Take 1 capsule by mouth daily.     Cyanocobalamin (VITAMIN B12) 1000 MCG TBCR Take 1 tablet by mouth daily.     docusate sodium (COLACE) 100 MG capsule Take 100 mg by mouth daily.      esomeprazole (NEXIUM 24HR) 20 MG capsule Take 20 mg by mouth daily at 12 noon.     ferrous sulfate 325 (65 FE) MG tablet Take 1 tablet (325 mg total) by mouth every other day. 15 tablet 0   fexofenadine (ALLEGRA) 180 MG tablet Take 180 mg by mouth daily.     FIBER PO Take 3 tablets by mouth daily.     fludrocortisone (FLORINEF) 0.1 MG tablet Take 0.1 mg by mouth daily.     fluticasone (FLONASE) 50 MCG/ACT nasal spray Place 1 spray into both nostrils daily as needed for allergies.     glimepiride (AMARYL) 2 MG tablet Take 2 mg by mouth daily with breakfast.     metFORMIN (GLUCOPHAGE) 500 MG tablet Take 500 mg by mouth 2 (two) times daily with a meal.     midodrine (PROAMATINE) 10 MG tablet Take 1 tablet (10 mg total) by mouth 3 (three) times daily with meals. While awake. Do not lay down for 3-4 hours after taking 270 tablet 3   Multiple Vitamins-Minerals (ZINC PO) Take 1 tablet by mouth daily.     Polyethyl Glycol-Propyl Glycol (SYSTANE OP) Place 1 drop into both eyes 2 (two) times daily as needed (dryness, irritation.).     Pyridoxine HCl (VITAMIN B-6 PO) Take 1 tablet by mouth daily.     rosuvastatin (CRESTOR) 5 MG tablet Take 5 mg by mouth every evening.     No current facility-administered medications for this visit.    REVIEW OF SYSTEMS:   10 Point review of Systems was done is negative except as noted above.   PHYSICAL EXAMINATION:  Vitals:   06/10/23 1202  BP: (!) 120/50  Pulse: 73  Resp: 18  Temp: 97.7 F (36.5 C)  SpO2: 100%    Filed Weights   06/10/23 1202  Weight: 163 lb 3.2 oz (74 kg)     NAD GENERAL:alert, in no acute distress and comfortable SKIN: no acute rashes, no significant lesions EYES: conjunctiva are pink and  non-injected, sclera anicteric OROPHARYNX: MMM, no exudates, no oropharyngeal erythema or ulceration NECK: supple, no JVD LYMPH:  no palpable lymphadenopathy in the cervical, axillary or inguinal regions LUNGS: clear to auscultation b/l with normal respiratory effort HEART: regular rate & rhythm ABDOMEN:  normoactive bowel sounds , non tender, not distended.  No palpable splenomegaly noted. Extremity: no pedal edema PSYCH: alert & oriented x 3 with fluent speech NEURO: no focal motor/sensory deficits LABORATORY DATA:   I have reviewed the data as listed  .    Latest Ref Rng & Units 06/10/2023   11:47 AM 05/24/2023   10:48 AM 05/23/2023   11:42 AM  CBC  WBC 4.0 - 10.5 K/uL 119.2  75.8  90.1   Hemoglobin 13.0 - 17.0 g/dL 9.7  8.2  8.1   Hematocrit 39.0 - 52.0 % 31.2  25.5  25.4   Platelets 150 - 400 K/uL 104  105  122     . CBC    Component Value Date/Time   WBC 75.8 (HH) 05/24/2023 1048   RBC 2.54 (L) 05/24/2023 1048   HGB 8.2 (L) 05/24/2023 1048   HGB 10.6 (L) 01/07/2023 1157   HCT 25.5 (L) 05/24/2023 1048   PLT 105 (L) 05/24/2023 1048   PLT 99 (L) 01/07/2023 1157   MCV 100.4 (H) 05/24/2023 1048   MCH 32.3 05/24/2023 1048   MCHC 32.2 05/24/2023 1048   RDW 15.1 05/24/2023 1048   LYMPHSABS 65.0 (H) 05/24/2023 1048   MONOABS 9.5 (H) 05/24/2023 1048   EOSABS 0.0 05/24/2023 1048   BASOSABS 0.3 (H) 05/24/2023 1048   .Marland Kitchen Lab Results  Component Value Date   RETICCTPCT 1.4 04/24/2018   RBC 2.54 (L) 05/24/2023       Latest Ref Rng & Units 06/10/2023   11:47 AM 05/24/2023   10:48 AM 05/23/2023   11:42 AM  CMP  Glucose 70 - 99 mg/dL 161  096  045   BUN 8 - 23 mg/dL 25  14  19    Creatinine 0.61 - 1.24 mg/dL 4.09  8.11  9.14   Sodium 135 - 145 mmol/L 133  135  134   Potassium 3.5 - 5.1 mmol/L 4.6  4.2  4.5   Chloride 98 - 111 mmol/L 99   103  101   CO2 22 - 32 mmol/L 25  24  21    Calcium 8.9 - 10.3 mg/dL 78.2  9.0  9.1   Total Protein 6.5 - 8.1 g/dL 7.0  5.7    Total Bilirubin 0.0 - 1.2 mg/dL 0.4  0.4    Alkaline Phos 38 - 126 U/L 113  102    AST 15 - 41 U/L 16  18    ALT 0 - 44 U/L 13  13     . Lab Results  Component Value Date   LDH 123 06/10/2023   Component     Latest Ref Rng & Units 06/13/2017  HCV Ab     0.0 - 0.9 s/co ratio <0.1  Hepatitis B Surface Ag     Negative Negative  Hep B Core Ab, Tot     Negative Negative         RADIOGRAPHIC STUDIES: I have personally reviewed the radiological images as listed and agreed with the findings in the report. ECHOCARDIOGRAM COMPLETE Result Date: 05/20/2023    ECHOCARDIOGRAM REPORT   Patient Name:   Eric Harrington Date of Exam: 05/20/2023 Medical Rec #:  [id number]     Height:  69.0 in Accession #:    [id number]    Weight:       157.0 lb Date of Birth:  [birth year]     BSA:          1.864 m Patient Age:    82 years      BP:           131/72 mmHg Patient Gender: M             HR:           87 bpm. Exam Location:  Inpatient Procedure: 2D Echo, Cardiac Doppler and Color Doppler Indications:    Atrial Fibrillation I48.91  History:        Patient has prior history of Echocardiogram examinations, most                 recent 10/26/2022. Stroke and CKD, stage 3, Arrythmias:Atrial                 Fibrillation, Signs/Symptoms:Hypotension; Risk                 Factors:Hypertension, Diabetes and Dyslipidemia.  Sonographer:    Lucendia Herrlich RCS Referring Phys: [id number] RONDELL A SMITH IMPRESSIONS  1. Left ventricular ejection fraction, by estimation, is 55 to 60%. The left ventricle has normal function. The left ventricle has no regional wall motion abnormalities. Left ventricular diastolic parameters are indeterminate.  2. Right ventricular systolic function is normal. The right ventricular size is normal. There is normal pulmonary artery systolic pressure.  3. The mitral valve is  degenerative. Trivial mitral valve regurgitation. No evidence of mitral stenosis.  4. The aortic valve is tricuspid. Aortic valve regurgitation is trivial. No aortic stenosis is present.  5. The inferior vena cava is dilated in size with >50% respiratory variability, suggesting right atrial pressure of 8 mmHg. FINDINGS  Left Ventricle: Left ventricular ejection fraction, by estimation, is 55 to 60%. The left ventricle has normal function. The left ventricle has no regional wall motion abnormalities. The left ventricular internal cavity size was normal in size. There is  no left ventricular hypertrophy. Left ventricular diastolic parameters are indeterminate. Indeterminate filling pressures. Right Ventricle: The right ventricular size is normal. No increase in right ventricular wall thickness. Right ventricular systolic function is normal. There is normal pulmonary artery systolic pressure. The tricuspid regurgitant velocity is 2.43 m/s, and  with an assumed right atrial pressure of 8 mmHg, the estimated right ventricular systolic pressure is 31.6 mmHg. Left Atrium: Left atrial size was normal in size. Right Atrium: Right atrial size was normal in size. Pericardium: There is no evidence of pericardial effusion. Mitral Valve: The mitral valve is degenerative in appearance. There is mild thickening of the mitral valve leaflet(s). Trivial mitral valve regurgitation. No evidence of mitral valve stenosis. Tricuspid Valve: The tricuspid valve is normal in structure. Tricuspid valve regurgitation is trivial. No evidence of tricuspid stenosis. Aortic Valve: The aortic valve is tricuspid. Aortic valve regurgitation is trivial. No aortic stenosis is present. Aortic valve peak gradient measures 6.1 mmHg. Pulmonic Valve: The pulmonic valve was normal in structure. Pulmonic valve regurgitation is not visualized. No evidence of pulmonic stenosis. Aorta: The aortic root is normal in size and structure. Venous: The inferior vena  cava is dilated in size with greater than 50% respiratory variability, suggesting right atrial pressure of 8 mmHg. IAS/Shunts: No atrial level shunt detected by color flow Doppler.  LEFT VENTRICLE PLAX 2D LVIDd:  4.40 cm   Diastology LVIDs:         3.15 cm   LV e' medial:    9.32 cm/s LV PW:         1.10 cm   LV E/e' medial:  13.0 LV IVS:        0.90 cm   LV e' lateral:   9.32 cm/s LVOT diam:     2.10 cm   LV E/e' lateral: 13.0 LV SV:         48 LV SV Index:   26 LVOT Area:     3.46 cm  RIGHT VENTRICLE            IVC RV S prime:     8.70 cm/s  IVC diam: 2.10 cm TAPSE (M-mode): 1.7 cm LEFT ATRIUM             Index        RIGHT ATRIUM           Index LA diam:        3.80 cm 2.04 cm/m   RA Area:     11.00 cm LA Vol (A2C):   55.3 ml 29.67 ml/m  RA Volume:   17.50 ml  9.39 ml/m LA Vol (A4C):   43.1 ml 23.12 ml/m LA Biplane Vol: 51.8 ml 27.79 ml/m  AORTIC VALVE AV Area (Vmax): 2.25 cm AV Vmax:        123.00 cm/s AV Peak Grad:   6.1 mmHg LVOT Vmax:      79.87 cm/s LVOT Vmean:     54.200 cm/s LVOT VTI:       0.139 m  AORTA Ao Root diam: 3.70 cm Ao Asc diam:  3.30 cm MITRAL VALVE                TRICUSPID VALVE MV Area (PHT): 4.57 cm     TR Peak grad:   23.6 mmHg MV Decel Time: 166 msec     TR Vmax:        243.00 cm/s MV E velocity: 121.00 cm/s                             SHUNTS                             Systemic VTI:  0.14 m                             Systemic Diam: 2.10 cm Chilton Si MD Electronically signed by Chilton Si MD Signature Date/Time: 05/20/2023/6:30:10 PM    Final    DG Chest Port 1 View Result Date: 05/20/2023 CLINICAL DATA:  Atrial fibrillation. EXAM: PORTABLE CHEST 1 VIEW COMPARISON:  October 25, 2022. FINDINGS: Stable cardiomediastinal silhouette. Left lung is clear. Minimal right basilar subsegmental atelectasis is noted. Bony thorax is unremarkable. IMPRESSION: Minimal right basilar subsegmental atelectasis. Electronically Signed   By: Lupita Raider M.D.   On: 05/20/2023  12:31    ASSESSMENT & PLAN:   83 y.o. is a male with  1. Chronic lymphocytic leukemia. Likely Rai Stage 0, CLL FISH panel with 13 q. deletion  -he presented with Lymphocytosis incidentally noted on routine labs No associated significant anemia or thrombocytopenia. No constitutional symptoms No overt clinically palpable LNadenopathy or hepato-splenomegaly. 13q mutation present   08/28/17 CT C/A/P revealed Normal  sized spleen, no enlarged lymph nodes. There is evidence of very small lung nodule, likely related to inflammatory process. Will monitor with CT chest in 12 months    PLAN: -Discussed lab results from today, 06/10/2023, in detail with the patient. CBC shows elevated WBC of 119.2 K, low hemoglobin of 9.7 g/dL with hematocrit of 60.1%, and low platelets of 104 K. CMP shows elevated creatinine of 1.94, elevated BUN of 25, and slightly low sodium level of 133.  -recommend to continue to follow-up with Cardiologist for continued evaluation and mx of P afib -recommend to call the Neurologist office regarding referral for headaches and TIA.   -Recommended to stay hydrated.   -Patient has no clear lab or clinical evidence of CLL progression needing treatment for his CLL at this time. -Continue age-appropriate vaccinations and follow-up. -Continue oral iron supplement, Eliquis, and other medication as prescribed.  -Answered all of patient's questions.  -Discussed the option of IV Iron if iron labs shows iron deficiency during this visit. We will try to add iron lab panel. Ferritin 100 with iron saturation of 18%-- no indication for IV iron at this time. -Recommend to stay well-hydrated. Around 2 L water daily.  -Recommend to follow-up with PCP for medication management.  FOLLOW-UP: RTC with Dr Candise Che with labs in 3 months  The total time spent in the appointment was 30 minutes* .  All of the patient's questions were answered with apparent satisfaction. The patient knows to call the  clinic with any problems, questions or concerns.   Wyvonnia Lora MD MS AAHIVMS [facility] Hematology/Oncology Physician [facility]  .*Total Encounter Time as defined by the Centers for Medicare and Medicaid Services includes, in addition to the face-to-face time of a patient visit (documented in the note above) non-face-to-face time: obtaining and reviewing outside history, ordering and reviewing medications, tests or procedures, care coordination (communications with other health care professionals or caregivers) and documentation in the medical record.   I,Param Shah,acting as a Neurosurgeon for Wyvonnia Lora, MD.,have documented all relevant documentation on the behalf of Wyvonnia Lora, MD,as directed by  Wyvonnia Lora, MD while in the presence of Wyvonnia Lora, MD.  .I have reviewed the above documentation for accuracy and completeness, and I agree with the above. Eric Maine MD

## 2023-06-11 LAB — CUP PACEART REMOTE DEVICE CHECK
Date Time Interrogation Session: 20250120080143
Implantable Pulse Generator Implant Date: 20240716
Pulse Gen Serial Number: 511029620

## 2023-06-14 NOTE — Progress Notes (Signed)
Carelink Summary Report / Loop Recorder

## 2023-06-17 ENCOUNTER — Telehealth: Payer: Self-pay | Admitting: Hematology

## 2023-06-17 NOTE — Telephone Encounter (Signed)
Scheduled appointments per scheduling message. Patient is aware of the made appointments and will be mailed an appointment reminder.

## 2023-06-24 ENCOUNTER — Ambulatory Visit (HOSPITAL_COMMUNITY): Payer: Medicare Other | Admitting: Physician Assistant

## 2023-06-25 ENCOUNTER — Telehealth: Payer: Self-pay

## 2023-06-25 ENCOUNTER — Telehealth: Payer: Self-pay | Admitting: Cardiology

## 2023-06-25 NOTE — Telephone Encounter (Signed)
Patient has dropped off a large yellow clasp document envelope for Dr. Jacinto Halim today.    I will place in his mail box today.    Thank you.

## 2023-06-25 NOTE — Telephone Encounter (Signed)
Paperwork is for Eliquis assistance.  Provider page completed and signed by Dr Jacinto Halim.  All paperwork faxed to med assistance team

## 2023-06-25 NOTE — Telephone Encounter (Signed)
Paperwork received. Thank you! 

## 2023-06-25 NOTE — Telephone Encounter (Addendum)
 PAP: Application for Eliquis  has been submitted to [employer] ([employer]), via fax. If patient requests an update please refer them to [employer] at [phone]

## 2023-07-11 ENCOUNTER — Ambulatory Visit (INDEPENDENT_AMBULATORY_CARE_PROVIDER_SITE_OTHER): Payer: Medicare Other

## 2023-07-11 DIAGNOSIS — I480 Paroxysmal atrial fibrillation: Secondary | ICD-10-CM | POA: Diagnosis not present

## 2023-07-11 LAB — CUP PACEART REMOTE DEVICE CHECK
Date Time Interrogation Session: 20250220035653
Implantable Pulse Generator Implant Date: 20240716
Pulse Gen Serial Number: 511029620

## 2023-07-11 NOTE — Progress Notes (Unsigned)
Electrophysiology Office Note:    Date:  07/12/2023   ID:  Eric, Harrington November 23, 1940, MRN [id number]  [facility] Cardiologist:  Yates Decamp, MD  [facility] Electrophysiologist:  Lanier Prude, MD   Referring MD: Yates Decamp, MD   Chief Complaint: Atrial fibrillation  History of Present Illness:    Mr. Eric Harrington is an 83 year old man who I am seeing today for an evaluation of atrial fibrillation at the request of Dr. Jacinto Halim.  The patient has a history of paroxysmal atrial fibrillation, TIA, CKD stage III, CLL complicated by anemia and thrombocytopenia, hyperlipidemia, hypertension, diabetes.  The patient has a history of TIA in June 2024.  He was hospitalized in December with atrial fibrillation with rapid ventricular rates.  He was started back on Eliquis and amiodarone.  He is with his wife today in clinic.  He tells me that he was highly symptomatic when in atrial fibrillation.  He thought he may die.  He has his wife called 911 immediately.  He is interested in avoiding future episodes of atrial fibrillation if at all possible.  He is fairly active.  He does walk with a cane.  He does not use supplemental oxygen.  He is interested in avoiding long-term exposure to Eliquis or other blood thinners given his thrombocytopenia and anemia.     Their past medical, social and family history was reviewed.   ROS:   Please see the history of present illness.    All other systems reviewed and are negative.  EKGs/Labs/Other Studies Reviewed:    The following studies were reviewed today:  May 20, 2023 echo EF 55-60 RV normal Trivial MR Trivial AI  June 10, 2023 EKG shows sinus rhythm, right bundle branch block       Physical Exam:    VS:  BP 138/68   Pulse 67   Ht 5\' 9"  (1.753 m)   Wt 170 lb (77.1 kg)   SpO2 99%   BMI 25.10 kg/m     Wt Readings from Last 3 Encounters:  07/12/23 170 lb (77.1 kg)  06/10/23 163 lb 3.2 oz (74 kg)  06/10/23 159 lb (72.1 kg)      GEN: no distress CARD: RRR, No MRG RESP: No IWOB. CTAB.        ASSESSMENT AND PLAN:    No diagnosis found.  #Atrial fibrillation #High risk drug use-amiodarone The patient has atrial fibrillation managed with amiodarone.  He has CLL complicated by anemia and thrombocytopenia.  He is being referred for consideration of left atrial appendage occlusion to help reduce his stroke risk and avoid long-term exposure to anticoagulation.  I discussed the Watchman procedure in detail with the patient including the risks, recovery and likelihood of success.  He wishes to continue with evaluation.  -------------  Discussed treatment options today for AF including antiarrhythmic drug therapy and ablation. Discussed risks, recovery and likelihood of success with each treatment strategy. Risk, benefits, and alternatives to EP study and ablation for afib were discussed. These risks include but are not limited to stroke, bleeding, vascular damage, tamponade, perforation, damage to the esophagus, lungs, phrenic nerve and other structures, pulmonary vein stenosis, worsening renal function, coronary vasospasm and death.  Discussed potential need for repeat ablation procedures and antiarrhythmic drugs after an initial ablation. The patient understands these risk and wishes to proceed.  We will therefore proceed with catheter ablation at the next available time.  Carto, ICE, anesthesia are requested for the procedure.    Plan  for TEE on the table the day of the ablation to exclude left atrial appendage thrombus.  ----------------  I have seen Eric Harrington in the office today who is being considered for a Watchman left atrial appendage closure device. I believe they will benefit from this procedure given their history of atrial fibrillation, CHA2DS2-VASc score of 6 and unadjusted ischemic stroke rate of 9.7% per year. Unfortunately, the patient is not felt to be a long term anticoagulation candidate secondary  to thrombocytopenia and anemia. The patient's chart has been reviewed and I feel that they would be a candidate for short term oral anticoagulation after Watchman implant.   It is my belief that after undergoing a LAA closure procedure, Eric Harrington will not need long term anticoagulation which eliminates anticoagulation side effects and major bleeding risk.   Procedural risks for the Watchman implant have been reviewed with the patient including a 0.5% risk of stroke, <1% risk of perforation and <1% risk of device embolization. Other risks include bleeding, vascular damage, tamponade, worsening renal function, and death. The patient understands these risk and wishes to proceed.     The published clinical data on the safety and effectiveness of WATCHMAN include but are not limited to the following: - Holmes DR, Everlene Farrier, Sick P et al. for the PROTECT AF Investigators. Percutaneous closure of the left atrial appendage versus warfarin therapy for prevention of stroke in patients with atrial fibrillation: a randomised non-inferiority trial. Lancet 2009; 374: 534-42. Everlene Farrier, Doshi SK, Isa Rankin D et al. on behalf of the PROTECT AF Investigators. Percutaneous Left Atrial Appendage Closure for Stroke Prophylaxis in Patients With Atrial Fibrillation 2.3-Year Follow-up of the PROTECT AF (Watchman Left Atrial Appendage System for Embolic Protection in Patients With Atrial Fibrillation) Trial. Circulation 2013; 127:720-729. - Alli O, Doshi S,  Kar S, Reddy VY, Sievert H et al. Quality of Life Assessment in the Randomized PROTECT AF (Percutaneous Closure of the Left Atrial Appendage Versus Warfarin Therapy for Prevention of Stroke in Patients With Atrial Fibrillation) Trial of Patients at Risk for Stroke With Nonvalvular Atrial Fibrillation. J Am Coll Cardiol 2013; 61:1790-8. Aline August DR, Mia Creek, Petrucci M, Whisenant B, Sievert H, Doshi S, Huber K, Reddy V. Prospective randomized evaluation of the Watchman  left atrial appendage Device in patients with atrial fibrillation versus long-term warfarin therapy; the PREVAIL trial. Journal of the [employer] of Cardiology, Vol. 4, No. 1, 2014, 1-11. - Kar S, Doshi SK, Sadhu A, Horton R, Osorio J et al. Primary outcome evaluation of a next-generation left atrial appendage closure device: results from the PINNACLE FLX trial. Circulation 2021;143(18)1754-1762.    After today's visit with the patient which was dedicated solely for shared decision making visit regarding LAA closure device, the patient decided to proceed with the LAA appendage closure procedure scheduled to be done in the near future at [facility].   HAS-BLED score 3 Hypertension Yes  Abnormal renal and liver function (Dialysis, transplant, Cr >2.26 mg/dL /Cirrhosis or Bilirubin >2x Normal or AST/ALT/AP >3x Normal) No  Stroke Yes  Bleeding No  Labile INR (Unstable/high INR) No  Elderly (>65) Yes  Drugs or alcohol (>= 8 drinks/week, anti-plt or NSAID) No   CHA2DS2-VASc Score = 6  The patient's score is based upon: CHF History: 0 HTN History: 1 Diabetes History: 1 Stroke History: 2 Vascular Disease History: 0 Age Score: 2 Gender Score: 0  Recent thyroid and liver function tests within normal limits.  He has an appointment with his oncologist in late April to review his lab work.  Will tentatively schedule his catheter ablation for sometime in May with the understanding that if his CLL has changed status, would need to reassess ablation/watchman timeline.     Signed, Rossie Muskrat. Lalla Brothers, MD, [facility], [facility] 07/12/2023 3:23 PM    Electrophysiology [facility]

## 2023-07-12 ENCOUNTER — Ambulatory Visit: Payer: Medicare Other | Attending: Cardiology | Admitting: Cardiology

## 2023-07-12 ENCOUNTER — Encounter: Payer: Self-pay | Admitting: Cardiology

## 2023-07-12 VITALS — BP 138/68 | HR 67 | Ht 69.0 in | Wt 170.0 lb

## 2023-07-12 DIAGNOSIS — I480 Paroxysmal atrial fibrillation: Secondary | ICD-10-CM | POA: Diagnosis not present

## 2023-07-12 NOTE — Patient Instructions (Addendum)
Medication Instructions:  Your physician recommends that you continue on your current medications as directed. Please refer to the Current Medication list given to you today.  *If you need a refill on your cardiac medications before your next appointment, please call your pharmacy*  Lab Work: BMET and CBC - please go to any [facility] location anytime after April 8th to have your pre-procedure labs drawn  Testing/Procedures: Ablation  Your physician has recommended that you have an ablation. Catheter ablation is a medical procedure used to treat some cardiac arrhythmias (irregular heartbeats). During catheter ablation, a long, thin, flexible tube is put into a blood vessel in your groin (upper thigh), or neck. This tube is called an ablation catheter. It is then guided to your heart through the blood vessel. Radio frequency waves destroy small areas of heart tissue where abnormal heartbeats may cause an arrhythmia to start. You are scheduled for Atrial Fibrillation Ablation on Thursday, May 8 with Dr. Steffanie Dunn.Please arrive at the Main Entrance A at [facility]: [address] [place], [place] [address] at 10:30 AM    [facility]  Your physician has requested that you have Left atrial appendage (LAA) closure device implantation is a procedure to put a small device in the LAA of the heart. The LAA is a small sac in the wall of the heart's left upper chamber. Blood clots can form in this area. The device, Watchman closes the LAA to help prevent a blood clot and stroke.  You will be contacted by Nurse Navigator, Karsten Fells to schedule your pre-procedure visit and procedure date. If you have any questions she can be reached at [phone].    Follow-Up: At [facility], you and your health needs are our priority.  As part of our continuing mission to provide you with exceptional heart care, we have created designated Provider Care Teams.  These Care Teams include your primary  Cardiologist (physician) and Advanced Practice Providers (APPs -  Physician Assistants and Nurse Practitioners) who all work together to provide you with the care you need, when you need it.  Your next appointment:   We will call you to schedule your follow up appointments

## 2023-07-18 NOTE — Addendum Note (Signed)
 Addended by: Geralyn Flash D on: 07/18/2023 03:49 PM   Modules accepted: Orders

## 2023-07-18 NOTE — Progress Notes (Signed)
 [employer]

## 2023-07-25 NOTE — Telephone Encounter (Signed)
 PAP: Patient has been denied for patient assistance by [employer] ([employer]) due to 3% OOP spending not met. Letter was sent to patient.

## 2023-08-06 ENCOUNTER — Ambulatory Visit: Payer: Medicare Other | Admitting: Neurology

## 2023-08-12 ENCOUNTER — Ambulatory Visit (INDEPENDENT_AMBULATORY_CARE_PROVIDER_SITE_OTHER): Payer: Medicare Other

## 2023-08-12 DIAGNOSIS — I480 Paroxysmal atrial fibrillation: Secondary | ICD-10-CM

## 2023-08-12 LAB — CUP PACEART REMOTE DEVICE CHECK
Date Time Interrogation Session: 20250324080628
Implantable Pulse Generator Implant Date: 20240716
Pulse Gen Model: 5000
Pulse Gen Serial Number: 511029620

## 2023-08-14 NOTE — Progress Notes (Signed)
 Carelink Summary Report / Loop Recorder

## 2023-08-16 ENCOUNTER — Ambulatory Visit: Payer: Medicare Other | Admitting: Cardiology

## 2023-08-17 ENCOUNTER — Other Ambulatory Visit (HOSPITAL_COMMUNITY): Payer: Self-pay

## 2023-08-17 ENCOUNTER — Encounter: Payer: Self-pay | Admitting: Cardiology

## 2023-08-22 ENCOUNTER — Ambulatory Visit: Payer: Medicare Other | Admitting: Neurology

## 2023-09-03 ENCOUNTER — Other Ambulatory Visit (HOSPITAL_COMMUNITY): Payer: Self-pay

## 2023-09-03 ENCOUNTER — Telehealth (HOSPITAL_COMMUNITY): Payer: Self-pay

## 2023-09-03 DIAGNOSIS — I480 Paroxysmal atrial fibrillation: Secondary | ICD-10-CM

## 2023-09-03 NOTE — Telephone Encounter (Signed)
 Spoke with patient to complete pre-procedure call.     New medical conditions? No Recent hospitalizations or surgeries? No Started any new medications? No Patient made aware to contact office to inform of any new medications started. Any changes in activities of daily living? No  Pre-procedure testing scheduled: CT not needed per Dr. Marven Slimmer and lab work ordered.  Confirmed patient is taking Eliquis and will continue taking medication before procedure or it may need to be rescheduled.  Confirmed patient is scheduled for Atrial Fibrillation Ablation on Thursday, May 8 with Dr. Harvie Liner. Instructed patient to arrive at the Main Entrance A at [facility]: [address] [place], [place] [address] and check in at Admitting at 10:30 AM.  Advised of plan to go home the same day and will only stay overnight if medically necessary. You MUST have a responsible adult to drive you home and MUST be with you the first 24 hours after you arrive home or your procedure could be cancelled.  Patient verbalized understanding to information provided and is agreeable to proceed with procedure.

## 2023-09-09 ENCOUNTER — Encounter: Payer: Self-pay | Admitting: Cardiology

## 2023-09-09 ENCOUNTER — Ambulatory Visit: Payer: Medicare Other | Attending: Cardiology | Admitting: Cardiology

## 2023-09-09 ENCOUNTER — Other Ambulatory Visit: Payer: Self-pay | Admitting: Cardiology

## 2023-09-09 VITALS — BP 110/50 | HR 83 | Resp 16 | Ht 69.0 in | Wt 161.0 lb

## 2023-09-09 DIAGNOSIS — I10 Essential (primary) hypertension: Secondary | ICD-10-CM

## 2023-09-09 DIAGNOSIS — I951 Orthostatic hypotension: Secondary | ICD-10-CM | POA: Diagnosis not present

## 2023-09-09 DIAGNOSIS — I480 Paroxysmal atrial fibrillation: Secondary | ICD-10-CM

## 2023-09-09 NOTE — Progress Notes (Unsigned)
 Cardiology Office Note:  .   Date:  09/09/2023  ID:  Eric Harrington, DOB 07-Apr-1941, MRN [id number] PCP: Dorena Gander, MD  [facility] Providers Cardiologist:  Knox Perl, MD Electrophysiologist:  Boyce Byes, MD { Click to update primary MD,subspecialty MD or APP then REFRESH:1}  History of Present Illness: .    Discussed the use of AI scribe software for clinical note transcription with the patient, who gave verbal consent to proceed.  History of Present Illness     Eric Harrington is a 83 y.o. parox AFib, severe orthostatic hypotension on midodrine  and florinef  due to near syncopal spells, TIA, CKD stage 3, CLL with anemia with chronic thrombocytopenia, HLD, HTN, T2DM, recent ACDF (04/2023), ILR in situ.   Eliquis  was discontinued after mutual discussion in view of thrombocytopenia and orthostatic hypotension in May 2024 but presented with TIA with transient aphasia and June 2024. he was started back on Eliquis  and also on amiodarone .  He was seen by my partner Dr. Harvie Liner for consideration for Watchman device implantation in view of risk of fall and also thrombocytopenia and risk of bleeding.Aaron Aas  He is now being planned for A-fib ablation followed by Watchman device implantation.  He is accompanied by his wife and presents for follow-up.  Recently has noticed his blood pressure to be dropping again since fludrocortisone  was discontinued during recent hospitalization when he was hypertensive and in A-fib with RVR and also in heart failure.  His wife has restarted fludrocortisone  back and patient has started to improve with regard to his energy levels and also fatigue.  Labs   Lab Results  Component Value Date   CHOL 130 10/26/2022   HDL 22 (L) 10/26/2022   LDLCALC 56 10/26/2022   TRIG 258 (H) 10/26/2022   CHOLHDL 5.9 10/26/2022   Lab Results  Component Value Date   NA 133 (L) 06/10/2023   K 4.6 06/10/2023   CO2 25 06/10/2023   GLUCOSE 113 (H) 06/10/2023    BUN 25 (H) 06/10/2023   CREATININE 1.94 (H) 06/10/2023   CALCIUM  10.1 06/10/2023   GFRNONAA 34 (L) 06/10/2023      Latest Ref Rng & Units 06/10/2023   11:47 AM 05/24/2023   10:48 AM 05/23/2023   11:42 AM  BMP  Glucose 70 - 99 mg/dL 409  811  914   BUN 8 - 23 mg/dL 25  14  19    Creatinine 0.61 - 1.24 mg/dL 7.82  9.56  2.13   Sodium 135 - 145 mmol/L 133  135  134   Potassium 3.5 - 5.1 mmol/L 4.6  4.2  4.5   Chloride 98 - 111 mmol/L 99  103  101   CO2 22 - 32 mmol/L 25  24  21    Calcium  8.9 - 10.3 mg/dL 08.6  9.0  9.1       Latest Ref Rng & Units 06/10/2023   11:47 AM 05/24/2023   10:48 AM 05/23/2023   11:42 AM  CBC  WBC 4.0 - 10.5 K/uL 119.2  75.8  90.1   Hemoglobin 13.0 - 17.0 g/dL 9.7  8.2  8.1   Hematocrit 39.0 - 52.0 % 31.2  25.5  25.4   Platelets 150 - 400 K/uL 104  105  122    Lab Results  Component Value Date   HGBA1C 6.9 (H) 04/23/2023    Lab Results  Component Value Date   TSH 0.542 05/20/2023    External Labs:  ***  ***  ROS Physical Exam:   VS:  BP (!) 110/50 (BP Location: Left Arm, Patient Position: Standing, Cuff Size: Normal)   Pulse 83   Resp 16   Ht 5\' 9"  (1.753 m)   Wt 161 lb (73 kg)   SpO2 98%   BMI 23.78 kg/m    Wt Readings from Last 3 Encounters:  09/09/23 161 lb (73 kg)  07/12/23 170 lb (77.1 kg)  06/10/23 163 lb 3.2 oz (74 kg)     ***Physical Exam Studies Reviewed: .    *** EKG:         ***  Medications and allergies    Allergies  Allergen Reactions   Norvasc [Amlodipine Besylate] Rash    Fatigue  Fever  Chills    Augmentin [Amoxicillin -Pot Clavulanate] Other (See Comments)    Stomach upset   Bystolic [Nebivolol Hcl] Other (See Comments)    Weakness    Coq10 [Coenzyme Q10] Other (See Comments)    Unknown reaction   Cozaar [Losartan] Other (See Comments)    Unknown reaction   Farxiga [Dapagliflozin] Other (See Comments)    Excessive urination Dehydration   Glucotrol [Glipizide] Other (See Comments)    Unknown reaction    Januvia [Sitagliptin] Other (See Comments)    Unknown reaction   Keflex [Cephalexin] Other (See Comments)    Unknown reaction   Lipitor [Atorvastatin Calcium ]    Lipitor [Atorvastatin] Other (See Comments)    Unknown reaction   Lopid [Gemfibrozil] Other (See Comments)    Unknown reaction   Omnicef [Cefdinir] Other (See Comments)    Unknown reaction   Prilosec [Omeprazole] Other (See Comments)    Unknown reaction   Toprol  Xl [Metoprolol ] Other (See Comments)    Weakness    Vibra-Tab [Doxycycline] Other (See Comments)    Fever  Chills   Zestril [Lisinopril] Other (See Comments)    Constipation     Zetia [Ezetimibe] Other (See Comments)    Unknown reaction   Zocor [Simvastatin] Other (See Comments)    Unknown reaction   Neurontin [Gabapentin] Other (See Comments)    Unknown reaction     Current Outpatient Medications:    acetaminophen  (TYLENOL ) 500 MG tablet, Take 1,000 mg by mouth every 8 (eight) hours as needed for moderate pain, fever or headache., Disp: , Rfl:    apixaban  (ELIQUIS ) 5 MG TABS tablet, Take 1 tablet (5 mg total) by mouth 2 (two) times daily., Disp: 60 tablet, Rfl: 0   ASCORBIC ACID  PO, Take 1 tablet by mouth daily. Vitamin C ., Disp: , Rfl:    cetirizine (ZYRTEC) 10 MG tablet, Take 10 mg by mouth daily as needed for allergies., Disp: , Rfl:    Cholecalciferol  (VITAMIN D -3 PO), Take 1 capsule by mouth daily., Disp: , Rfl:    Cyanocobalamin  (VITAMIN B12) 1000 MCG TBCR, Take 1 tablet by mouth daily., Disp: , Rfl:    docusate sodium  (COLACE) 100 MG capsule, Take 100 mg by mouth daily. , Disp: , Rfl:    esomeprazole (NEXIUM 24HR) 20 MG capsule, Take 20 mg by mouth daily at 12 noon., Disp: , Rfl:    fexofenadine (ALLEGRA) 180 MG tablet, Take 180 mg by mouth daily., Disp: , Rfl:    FIBER PO, Take 3 tablets by mouth daily., Disp: , Rfl:    fludrocortisone  (FLORINEF ) 0.1 MG tablet, Take 0.1 mg by mouth daily., Disp: , Rfl:    fluticasone  (FLONASE ) 50 MCG/ACT nasal  spray, Place 1 spray into both nostrils daily as needed for allergies.,  Disp: , Rfl:    glimepiride  (AMARYL ) 2 MG tablet, Take 2 mg by mouth daily with breakfast., Disp: , Rfl:    metFORMIN  (GLUCOPHAGE ) 500 MG tablet, Take 500 mg by mouth 2 (two) times daily with a meal., Disp: , Rfl:    midodrine  (PROAMATINE ) 10 MG tablet, Take 1 tablet (10 mg total) by mouth 3 (three) times daily with meals. While awake. Do not lay down for 3-4 hours after taking, Disp: 270 tablet, Rfl: 3   Multiple Vitamins-Minerals (ZINC PO), Take 1 tablet by mouth daily., Disp: , Rfl:    Polyethyl Glycol-Propyl Glycol (SYSTANE OP), Place 1 drop into both eyes 2 (two) times daily as needed (dryness, irritation.)., Disp: , Rfl:    Pyridoxine  HCl (VITAMIN B-6 PO), Take 1 tablet by mouth daily., Disp: , Rfl:    rosuvastatin  (CRESTOR ) 5 MG tablet, Take 5 mg by mouth every evening., Disp: , Rfl:    amiodarone  (PACERONE ) 200 MG tablet, Take 0.5 tablets (100 mg total) by mouth daily., Disp: 45 tablet, Rfl: 1   ferrous sulfate  325 (65 FE) MG tablet, Take 1 tablet (325 mg total) by mouth every other day., Disp: 15 tablet, Rfl: 0   No orders of the defined types were placed in this encounter.    Medications Discontinued During This Encounter  Medication Reason   albuterol  (VENTOLIN  HFA) 108 (90 Base) MCG/ACT inhaler Completed Course     ASSESSMENT AND PLAN: .      ICD-10-CM   1. Paroxysmal atrial fibrillation (HCC)  I48.0     2. Orthostatic hypotension  I95.1     3. Supine hypertension  I10       Assessment and Plan Assessment & Plan       1. Paroxysmal atrial fibrillation (HCC) ***  2. Orthostatic hypotension ***  3. Supine hypertension ***   Signed,  Knox Perl, MD, [facility] 09/09/2023, 12:24 PM [facility] [address] [place], [place] [address] Phone: [phone]. Fax:  [phone]

## 2023-09-09 NOTE — Patient Instructions (Signed)
 Medication Instructions:  Your physician recommends that you continue on your current medications as directed. Please refer to the Current Medication list given to you today.  *If you need a refill on your cardiac medications before your next appointment, please call your pharmacy*  Lab Work: none If you have labs (blood work) drawn today and your tests are completely normal, you will receive your results only by: MyChart Message (if you have MyChart) OR A paper copy in the mail If you have any lab test that is abnormal or we need to change your treatment, we will call you to review the results.  Testing/Procedures: none  Follow-Up: At [facility], you and your health needs are our priority.  As part of our continuing mission to provide you with exceptional heart care, our providers are all part of one team.  This team includes your primary Cardiologist (physician) and Advanced Practice Providers or APPs (Physician Assistants and Nurse Practitioners) who all work together to provide you with the care you need, when you need it.  Your next appointment:   6 month(s)  Provider:   Yates Decamp, MD     We recommend signing up for the patient portal called "MyChart".  Sign up information is provided on this After Visit Summary.  MyChart is used to connect with patients for Virtual Visits (Telemedicine).  Patients are able to view lab/test results, encounter notes, upcoming appointments, etc.  Non-urgent messages can be sent to your provider as well.   To learn more about what you can do with MyChart, go to [email or web address].   Other Instructions        1st Floor: - Lobby - Registration  - Pharmacy  - Lab - Cafe  2nd Floor: - PV Lab - Diagnostic Testing (echo, CT, nuclear med)  3rd Floor: - Vacant  4th Floor: - TCTS (cardiothoracic surgery) - AFib Clinic - Structural Heart Clinic - Vascular Surgery  - Vascular Ultrasound  5th Floor: - HeartCare Cardiology  (general and EP) - Clinical Pharmacy for coumadin, hypertension, lipid, weight-loss medications, and med management appointments    Valet parking services will be available as well.

## 2023-09-10 ENCOUNTER — Telehealth: Payer: Self-pay

## 2023-09-10 ENCOUNTER — Other Ambulatory Visit: Payer: Self-pay | Admitting: Cardiology

## 2023-09-10 DIAGNOSIS — I951 Orthostatic hypotension: Secondary | ICD-10-CM

## 2023-09-10 NOTE — Telephone Encounter (Signed)
 Left message for patient to call back. Per Dr. Marven Slimmer patient's ablation will need to be cancelled due to drop in hemoglobin and platelets. He would like to scheduled a virtual visit with the patient tomorrow to discuss next steps.

## 2023-09-10 NOTE — Progress Notes (Unsigned)
  Electrophysiology Office Follow up Visit Note:    Date:  09/10/2023   ID:  Eric Harrington, DOB August 01, 1940, MRN [id number]  PCP:  Dorena Gander, MD  [facility] Cardiologist:  Knox Perl, MD  [facility] Electrophysiologist:  Boyce Byes, MD    Interval History:     Eric Harrington is a 83 y.o. male who presents for a follow up visit.   I last saw the patient July 12, 2023 for atrial fibrillation at the request of Dr. Berry Bristol.  The patient has CLL complicated by anemia and thrombocytopenia.  The patient also has a history of TIA.  He is on Eliquis  and amiodarone .  After the last appointment we tentatively plan for a concomitant watchman and ablation procedure to treat his atrial fibrillation and allow him to avoid long-term exposure to anticoagulation.  Recent blood work showed worsening of his baseline anemia with a hemoglobin less than 8.  Platelets were also less than 100,000.  For these reasons he is not felt to be a candidate for ablation and watchman.        Past medical, surgical, social and family history were reviewed.  ROS:   Please see the history of present illness.    All other systems reviewed and are negative.  EKGs/Labs/Other Studies Reviewed:    The following studies were reviewed today:  Lab work reviewed        Physical Exam:    VS:  There were no vitals taken for this visit.    Wt Readings from Last 3 Encounters:  09/09/23 161 lb (73 kg)  07/12/23 170 lb (77.1 kg)  06/10/23 163 lb 3.2 oz (74 kg)     GEN: no distress CARD: RRR, No MRG RESP: No IWOB. CTAB.      ASSESSMENT:    No diagnosis found. PLAN:    In order of problems listed above: #Atrial fibrillation #CLL #Anemia #Thrombocytopenia The patient has atrial fibrillation and is currently managed with amiodarone  and Eliquis .  Originally, ablation and watchman were being considered.  Unfortunately, recent blood work is showing a worsening thrombocytopenia and anemia.  I do  not think proceeding with catheter ablation and watchman implant is a safe option.  I recommend continuing with medical therapy alone.  This was discussed at length with the patient during today's visit.  Follow-up 6 months with EP APP.    Signed, Harvie Liner, MD, [facility], [facility] 09/10/2023 5:31 PM    Electrophysiology [facility]

## 2023-09-10 NOTE — Progress Notes (Signed)
 ICD-10-CM   1. Orthostatic hypotension  I95.1      Refilled Fludrocortisone 

## 2023-09-10 NOTE — Telephone Encounter (Signed)
 Patient was returning call. Please advise ?

## 2023-09-10 NOTE — Telephone Encounter (Signed)
 Spoke with the patient and his wife. Appointment has been scheduled for tomorrow with Dr. Marven Slimmer.

## 2023-09-11 ENCOUNTER — Ambulatory Visit: Attending: Cardiology | Admitting: Cardiology

## 2023-09-11 DIAGNOSIS — C9110 Chronic lymphocytic leukemia of B-cell type not having achieved remission: Secondary | ICD-10-CM

## 2023-09-11 DIAGNOSIS — I480 Paroxysmal atrial fibrillation: Secondary | ICD-10-CM

## 2023-09-11 DIAGNOSIS — Z79899 Other long term (current) drug therapy: Secondary | ICD-10-CM | POA: Diagnosis not present

## 2023-09-11 LAB — BASIC METABOLIC PANEL WITH GFR
BUN/Creatinine Ratio: 14 (ref 10–24)
BUN: 21 mg/dL (ref 8–27)
CO2: 22 mmol/L (ref 20–29)
Calcium: 9.7 mg/dL (ref 8.6–10.2)
Chloride: 97 mmol/L (ref 96–106)
Creatinine, Ser: 1.5 mg/dL — ABNORMAL HIGH (ref 0.76–1.27)
Glucose: 120 mg/dL — ABNORMAL HIGH (ref 70–99)
Potassium: 3.8 mmol/L (ref 3.5–5.2)
Sodium: 134 mmol/L (ref 134–144)
eGFR: 47 mL/min/{1.73_m2} — ABNORMAL LOW (ref >59)

## 2023-09-11 LAB — CBC
Hematocrit: 26 % — ABNORMAL LOW (ref 37.5–51.0)
Hemoglobin: 7.8 g/dL — ABNORMAL LOW (ref 13.0–17.7)
MCH: 31.1 pg (ref 26.6–33.0)
MCHC: 30 g/dL — ABNORMAL LOW (ref 31.5–35.7)
MCV: 104 fL — ABNORMAL HIGH (ref 79–97)
Platelets: 93 10*3/uL — CL (ref 150–450)
RBC: 2.51 x10E6/uL — CL (ref 4.14–5.80)
RDW: 14.8 % (ref 11.6–15.4)
WBC: 135.9 10*3/uL (ref 3.4–10.8)

## 2023-09-11 NOTE — Patient Instructions (Signed)
 Medication Instructions:  Your physician recommends that you continue on your current medications as directed. Please refer to the Current Medication list given to you today.  *If you need a refill on your cardiac medications before your next appointment, please call your pharmacy* Follow-Up: At [facility], you and your health needs are our priority.  As part of our continuing mission to provide you with exceptional heart care, our providers are all part of one team.  This team includes your primary Cardiologist (physician) and Advanced Practice Providers or APPs (Physician Assistants and Nurse Practitioners) who all work together to provide you with the care you need, when you need it.  Your next appointment:   3-4 months  Provider:   You will see one of the following Advanced Practice Providers on your designated Care Team:   Mertha Abrahams, [place] Bambi Lever "Jonelle Neri" [place], PA-C Adaline Holly, NP Creighton Doffing, NP       1st Floor: - Lobby - Registration  - Pharmacy  - Lab - Cafe  2nd Floor: - PV Lab - Diagnostic Testing (echo, CT, nuclear med)  3rd Floor: - Vacant  4th Floor: - TCTS (cardiothoracic surgery) - AFib Clinic - Structural Heart Clinic - Vascular Surgery  - Vascular Ultrasound  5th Floor: - HeartCare Cardiology (general and EP) - Clinical Pharmacy for coumadin, hypertension, lipid, weight-loss medications, and med management appointments    Valet parking services will be available as well.

## 2023-09-12 ENCOUNTER — Ambulatory Visit (INDEPENDENT_AMBULATORY_CARE_PROVIDER_SITE_OTHER): Payer: Medicare Other

## 2023-09-12 DIAGNOSIS — I480 Paroxysmal atrial fibrillation: Secondary | ICD-10-CM

## 2023-09-12 LAB — CUP PACEART REMOTE DEVICE CHECK
Date Time Interrogation Session: 20250424090131
Implantable Pulse Generator Implant Date: 20240716
Pulse Gen Model: 5000
Pulse Gen Serial Number: 511029620

## 2023-09-14 ENCOUNTER — Encounter: Payer: Self-pay | Admitting: Cardiology

## 2023-09-16 ENCOUNTER — Other Ambulatory Visit: Payer: Self-pay

## 2023-09-16 DIAGNOSIS — C9110 Chronic lymphocytic leukemia of B-cell type not having achieved remission: Secondary | ICD-10-CM

## 2023-09-17 ENCOUNTER — Telehealth: Payer: Self-pay

## 2023-09-17 ENCOUNTER — Inpatient Hospital Stay: Payer: Medicare Other | Attending: Hematology | Admitting: Hematology

## 2023-09-17 ENCOUNTER — Telehealth: Payer: Self-pay | Admitting: Pharmacy Technician

## 2023-09-17 ENCOUNTER — Inpatient Hospital Stay

## 2023-09-17 ENCOUNTER — Other Ambulatory Visit: Payer: Self-pay

## 2023-09-17 ENCOUNTER — Other Ambulatory Visit (HOSPITAL_COMMUNITY): Payer: Self-pay

## 2023-09-17 ENCOUNTER — Inpatient Hospital Stay: Payer: Medicare Other

## 2023-09-17 VITALS — BP 148/61 | HR 61 | Temp 97.4°F | Resp 16 | Ht 68.0 in | Wt 162.9 lb

## 2023-09-17 DIAGNOSIS — Z7901 Long term (current) use of anticoagulants: Secondary | ICD-10-CM | POA: Insufficient documentation

## 2023-09-17 DIAGNOSIS — Z7984 Long term (current) use of oral hypoglycemic drugs: Secondary | ICD-10-CM | POA: Insufficient documentation

## 2023-09-17 DIAGNOSIS — D649 Anemia, unspecified: Secondary | ICD-10-CM

## 2023-09-17 DIAGNOSIS — I4891 Unspecified atrial fibrillation: Secondary | ICD-10-CM | POA: Insufficient documentation

## 2023-09-17 DIAGNOSIS — E1122 Type 2 diabetes mellitus with diabetic chronic kidney disease: Secondary | ICD-10-CM | POA: Diagnosis not present

## 2023-09-17 DIAGNOSIS — R21 Rash and other nonspecific skin eruption: Secondary | ICD-10-CM | POA: Diagnosis not present

## 2023-09-17 DIAGNOSIS — C9110 Chronic lymphocytic leukemia of B-cell type not having achieved remission: Secondary | ICD-10-CM

## 2023-09-17 DIAGNOSIS — R1030 Lower abdominal pain, unspecified: Secondary | ICD-10-CM | POA: Diagnosis not present

## 2023-09-17 DIAGNOSIS — Z87891 Personal history of nicotine dependence: Secondary | ICD-10-CM | POA: Diagnosis not present

## 2023-09-17 DIAGNOSIS — I10 Essential (primary) hypertension: Secondary | ICD-10-CM | POA: Diagnosis not present

## 2023-09-17 DIAGNOSIS — R5383 Other fatigue: Secondary | ICD-10-CM | POA: Insufficient documentation

## 2023-09-17 DIAGNOSIS — Z801 Family history of malignant neoplasm of trachea, bronchus and lung: Secondary | ICD-10-CM | POA: Insufficient documentation

## 2023-09-17 DIAGNOSIS — R630 Anorexia: Secondary | ICD-10-CM | POA: Insufficient documentation

## 2023-09-17 DIAGNOSIS — K219 Gastro-esophageal reflux disease without esophagitis: Secondary | ICD-10-CM | POA: Insufficient documentation

## 2023-09-17 DIAGNOSIS — R0982 Postnasal drip: Secondary | ICD-10-CM | POA: Insufficient documentation

## 2023-09-17 DIAGNOSIS — E119 Type 2 diabetes mellitus without complications: Secondary | ICD-10-CM | POA: Insufficient documentation

## 2023-09-17 DIAGNOSIS — N183 Chronic kidney disease, stage 3 unspecified: Secondary | ICD-10-CM | POA: Insufficient documentation

## 2023-09-17 DIAGNOSIS — R053 Chronic cough: Secondary | ICD-10-CM | POA: Diagnosis not present

## 2023-09-17 DIAGNOSIS — E785 Hyperlipidemia, unspecified: Secondary | ICD-10-CM | POA: Insufficient documentation

## 2023-09-17 DIAGNOSIS — Z79899 Other long term (current) drug therapy: Secondary | ICD-10-CM | POA: Insufficient documentation

## 2023-09-17 LAB — CMP (CANCER CENTER ONLY)
ALT: 10 U/L (ref 0–44)
AST: 15 U/L (ref 15–41)
Albumin: 4.4 g/dL (ref 3.5–5.0)
Alkaline Phosphatase: 102 U/L (ref 38–126)
Anion gap: 8 (ref 5–15)
BUN: 22 mg/dL (ref 8–23)
CO2: 29 mmol/L (ref 22–32)
Calcium: 9.7 mg/dL (ref 8.9–10.3)
Chloride: 98 mmol/L (ref 98–111)
Creatinine: 1.49 mg/dL — ABNORMAL HIGH (ref 0.61–1.24)
GFR, Estimated: 47 mL/min — ABNORMAL LOW (ref >60)
Glucose, Bld: 117 mg/dL — ABNORMAL HIGH (ref 70–99)
Potassium: 3.7 mmol/L (ref 3.5–5.1)
Sodium: 135 mmol/L (ref 135–145)
Total Bilirubin: 0.5 mg/dL (ref 0.0–1.2)
Total Protein: 6.4 g/dL — ABNORMAL LOW (ref 6.5–8.1)

## 2023-09-17 LAB — CBC WITH DIFFERENTIAL (CANCER CENTER ONLY)
Abs Immature Granulocytes: 0.05 10*3/uL (ref 0.00–0.07)
Basophils Absolute: 0.1 10*3/uL (ref 0.0–0.1)
Basophils Relative: 0 %
Eosinophils Absolute: 0 10*3/uL (ref 0.0–0.5)
Eosinophils Relative: 0 %
HCT: 26.2 % — ABNORMAL LOW (ref 39.0–52.0)
Hemoglobin: 8 g/dL — ABNORMAL LOW (ref 13.0–17.0)
Immature Granulocytes: 0 %
Lymphocytes Relative: 94 %
Lymphs Abs: 123.7 10*3/uL — ABNORMAL HIGH (ref 0.7–4.0)
MCH: 31.1 pg (ref 26.0–34.0)
MCHC: 30.5 g/dL (ref 30.0–36.0)
MCV: 101.9 fL — ABNORMAL HIGH (ref 80.0–100.0)
Monocytes Absolute: 7 10*3/uL — ABNORMAL HIGH (ref 0.1–1.0)
Monocytes Relative: 5 %
Neutro Abs: 0.8 10*3/uL — ABNORMAL LOW (ref 1.7–7.7)
Neutrophils Relative %: 1 %
Platelet Count: 72 10*3/uL — ABNORMAL LOW (ref 150–400)
RBC: 2.57 MIL/uL — ABNORMAL LOW (ref 4.22–5.81)
RDW: 15.8 % — ABNORMAL HIGH (ref 11.5–15.5)
Smear Review: NORMAL
WBC Count: 131.7 10*3/uL (ref 4.0–10.5)
nRBC: 0 % (ref 0.0–0.2)

## 2023-09-17 LAB — FERRITIN: Ferritin: 178 ng/mL (ref 24–336)

## 2023-09-17 LAB — SAMPLE TO BLOOD BANK

## 2023-09-17 LAB — IRON AND IRON BINDING CAPACITY (CC-WL,HP ONLY)
Iron: 77 ug/dL (ref 45–182)
Saturation Ratios: 21 % (ref 17.9–39.5)
TIBC: 364 ug/dL (ref 250–450)
UIBC: 287 ug/dL (ref 117–376)

## 2023-09-17 LAB — LACTATE DEHYDROGENASE: LDH: 130 U/L (ref 98–192)

## 2023-09-17 LAB — URIC ACID: Uric Acid, Serum: 5.6 mg/dL (ref 3.7–8.6)

## 2023-09-17 MED ORDER — VENETOCLAX 10 & 50 & 100 MG PO TBPK
ORAL_TABLET | ORAL | 0 refills | Status: DC
  Filled 2023-09-18: qty 42, 28d supply, fill #0

## 2023-09-17 NOTE — Telephone Encounter (Signed)
 Oral Oncology Pharmacist Encounter  Received new prescription for Venclexta (venetoclax) for the treatment of symptomatic CLL, planned duration until disease progression or unacceptable toxicity. Will start Gazyva if needed per MD.   Labs from 09/17/23 (CBC, CMP) assessed, no interventions needed. Patient may need allopurinol due to patients kidney function to make sure he does not get tumor lysis syndrome.  Patient will get weekly labs once we get started on the medication. Patient and patients wife understood in clinic and we discussed how the medication works. Additionally, got patients signature for patient assistance if needed. Prescription dose and frequency assessed for appropriateness.   Current medication list in Epic reviewed, DDIs with Venetoclax identified: - amiodarone  (cat D): interaction as P-gp inhibitors increase the concentration of venetoclax. The dose of venetoclax will need to be decreased by 50% if patient is still on amiodarone . Based on last fill, looks like patient is on 1/2 tablet daily of amiodarone . Will discuss with MD.  - glimepiride  and metformin (cat C): venetoclax may decrease the therapeutic effect of these antidiabetic agents. Will have patient monitor his blood sugars while on this medication.  Evaluated chart and no patient barriers to medication adherence noted.   Prescription has been e-scribed to the [facility] for benefits analysis and approval.  Oral Oncology Clinic will continue to follow for insurance authorization, copayment issues, initial counseling and start date.  Cameron Katayama, PharmD Hematology/Oncology Clinical Pharmacist [facility] [phone] 09/17/2023 1:42 PM

## 2023-09-17 NOTE — Telephone Encounter (Signed)
 Oral Oncology Patient Advocate Encounter  After completing a benefits investigation, prior authorization for Venclexta is not required at this time through [place].  Patient's copay is $926.51.     Roda Cirri, CPhT Specialty Pharmacy Patient Advocate Phone: [phone] Fax: [phone]

## 2023-09-17 NOTE — Progress Notes (Signed)
 HEMATOLOGY ONCOLOGY CLINIC NOTE  Date of Service:  09/17/23    Patient Care Team: Dorena Gander, MD as PCP - General (Family Medicine) Knox Perl, MD as PCP - Cardiology (Cardiology) Boyce Byes, MD as PCP - Electrophysiology (Cardiology) Frankie Israel, MD as Consulting Physician (Hematology)  CHIEF COMPLAINTS/PURPOSE OF CONSULTATION:  Continued evaluation and management of CLL  HISTORY OF PRESENTING ILLNESS:   Eric Harrington 83 y.o. male is here because of a referral from Dr. Randle Butler from [facility] at [facility] regarding a trend in his elevated WBC.   He is accompanied today by his wife of 54 years. The pt reports that he is doing well overall. He recently had a biopsy to check his prostate at [facility] with Dr. Ozella Blush and they took 12 core samples with reportedly no significant findings.  Of note prior to the patient's visit today, pt has had CBC completed on 04/26/17 with results revealing WBC at 14.7, Hgb at 12.6, Lymph Abs at 11.0 with all other values WNL. On 03/26/17 his WBC were 12.8, Hgb at 13.3 and Lymph's at  9.90. On 02/06/17 his WBC were 12.7, Hgb at 13.4, and Lymph's at 8.80.   On review of systems, pt reports post nasal drip, persistent cough, occasional troublesome stomach, pain in his lower abdomen that feels like his muscles are irritated, reports he has lost 25 lbs in the last 2.5 years (he associates a decreased appetite after taking beta blockers during this period) and denies no recent colds or infections, acute changes in energy levels, and leg swelling.   On PMHx the pt reports taking Zetia. He has IB'S and has had 3 colonoscopies with no significant findings or inflammatory processes. He reports that over the last 2.5 years he had heart catheterizations that all turned out well. He reports having diabetes. He also reports neuropathy along his whole right side that lasted for a few months that ceased abruptly.   Interval History:   Eric Harrington is a wonderful gentleman who is here for continued evaluation and management of CLL.  Patient was last seen by me on 06/10/2023 and he complained of mild fatigue/lethargy and mild appetite loss.   Patient is accompanied by his wife during this visit. Patient notes he has been doing fairly well since our last visit. He regularly follows-up with his Cardiologist regarding A-Fib and is planning for ablation.   He complains of fatigue, night sweats, enlarged lymph node on his left neck, body weakness, mild constipation, abdominal pain, and light headiness. He notes that his fatigue has been causing him to walk less than usual. Patient does report occasional severe drenching night sweats.   He denies stroke-like symptoms during this visit. His blood pressure during this visit is 148/61 with pulse rate of 61.   He denies any new infection issues, fever, chills,, unexpected weight loss, back pain, chest pain, SOB, or leg swelling.   Patient notes he had severe reaction, which included skin rash, to his influenza vaccine, which he received in October.    MEDICAL HISTORY:  1. HTN 2. DM2 3. H/o PAC's 4. transient a fib with cardiac cath 5. HLD 6. CKD stage 3 7. Irritable bowel syndrome 8. GERD 9. H/o presyndope  SURGICAL HISTORY:  1. Prostate bx 2. Cardiac cath  SOCIAL HISTORY: Social History   Socioeconomic History   Marital status: Married    Spouse name: Not on file   Number of children: 2   Years  of education: Not on file   Highest education level: Not on file  Occupational History   Not on file  Tobacco Use   Smoking status: Former    Current packs/day: 0.00    Average packs/day: 1 pack/day for 30.0 years (30.0 ttl pk-yrs)    Types: Cigarettes    Start date: 37    Quit date: 35    Years since quitting: 45.3   Smokeless tobacco: Never   Tobacco comments:    started at age 64  Vaping Use   Vaping status: Never Used  Substance and Sexual Activity   Alcohol  use: Never   Drug use: Not Currently   Sexual activity: Not Currently    Birth control/protection: None  Other Topics Concern   Not on file  Social History Narrative   2 Cups of Coffee   Right Handed    Social Drivers of Health   Financial Resource Strain: Low Risk  (05/20/2023)   Overall Financial Resource Strain (CARDIA)    Difficulty of Paying Living Expenses: Not hard at all  Food Insecurity: No Food Insecurity (05/20/2023)   Hunger Vital Sign    Worried About Running Out of Food in the Last Year: Never true    Ran Out of Food in the Last Year: Never true  Transportation Needs: No Transportation Needs (05/20/2023)   PRAPARE - Administrator, Civil Service (Medical): No    Lack of Transportation (Non-Medical): No  Physical Activity: Inactive (05/20/2023)   Exercise Vital Sign    Days of Exercise per Week: 0 days    Minutes of Exercise per Session: 0 min  Stress: No Stress Concern Present (05/20/2023)   [employer] of Occupational Health - Occupational Stress Questionnaire    Feeling of Stress : Only a little  Social Connections: Moderately Integrated (05/20/2023)   Social Connection and Isolation Panel [NHANES]    Frequency of Communication with Friends and Family: More than three times a week    Frequency of Social Gatherings with Friends and Family: Twice a week    Attends Religious Services: More than 4 times per year    Active Member of [employer] or Organizations: No    Attends Banker Meetings: Never    Marital Status: Married  Catering manager Violence: Not At Risk (05/20/2023)   Humiliation, Afraid, Rape, and Kick questionnaire    Fear of Current or Ex-Partner: No    Emotionally Abused: No    Physically Abused: No    Sexually Abused: No    FAMILY HISTORY: Family History  Problem Relation Age of Onset   Lung cancer Mother    Heart attack Father    Dementia Sister    Diabetes Brother    Diabetes Sister    Heart attack Brother      ALLERGIES:  is allergic to norvasc [amlodipine besylate], augmentin [amoxicillin -pot clavulanate], bystolic [nebivolol hcl], coq10 [coenzyme q10], cozaar [losartan], farxiga [dapagliflozin], glucotrol [glipizide], januvia [sitagliptin], keflex [cephalexin], lipitor [atorvastatin calcium ], lipitor [atorvastatin], lopid [gemfibrozil], omnicef [cefdinir], prilosec [omeprazole], toprol  xl [metoprolol ], vibra-tab [doxycycline], zestril [lisinopril], zetia [ezetimibe], zocor [simvastatin], and neurontin [gabapentin].  MEDICATIONS:  Current Outpatient Medications  Medication Sig Dispense Refill   acetaminophen  (TYLENOL ) 500 MG tablet Take 1,000 mg by mouth every 8 (eight) hours as needed for moderate pain, fever or headache.     amiodarone  (PACERONE ) 200 MG tablet Take 0.5 tablets (100 mg total) by mouth daily. 45 tablet 1   apixaban  (ELIQUIS ) 5 MG TABS tablet  Take 1 tablet (5 mg total) by mouth 2 (two) times daily. 60 tablet 0   ASCORBIC ACID  PO Take 1 tablet by mouth daily. Vitamin C .     cetirizine (ZYRTEC) 10 MG tablet Take 10 mg by mouth daily as needed for allergies.     Cholecalciferol  (VITAMIN D -3 PO) Take 1 capsule by mouth daily.     Cyanocobalamin  (VITAMIN B12) 1000 MCG TBCR Take 1 tablet by mouth daily.     docusate sodium  (COLACE) 100 MG capsule Take 100 mg by mouth daily.      esomeprazole (NEXIUM 24HR) 20 MG capsule Take 20 mg by mouth daily at 12 noon.     ferrous sulfate  325 (65 FE) MG tablet Take 1 tablet (325 mg total) by mouth every other day. 15 tablet 0   fexofenadine (ALLEGRA) 180 MG tablet Take 180 mg by mouth daily.     FIBER PO Take 3 tablets by mouth daily.     fludrocortisone  (FLORINEF ) 0.1 MG tablet Take 1 tablet (100 mcg total) by mouth every evening. 90 tablet 1   fluticasone  (FLONASE ) 50 MCG/ACT nasal spray Place 1 spray into both nostrils daily as needed for allergies.     glimepiride  (AMARYL ) 2 MG tablet Take 2 mg by mouth daily with breakfast.     metFORMIN   (GLUCOPHAGE ) 500 MG tablet Take 500 mg by mouth 2 (two) times daily with a meal.     midodrine  (PROAMATINE ) 10 MG tablet Take 1 tablet (10 mg total) by mouth 3 (three) times daily with meals. While awake. Do not lay down for 3-4 hours after taking 270 tablet 3   Multiple Vitamins-Minerals (ZINC PO) Take 1 tablet by mouth daily.     Polyethyl Glycol-Propyl Glycol (SYSTANE OP) Place 1 drop into both eyes 2 (two) times daily as needed (dryness, irritation.).     Pyridoxine  HCl (VITAMIN B-6 PO) Take 1 tablet by mouth daily.     rosuvastatin  (CRESTOR ) 5 MG tablet Take 5 mg by mouth every evening.     No current facility-administered medications for this visit.    REVIEW OF SYSTEMS:   10 Point review of Systems was done is negative except as noted above.   PHYSICAL EXAMINATION:  Vitals:   09/17/23 1212  BP: (!) 148/61  Pulse: 61  Resp: 16  Temp: (!) 97.4 F (36.3 C)  SpO2: 100%     Filed Weights   09/17/23 1212  Weight: 162 lb 14.4 oz (73.9 kg)     NAD GENERAL:alert, in no acute distress and comfortable SKIN: no acute rashes, no significant lesions EYES: conjunctiva are pink and non-injected, sclera anicteric OROPHARYNX: MMM, no exudates, no oropharyngeal erythema or ulceration NECK: supple, no JVD LYMPH:  no palpable lymphadenopathy in the cervical, axillary or inguinal regions LUNGS: clear to auscultation b/l with normal respiratory effort HEART: regular rate & rhythm ABDOMEN:  normoactive bowel sounds , non tender, not distended.  No palpable splenomegaly noted. Extremity: no pedal edema PSYCH: alert & oriented x 3 with fluent speech NEURO: no focal motor/sensory deficits LABORATORY DATA:   I have reviewed the data as listed  .    Latest Ref Rng & Units 09/17/2023   11:47 AM 09/09/2023    1:30 PM 06/10/2023   11:47 AM  CBC  WBC 4.0 - 10.5 K/uL 131.7  135.9  119.2   Hemoglobin 13.0 - 17.0 g/dL 8.0  7.8  9.7   Hematocrit 39.0 - 52.0 % 26.2  26.0  31.2  Platelets  150 - 400 K/uL 72  93  104     . CBC    Component Value Date/Time   WBC 135.9 (HH) 09/09/2023 1330   WBC 119.2 (HH) 06/10/2023 1147   WBC 75.8 (HH) 05/24/2023 1048   RBC 2.51 (LL) 09/09/2023 1330   RBC 3.02 (L) 06/10/2023 1147   HGB 7.8 (L) 09/09/2023 1330   HCT 26.0 (L) 09/09/2023 1330   PLT 93 (LL) 09/09/2023 1330   MCV 104 (H) 09/09/2023 1330   MCH 31.1 09/09/2023 1330   MCH 32.1 06/10/2023 1147   MCHC 30.0 (L) 09/09/2023 1330   MCHC 31.1 06/10/2023 1147   RDW 14.8 09/09/2023 1330   LYMPHSABS 110.6 (H) 06/10/2023 1147   MONOABS 7.5 (H) 06/10/2023 1147   EOSABS 0.0 06/10/2023 1147   BASOSABS 0.1 06/10/2023 1147   .[facility] A[facility] Lab Results  Component Value Date   RETICCTPCT 1.4 04/24/2018   RBC 2.51 (LL) 09/09/2023       Latest Ref Rng & Units 09/09/2023    1:30 PM 06/10/2023   11:47 AM 05/24/2023   10:48 AM  CMP  Glucose 70 - 99 mg/dL 161  096  045   BUN 8 - 27 mg/dL 21  25  14    Creatinine 0.76 - 1.27 mg/dL 4.09  8.11  9.14   Sodium 134 - 144 mmol/L 134  133  135   Potassium 3.5 - 5.2 mmol/L 3.8  4.6  4.2   Chloride 96 - 106 mmol/L 97  99  103   CO2 20 - 29 mmol/L 22  25  24    Calcium  8.6 - 10.2 mg/dL 9.7  78.2  9.0   Total Protein 6.5 - 8.1 g/dL  7.0  5.7   Total Bilirubin 0.0 - 1.2 mg/dL  0.4  0.4   Alkaline Phos 38 - 126 U/L  113  102   AST 15 - 41 U/L  16  18   ALT 0 - 44 U/L  13  13    . Lab Results  Component Value Date   LDH 123 06/10/2023   Component     Latest Ref Rng & Units 06/13/2017  HCV Ab     0.0 - 0.9 s/co ratio <0.1  Hepatitis B Surface Ag     Negative Negative  Hep B Core Ab, Tot     Negative Negative         RADIOGRAPHIC STUDIES: I have personally reviewed the radiological images as listed and agreed with the findings in the report. CUP PACEART REMOTE DEVICE CHECK Result Date: 09/12/2023 ILR summary report received. Battery status OK. Normal device function. No new tachy, brady, or pause episodes. No new AF episodes. Monthly summary  reports and ROV/PRN 1 symptom activation, SR with 5sec of tachy HR ~130 bpm LA, CVRS   ASSESSMENT & PLAN:   83 y.o. is a male with  1. Chronic lymphocytic leukemia. Likely Rai Stage 0, CLL FISH panel with 13 q. deletion  -he presented with Lymphocytosis incidentally noted on routine labs No associated significant anemia or thrombocytopenia. No constitutional symptoms No overt clinically palpable LNadenopathy or hepato-splenomegaly. 13q mutation present   08/28/17 CT C/A/P revealed Normal sized spleen, no enlarged lymph nodes. There is evidence of very small lung nodule, likely related to inflammatory process. Will monitor with CT chest in 12 months    PLAN: -Discussed lab results from today, 09/17/2023, in detail with the patient. CBC shows elevated but improved WBC of 131.7 K,  low but improved Hgb of 8.0 g/dL, low Hct of 78.4%, and low Platelets of 72 K. CMP abd stable. -Discussed the option of blood transfusion due to his symptoms. Pt agrees.  -Schedule patient for blood transfusion.  -Discussed with the patient that we will need to start CLL treatment due to progression.  -Discussed different treatment options for CLL. Discussed with the patient with his medical history, the treatment would be only one regimen treatment of Venetoclax .  -Discussed with the patient that chemotherapy is not recommended due to his medical history.  -Educated the patient about side effects of Venetoclax , which may include mild diarrhea and nausea.  -Discussed with the patient that he will also be prescribed acyclovir for gout prevention.  -Discussed the option of PET scan for baseline before starting treatment. Pt agrees.  -Schedule PET scan.  -Plan: 1. Blood transfusion. 2. Schedule pt for PET scan.  2. Start Venetoclax .  -Answered all of patient's questions in detail.  -Discussed with the patient that if labs show low iron level, there might be a option of IV Iron infusion or Oral Iron supplement.   -recommend to continue to follow-up with Cardiologist for continued evaluation and mx of P afib -Recommended to stay hydrated.   -Continue oral iron supplement, Eliquis , and other medication as prescribed.  -Answered all of patient's questions.   FOLLOW-UP: 1 unit of PRBC in 1-2 days Starting Venetoclax   Will need weekly labs x 4 once he starts venetoclax  and MD visit at 2 weeks and 4 weeks.   The total time spent in the appointment was 32 minutes* .  All of the patient's questions were answered with apparent satisfaction. The patient knows to call the clinic with any problems, questions or concerns.   Jacquelyn Matt MD MS AAHIVMS [facility] Hematology/Oncology Physician [facility]  .*Total Encounter Time as defined by the Centers for Medicare and Medicaid Services includes, in addition to the face-to-face time of a patient visit (documented in the note above) non-face-to-face time: obtaining and reviewing outside history, ordering and reviewing medications, tests or procedures, care coordination (communications with other health care professionals or caregivers) and documentation in the medical record.   I,Param Shah,acting as a Neurosurgeon for Jacquelyn Matt, MD.,have documented all relevant documentation on the behalf of Jacquelyn Matt, MD,as directed by  Jacquelyn Matt, MD while in the presence of Jacquelyn Matt, MD.  .I have reviewed the above documentation for accuracy and completeness, and I agree with the above. .Skyllar Notarianni Kishore Joycie Aerts MD

## 2023-09-17 NOTE — Telephone Encounter (Signed)
 CRITICAL VALUE STICKER  CRITICAL VALUE:  WBC 131.7  RECEIVER (on-site recipient of call): Arbie Knock, LPN  DATE & TIME NOTIFIED: 12:14  09/17/23  MESSENGER (representative from lab): Camilo Cella  MD NOTIFIED: Marlea Silvers, MD  TIME OF NOTIFICATION: 12:15  RESPONSE:

## 2023-09-17 NOTE — Telephone Encounter (Signed)
 Oral Oncology Patient Advocate Encounter   Was successful in securing patient an $73 grant from [employer] [facility]) to provide copayment coverage for Venclexta.  This will keep the out of pocket expense at $0.     The billing information is as follows and has been shared with [facility].   Member ID: [id number] Group ID: [id number] RxBin: [id number] PCN: [id number] Dates of Eligibility: 06/18/23 through 09/14/24  Fund:  Chronic Lymphocytic Leukemia  Roda Cirri, CPhT Specialty Pharmacy Patient Advocate Phone: [phone] Fax: [phone]

## 2023-09-18 ENCOUNTER — Other Ambulatory Visit: Payer: Self-pay

## 2023-09-18 ENCOUNTER — Other Ambulatory Visit (HOSPITAL_COMMUNITY): Payer: Self-pay

## 2023-09-18 ENCOUNTER — Telehealth: Payer: Self-pay | Admitting: Hematology

## 2023-09-18 DIAGNOSIS — C9110 Chronic lymphocytic leukemia of B-cell type not having achieved remission: Secondary | ICD-10-CM

## 2023-09-18 LAB — PREPARE RBC (CROSSMATCH)

## 2023-09-18 NOTE — Progress Notes (Signed)
 Patient counseled in clinic and documented in telephone encounter opened on 09/17/23.   Milburn Freeney, PharmD Hematology/Oncology Clinical Pharmacist [facility] [phone]

## 2023-09-18 NOTE — Progress Notes (Signed)
 Specialty Pharmacy Initial Fill Coordination Note  Eric Harrington is a 83 y.o. male contacted today regarding initial fill of specialty medication(s) Venetoclax Stephani Ege)  Patient requested Delivery   Delivery date: 09/20/23   Verified address: [address], [place], [place] [address]  Medication will be filled on 09/19/23.   Patient is aware of $0.00 copayment. Bill PANF Secondary.    Hansel Ley, CPhT Pharmacy Technician Coordinator [facility] [phone] (Ph) 09/18/2023 1:54 PM

## 2023-09-18 NOTE — Telephone Encounter (Signed)
 Spoke with patient confirming upcoming appointment

## 2023-09-19 ENCOUNTER — Other Ambulatory Visit: Payer: Self-pay

## 2023-09-19 ENCOUNTER — Inpatient Hospital Stay: Attending: Hematology

## 2023-09-19 DIAGNOSIS — Z7984 Long term (current) use of oral hypoglycemic drugs: Secondary | ICD-10-CM | POA: Diagnosis not present

## 2023-09-19 DIAGNOSIS — N183 Chronic kidney disease, stage 3 unspecified: Secondary | ICD-10-CM | POA: Diagnosis not present

## 2023-09-19 DIAGNOSIS — E1122 Type 2 diabetes mellitus with diabetic chronic kidney disease: Secondary | ICD-10-CM | POA: Diagnosis not present

## 2023-09-19 DIAGNOSIS — K219 Gastro-esophageal reflux disease without esophagitis: Secondary | ICD-10-CM | POA: Diagnosis not present

## 2023-09-19 DIAGNOSIS — E785 Hyperlipidemia, unspecified: Secondary | ICD-10-CM | POA: Diagnosis not present

## 2023-09-19 DIAGNOSIS — Z79899 Other long term (current) drug therapy: Secondary | ICD-10-CM | POA: Diagnosis not present

## 2023-09-19 DIAGNOSIS — Z7969 Long term (current) use of other immunomodulators and immunosuppressants: Secondary | ICD-10-CM | POA: Diagnosis not present

## 2023-09-19 DIAGNOSIS — C9110 Chronic lymphocytic leukemia of B-cell type not having achieved remission: Secondary | ICD-10-CM | POA: Insufficient documentation

## 2023-09-19 DIAGNOSIS — Z801 Family history of malignant neoplasm of trachea, bronchus and lung: Secondary | ICD-10-CM | POA: Diagnosis not present

## 2023-09-19 DIAGNOSIS — Z87891 Personal history of nicotine dependence: Secondary | ICD-10-CM | POA: Insufficient documentation

## 2023-09-19 DIAGNOSIS — I4891 Unspecified atrial fibrillation: Secondary | ICD-10-CM | POA: Diagnosis not present

## 2023-09-19 DIAGNOSIS — I10 Essential (primary) hypertension: Secondary | ICD-10-CM | POA: Diagnosis not present

## 2023-09-19 DIAGNOSIS — I129 Hypertensive chronic kidney disease with stage 1 through stage 4 chronic kidney disease, or unspecified chronic kidney disease: Secondary | ICD-10-CM | POA: Insufficient documentation

## 2023-09-19 DIAGNOSIS — Z7901 Long term (current) use of anticoagulants: Secondary | ICD-10-CM | POA: Insufficient documentation

## 2023-09-19 DIAGNOSIS — K589 Irritable bowel syndrome without diarrhea: Secondary | ICD-10-CM | POA: Insufficient documentation

## 2023-09-19 MED ORDER — SODIUM CHLORIDE 0.9% IV SOLUTION
250.0000 mL | INTRAVENOUS | Status: DC
Start: 2023-09-19 — End: 2023-09-19
  Administered 2023-09-19: 100 mL via INTRAVENOUS

## 2023-09-19 MED ORDER — METHYLPREDNISOLONE SODIUM SUCC 40 MG IJ SOLR
40.0000 mg | Freq: Once | INTRAMUSCULAR | Status: AC
  Administered 2023-09-19: 40 mg via INTRAVENOUS
  Filled 2023-09-19: qty 1

## 2023-09-19 MED ORDER — ACETAMINOPHEN 325 MG PO TABS
650.0000 mg | ORAL_TABLET | Freq: Once | ORAL | Status: AC
  Administered 2023-09-19: 650 mg via ORAL
  Filled 2023-09-19: qty 2

## 2023-09-19 NOTE — Patient Instructions (Signed)

## 2023-09-20 ENCOUNTER — Other Ambulatory Visit: Payer: Self-pay

## 2023-09-20 DIAGNOSIS — C9110 Chronic lymphocytic leukemia of B-cell type not having achieved remission: Secondary | ICD-10-CM

## 2023-09-20 LAB — TYPE AND SCREEN
ABO/RH(D): O POS
Antibody Screen: NEGATIVE
Unit division: 0

## 2023-09-20 LAB — BPAM RBC
Blood Product Expiration Date: 202505012359
ISSUE DATE / TIME: 202505011057
Unit Type and Rh: 9500

## 2023-09-20 MED ORDER — ALLOPURINOL 100 MG PO TABS: 100.0000 mg | ORAL_TABLET | Freq: Two times a day (BID) | ORAL | 1 refills | Status: DC

## 2023-09-20 NOTE — Telephone Encounter (Addendum)
 Oral Chemotherapy Pharmacist Encounter  I spoke with patient and patients wife in clinic on 09/17/23 for overview of: Venclexta  (venetoclax ) for the treatment of symptomatic CLL. I also spoke with patient and patients wife after receiving the medication to make sure there were no questions, to inform of start date and to remind when to being hydration.   Venclexta  dose will be titrated up per manufacturer recommendations. Counseled patient on administration, dosing, side effects, monitoring, drug-food interactions, safe handling, storage, and disposal. Patient and patients wife knows to avoid grapefruit, grapefruit juice, star fish, and [place] oranges while on this medication.   Patient will take Venclexta  10mg  tablets, 2 tablets (20 mg) by mouth once daily with food and water for 7 days. Patient and patients wife aware that he should start to increase hydration to 1.5-2L of water per day 48 hours prior to starting the venetoclax .   Venclexta  start date: 09/23/2023  Patient will take Venclexta  50mg  tablets, 1 tablet by mouth once daily with food and water for 7 days. Baseline labs will be assessed and the 1st dose will be administered. CBC, uric acid, and electrolytes will be monitored at prior to each dose of venetoclax  and will be monitored more frequently if patient is having tumor lysis syndrome. Patient aware of appointments already scheduled.   Patient aware that starting the following week he will take Venclexta  50mg  tablets, 1 tablet once daily for 7 days. Subsequent ramp-up doses of 100mg  daily x 7 days, 200mg  daily x 7 days per the package.   Adverse effects include but are not limited to: TLS, decreased blood counts, electrolyte abnormalities, diarrhea, nausea, fatigue, and arthralgias/myalgias.    Patient will obtain anti diarrheal and alert the office of 4 or more loose stools above baseline. Patient will call if he starts having some nausea so taht anti-emetic can be obtained.    Reviewed with patient importance of keeping a medication schedule and plan for any missed doses. No barriers to medication adherence identified.  Medication reconciliation performed and medication/allergy list updated. Patient is still taking the 1/2 tablet of the amiodarone . Due to interaction and patients renal function, MD sent prescription for allopurinol for patient to take for 1-2 months while starting venetoclax . Patient aware prescription was sent to [facility] pharmacy.  All questions answered. Patient and patients wife voiced understanding and appreciation.   Medication education handout placed in mail for patient. Patient knows to call the office with questions or concerns. Oral Chemotherapy Clinic phone number provided to patient.   Carrina Schoenberger, PharmD Hematology/Oncology Clinical Pharmacist [facility] [phone]

## 2023-09-23 ENCOUNTER — Other Ambulatory Visit: Payer: Self-pay

## 2023-09-23 DIAGNOSIS — C9110 Chronic lymphocytic leukemia of B-cell type not having achieved remission: Secondary | ICD-10-CM

## 2023-09-24 ENCOUNTER — Inpatient Hospital Stay

## 2023-09-24 DIAGNOSIS — C9110 Chronic lymphocytic leukemia of B-cell type not having achieved remission: Secondary | ICD-10-CM

## 2023-09-24 LAB — CBC WITH DIFFERENTIAL (CANCER CENTER ONLY)
Abs Immature Granulocytes: 0.02 10*3/uL (ref 0.00–0.07)
Basophils Absolute: 0 10*3/uL (ref 0.0–0.1)
Basophils Relative: 0 %
Eosinophils Absolute: 0 10*3/uL (ref 0.0–0.5)
Eosinophils Relative: 0 %
HCT: 28.8 % — ABNORMAL LOW (ref 39.0–52.0)
Hemoglobin: 9.1 g/dL — ABNORMAL LOW (ref 13.0–17.0)
Immature Granulocytes: 0 %
Lymphocytes Relative: 95 %
Lymphs Abs: 93.2 10*3/uL — ABNORMAL HIGH (ref 0.7–4.0)
MCH: 31.6 pg (ref 26.0–34.0)
MCHC: 31.6 g/dL (ref 30.0–36.0)
MCV: 100 fL (ref 80.0–100.0)
Monocytes Absolute: 3.5 10*3/uL — ABNORMAL HIGH (ref 0.1–1.0)
Monocytes Relative: 4 %
Neutro Abs: 0.7 10*3/uL — ABNORMAL LOW (ref 1.7–7.7)
Neutrophils Relative %: 1 %
Platelet Count: 78 10*3/uL — ABNORMAL LOW (ref 150–400)
RBC: 2.88 MIL/uL — ABNORMAL LOW (ref 4.22–5.81)
RDW: 16.2 % — ABNORMAL HIGH (ref 11.5–15.5)
Smear Review: NORMAL
WBC Count: 97.5 10*3/uL (ref 4.0–10.5)
nRBC: 0 % (ref 0.0–0.2)

## 2023-09-24 LAB — SAMPLE TO BLOOD BANK

## 2023-09-24 LAB — CMP (CANCER CENTER ONLY)
ALT: 11 U/L (ref 0–44)
AST: 15 U/L (ref 15–41)
Albumin: 4.6 g/dL (ref 3.5–5.0)
Alkaline Phosphatase: 103 U/L (ref 38–126)
Anion gap: 7 (ref 5–15)
BUN: 28 mg/dL — ABNORMAL HIGH (ref 8–23)
CO2: 27 mmol/L (ref 22–32)
Calcium: 9.9 mg/dL (ref 8.9–10.3)
Chloride: 100 mmol/L (ref 98–111)
Creatinine: 1.49 mg/dL — ABNORMAL HIGH (ref 0.61–1.24)
GFR, Estimated: 47 mL/min — ABNORMAL LOW (ref >60)
Glucose, Bld: 131 mg/dL — ABNORMAL HIGH (ref 70–99)
Potassium: 3.8 mmol/L (ref 3.5–5.1)
Sodium: 134 mmol/L — ABNORMAL LOW (ref 135–145)
Total Bilirubin: 0.4 mg/dL (ref 0.0–1.2)
Total Protein: 6.7 g/dL (ref 6.5–8.1)

## 2023-09-24 LAB — PHOSPHORUS: Phosphorus: 4.1 mg/dL (ref 2.5–4.6)

## 2023-09-24 LAB — URIC ACID: Uric Acid, Serum: 6.5 mg/dL (ref 3.7–8.6)

## 2023-09-24 LAB — LACTATE DEHYDROGENASE: LDH: 163 U/L (ref 98–192)

## 2023-09-26 ENCOUNTER — Ambulatory Visit (HOSPITAL_COMMUNITY): Admit: 2023-09-26 | Payer: Medicare Other | Admitting: Cardiology

## 2023-09-26 ENCOUNTER — Encounter (HOSPITAL_COMMUNITY): Payer: Self-pay

## 2023-09-26 SURGERY — ATRIAL FIBRILLATION ABLATION
Anesthesia: General

## 2023-09-27 ENCOUNTER — Other Ambulatory Visit: Payer: Self-pay | Admitting: *Deleted

## 2023-09-27 DIAGNOSIS — C9110 Chronic lymphocytic leukemia of B-cell type not having achieved remission: Secondary | ICD-10-CM

## 2023-09-29 NOTE — Progress Notes (Signed)
 HEMATOLOGY ONCOLOGY CLINIC NOTE  Date of Service: 09/30/2023   Patient Care Team: Dorena Gander, MD as PCP - General (Family Medicine) Knox Perl, MD as PCP - Cardiology (Cardiology) Boyce Byes, MD as PCP - Electrophysiology (Cardiology) Frankie Israel, MD as Consulting Physician (Hematology)  CHIEF COMPLAINTS/PURPOSE OF CONSULTATION:  Continued evaluation and management of CLL  HISTORY OF PRESENTING ILLNESS:   Eric Harrington 83 y.o. male is here because of a referral from Dr. Randle Butler from [facility] regarding a trend in his elevated WBC.   He is accompanied today by his wife of 54 years. The pt reports that he is doing well overall. He recently had a biopsy to check his prostate at [facility] with Dr. Ozella Blush and they took 12 core samples with reportedly no significant findings.  Of note prior to the patient's visit today, pt has had CBC completed on 04/26/17 with results revealing WBC at 14.7, Hgb at 12.6, Lymph Abs at 11.0 with all other values WNL. On 03/26/17 his WBC were 12.8, Hgb at 13.3 and Lymph's at  9.90. On 02/06/17 his WBC were 12.7, Hgb at 13.4, and Lymph's at 8.80.   On review of systems, pt reports post nasal drip, persistent cough, occasional troublesome stomach, pain in his lower abdomen that feels like his muscles are irritated, reports he has lost 25 lbs in the last 2.5 years (he associates a decreased appetite after taking beta blockers during this period) and denies no recent colds or infections, acute changes in energy levels, and leg swelling.   On PMHx the pt reports taking Zetia. He has IB'S and has had 3 colonoscopies with no significant findings or inflammatory processes. He reports that over the last 2.5 years he had heart catheterizations that all turned out well. He reports having diabetes. He also reports neuropathy along his whole right side that lasted for a few months that ceased abruptly.   Interval History:   Eric Harrington is a wonderful 83 y.o. gentleman who is here for continued evaluation and management of CLL.  Patient was last seen by me on 09/17/2023 and complained of fatigue, night sweats which are occasionally severe drenching, enlarged lymph node on his left neck, body weakness, mild constipation, abdominal pain, and light headiness. He also reported having a severe reaction with skin rash, to influenza vaccine, received in October.   Patient is here for follow-up of her CLL and for toxicity check with his venetoclax .  He notes that he is in the second week and is currently taking 50 mg of venetoclax  daily without any notable toxicities.  He notes that he is eating well and trying to drink a lot of water. He notes no diarrhea nausea or vomiting outside of significant GI issues.  He has been staying compliant with his medications. We discussed his labs which show significant improvement already in his WBC count.    MEDICAL HISTORY:  1. HTN 2. DM2 3. H/o PAC's 4. transient a fib with cardiac cath 5. HLD 6. CKD stage 3 7. Irritable bowel syndrome 8. GERD 9. H/o presyndope  SURGICAL HISTORY:  1. Prostate bx 2. Cardiac cath  SOCIAL HISTORY: Social History   Socioeconomic History   Marital status: Married    Spouse name: Not on file   Number of children: 2   Years of education: Not on file   Highest education level: Not on file  Occupational History   Not on file  Tobacco  Use   Smoking status: Former    Current packs/day: 0.00    Average packs/day: 1 pack/day for 30.0 years (30.0 ttl pk-yrs)    Types: Cigarettes    Start date: 56    Quit date: 21    Years since quitting: 45.3   Smokeless tobacco: Never   Tobacco comments:    started at age 48  Vaping Use   Vaping status: Never Used  Substance and Sexual Activity   Alcohol use: Never   Drug use: Not Currently   Sexual activity: Not Currently    Birth control/protection: None  Other Topics Concern   Not on file  Social  History Narrative   2 Cups of Coffee   Right Handed    Social Drivers of Health   Financial Resource Strain: Low Risk  (05/20/2023)   Overall Financial Resource Strain (CARDIA)    Difficulty of Paying Living Expenses: Not hard at all  Food Insecurity: No Food Insecurity (05/20/2023)   Hunger Vital Sign    Worried About Running Out of Food in the Last Year: Never true    Ran Out of Food in the Last Year: Never true  Transportation Needs: No Transportation Needs (05/20/2023)   PRAPARE - Administrator, Civil Service (Medical): No    Lack of Transportation (Non-Medical): No  Physical Activity: Inactive (05/20/2023)   Exercise Vital Sign    Days of Exercise per Week: 0 days    Minutes of Exercise per Session: 0 min  Stress: No Stress Concern Present (05/20/2023)   [employer] of Occupational Health - Occupational Stress Questionnaire    Feeling of Stress : Only a little  Social Connections: Moderately Integrated (05/20/2023)   Social Connection and Isolation Panel [NHANES]    Frequency of Communication with Friends and Family: More than three times a week    Frequency of Social Gatherings with Friends and Family: Twice a week    Attends Religious Services: More than 4 times per year    Active Member of [employer] or Organizations: No    Attends Banker Meetings: Never    Marital Status: Married  Catering manager Violence: Not At Risk (05/20/2023)   Humiliation, Afraid, Rape, and Kick questionnaire    Fear of Current or Ex-Partner: No    Emotionally Abused: No    Physically Abused: No    Sexually Abused: No    FAMILY HISTORY: Family History  Problem Relation Age of Onset   Lung cancer Mother    Heart attack Father    Dementia Sister    Diabetes Brother    Diabetes Sister    Heart attack Brother     ALLERGIES:  is allergic to norvasc [amlodipine besylate], augmentin [amoxicillin -pot clavulanate], bystolic [nebivolol hcl], coq10 [coenzyme q10],  cozaar [losartan], farxiga [dapagliflozin], glucotrol [glipizide], januvia [sitagliptin], keflex [cephalexin], lipitor [atorvastatin calcium ], lipitor [atorvastatin], lopid [gemfibrozil], omnicef [cefdinir], prilosec [omeprazole], toprol  xl [metoprolol ], vibra-tab [doxycycline], zestril [lisinopril], zetia [ezetimibe], zocor [simvastatin], and neurontin [gabapentin].  MEDICATIONS:  Current Outpatient Medications  Medication Sig Dispense Refill   acetaminophen  (TYLENOL ) 500 MG tablet Take 1,000 mg by mouth every 8 (eight) hours as needed for moderate pain, fever or headache.     allopurinol  (ZYLOPRIM ) 100 MG tablet Take 1 tablet (100 mg total) by mouth 2 (two) times daily. 60 tablet 1   amiodarone  (PACERONE ) 200 MG tablet Take 0.5 tablets (100 mg total) by mouth daily. 45 tablet 1   apixaban  (ELIQUIS ) 5 MG TABS tablet Take  1 tablet (5 mg total) by mouth 2 (two) times daily. 60 tablet 0   ASCORBIC ACID  PO Take 1 tablet by mouth daily. Vitamin C .     cetirizine (ZYRTEC) 10 MG tablet Take 10 mg by mouth daily as needed for allergies.     Cholecalciferol  (VITAMIN D -3 PO) Take 1 capsule by mouth daily.     Cyanocobalamin  (VITAMIN B12) 1000 MCG TBCR Take 1 tablet by mouth daily.     docusate sodium  (COLACE) 100 MG capsule Take 100 mg by mouth daily.      esomeprazole (NEXIUM 24HR) 20 MG capsule Take 20 mg by mouth daily at 12 noon.     ferrous sulfate  325 (65 FE) MG tablet Take 1 tablet (325 mg total) by mouth every other day. 15 tablet 0   fexofenadine (ALLEGRA) 180 MG tablet Take 180 mg by mouth daily.     FIBER PO Take 3 tablets by mouth daily.     fludrocortisone  (FLORINEF ) 0.1 MG tablet Take 1 tablet (100 mcg total) by mouth every evening. 90 tablet 1   fluticasone  (FLONASE ) 50 MCG/ACT nasal spray Place 1 spray into both nostrils daily as needed for allergies.     glimepiride  (AMARYL ) 2 MG tablet Take 2 mg by mouth daily with breakfast.     metFORMIN  (GLUCOPHAGE ) 500 MG tablet Take 500 mg by mouth  2 (two) times daily with a meal.     midodrine  (PROAMATINE ) 10 MG tablet Take 1 tablet (10 mg total) by mouth 3 (three) times daily with meals. While awake. Do not lay down for 3-4 hours after taking 270 tablet 3   Multiple Vitamins-Minerals (ZINC PO) Take 1 tablet by mouth daily.     Polyethyl Glycol-Propyl Glycol (SYSTANE OP) Place 1 drop into both eyes 2 (two) times daily as needed (dryness, irritation.).     Pyridoxine  HCl (VITAMIN B-6 PO) Take 1 tablet by mouth daily.     rosuvastatin  (CRESTOR ) 5 MG tablet Take 5 mg by mouth every evening.     venetoclax  (VENCLEXTA ) 10 & 50 & 100 MG Starter Pack Take by mouth daily. Take 20 mg for 7 days, then 50 mg daily x 7d, then 100 mg daily x 7d, then 200 mg daily x 7d. Take with food & water. 42 tablet 0   No current facility-administered medications for this visit.    REVIEW OF SYSTEMS:    10 Point review of Systems was done is negative except as noted above.   PHYSICAL EXAMINATION:  Vitals:   09/30/23 1127  BP: 132/63  Pulse: 73  Resp: 18  Temp: (!) 97.5 F (36.4 C)  SpO2: 100%   Filed Weights   09/30/23 1127  Weight: 162 lb 4.8 oz (73.6 kg)   GENERAL:alert, in no acute distress and comfortable SKIN: no acute rashes, no significant lesions EYES: conjunctiva are pink and non-injected, sclera anicteric OROPHARYNX: MMM, no exudates, no oropharyngeal erythema or ulceration NECK: supple, no JVD LYMPH:  no palpable lymphadenopathy in the cervical, axillary or inguinal regions LUNGS: clear to auscultation b/l with normal respiratory effort HEART: regular rate & rhythm ABDOMEN:  normoactive bowel sounds , non tender, not distended. Extremity: no pedal edema PSYCH: alert & oriented x 3 with fluent speech NEURO: no focal motor/sensory deficits   LABORATORY DATA:   I have reviewed the data as listed  .    Latest Ref Rng & Units 09/30/2023   10:51 AM 09/24/2023   11:49 AM 09/17/2023   11:47 AM  CBC  WBC 4.0 - 10.5 K/uL 64.0  97.5   131.7   Hemoglobin 13.0 - 17.0 g/dL 8.7  9.1  8.0   Hematocrit 39.0 - 52.0 % 27.5  28.8  26.2   Platelets 150 - 400 K/uL 74  78  72     . CBC    Component Value Date/Time   WBC 64.0 (HH) 09/30/2023 1051   WBC 75.8 (HH) 05/24/2023 1048   RBC 2.74 (L) 09/30/2023 1051   HGB 8.7 (L) 09/30/2023 1051   HGB 7.8 (L) 09/09/2023 1330   HCT 27.5 (L) 09/30/2023 1051   HCT 26.0 (L) 09/09/2023 1330   PLT 74 (L) 09/30/2023 1051   PLT 93 (LL) 09/09/2023 1330   MCV 100.4 (H) 09/30/2023 1051   MCV 104 (H) 09/09/2023 1330   MCH 31.8 09/30/2023 1051   MCHC 31.6 09/30/2023 1051   RDW 16.2 (H) 09/30/2023 1051   RDW 14.8 09/09/2023 1330   LYMPHSABS 59.9 (H) 09/30/2023 1051   MONOABS 3.3 (H) 09/30/2023 1051   EOSABS 0.0 09/30/2023 1051   BASOSABS 0.0 09/30/2023 1051   .[facility] A[facility] Lab Results  Component Value Date   RETICCTPCT 1.4 04/24/2018   RBC 2.88 (L) 09/24/2023       Latest Ref Rng & Units 09/24/2023   11:49 AM 09/17/2023   11:47 AM 09/09/2023    1:30 PM  CMP  Glucose 70 - 99 mg/dL 478  295  621   BUN 8 - 23 mg/dL 28  22  21    Creatinine 0.61 - 1.24 mg/dL 3.08  6.57  8.46   Sodium 135 - 145 mmol/L 134  135  134   Potassium 3.5 - 5.1 mmol/L 3.8  3.7  3.8   Chloride 98 - 111 mmol/L 100  98  97   CO2 22 - 32 mmol/L 27  29  22    Calcium  8.9 - 10.3 mg/dL 9.9  9.7  9.7   Total Protein 6.5 - 8.1 g/dL 6.7  6.4    Total Bilirubin 0.0 - 1.2 mg/dL 0.4  0.5    Alkaline Phos 38 - 126 U/L 103  102    AST 15 - 41 U/L 15  15    ALT 0 - 44 U/L 11  10     . Lab Results  Component Value Date   LDH 163 09/24/2023   Component     Latest Ref Rng & Units 06/13/2017  HCV Ab     0.0 - 0.9 s/co ratio <0.1  Hepatitis B Surface Ag     Negative Negative  Hep B Core Ab, Tot     Negative Negative         RADIOGRAPHIC STUDIES: I have personally reviewed the radiological images as listed and agreed with the findings in the report. CUP PACEART REMOTE DEVICE CHECK Result Date: 09/12/2023 ILR summary  report received. Battery status OK. Normal device function. No new tachy, brady, or pause episodes. No new AF episodes. Monthly summary reports and ROV/PRN 1 symptom activation, SR with 5sec of tachy HR ~130 bpm LA, CVRS   ASSESSMENT & PLAN:   83 y.o. is a male with  1. Chronic lymphocytic leukemia. Likely Rai Stage 0, CLL FISH panel with 13 q. deletion  -he presented with Lymphocytosis incidentally noted on routine labs No associated significant anemia or thrombocytopenia. No constitutional symptoms No overt clinically palpable LNadenopathy or hepato-splenomegaly. 13q mutation present   08/28/17 CT C/A/P revealed Normal sized spleen, no enlarged lymph  nodes. There is evidence of very small lung nodule, likely related to inflammatory process. Will monitor with CT chest in 12 months    PLAN:  -Discussed lab results from today, 09/30/2023, in detail with patient CBC shows improvement in WBC count down from one 135.9k down to 64k.  Hemoglobin of 8.7 and platelets of 74k - Uric acid stable at 4.6 and phosphorus is normal at 3 CMP stable chronic kidney disease sodium of 132 Patient notes no notable toxicities from his current dose of venetoclax  He will continue his venetoclax  escalation weekly as per starter pack. -Continue allopurinol  at this time. -Again counseled to make sure he drinks only 64 ounces of water daily. -Optimize p.o. fluid intake and nutrition. -continue to follow-up with Cardiologist for continued evaluation and management of Afib  FOLLOW-UP: Follow-up as per currently scheduled appointments  The total time spent in the appointment was 30 minutes* .  All of the patient's questions were answered with apparent satisfaction. The patient knows to call the clinic with any problems, questions or concerns.   Jacquelyn Matt MD MS AAHIVMS [facility] [place] Hematology/Oncology Physician [facility]  .*Total Encounter Time as defined by the Centers for Medicare and Medicaid  Services includes, in addition to the face-to-face time of a patient visit (documented in the note above) non-face-to-face time: obtaining and reviewing outside history, ordering and reviewing medications, tests or procedures, care coordination (communications with other health care professionals or caregivers) and documentation in the medical record.    I,Mitra Faeizi,acting as a Neurosurgeon for Jacquelyn Matt, MD.,have documented all relevant documentation on the behalf of Jacquelyn Matt, MD,as directed by  Jacquelyn Matt, MD while in the presence of Jacquelyn Matt, MD.  .I have reviewed the above documentation for accuracy and completeness, and I agree with the above. .Chance Munter Kishore Carnel Stegman MD

## 2023-09-30 ENCOUNTER — Inpatient Hospital Stay (HOSPITAL_BASED_OUTPATIENT_CLINIC_OR_DEPARTMENT_OTHER): Admitting: Hematology

## 2023-09-30 ENCOUNTER — Inpatient Hospital Stay

## 2023-09-30 ENCOUNTER — Telehealth: Payer: Self-pay

## 2023-09-30 VITALS — BP 132/63 | HR 73 | Temp 97.5°F | Resp 18 | Wt 162.3 lb

## 2023-09-30 DIAGNOSIS — C9110 Chronic lymphocytic leukemia of B-cell type not having achieved remission: Secondary | ICD-10-CM | POA: Diagnosis not present

## 2023-09-30 DIAGNOSIS — D649 Anemia, unspecified: Secondary | ICD-10-CM

## 2023-09-30 LAB — CMP (CANCER CENTER ONLY)
ALT: 12 U/L (ref 0–44)
AST: 14 U/L — ABNORMAL LOW (ref 15–41)
Albumin: 4.6 g/dL (ref 3.5–5.0)
Alkaline Phosphatase: 103 U/L (ref 38–126)
Anion gap: 7 (ref 5–15)
BUN: 21 mg/dL (ref 8–23)
CO2: 27 mmol/L (ref 22–32)
Calcium: 9.7 mg/dL (ref 8.9–10.3)
Chloride: 98 mmol/L (ref 98–111)
Creatinine: 1.45 mg/dL — ABNORMAL HIGH (ref 0.61–1.24)
GFR, Estimated: 49 mL/min — ABNORMAL LOW (ref >60)
Glucose, Bld: 171 mg/dL — ABNORMAL HIGH (ref 70–99)
Potassium: 3.9 mmol/L (ref 3.5–5.1)
Sodium: 132 mmol/L — ABNORMAL LOW (ref 135–145)
Total Bilirubin: 0.4 mg/dL (ref 0.0–1.2)
Total Protein: 6.8 g/dL (ref 6.5–8.1)

## 2023-09-30 LAB — CBC WITH DIFFERENTIAL (CANCER CENTER ONLY)
Abs Immature Granulocytes: 0.02 10*3/uL (ref 0.00–0.07)
Basophils Absolute: 0 10*3/uL (ref 0.0–0.1)
Basophils Relative: 0 %
Eosinophils Absolute: 0 10*3/uL (ref 0.0–0.5)
Eosinophils Relative: 0 %
HCT: 27.5 % — ABNORMAL LOW (ref 39.0–52.0)
Hemoglobin: 8.7 g/dL — ABNORMAL LOW (ref 13.0–17.0)
Immature Granulocytes: 0 %
Lymphocytes Relative: 94 %
Lymphs Abs: 59.9 10*3/uL — ABNORMAL HIGH (ref 0.7–4.0)
MCH: 31.8 pg (ref 26.0–34.0)
MCHC: 31.6 g/dL (ref 30.0–36.0)
MCV: 100.4 fL — ABNORMAL HIGH (ref 80.0–100.0)
Monocytes Absolute: 3.3 10*3/uL — ABNORMAL HIGH (ref 0.1–1.0)
Monocytes Relative: 5 %
Neutro Abs: 0.7 10*3/uL — ABNORMAL LOW (ref 1.7–7.7)
Neutrophils Relative %: 1 %
Platelet Count: 74 10*3/uL — ABNORMAL LOW (ref 150–400)
RBC: 2.74 MIL/uL — ABNORMAL LOW (ref 4.22–5.81)
RDW: 16.2 % — ABNORMAL HIGH (ref 11.5–15.5)
Smear Review: NORMAL
WBC Count: 64 10*3/uL (ref 4.0–10.5)
nRBC: 0 % (ref 0.0–0.2)

## 2023-09-30 LAB — PHOSPHORUS: Phosphorus: 3 mg/dL (ref 2.5–4.6)

## 2023-09-30 LAB — URIC ACID: Uric Acid, Serum: 4.6 mg/dL (ref 3.7–8.6)

## 2023-09-30 LAB — SAMPLE TO BLOOD BANK

## 2023-09-30 LAB — LACTATE DEHYDROGENASE: LDH: 121 U/L (ref 98–192)

## 2023-09-30 MED ORDER — ONDANSETRON HCL 4 MG PO TABS: 4.0000 mg | ORAL_TABLET | Freq: Three times a day (TID) | ORAL | 0 refills | Status: AC | PRN

## 2023-09-30 NOTE — Telephone Encounter (Signed)
 CRITICAL VALUE STICKER  CRITICAL VALUE:  WBC 64  RECEIVER (on-site recipient of call): Hellen Lob  DATE & TIME NOTIFIED: 09/30/23  11:36  MESSENGER (representative from lab): Amber  MD NOTIFIED: Marlea Silvers, MD  TIME OF NOTIFICATION:11:36  RESPONSE:

## 2023-09-30 NOTE — Progress Notes (Signed)
 Dr Salomon Cree informed of WBC 64, no new orders.

## 2023-10-01 NOTE — Progress Notes (Signed)
 [employer]

## 2023-10-07 ENCOUNTER — Other Ambulatory Visit: Payer: Self-pay

## 2023-10-07 DIAGNOSIS — C9110 Chronic lymphocytic leukemia of B-cell type not having achieved remission: Secondary | ICD-10-CM

## 2023-10-08 ENCOUNTER — Other Ambulatory Visit: Payer: Self-pay

## 2023-10-08 ENCOUNTER — Inpatient Hospital Stay

## 2023-10-08 DIAGNOSIS — C9110 Chronic lymphocytic leukemia of B-cell type not having achieved remission: Secondary | ICD-10-CM

## 2023-10-08 LAB — CBC WITH DIFFERENTIAL (CANCER CENTER ONLY)
Abs Immature Granulocytes: 0 10*3/uL (ref 0.00–0.07)
Basophils Absolute: 0 10*3/uL (ref 0.0–0.1)
Basophils Relative: 0 %
Eosinophils Absolute: 0 10*3/uL (ref 0.0–0.5)
Eosinophils Relative: 0 %
HCT: 23.2 % — ABNORMAL LOW (ref 39.0–52.0)
Hemoglobin: 7.9 g/dL — ABNORMAL LOW (ref 13.0–17.0)
Immature Granulocytes: 0 %
Lymphocytes Relative: 84 %
Lymphs Abs: 5.8 10*3/uL — ABNORMAL HIGH (ref 0.7–4.0)
MCH: 33.6 pg (ref 26.0–34.0)
MCHC: 34.1 g/dL (ref 30.0–36.0)
MCV: 98.7 fL (ref 80.0–100.0)
Monocytes Absolute: 0.9 10*3/uL (ref 0.1–1.0)
Monocytes Relative: 13 %
Neutro Abs: 0.2 10*3/uL — CL (ref 1.7–7.7)
Neutrophils Relative %: 3 %
Platelet Count: 56 10*3/uL — ABNORMAL LOW (ref 150–400)
RBC: 2.35 MIL/uL — ABNORMAL LOW (ref 4.22–5.81)
RDW: 16.3 % — ABNORMAL HIGH (ref 11.5–15.5)
Smear Review: NORMAL
WBC Count: 7.2 10*3/uL (ref 4.0–10.5)
nRBC: 0 % (ref 0.0–0.2)

## 2023-10-08 LAB — CMP (CANCER CENTER ONLY)
ALT: 11 U/L (ref 0–44)
AST: 13 U/L — ABNORMAL LOW (ref 15–41)
Albumin: 4.2 g/dL (ref 3.5–5.0)
Alkaline Phosphatase: 87 U/L (ref 38–126)
Anion gap: 6 (ref 5–15)
BUN: 19 mg/dL (ref 8–23)
CO2: 27 mmol/L (ref 22–32)
Calcium: 9.1 mg/dL (ref 8.9–10.3)
Chloride: 101 mmol/L (ref 98–111)
Creatinine: 1.45 mg/dL — ABNORMAL HIGH (ref 0.61–1.24)
GFR, Estimated: 49 mL/min — ABNORMAL LOW (ref >60)
Glucose, Bld: 126 mg/dL — ABNORMAL HIGH (ref 70–99)
Potassium: 3.6 mmol/L (ref 3.5–5.1)
Sodium: 134 mmol/L — ABNORMAL LOW (ref 135–145)
Total Bilirubin: 0.5 mg/dL (ref 0.0–1.2)
Total Protein: 6.1 g/dL — ABNORMAL LOW (ref 6.5–8.1)

## 2023-10-08 LAB — PHOSPHORUS: Phosphorus: 3.3 mg/dL (ref 2.5–4.6)

## 2023-10-08 LAB — LACTATE DEHYDROGENASE: LDH: 110 U/L (ref 98–192)

## 2023-10-08 LAB — URIC ACID: Uric Acid, Serum: 6.1 mg/dL (ref 3.7–8.6)

## 2023-10-08 NOTE — Progress Notes (Signed)
 Labs reviewed with Dr Salomon Cree from today: ANC:0.2, Hgb: 7.9, Plts 56. Pt to observe neutropenic precautions and call if he starts feeling symptomatic. Pt contacted and given above information. Pt and wife acknowledged information and verbalized understanding.

## 2023-10-15 ENCOUNTER — Other Ambulatory Visit (HOSPITAL_COMMUNITY): Payer: Self-pay

## 2023-10-15 ENCOUNTER — Telehealth: Payer: Self-pay | Admitting: *Deleted

## 2023-10-15 ENCOUNTER — Other Ambulatory Visit: Payer: Self-pay

## 2023-10-15 ENCOUNTER — Inpatient Hospital Stay

## 2023-10-15 ENCOUNTER — Ambulatory Visit: Payer: Medicare Other

## 2023-10-15 ENCOUNTER — Inpatient Hospital Stay (HOSPITAL_BASED_OUTPATIENT_CLINIC_OR_DEPARTMENT_OTHER): Admitting: Hematology

## 2023-10-15 VITALS — BP 139/52 | HR 69 | Temp 97.5°F | Resp 18 | Wt 164.0 lb

## 2023-10-15 DIAGNOSIS — D649 Anemia, unspecified: Secondary | ICD-10-CM | POA: Diagnosis not present

## 2023-10-15 DIAGNOSIS — D696 Thrombocytopenia, unspecified: Secondary | ICD-10-CM

## 2023-10-15 DIAGNOSIS — C9110 Chronic lymphocytic leukemia of B-cell type not having achieved remission: Secondary | ICD-10-CM | POA: Diagnosis not present

## 2023-10-15 DIAGNOSIS — I480 Paroxysmal atrial fibrillation: Secondary | ICD-10-CM

## 2023-10-15 LAB — CMP (CANCER CENTER ONLY)
ALT: 9 U/L (ref 0–44)
AST: 13 U/L — ABNORMAL LOW (ref 15–41)
Albumin: 4.3 g/dL (ref 3.5–5.0)
Alkaline Phosphatase: 80 U/L (ref 38–126)
Anion gap: 8 (ref 5–15)
BUN: 19 mg/dL (ref 8–23)
CO2: 27 mmol/L (ref 22–32)
Calcium: 9.2 mg/dL (ref 8.9–10.3)
Chloride: 100 mmol/L (ref 98–111)
Creatinine: 1.32 mg/dL — ABNORMAL HIGH (ref 0.61–1.24)
GFR, Estimated: 55 mL/min — ABNORMAL LOW (ref >60)
Glucose, Bld: 140 mg/dL — ABNORMAL HIGH (ref 70–99)
Potassium: 3.6 mmol/L (ref 3.5–5.1)
Sodium: 135 mmol/L (ref 135–145)
Total Bilirubin: 0.5 mg/dL (ref 0.0–1.2)
Total Protein: 6.3 g/dL — ABNORMAL LOW (ref 6.5–8.1)

## 2023-10-15 LAB — CBC WITH DIFFERENTIAL (CANCER CENTER ONLY)
Abs Immature Granulocytes: 0 10*3/uL (ref 0.00–0.07)
Band Neutrophils: 1 %
Basophils Absolute: 0 10*3/uL (ref 0.0–0.1)
Basophils Relative: 0 %
Eosinophils Absolute: 0 10*3/uL (ref 0.0–0.5)
Eosinophils Relative: 0 %
HCT: 22.9 % — ABNORMAL LOW (ref 39.0–52.0)
Hemoglobin: 7.6 g/dL — ABNORMAL LOW (ref 13.0–17.0)
Lymphocytes Relative: 85 %
Lymphs Abs: 2 10*3/uL (ref 0.7–4.0)
MCH: 33 pg (ref 26.0–34.0)
MCHC: 33.2 g/dL (ref 30.0–36.0)
MCV: 99.6 fL (ref 80.0–100.0)
Monocytes Absolute: 0 10*3/uL — ABNORMAL LOW (ref 0.1–1.0)
Monocytes Relative: 1 %
Neutro Abs: 0.3 10*3/uL — CL (ref 1.7–7.7)
Neutrophils Relative %: 13 %
Platelet Count: 54 10*3/uL — ABNORMAL LOW (ref 150–400)
RBC: 2.3 MIL/uL — ABNORMAL LOW (ref 4.22–5.81)
RDW: 16.8 % — ABNORMAL HIGH (ref 11.5–15.5)
WBC Count: 2.3 10*3/uL — ABNORMAL LOW (ref 4.0–10.5)
nRBC: 0 % (ref 0.0–0.2)

## 2023-10-15 LAB — URIC ACID: Uric Acid, Serum: 5.3 mg/dL (ref 3.7–8.6)

## 2023-10-15 LAB — PREPARE RBC (CROSSMATCH)

## 2023-10-15 LAB — SAMPLE TO BLOOD BANK

## 2023-10-15 LAB — LACTATE DEHYDROGENASE: LDH: 124 U/L (ref 98–192)

## 2023-10-15 MED ORDER — VENETOCLAX 100 MG PO TABS
100.0000 mg | ORAL_TABLET | Freq: Every day | ORAL | 3 refills | Status: DC
  Filled 2023-10-15: qty 120, 120d supply, fill #0
  Filled 2023-10-15: qty 28, 28d supply, fill #0
  Filled 2023-11-13 – 2023-11-27 (×2): qty 28, 28d supply, fill #1
  Filled 2023-12-19: qty 28, 28d supply, fill #2
  Filled 2024-01-16: qty 28, 28d supply, fill #3

## 2023-10-15 NOTE — Progress Notes (Signed)
 Specialty Pharmacy Ongoing Clinical Assessment Note  Eric Harrington is a 83 y.o. male who is being followed by the specialty pharmacy service for RxSp Oncology   Patient's specialty medication(s) reviewed today: Venetoclax  (VENCLEXTA )   Missed doses in the last 4 weeks: 0   Patient/Caregiver did not have any additional questions or concerns.   Therapeutic benefit summary: Patient is achieving benefit   Adverse events/side effects summary: No adverse events/side effects   Patient's therapy is appropriate to: Continue    Goals Addressed             This Visit's Progress    Maintain optimal adherence to therapy       Patient is on track. Patient will maintain adherence.  Per Dr. Epifania Haskell notes from 09/30/23, the patient has already seen significant results in his WBC count.         Follow up: 3 months  Malachi Screws Specialty Pharmacist

## 2023-10-15 NOTE — Progress Notes (Signed)
 Specialty Pharmacy Refill Coordination Note  Eric Harrington is a 83 y.o. male contacted today regarding refills of specialty medication(s) Venetoclax  (VENCLEXTA )   Patient requested Delivery   Delivery date: 10/18/23   Verified address: [address], [place], [place] [address]   Medication will be filled on 10/17/23.

## 2023-10-15 NOTE — Telephone Encounter (Signed)
 CRITICAL VALUE STICKER  CRITICAL VALUE: ANC 0.3  RECEIVER (on-site recipient of call):Sandi K, RN  DATE & TIME NOTIFIED: 10/15/23; 1117  MESSENGER (representative from lab):Trenia Fritter  MD NOTIFIED: Dr. Rolan Clerk  TIME OF NOTIFICATION:1122  RESPONSE: Information acknowledged

## 2023-10-15 NOTE — Progress Notes (Signed)
 HEMATOLOGY ONCOLOGY CLINIC NOTE  Date of Service: 10/15/23   Patient Care Team: Dorena Gander, MD as PCP - General (Family Medicine) Knox Perl, MD as PCP - Cardiology (Cardiology) Boyce Byes, MD as PCP - Electrophysiology (Cardiology) Frankie Israel, MD as Consulting Physician (Hematology)  CHIEF COMPLAINTS/PURPOSE OF CONSULTATION:  Continued evaluation and management of CLL  HISTORY OF PRESENTING ILLNESS:   Eric Harrington 83 y.o. male is here because of a referral from Dr. Randle Butler from [facility] regarding a trend in his elevated WBC.   He is accompanied today by his wife of 54 years. The pt reports that he is doing well overall. He recently had a biopsy to check his prostate at [facility] with Dr. Ozella Blush and they took 12 core samples with reportedly no significant findings.  Of note prior to the patient's visit today, pt has had CBC completed on 04/26/17 with results revealing WBC at 14.7, Hgb at 12.6, Lymph Abs at 11.0 with all other values WNL. On 03/26/17 his WBC were 12.8, Hgb at 13.3 and Lymph's at  9.90. On 02/06/17 his WBC were 12.7, Hgb at 13.4, and Lymph's at 8.80.   On review of systems, pt reports post nasal drip, persistent cough, occasional troublesome stomach, pain in his lower abdomen that feels like his muscles are irritated, reports he has lost 25 lbs in the last 2.5 years (he associates a decreased appetite after taking beta blockers during this period) and denies no recent colds or infections, acute changes in energy levels, and leg swelling.   On PMHx the pt reports taking Zetia. He has IB'S and has had 3 colonoscopies with no significant findings or inflammatory processes. He reports that over the last 2.5 years he had heart catheterizations that all turned out well. He reports having diabetes. He also reports neuropathy along his whole right side that lasted for a few months that ceased abruptly.   Interval History:   Eric Harrington is a wonderful 83 y.o. gentleman who is here for continued evaluation and management of CLL. Patient was last seen by me on 09/30/2023 and reported no new symptoms outside of significant GI issues.   Patient is accompanied by his wife during today's visit. He reports that he started taking 200 MG Venetoclax  yesterday. He denies any new or major toxicity issues with venetoclax .   He complains of weakness and feeling very tired.   Patient reports one episode of epistaxis, which lasted 4 hours though it was not significantly severe. He does use Flonase . He denies any blood in the stools.  Patient noted to have two small cavities, and his wife notes that there are plans for filling. We discussed that I would not recommend this dental work until his neutrophils improve. He denies any dental pain.   Patient complains of stomach issues since age 57s which have not worsened with venetoclax .   He continues to take allopurinol .   Patient drinks at least 56 ounces of water daily.   He reports several lumps/bumps which fluctuate in size.  MEDICAL HISTORY:  1. HTN 2. DM2 3. H/o PAC's 4. transient a fib with cardiac cath 5. HLD 6. CKD stage 3 7. Irritable bowel syndrome 8. GERD 9. H/o presyndope  SURGICAL HISTORY:  1. Prostate bx 2. Cardiac cath  SOCIAL HISTORY: Social History   Socioeconomic History   Marital status: Married    Spouse name: Not on file   Number of children: 2  Years of education: Not on file   Highest education level: Not on file  Occupational History   Not on file  Tobacco Use   Smoking status: Former    Current packs/day: 0.00    Average packs/day: 1 pack/day for 30.0 years (30.0 ttl pk-yrs)    Types: Cigarettes    Start date: 72    Quit date: 69    Years since quitting: 45.4   Smokeless tobacco: Never   Tobacco comments:    started at age 54  Vaping Use   Vaping status: Never Used  Substance and Sexual Activity   Alcohol use: Never   Drug  use: Not Currently   Sexual activity: Not Currently    Birth control/protection: None  Other Topics Concern   Not on file  Social History Narrative   2 Cups of Coffee   Right Handed    Social Drivers of Health   Financial Resource Strain: Low Risk  (05/20/2023)   Overall Financial Resource Strain (CARDIA)    Difficulty of Paying Living Expenses: Not hard at all  Food Insecurity: No Food Insecurity (05/20/2023)   Hunger Vital Sign    Worried About Running Out of Food in the Last Year: Never true    Ran Out of Food in the Last Year: Never true  Transportation Needs: No Transportation Needs (05/20/2023)   PRAPARE - Administrator, Civil Service (Medical): No    Lack of Transportation (Non-Medical): No  Physical Activity: Inactive (05/20/2023)   Exercise Vital Sign    Days of Exercise per Week: 0 days    Minutes of Exercise per Session: 0 min  Stress: No Stress Concern Present (05/20/2023)   [employer] of Occupational Health - Occupational Stress Questionnaire    Feeling of Stress : Only a little  Social Connections: Moderately Integrated (05/20/2023)   Social Connection and Isolation Panel [NHANES]    Frequency of Communication with Friends and Family: More than three times a week    Frequency of Social Gatherings with Friends and Family: Twice a week    Attends Religious Services: More than 4 times per year    Active Member of [employer] or Organizations: No    Attends Banker Meetings: Never    Marital Status: Married  Catering manager Violence: Not At Risk (05/20/2023)   Humiliation, Afraid, Rape, and Kick questionnaire    Fear of Current or Ex-Partner: No    Emotionally Abused: No    Physically Abused: No    Sexually Abused: No    FAMILY HISTORY: Family History  Problem Relation Age of Onset   Lung cancer Mother    Heart attack Father    Dementia Sister    Diabetes Brother    Diabetes Sister    Heart attack Brother     ALLERGIES:  is  allergic to norvasc [amlodipine besylate], augmentin [amoxicillin -pot clavulanate], bystolic [nebivolol hcl], coq10 [coenzyme q10], cozaar [losartan], farxiga [dapagliflozin], glucotrol [glipizide], januvia [sitagliptin], keflex [cephalexin], lipitor [atorvastatin calcium ], lipitor [atorvastatin], lopid [gemfibrozil], omnicef [cefdinir], prilosec [omeprazole], toprol  xl [metoprolol ], vibra-tab [doxycycline], zestril [lisinopril], zetia [ezetimibe], zocor [simvastatin], and neurontin [gabapentin].  MEDICATIONS:  Current Outpatient Medications  Medication Sig Dispense Refill   acetaminophen  (TYLENOL ) 500 MG tablet Take 1,000 mg by mouth every 8 (eight) hours as needed for moderate pain, fever or headache.     allopurinol  (ZYLOPRIM ) 100 MG tablet Take 1 tablet (100 mg total) by mouth 2 (two) times daily. 60 tablet 1   amiodarone  (PACERONE )  200 MG tablet Take 0.5 tablets (100 mg total) by mouth daily. 45 tablet 1   apixaban  (ELIQUIS ) 5 MG TABS tablet Take 1 tablet (5 mg total) by mouth 2 (two) times daily. 60 tablet 0   ASCORBIC ACID  PO Take 1 tablet by mouth daily. Vitamin C .     cetirizine (ZYRTEC) 10 MG tablet Take 10 mg by mouth daily as needed for allergies.     Cholecalciferol  (VITAMIN D -3 PO) Take 1 capsule by mouth daily.     Cyanocobalamin  (VITAMIN B12) 1000 MCG TBCR Take 1 tablet by mouth daily.     docusate sodium  (COLACE) 100 MG capsule Take 100 mg by mouth daily.      esomeprazole (NEXIUM 24HR) 20 MG capsule Take 20 mg by mouth daily at 12 noon.     ferrous sulfate  325 (65 FE) MG tablet Take 1 tablet (325 mg total) by mouth every other day. 15 tablet 0   fexofenadine (ALLEGRA) 180 MG tablet Take 180 mg by mouth daily.     FIBER PO Take 3 tablets by mouth daily.     fludrocortisone  (FLORINEF ) 0.1 MG tablet Take 1 tablet (100 mcg total) by mouth every evening. 90 tablet 1   fluticasone  (FLONASE ) 50 MCG/ACT nasal spray Place 1 spray into Harrington nostrils daily as needed for allergies.      glimepiride  (AMARYL ) 2 MG tablet Take 2 mg by mouth daily with breakfast.     metFORMIN  (GLUCOPHAGE ) 500 MG tablet Take 500 mg by mouth 2 (two) times daily with a meal.     midodrine  (PROAMATINE ) 10 MG tablet Take 1 tablet (10 mg total) by mouth 3 (three) times daily with meals. While awake. Do not lay down for 3-4 hours after taking 270 tablet 3   Multiple Vitamins-Minerals (ZINC PO) Take 1 tablet by mouth daily.     ondansetron  (ZOFRAN ) 4 MG tablet Take 1 tablet (4 mg total) by mouth every 8 (eight) hours as needed for nausea or vomiting. 20 tablet 0   Polyethyl Glycol-Propyl Glycol (SYSTANE OP) Place 1 drop into Harrington eyes 2 (two) times daily as needed (dryness, irritation.).     Pyridoxine  HCl (VITAMIN B-6 PO) Take 1 tablet by mouth daily.     rosuvastatin  (CRESTOR ) 5 MG tablet Take 5 mg by mouth every evening.     venetoclax  (VENCLEXTA ) 10 & 50 & 100 MG Starter Pack Take by mouth daily. Take 20 mg for 7 days, then 50 mg daily x 7d, then 100 mg daily x 7d, then 200 mg daily x 7d. Take with food & water. 42 tablet 0   No current facility-administered medications for this visit.    REVIEW OF SYSTEMS:    10 Point review of Systems was done is negative except as noted above.   PHYSICAL EXAMINATION:  Vitals:   10/15/23 1052  BP: (!) 139/52  Pulse: 69  Resp: 18  Temp: (!) 97.5 F (36.4 C)  SpO2: 99%    Filed Weights   10/15/23 1052  Weight: 164 lb (74.4 kg)  GENERAL:alert, in no acute distress and comfortable SKIN: no acute rashes, no significant lesions EYES: conjunctiva are pink and non-injected, sclera anicteric OROPHARYNX: MMM, no exudates, no oropharyngeal erythema or ulceration NECK: supple, no JVD LYMPH:  no palpable lymphadenopathy in the cervical, axillary or inguinal regions LUNGS: clear to auscultation b/l with normal respiratory effort HEART: regular rate & rhythm ABDOMEN:  normoactive bowel sounds , non tender, not distended. Extremity: no pedal edema PSYCH:  alert  & oriented x 3 with fluent speech NEURO: no focal motor/sensory deficits   LABORATORY DATA:   I have reviewed the data as listed  .    Latest Ref Rng & Units 10/15/2023   10:25 AM 10/08/2023   11:50 AM 09/30/2023   10:51 AM  CBC  WBC 4.0 - 10.5 K/uL 2.3  7.2  64.0   Hemoglobin 13.0 - 17.0 g/dL 7.6  7.9  8.7   Hematocrit 39.0 - 52.0 % 22.9  23.2  27.5   Platelets 150 - 400 K/uL 54  56  74     . CBC    Component Value Date/Time   WBC 2.3 (L) 10/15/2023 1025   WBC 75.8 (HH) 05/24/2023 1048   RBC 2.30 (L) 10/15/2023 1025   HGB 7.6 (L) 10/15/2023 1025   HGB 7.8 (L) 09/09/2023 1330   HCT 22.9 (L) 10/15/2023 1025   HCT 26.0 (L) 09/09/2023 1330   PLT 54 (L) 10/15/2023 1025   PLT 93 (LL) 09/09/2023 1330   MCV 99.6 10/15/2023 1025   MCV 104 (H) 09/09/2023 1330   MCH 33.0 10/15/2023 1025   MCHC 33.2 10/15/2023 1025   RDW 16.8 (H) 10/15/2023 1025   RDW 14.8 09/09/2023 1330   LYMPHSABS 2.0 10/15/2023 1025   MONOABS 0.0 (L) 10/15/2023 1025   EOSABS 0.0 10/15/2023 1025   BASOSABS 0.0 10/15/2023 1025   .Aaron Aas Lab Results  Component Value Date   RETICCTPCT 1.4 04/24/2018   RBC 2.35 (L) 10/08/2023       Latest Ref Rng & Units 10/15/2023   10:25 AM 10/08/2023   11:50 AM 09/30/2023   10:51 AM  CMP  Glucose 70 - 99 mg/dL 161  096  045   BUN 8 - 23 mg/dL 19  19  21    Creatinine 0.61 - 1.24 mg/dL 4.09  8.11  9.14   Sodium 135 - 145 mmol/L 135  134  132   Potassium 3.5 - 5.1 mmol/L 3.6  3.6  3.9   Chloride 98 - 111 mmol/L 100  101  98   CO2 22 - 32 mmol/L 27  27  27    Calcium  8.9 - 10.3 mg/dL 9.2  9.1  9.7   Total Protein 6.5 - 8.1 g/dL 6.3  6.1  6.8   Total Bilirubin 0.0 - 1.2 mg/dL 0.5  0.5  0.4   Alkaline Phos 38 - 126 U/L 80  87  103   AST 15 - 41 U/L 13  13  14    ALT 0 - 44 U/L 9  11  12     . Lab Results  Component Value Date   LDH 124 10/15/2023   Component     Latest Ref Rng & Units 06/13/2017  HCV Ab     0.0 - 0.9 s/co ratio <0.1  Hepatitis B Surface Ag      Negative Negative  Hep B Core Ab, Tot     Negative Negative         RADIOGRAPHIC STUDIES: I have personally reviewed the radiological images as listed and agreed with the findings in the report. No results found.   ASSESSMENT & PLAN:   83 y.o. is a male with  1. Chronic lymphocytic leukemia. Likely Rai Stage 0, CLL FISH panel with 13 q. deletion  -he presented with Lymphocytosis incidentally noted on routine labs No associated significant anemia or thrombocytopenia. No constitutional symptoms No overt clinically palpable LNadenopathy or hepato-splenomegaly. 13q mutation present  08/28/17 CT C/A/P revealed Normal sized spleen, no enlarged lymph nodes. There is evidence of very small lung nodule, likely related to inflammatory process. Will monitor with CT chest in 12 months    PLAN:  -Discussed lab results on 10/15/23 in detail with patient. CBC showed WBC of 2.3K, hemoglobin of 7.6, and platelets of 54K. -lymphocytes have normalized very quickly with venetoclax  -patient continues to be anemic with hgb 7.6 primarily from CLL and to some degree suppression from venetoclax  as well -discussed that some of the drop in blood counts could be from allopurinol  -We will stop allopurinol  given WBC normalized and uric acid levels are stable -with hgb 7.6 today, discussed reasonable option of blood transfusion with one unit of blood especially with hx of Afib and his fatigue to bring hgb up to at least 8 -patient is agreeable with 1 unit of PRBC transfusion -I did feel a few small lymph nodes during physical examination which appear to be smaller in size and not concerning -discussed that Eliquis  increases the risk of bleeding, especially when platelets are low -patient currently does not meet criteria for Nplate. However, we discussed that if platelets drop further below 50K, and if he needs to be on blood thinners for Afib, there may be a role for Nplate to stimulate platelets down the  line if needed -educated patient and his wife on the types and functions of different WBCs, including neutrophils -potassium normal at 3.6 -creatine improved from 1.45 to 1.32 in 1 week suggesting improved hydration. -will hold 200 MG Venetoclax  for 2 weeks to allow the bone marrow to recover then will restart Venetoclax  at one 100 MG tablet daily -since his WBCs are on the lower side, especially with neutrophilia and anemia, we will not dose-escalate his medication.  -continue infection precautions -discussed that epistaxis can also be from allergies. Recommend using saline spray to keep the nose moist. Discussed option of OTC Afrin nasal decongestant to help nose bleeds, though he would not exceed it past 2-3 doses.  -hold off on dental work for his cavities at this time until his neutrophils improve due to increased risk of abscess -discussed option to use medicated mouth wash such as Peridex  -answered all of patient's and his wife's questions in detail -will continue to monitor with labs -continue to follow-up with Cardiologist for continued evaluation and management of Afib  FOLLOW-UP: 1 unit of PRBC transfusion as soon as possible Labs and 1 unit of PRBC in 7 to 8 days Return to clinic with Dr. Salomon Cree with labs in 14 days  The total time spent in the appointment was 30 minutes* .  All of the patient's questions were answered with apparent satisfaction. The patient knows to call the clinic with any problems, questions or concerns.   Jacquelyn Matt MD MS AAHIVMS [facility] Hematology/Oncology Physician [facility]  .*Total Encounter Time as defined by the Centers for Medicare and Medicaid Services includes, in addition to the face-to-face time of a patient visit (documented in the note above) non-face-to-face time: obtaining and reviewing outside history, ordering and reviewing medications, tests or procedures, care coordination (communications with other health care  professionals or caregivers) and documentation in the medical record.    I,Mitra Faeizi,acting as a Neurosurgeon for Jacquelyn Matt, MD.,have documented all relevant documentation on the behalf of Jacquelyn Matt, MD,as directed by  Jacquelyn Matt, MD while in the presence of Jacquelyn Matt, MD.  .I have reviewed the above documentation for accuracy and completeness, and  I agree with the above. .Jaspreet Bodner Kishore Adelie Croswell MD

## 2023-10-16 ENCOUNTER — Inpatient Hospital Stay

## 2023-10-16 DIAGNOSIS — C9110 Chronic lymphocytic leukemia of B-cell type not having achieved remission: Secondary | ICD-10-CM

## 2023-10-16 LAB — CUP PACEART REMOTE DEVICE CHECK
Date Time Interrogation Session: 20250527050440
Implantable Pulse Generator Implant Date: 20240716
Pulse Gen Model: 5000
Pulse Gen Serial Number: 511029620

## 2023-10-16 MED ORDER — SODIUM CHLORIDE 0.9% IV SOLUTION
250.0000 mL | INTRAVENOUS | Status: DC
  Administered 2023-10-16: 100 mL via INTRAVENOUS

## 2023-10-16 MED ORDER — ACETAMINOPHEN 325 MG PO TABS
650.0000 mg | ORAL_TABLET | Freq: Once | ORAL | Status: AC
  Administered 2023-10-16: 650 mg via ORAL
  Filled 2023-10-16: qty 2

## 2023-10-16 MED ORDER — METHYLPREDNISOLONE SODIUM SUCC 40 MG IJ SOLR
40.0000 mg | Freq: Once | INTRAMUSCULAR | Status: AC
  Administered 2023-10-16: 40 mg via INTRAVENOUS
  Filled 2023-10-16: qty 1

## 2023-10-16 NOTE — Patient Instructions (Signed)

## 2023-10-17 ENCOUNTER — Other Ambulatory Visit: Payer: Self-pay

## 2023-10-17 LAB — TYPE AND SCREEN
ABO/RH(D): O POS
Antibody Screen: NEGATIVE
Unit division: 0

## 2023-10-17 LAB — BPAM RBC
Blood Product Expiration Date: 202506292359
ISSUE DATE / TIME: 202505281150
Unit Type and Rh: 202506292359
Unit Type and Rh: 5100

## 2023-10-20 ENCOUNTER — Ambulatory Visit: Payer: Self-pay | Admitting: Cardiology

## 2023-10-21 ENCOUNTER — Other Ambulatory Visit: Payer: Self-pay

## 2023-10-21 DIAGNOSIS — C9110 Chronic lymphocytic leukemia of B-cell type not having achieved remission: Secondary | ICD-10-CM

## 2023-10-21 DIAGNOSIS — D696 Thrombocytopenia, unspecified: Secondary | ICD-10-CM

## 2023-10-21 DIAGNOSIS — D649 Anemia, unspecified: Secondary | ICD-10-CM

## 2023-10-22 ENCOUNTER — Inpatient Hospital Stay: Attending: Hematology

## 2023-10-22 DIAGNOSIS — Z79899 Other long term (current) drug therapy: Secondary | ICD-10-CM | POA: Insufficient documentation

## 2023-10-22 DIAGNOSIS — C9110 Chronic lymphocytic leukemia of B-cell type not having achieved remission: Secondary | ICD-10-CM | POA: Diagnosis present

## 2023-10-22 DIAGNOSIS — D696 Thrombocytopenia, unspecified: Secondary | ICD-10-CM

## 2023-10-22 DIAGNOSIS — D709 Neutropenia, unspecified: Secondary | ICD-10-CM | POA: Diagnosis not present

## 2023-10-22 DIAGNOSIS — D649 Anemia, unspecified: Secondary | ICD-10-CM

## 2023-10-22 LAB — CBC WITH DIFFERENTIAL (CANCER CENTER ONLY)
Abs Immature Granulocytes: 0 10*3/uL (ref 0.00–0.07)
Band Neutrophils: 2 %
Basophils Absolute: 0 10*3/uL (ref 0.0–0.1)
Basophils Relative: 0 %
Eosinophils Absolute: 0 10*3/uL (ref 0.0–0.5)
Eosinophils Relative: 0 %
HCT: 27.2 % — ABNORMAL LOW (ref 39.0–52.0)
Hemoglobin: 9.3 g/dL — ABNORMAL LOW (ref 13.0–17.0)
Lymphocytes Relative: 89 %
Lymphs Abs: 2.8 10*3/uL (ref 0.7–4.0)
MCH: 33.6 pg (ref 26.0–34.0)
MCHC: 34.2 g/dL (ref 30.0–36.0)
MCV: 98.2 fL (ref 80.0–100.0)
Monocytes Absolute: 0 10*3/uL — ABNORMAL LOW (ref 0.1–1.0)
Monocytes Relative: 0 %
Myelocytes: 1 %
Neutro Abs: 0.3 10*3/uL — CL (ref 1.7–7.7)
Neutrophils Relative %: 8 %
Platelet Count: 61 10*3/uL — ABNORMAL LOW (ref 150–400)
RBC: 2.77 MIL/uL — ABNORMAL LOW (ref 4.22–5.81)
RDW: 17 % — ABNORMAL HIGH (ref 11.5–15.5)
WBC Count: 3.2 10*3/uL — ABNORMAL LOW (ref 4.0–10.5)
nRBC: 0 % (ref 0.0–0.2)

## 2023-10-22 LAB — URIC ACID: Uric Acid, Serum: 4.6 mg/dL (ref 3.7–8.6)

## 2023-10-22 LAB — SAMPLE TO BLOOD BANK

## 2023-10-22 LAB — PHOSPHORUS: Phosphorus: 3.3 mg/dL (ref 2.5–4.6)

## 2023-10-22 LAB — CMP (CANCER CENTER ONLY)
ALT: 11 U/L (ref 0–44)
AST: 13 U/L — ABNORMAL LOW (ref 15–41)
Albumin: 4.4 g/dL (ref 3.5–5.0)
Alkaline Phosphatase: 70 U/L (ref 38–126)
Anion gap: 7 (ref 5–15)
BUN: 18 mg/dL (ref 8–23)
CO2: 29 mmol/L (ref 22–32)
Calcium: 9.2 mg/dL (ref 8.9–10.3)
Chloride: 100 mmol/L (ref 98–111)
Creatinine: 1.25 mg/dL — ABNORMAL HIGH (ref 0.61–1.24)
GFR, Estimated: 58 mL/min — ABNORMAL LOW (ref >60)
Glucose, Bld: 147 mg/dL — ABNORMAL HIGH (ref 70–99)
Potassium: 4 mmol/L (ref 3.5–5.1)
Sodium: 136 mmol/L (ref 135–145)
Total Bilirubin: 0.5 mg/dL (ref 0.0–1.2)
Total Protein: 6.2 g/dL — ABNORMAL LOW (ref 6.5–8.1)

## 2023-10-22 LAB — LACTATE DEHYDROGENASE: LDH: 143 U/L (ref 98–192)

## 2023-10-23 NOTE — Progress Notes (Signed)
 Carelink Summary Report / Loop Recorder

## 2023-10-25 ENCOUNTER — Other Ambulatory Visit: Payer: Self-pay

## 2023-10-25 DIAGNOSIS — D696 Thrombocytopenia, unspecified: Secondary | ICD-10-CM

## 2023-10-25 DIAGNOSIS — D649 Anemia, unspecified: Secondary | ICD-10-CM

## 2023-10-25 DIAGNOSIS — C9110 Chronic lymphocytic leukemia of B-cell type not having achieved remission: Secondary | ICD-10-CM

## 2023-10-27 NOTE — Progress Notes (Signed)
 HEMATOLOGY ONCOLOGY CLINIC NOTE  Date of Service: 10/28/2023   Patient Care Team: Dorena Gander, MD as PCP - General (Family Medicine) Knox Perl, MD as PCP - Cardiology (Cardiology) Boyce Byes, MD as PCP - Electrophysiology (Cardiology) Frankie Israel, MD as Consulting Physician (Hematology)  CHIEF COMPLAINTS/PURPOSE OF CONSULTATION:  Continued evaluation and management of CLL  HISTORY OF PRESENTING ILLNESS:   Eric Harrington 83 y.o. male is here because of a referral from Dr. Randle Butler from [facility] regarding a trend in his elevated WBC.   He is accompanied today by his wife of 54 years. The pt reports that he is doing well overall. He recently had a biopsy to check his prostate at [facility] with Dr. Ozella Blush and they took 12 core samples with reportedly no significant findings.  Of note prior to the patient's visit today, pt has had CBC completed on 04/26/17 with results revealing WBC at 14.7, Hgb at 12.6, Lymph Abs at 11.0 with all other values WNL. On 03/26/17 his WBC were 12.8, Hgb at 13.3 and Lymph's at  9.90. On 02/06/17 his WBC were 12.7, Hgb at 13.4, and Lymph's at 8.80.   On review of systems, pt reports post nasal drip, persistent cough, occasional troublesome stomach, pain in his lower abdomen that feels like his muscles are irritated, reports he has lost 25 lbs in the last 2.5 years (he associates a decreased appetite after taking beta blockers during this period) and denies no recent colds or infections, acute changes in energy levels, and leg swelling.   On PMHx the pt reports taking Zetia. He has IB'S and has had 3 colonoscopies with no significant findings or inflammatory processes. He reports that over the last 2.5 years he had heart catheterizations that all turned out well. He reports having diabetes. He also reports neuropathy along his whole right side that lasted for a few months that ceased abruptly.   Interval History:   Eric Harrington is a wonderful 83 y.o. gentleman who is here for continued evaluation and management of CLL. Patient was last seen by me on 10/15/2023 and complained of weakness, tiredness, one episode of epistaxis, two small cavities, unchanged chronic stomach issues, and several lumps/bumps which fluctuate in size.  Patient notes that he was tolerating his venetoclax  well overall but has been off of this for 3 weeks due to his neutropenia.  His neutropenia was present even before the venetoclax  and was thought to be related to CLL.  His CLL is improving though the neutropenia is still persistent and there was concern that this could be related to his use of amiodarone . Discussed with Dr Berry Bristol. He recommended holding Amiodarone . No bleeding issues.   MEDICAL HISTORY:  1. HTN 2. DM2 3. H/o PAC's 4. transient a fib with cardiac cath 5. HLD 6. CKD stage 3 7. Irritable bowel syndrome 8. GERD 9. H/o presyndope  SURGICAL HISTORY:  1. Prostate bx 2. Cardiac cath  SOCIAL HISTORY: Social History   Socioeconomic History   Marital status: Married    Spouse name: Not on file   Number of children: 2   Years of education: Not on file   Highest education level: Not on file  Occupational History   Not on file  Tobacco Use   Smoking status: Former    Current packs/day: 0.00    Average packs/day: 1 pack/day for 30.0 years (30.0 ttl pk-yrs)    Types: Cigarettes    Start date:  1950    Quit date: 68    Years since quitting: 45.4   Smokeless tobacco: Never   Tobacco comments:    started at age 72  Vaping Use   Vaping status: Never Used  Substance and Sexual Activity   Alcohol use: Never   Drug use: Not Currently   Sexual activity: Not Currently    Birth control/protection: None  Other Topics Concern   Not on file  Social History Narrative   2 Cups of Coffee   Right Handed    Social Drivers of Health   Financial Resource Strain: Low Risk  (05/20/2023)   Overall Financial Resource Strain  (CARDIA)    Difficulty of Paying Living Expenses: Not hard at all  Food Insecurity: No Food Insecurity (05/20/2023)   Hunger Vital Sign    Worried About Running Out of Food in the Last Year: Never true    Ran Out of Food in the Last Year: Never true  Transportation Needs: No Transportation Needs (05/20/2023)   PRAPARE - Administrator, Civil Service (Medical): No    Lack of Transportation (Non-Medical): No  Physical Activity: Inactive (05/20/2023)   Exercise Vital Sign    Days of Exercise per Week: 0 days    Minutes of Exercise per Session: 0 min  Stress: No Stress Concern Present (05/20/2023)   [employer] of Occupational Health - Occupational Stress Questionnaire    Feeling of Stress : Only a little  Social Connections: Moderately Integrated (05/20/2023)   Social Connection and Isolation Panel    Frequency of Communication with Friends and Family: More than three times a week    Frequency of Social Gatherings with Friends and Family: Twice a week    Attends Religious Services: More than 4 times per year    Active Member of [employer] or Organizations: No    Attends Banker Meetings: Never    Marital Status: Married  Catering manager Violence: Not At Risk (05/20/2023)   Humiliation, Afraid, Rape, and Kick questionnaire    Fear of Current or Ex-Partner: No    Emotionally Abused: No    Physically Abused: No    Sexually Abused: No    FAMILY HISTORY: Family History  Problem Relation Age of Onset   Lung cancer Mother    Heart attack Father    Dementia Sister    Diabetes Brother    Diabetes Sister    Heart attack Brother     ALLERGIES:  is allergic to norvasc [amlodipine besylate], augmentin [amoxicillin -pot clavulanate], bystolic [nebivolol hcl], coq10 [coenzyme q10], cozaar [losartan], farxiga [dapagliflozin], glucotrol [glipizide], januvia [sitagliptin], keflex [cephalexin], lipitor [atorvastatin calcium ], lipitor [atorvastatin], lopid [gemfibrozil],  omnicef [cefdinir], prilosec [omeprazole], toprol  xl [metoprolol ], vibra-tab [doxycycline], zestril [lisinopril], zetia [ezetimibe], zocor [simvastatin], and neurontin [gabapentin].  MEDICATIONS:  Current Outpatient Medications  Medication Sig Dispense Refill   acetaminophen  (TYLENOL ) 500 MG tablet Take 1,000 mg by mouth every 8 (eight) hours as needed for moderate pain, fever or headache.     amiodarone  (PACERONE ) 200 MG tablet Take 0.5 tablets (100 mg total) by mouth daily. 45 tablet 1   apixaban  (ELIQUIS ) 5 MG TABS tablet Take 1 tablet (5 mg total) by mouth 2 (two) times daily. 60 tablet 0   ASCORBIC ACID  PO Take 1 tablet by mouth daily. Vitamin C .     cetirizine (ZYRTEC) 10 MG tablet Take 10 mg by mouth daily as needed for allergies.     Cholecalciferol  (VITAMIN D -3 PO) Take 1 capsule by  mouth daily.     Cyanocobalamin  (VITAMIN B12) 1000 MCG TBCR Take 1 tablet by mouth daily.     docusate sodium  (COLACE) 100 MG capsule Take 100 mg by mouth daily.      esomeprazole (NEXIUM 24HR) 20 MG capsule Take 20 mg by mouth daily at 12 noon.     ferrous sulfate  325 (65 FE) MG tablet Take 1 tablet (325 mg total) by mouth every other day. 15 tablet 0   fexofenadine (ALLEGRA) 180 MG tablet Take 180 mg by mouth daily.     FIBER PO Take 3 tablets by mouth daily.     fludrocortisone  (FLORINEF ) 0.1 MG tablet Take 1 tablet (100 mcg total) by mouth every evening. 90 tablet 1   fluticasone  (FLONASE ) 50 MCG/ACT nasal spray Place 1 spray into both nostrils daily as needed for allergies.     glimepiride  (AMARYL ) 2 MG tablet Take 2 mg by mouth daily with breakfast.     metFORMIN  (GLUCOPHAGE ) 500 MG tablet Take 500 mg by mouth 2 (two) times daily with a meal.     midodrine  (PROAMATINE ) 10 MG tablet Take 1 tablet (10 mg total) by mouth 3 (three) times daily with meals. While awake. Do not lay down for 3-4 hours after taking 270 tablet 3   Multiple Vitamins-Minerals (ZINC PO) Take 1 tablet by mouth daily.      ondansetron  (ZOFRAN ) 4 MG tablet Take 1 tablet (4 mg total) by mouth every 8 (eight) hours as needed for nausea or vomiting. 20 tablet 0   Polyethyl Glycol-Propyl Glycol (SYSTANE OP) Place 1 drop into both eyes 2 (two) times daily as needed (dryness, irritation.).     Pyridoxine  HCl (VITAMIN B-6 PO) Take 1 tablet by mouth daily.     rosuvastatin  (CRESTOR ) 5 MG tablet Take 5 mg by mouth every evening.     venetoclax  (VENCLEXTA ) 100 MG tablet Take 1 tablet (100 mg total) by mouth daily. Tablets should be swallowed whole with a meal and a full glass of water. 30 tablet 3   No current facility-administered medications for this visit.    REVIEW OF SYSTEMS:    10 Point review of Systems was done is negative except as noted above.   PHYSICAL EXAMINATION:  Vitals:   10/28/23 1228  BP: (!) 115/48  Pulse: 64  Resp: 18  Temp: (!) 97.3 F (36.3 C)  SpO2: 100%     Filed Weights   10/28/23 1228  Weight: 161 lb 6.4 oz (73.2 kg)     GENERAL:alert, in no acute distress and comfortable SKIN: no acute rashes, no significant lesions EYES: conjunctiva are pink and non-injected, sclera anicteric OROPHARYNX: MMM, no exudates, no oropharyngeal erythema or ulceration NECK: supple, no JVD LYMPH:  no palpable lymphadenopathy in the cervical, axillary or inguinal regions LUNGS: clear to auscultation b/l with normal respiratory effort HEART: regular rate & rhythm ABDOMEN:  normoactive bowel sounds , non tender, not distended. Extremity: no pedal edema PSYCH: alert & oriented x 3 with fluent speech NEURO: no focal motor/sensory deficits   LABORATORY DATA:   I have reviewed the data as listed  .    Latest Ref Rng & Units 10/28/2023   11:55 AM 10/22/2023   11:22 AM 10/15/2023   10:25 AM  CBC  WBC 4.0 - 10.5 K/uL 4.4  3.2  2.3   Hemoglobin 13.0 - 17.0 g/dL 9.7  9.3  7.6   Hematocrit 39.0 - 52.0 % 28.6  27.2  22.9   Platelets  150 - 400 K/uL 62  61  54     . CBC    Component Value Date/Time    WBC 4.4 10/28/2023 1155   WBC 75.8 (HH) 05/24/2023 1048   RBC 2.89 (L) 10/28/2023 1155   HGB 9.7 (L) 10/28/2023 1155   HGB 7.8 (L) 09/09/2023 1330   HCT 28.6 (L) 10/28/2023 1155   HCT 26.0 (L) 09/09/2023 1330   PLT 62 (L) 10/28/2023 1155   PLT 93 (LL) 09/09/2023 1330   MCV 99.0 10/28/2023 1155   MCV 104 (H) 09/09/2023 1330   MCH 33.6 10/28/2023 1155   MCHC 33.9 10/28/2023 1155   RDW 16.7 (H) 10/28/2023 1155   RDW 14.8 09/09/2023 1330   LYMPHSABS 3.8 10/28/2023 1155   MONOABS 0.2 10/28/2023 1155   EOSABS 0.0 10/28/2023 1155   BASOSABS 0.0 10/28/2023 1155   .Aaron Aas Lab Results  Component Value Date   RETICCTPCT 1.4 04/24/2018   RBC 2.89 (L) 10/28/2023       Latest Ref Rng & Units 10/28/2023   11:55 AM 10/22/2023   11:22 AM 10/15/2023   10:25 AM  CMP  Glucose 70 - 99 mg/dL 130  865  784   BUN 8 - 23 mg/dL 19  18  19    Creatinine 0.61 - 1.24 mg/dL 6.96  2.95  2.84   Sodium 135 - 145 mmol/L 135  136  135   Potassium 3.5 - 5.1 mmol/L 3.8  4.0  3.6   Chloride 98 - 111 mmol/L 101  100  100   CO2 22 - 32 mmol/L 27  29  27    Calcium  8.9 - 10.3 mg/dL 9.0  9.2  9.2   Total Protein 6.5 - 8.1 g/dL 6.3  6.2  6.3   Total Bilirubin 0.0 - 1.2 mg/dL 0.5  0.5  0.5   Alkaline Phos 38 - 126 U/L 78  70  80   AST 15 - 41 U/L 14  13  13    ALT 0 - 44 U/L 10  11  9     . Lab Results  Component Value Date   LDH 156 10/28/2023   Component     Latest Ref Rng & Units 06/13/2017  HCV Ab     0.0 - 0.9 s/co ratio <0.1  Hepatitis B Surface Ag     Negative Negative  Hep B Core Ab, Tot     Negative Negative         RADIOGRAPHIC STUDIES: I have personally reviewed the radiological images as listed and agreed with the findings in the report. CUP PACEART REMOTE DEVICE CHECK Result Date: 10/16/2023 ILR summary report received. Battery status OK. Normal device function. No new symptom, tachy, brady, or pause episodes. No new AF episodes. Monthly summary reports and ROV/PRN [place],  CVRS    ASSESSMENT & PLAN:   83 y.o. is a male with  1. Chronic lymphocytic leukemia. Likely Rai Stage 0, CLL FISH panel with 13 q. deletion  -he presented with Lymphocytosis incidentally noted on routine labs No associated significant anemia or thrombocytopenia. No constitutional symptoms No overt clinically palpable LNadenopathy or hepato-splenomegaly. 13q mutation present   08/28/17 CT C/A/P revealed Normal sized spleen, no enlarged lymph nodes. There is evidence of very small lung nodule, likely related to inflammatory process. Will monitor with CT chest in 12 months    PLAN:  -Discussed lab results from today, 10/28/2023, in detail with patient - patients hgb and plt improving. -patient still with neutropenia ANC  400 - holding Allopurinol  -Hold Amiodarone  -- discussed with Dr Berry Bristol.. its possible this is an additional cause of the neutropenia. -restart venetoclax  @ 100mg  po daily -G-CSF daily x 3 days Labs in 1 week RTC with Dr Salomon Cree with labs in 3 weeks   FOLLOW-UP: G-CSF daily x 3 days Labs in 1 week RTC with Dr Salomon Cree with labs in 3 weeks   The total time spent in the appointment was 30 minutes* .  All of the patient's questions were answered with apparent satisfaction. The patient knows to call the clinic with any problems, questions or concerns.   Jacquelyn Matt MD MS AAHIVMS [facility] Hematology/Oncology Physician [facility]  .*Total Encounter Time as defined by the Centers for Medicare and Medicaid Services includes, in addition to the face-to-face time of a patient visit (documented in the note above) non-face-to-face time: obtaining and reviewing outside history, ordering and reviewing medications, tests or procedures, care coordination (communications with other health care professionals or caregivers) and documentation in the medical record.    I,Mitra Faeizi,acting as a Neurosurgeon for Jacquelyn Matt, MD.,have documented all relevant documentation on the  behalf of Jacquelyn Matt, MD,as directed by  Jacquelyn Matt, MD while in the presence of Jacquelyn Matt, MD.  .I have reviewed the above documentation for accuracy and completeness, and I agree with the above. .Rickayla Wieland Kishore Terance Pomplun MD

## 2023-10-28 ENCOUNTER — Inpatient Hospital Stay

## 2023-10-28 ENCOUNTER — Other Ambulatory Visit: Payer: Self-pay | Admitting: Pharmacist

## 2023-10-28 ENCOUNTER — Other Ambulatory Visit

## 2023-10-28 ENCOUNTER — Inpatient Hospital Stay (HOSPITAL_BASED_OUTPATIENT_CLINIC_OR_DEPARTMENT_OTHER): Admitting: Hematology

## 2023-10-28 VITALS — BP 115/48 | HR 64 | Temp 97.3°F | Resp 18 | Wt 161.4 lb

## 2023-10-28 DIAGNOSIS — D701 Agranulocytosis secondary to cancer chemotherapy: Secondary | ICD-10-CM | POA: Diagnosis not present

## 2023-10-28 DIAGNOSIS — D649 Anemia, unspecified: Secondary | ICD-10-CM

## 2023-10-28 DIAGNOSIS — D696 Thrombocytopenia, unspecified: Secondary | ICD-10-CM

## 2023-10-28 DIAGNOSIS — T451X5A Adverse effect of antineoplastic and immunosuppressive drugs, initial encounter: Secondary | ICD-10-CM

## 2023-10-28 DIAGNOSIS — C9110 Chronic lymphocytic leukemia of B-cell type not having achieved remission: Secondary | ICD-10-CM

## 2023-10-28 DIAGNOSIS — D709 Neutropenia, unspecified: Secondary | ICD-10-CM | POA: Insufficient documentation

## 2023-10-28 LAB — CBC WITH DIFFERENTIAL (CANCER CENTER ONLY)
Abs Immature Granulocytes: 0 10*3/uL (ref 0.00–0.07)
Basophils Absolute: 0 10*3/uL (ref 0.0–0.1)
Basophils Relative: 0 %
Eosinophils Absolute: 0 10*3/uL (ref 0.0–0.5)
Eosinophils Relative: 0 %
HCT: 28.6 % — ABNORMAL LOW (ref 39.0–52.0)
Hemoglobin: 9.7 g/dL — ABNORMAL LOW (ref 13.0–17.0)
Lymphocytes Relative: 87 %
Lymphs Abs: 3.8 10*3/uL (ref 0.7–4.0)
MCH: 33.6 pg (ref 26.0–34.0)
MCHC: 33.9 g/dL (ref 30.0–36.0)
MCV: 99 fL (ref 80.0–100.0)
Monocytes Absolute: 0.2 10*3/uL (ref 0.1–1.0)
Monocytes Relative: 4 %
Neutro Abs: 0.4 10*3/uL — CL (ref 1.7–7.7)
Neutrophils Relative %: 9 %
Platelet Count: 62 10*3/uL — ABNORMAL LOW (ref 150–400)
RBC: 2.89 MIL/uL — ABNORMAL LOW (ref 4.22–5.81)
RDW: 16.7 % — ABNORMAL HIGH (ref 11.5–15.5)
Smear Review: NORMAL
WBC Count: 4.4 10*3/uL (ref 4.0–10.5)
nRBC: 0 % (ref 0.0–0.2)

## 2023-10-28 LAB — CMP (CANCER CENTER ONLY)
ALT: 10 U/L (ref 0–44)
AST: 14 U/L — ABNORMAL LOW (ref 15–41)
Albumin: 4.5 g/dL (ref 3.5–5.0)
Alkaline Phosphatase: 78 U/L (ref 38–126)
Anion gap: 7 (ref 5–15)
BUN: 19 mg/dL (ref 8–23)
CO2: 27 mmol/L (ref 22–32)
Calcium: 9 mg/dL (ref 8.9–10.3)
Chloride: 101 mmol/L (ref 98–111)
Creatinine: 1.31 mg/dL — ABNORMAL HIGH (ref 0.61–1.24)
GFR, Estimated: 55 mL/min — ABNORMAL LOW (ref >60)
Glucose, Bld: 144 mg/dL — ABNORMAL HIGH (ref 70–99)
Potassium: 3.8 mmol/L (ref 3.5–5.1)
Sodium: 135 mmol/L (ref 135–145)
Total Bilirubin: 0.5 mg/dL (ref 0.0–1.2)
Total Protein: 6.3 g/dL — ABNORMAL LOW (ref 6.5–8.1)

## 2023-10-28 LAB — SAMPLE TO BLOOD BANK

## 2023-10-28 LAB — LACTATE DEHYDROGENASE: LDH: 156 U/L (ref 98–192)

## 2023-10-28 LAB — URIC ACID: Uric Acid, Serum: 4.3 mg/dL (ref 3.7–8.6)

## 2023-10-28 LAB — PHOSPHORUS: Phosphorus: 2.8 mg/dL (ref 2.5–4.6)

## 2023-10-28 MED ORDER — FILGRASTIM-SNDZ 480 MCG/0.8ML IJ SOSY
480.0000 ug | PREFILLED_SYRINGE | Freq: Every day | INTRAMUSCULAR | Status: DC
  Administered 2023-10-28: 480 ug via SUBCUTANEOUS
  Filled 2023-10-28: qty 0.8

## 2023-10-29 ENCOUNTER — Inpatient Hospital Stay

## 2023-10-29 VITALS — BP 131/70 | HR 74 | Resp 17

## 2023-10-29 DIAGNOSIS — D701 Agranulocytosis secondary to cancer chemotherapy: Secondary | ICD-10-CM

## 2023-10-29 DIAGNOSIS — C9110 Chronic lymphocytic leukemia of B-cell type not having achieved remission: Secondary | ICD-10-CM | POA: Diagnosis not present

## 2023-10-29 MED ORDER — FILGRASTIM-SNDZ 480 MCG/0.8ML IJ SOSY
480.0000 ug | PREFILLED_SYRINGE | Freq: Once | INTRAMUSCULAR | Status: AC
  Administered 2023-10-29: 480 ug via SUBCUTANEOUS
  Filled 2023-10-29: qty 0.8

## 2023-10-30 ENCOUNTER — Inpatient Hospital Stay

## 2023-10-30 VITALS — BP 122/67 | HR 88 | Temp 98.0°F | Resp 18

## 2023-10-30 DIAGNOSIS — C9110 Chronic lymphocytic leukemia of B-cell type not having achieved remission: Secondary | ICD-10-CM | POA: Diagnosis not present

## 2023-10-30 DIAGNOSIS — T451X5A Adverse effect of antineoplastic and immunosuppressive drugs, initial encounter: Secondary | ICD-10-CM

## 2023-10-30 MED ORDER — FILGRASTIM-SNDZ 480 MCG/0.8ML IJ SOSY
480.0000 ug | PREFILLED_SYRINGE | Freq: Once | INTRAMUSCULAR | Status: AC
  Administered 2023-10-30: 480 ug via SUBCUTANEOUS
  Filled 2023-10-30: qty 0.8

## 2023-10-31 ENCOUNTER — Ambulatory Visit

## 2023-11-01 ENCOUNTER — Other Ambulatory Visit: Payer: Self-pay

## 2023-11-01 DIAGNOSIS — C9110 Chronic lymphocytic leukemia of B-cell type not having achieved remission: Secondary | ICD-10-CM

## 2023-11-04 ENCOUNTER — Ambulatory Visit: Admitting: Pulmonary Disease

## 2023-11-04 ENCOUNTER — Telehealth: Payer: Self-pay | Admitting: Cardiology

## 2023-11-04 ENCOUNTER — Encounter: Payer: Self-pay | Admitting: Hematology

## 2023-11-04 ENCOUNTER — Inpatient Hospital Stay

## 2023-11-04 DIAGNOSIS — C9110 Chronic lymphocytic leukemia of B-cell type not having achieved remission: Secondary | ICD-10-CM

## 2023-11-04 LAB — CBC WITH DIFFERENTIAL (CANCER CENTER ONLY)
Abs Immature Granulocytes: 0.08 10*3/uL — ABNORMAL HIGH (ref 0.00–0.07)
Basophils Absolute: 0 10*3/uL (ref 0.0–0.1)
Basophils Relative: 0 %
Eosinophils Absolute: 0 10*3/uL (ref 0.0–0.5)
Eosinophils Relative: 0 %
HCT: 29 % — ABNORMAL LOW (ref 39.0–52.0)
Hemoglobin: 9.7 g/dL — ABNORMAL LOW (ref 13.0–17.0)
Immature Granulocytes: 1 %
Lymphocytes Relative: 74 %
Lymphs Abs: 4.3 10*3/uL — ABNORMAL HIGH (ref 0.7–4.0)
MCH: 33.2 pg (ref 26.0–34.0)
MCHC: 33.4 g/dL (ref 30.0–36.0)
MCV: 99.3 fL (ref 80.0–100.0)
Monocytes Absolute: 0.3 10*3/uL (ref 0.1–1.0)
Monocytes Relative: 4 %
Neutro Abs: 1.2 10*3/uL — ABNORMAL LOW (ref 1.7–7.7)
Neutrophils Relative %: 21 %
Platelet Count: 58 10*3/uL — ABNORMAL LOW (ref 150–400)
RBC: 2.92 MIL/uL — ABNORMAL LOW (ref 4.22–5.81)
RDW: 15.8 % — ABNORMAL HIGH (ref 11.5–15.5)
Smear Review: NORMAL
WBC Count: 5.9 10*3/uL (ref 4.0–10.5)
nRBC: 0 % (ref 0.0–0.2)

## 2023-11-04 LAB — SAMPLE TO BLOOD BANK

## 2023-11-04 LAB — CMP (CANCER CENTER ONLY)
ALT: 11 U/L (ref 0–44)
AST: 13 U/L — ABNORMAL LOW (ref 15–41)
Albumin: 4.3 g/dL (ref 3.5–5.0)
Alkaline Phosphatase: 71 U/L (ref 38–126)
Anion gap: 7 (ref 5–15)
BUN: 18 mg/dL (ref 8–23)
CO2: 30 mmol/L (ref 22–32)
Calcium: 9.7 mg/dL (ref 8.9–10.3)
Chloride: 99 mmol/L (ref 98–111)
Creatinine: 1.35 mg/dL — ABNORMAL HIGH (ref 0.61–1.24)
GFR, Estimated: 53 mL/min — ABNORMAL LOW (ref >60)
Glucose, Bld: 118 mg/dL — ABNORMAL HIGH (ref 70–99)
Potassium: 3.8 mmol/L (ref 3.5–5.1)
Sodium: 136 mmol/L (ref 135–145)
Total Bilirubin: 0.3 mg/dL (ref 0.0–1.2)
Total Protein: 6.1 g/dL — ABNORMAL LOW (ref 6.5–8.1)

## 2023-11-04 NOTE — Telephone Encounter (Signed)
 Pt c/o medication issue:  1. Name of Medication:   apixaban  (ELIQUIS ) 5 MG TABS tablet    2. How are you currently taking this medication (dosage and times per day)? As written  3. Are you having a reaction (difficulty breathing--STAT)? No   4. What is your medication issue? Pt is having nose bleeds

## 2023-11-04 NOTE — Telephone Encounter (Signed)
 Spoke with pt and notified him of Dr. Maida Sciara suggestions. Pt verbalized understanding. All questions if any were answered.

## 2023-11-04 NOTE — Telephone Encounter (Signed)
 Please advise patient to have Afrin handy for nose bleeds and ice packs, not to clean the clots for a couple days otherwise will rebleed

## 2023-11-04 NOTE — Telephone Encounter (Signed)
 Spoke with pt regarding his nose bleeding. Pt stated that for the last 3 weeks he has been having nose bleeds, one lasting about 3 hours. Pt stated he has been able to control the bleeds for the most part with pressure and stuffing tissues up his nose. Pt stated his nose is currently bleeding but is not bleeding very much. Pt was told that if his nose begins to bleed more and he cannot stop the bleeding that he should go to the ED. Pt was told the information he has provided would be sent to Dr. Berry Bristol and his nurse for advice. Pt verbalized understanding. All questions if any were answered.

## 2023-11-13 ENCOUNTER — Other Ambulatory Visit: Payer: Self-pay

## 2023-11-14 ENCOUNTER — Ambulatory Visit: Payer: Self-pay | Admitting: Cardiology

## 2023-11-14 ENCOUNTER — Ambulatory Visit (INDEPENDENT_AMBULATORY_CARE_PROVIDER_SITE_OTHER): Payer: Medicare Other

## 2023-11-14 DIAGNOSIS — G459 Transient cerebral ischemic attack, unspecified: Secondary | ICD-10-CM

## 2023-11-14 LAB — CUP PACEART REMOTE DEVICE CHECK
Date Time Interrogation Session: 20250626050359
Implantable Pulse Generator Implant Date: 20240716
Pulse Gen Model: 5000
Pulse Gen Serial Number: 511029620

## 2023-11-18 ENCOUNTER — Other Ambulatory Visit: Payer: Self-pay

## 2023-11-18 DIAGNOSIS — C9110 Chronic lymphocytic leukemia of B-cell type not having achieved remission: Secondary | ICD-10-CM

## 2023-11-19 ENCOUNTER — Inpatient Hospital Stay: Attending: Hematology | Admitting: Hematology

## 2023-11-19 ENCOUNTER — Inpatient Hospital Stay

## 2023-11-19 VITALS — BP 125/72 | HR 80 | Temp 98.1°F | Resp 18 | Wt 160.1 lb

## 2023-11-19 DIAGNOSIS — Z87891 Personal history of nicotine dependence: Secondary | ICD-10-CM | POA: Insufficient documentation

## 2023-11-19 DIAGNOSIS — K219 Gastro-esophageal reflux disease without esophagitis: Secondary | ICD-10-CM | POA: Diagnosis not present

## 2023-11-19 DIAGNOSIS — E1122 Type 2 diabetes mellitus with diabetic chronic kidney disease: Secondary | ICD-10-CM | POA: Insufficient documentation

## 2023-11-19 DIAGNOSIS — Z7984 Long term (current) use of oral hypoglycemic drugs: Secondary | ICD-10-CM | POA: Insufficient documentation

## 2023-11-19 DIAGNOSIS — C9110 Chronic lymphocytic leukemia of B-cell type not having achieved remission: Secondary | ICD-10-CM | POA: Diagnosis not present

## 2023-11-19 DIAGNOSIS — Z7969 Long term (current) use of other immunomodulators and immunosuppressants: Secondary | ICD-10-CM | POA: Insufficient documentation

## 2023-11-19 DIAGNOSIS — I10 Essential (primary) hypertension: Secondary | ICD-10-CM | POA: Insufficient documentation

## 2023-11-19 DIAGNOSIS — Z801 Family history of malignant neoplasm of trachea, bronchus and lung: Secondary | ICD-10-CM | POA: Diagnosis not present

## 2023-11-19 DIAGNOSIS — D649 Anemia, unspecified: Secondary | ICD-10-CM

## 2023-11-19 DIAGNOSIS — Z79899 Other long term (current) drug therapy: Secondary | ICD-10-CM | POA: Insufficient documentation

## 2023-11-19 DIAGNOSIS — Z7901 Long term (current) use of anticoagulants: Secondary | ICD-10-CM | POA: Insufficient documentation

## 2023-11-19 DIAGNOSIS — M7989 Other specified soft tissue disorders: Secondary | ICD-10-CM | POA: Insufficient documentation

## 2023-11-19 DIAGNOSIS — R1030 Lower abdominal pain, unspecified: Secondary | ICD-10-CM | POA: Diagnosis not present

## 2023-11-19 DIAGNOSIS — N183 Chronic kidney disease, stage 3 unspecified: Secondary | ICD-10-CM | POA: Insufficient documentation

## 2023-11-19 DIAGNOSIS — K589 Irritable bowel syndrome without diarrhea: Secondary | ICD-10-CM | POA: Diagnosis not present

## 2023-11-19 DIAGNOSIS — D709 Neutropenia, unspecified: Secondary | ICD-10-CM | POA: Diagnosis not present

## 2023-11-19 DIAGNOSIS — E785 Hyperlipidemia, unspecified: Secondary | ICD-10-CM | POA: Insufficient documentation

## 2023-11-19 DIAGNOSIS — I4891 Unspecified atrial fibrillation: Secondary | ICD-10-CM | POA: Diagnosis not present

## 2023-11-19 DIAGNOSIS — D696 Thrombocytopenia, unspecified: Secondary | ICD-10-CM

## 2023-11-19 DIAGNOSIS — R0982 Postnasal drip: Secondary | ICD-10-CM | POA: Diagnosis not present

## 2023-11-19 DIAGNOSIS — R053 Chronic cough: Secondary | ICD-10-CM | POA: Insufficient documentation

## 2023-11-19 DIAGNOSIS — M549 Dorsalgia, unspecified: Secondary | ICD-10-CM | POA: Diagnosis not present

## 2023-11-19 LAB — CBC WITH DIFFERENTIAL (CANCER CENTER ONLY)
Abs Immature Granulocytes: 0.01 10*3/uL (ref 0.00–0.07)
Basophils Absolute: 0 10*3/uL (ref 0.0–0.1)
Basophils Relative: 0 %
Eosinophils Absolute: 0 10*3/uL (ref 0.0–0.5)
Eosinophils Relative: 0 %
HCT: 28.4 % — ABNORMAL LOW (ref 39.0–52.0)
Hemoglobin: 9.8 g/dL — ABNORMAL LOW (ref 13.0–17.0)
Immature Granulocytes: 1 %
Lymphocytes Relative: 63 %
Lymphs Abs: 1.3 10*3/uL (ref 0.7–4.0)
MCH: 33 pg (ref 26.0–34.0)
MCHC: 34.5 g/dL (ref 30.0–36.0)
MCV: 95.6 fL (ref 80.0–100.0)
Monocytes Absolute: 0.2 10*3/uL (ref 0.1–1.0)
Monocytes Relative: 8 %
Neutro Abs: 0.6 10*3/uL — ABNORMAL LOW (ref 1.7–7.7)
Neutrophils Relative %: 28 %
Platelet Count: 62 10*3/uL — ABNORMAL LOW (ref 150–400)
RBC: 2.97 MIL/uL — ABNORMAL LOW (ref 4.22–5.81)
RDW: 13.9 % (ref 11.5–15.5)
Smear Review: NORMAL
WBC Count: 2.1 10*3/uL — ABNORMAL LOW (ref 4.0–10.5)
nRBC: 0 % (ref 0.0–0.2)

## 2023-11-19 LAB — SAMPLE TO BLOOD BANK

## 2023-11-19 LAB — CMP (CANCER CENTER ONLY)
ALT: 15 U/L (ref 0–44)
AST: 15 U/L (ref 15–41)
Albumin: 4.5 g/dL (ref 3.5–5.0)
Alkaline Phosphatase: 80 U/L (ref 38–126)
Anion gap: 5 (ref 5–15)
BUN: 19 mg/dL (ref 8–23)
CO2: 30 mmol/L (ref 22–32)
Calcium: 9.2 mg/dL (ref 8.9–10.3)
Chloride: 99 mmol/L (ref 98–111)
Creatinine: 1.26 mg/dL — ABNORMAL HIGH (ref 0.61–1.24)
GFR, Estimated: 57 mL/min — ABNORMAL LOW (ref >60)
Glucose, Bld: 178 mg/dL — ABNORMAL HIGH (ref 70–99)
Potassium: 4.1 mmol/L (ref 3.5–5.1)
Sodium: 134 mmol/L — ABNORMAL LOW (ref 135–145)
Total Bilirubin: 0.5 mg/dL (ref 0.0–1.2)
Total Protein: 6.2 g/dL — ABNORMAL LOW (ref 6.5–8.1)

## 2023-11-19 LAB — LACTATE DEHYDROGENASE: LDH: 146 U/L (ref 98–192)

## 2023-11-19 NOTE — Progress Notes (Signed)
 HEMATOLOGY ONCOLOGY CLINIC NOTE  Date of Service: 11/19/23   Patient Care Team: Rolinda Millman, MD as PCP - General (Family Medicine) Ladona Heinz, MD as PCP - Cardiology (Cardiology) Cindie Ole DASEN, MD as PCP - Electrophysiology (Cardiology) Onesimo Emaline Brink, MD as Consulting Physician (Hematology)  CHIEF COMPLAINTS/PURPOSE OF CONSULTATION:  Continued evaluation and management of CLL  HISTORY OF PRESENTING ILLNESS:   Eric Harrington 83 y.o. male is here because of a referral from Dr. Camille from [facility] regarding a trend in his elevated WBC.   He is accompanied today by his wife of 54 years. The pt reports that he is doing well overall. He recently had a biopsy to check his prostate at [facility] with Dr. Medford Han and they took 12 core samples with reportedly no significant findings.  Of note prior to the patient's visit today, pt has had CBC completed on 04/26/17 with results revealing WBC at 14.7, Hgb at 12.6, Lymph Abs at 11.0 with all other values WNL. On 03/26/17 his WBC were 12.8, Hgb at 13.3 and Lymph's at  9.90. On 02/06/17 his WBC were 12.7, Hgb at 13.4, and Lymph's at 8.80.   On review of systems, pt reports post nasal drip, persistent cough, occasional troublesome stomach, pain in his lower abdomen that feels like his muscles are irritated, reports he has lost 25 lbs in the last 2.5 years (he associates a decreased appetite after taking beta blockers during this period) and denies no recent colds or infections, acute changes in energy levels, and leg swelling.   On PMHx the pt reports taking Zetia. He has IB'S and has had 3 colonoscopies with no significant findings or inflammatory processes. He reports that over the last 2.5 years he had heart catheterizations that all turned out well. He reports having diabetes. He also reports neuropathy along his whole right side that lasted for a few months that ceased abruptly.   Interval History:   Eric Harrington is a wonderful 83 y.o. gentleman who is here for continued evaluation and management of CLL.   Patient was last seen by me on 10/28/2023 and noted to have persistent neutropenia but otherwise doing well.   Patient present with a cane and is accompanied by his wife.  Patient is well hydrated. He reports fluctuating appetite though his weight is stable.   He reports that he is tolerating 100 MG Venetoclax  well with no major toxicities. Patient reports mild nausea yesterday morning, though he notes that his blood pressure was also low at that time.  Patient denies any fever, chills, new lumps/bumps, bleeding or infection issues.  He reports back pain and foot swelling sometimes. Patient denies leg swelling at this time.   His wife reports he sweats frequently inside the home.  He is not currently on Amiodarone , Allopurinol , or Metformin .   Patient's wife notes patient was seen by his PCP for a constant stomach ache which has improved following cessation of Metformin .  He reports that he is taking vitamin B6 and B12. Patient does not take vitamin B complex.   Patient is continuing low dosage of Glyburide 0.5 for his DM2. He notes that his blood sugars reach 180 at its highest.   Patient's wife expresses concern in regards to Amiodarone . She reports that pt was previously off of Amiodarone  in the past and required hospitalization 3 months later.   He notes that Dr. Rolinda, his PCP has provided a new BP cup.  He  notes that he will see cardiology PA later this month. Patient may have possible upcoming ablation procedure and he shall let us  know.   Patient denies any upcoming dental procedures.   MEDICAL HISTORY:  1. HTN 2. DM2 3. H/o PAC's 4. transient a fib with cardiac cath 5. HLD 6. CKD stage 3 7. Irritable bowel syndrome 8. GERD 9. H/o presyndope  SURGICAL HISTORY:  1. Prostate bx 2. Cardiac cath  SOCIAL HISTORY: Social History   Socioeconomic History   Marital  status: Married    Spouse name: Not on file   Number of children: 2   Years of education: Not on file   Highest education level: Not on file  Occupational History   Not on file  Tobacco Use   Smoking status: Former    Current packs/day: 0.00    Average packs/day: 1 pack/day for 30.0 years (30.0 ttl pk-yrs)    Types: Cigarettes    Start date: 74    Quit date: 47    Years since quitting: 45.5   Smokeless tobacco: Never   Tobacco comments:    started at age 56  Vaping Use   Vaping status: Never Used  Substance and Sexual Activity   Alcohol use: Never   Drug use: Not Currently   Sexual activity: Not Currently    Birth control/protection: None  Other Topics Concern   Not on file  Social History Narrative   2 Cups of Coffee   Right Handed    Social Drivers of Health   Financial Resource Strain: Low Risk  (05/20/2023)   Overall Financial Resource Strain (CARDIA)    Difficulty of Paying Living Expenses: Not hard at all  Food Insecurity: No Food Insecurity (05/20/2023)   Hunger Vital Sign    Worried About Running Out of Food in the Last Year: Never true    Ran Out of Food in the Last Year: Never true  Transportation Needs: No Transportation Needs (05/20/2023)   PRAPARE - Administrator, Civil Service (Medical): No    Lack of Transportation (Non-Medical): No  Physical Activity: Inactive (05/20/2023)   Exercise Vital Sign    Days of Exercise per Week: 0 days    Minutes of Exercise per Session: 0 min  Stress: No Stress Concern Present (05/20/2023)   [employer] of Occupational Health - Occupational Stress Questionnaire    Feeling of Stress : Only a little  Social Connections: Moderately Integrated (05/20/2023)   Social Connection and Isolation Panel    Frequency of Communication with Friends and Family: More than three times a week    Frequency of Social Gatherings with Friends and Family: Twice a week    Attends Religious Services: More than 4 times  per year    Active Member of [employer] or Organizations: No    Attends Banker Meetings: Never    Marital Status: Married  Catering manager Violence: Not At Risk (05/20/2023)   Humiliation, Afraid, Rape, and Kick questionnaire    Fear of Current or Ex-Partner: No    Emotionally Abused: No    Physically Abused: No    Sexually Abused: No    FAMILY HISTORY: Family History  Problem Relation Age of Onset   Lung cancer Mother    Heart attack Father    Dementia Sister    Diabetes Brother    Diabetes Sister    Heart attack Brother     ALLERGIES:  is allergic to norvasc [amlodipine besylate], augmentin [amoxicillin -pot clavulanate],  bystolic [nebivolol hcl], coq10 [coenzyme q10], cozaar [losartan], farxiga [dapagliflozin], glucotrol [glipizide], januvia [sitagliptin], keflex [cephalexin], lipitor [atorvastatin calcium ], lipitor [atorvastatin], lopid [gemfibrozil], omnicef [cefdinir], prilosec [omeprazole], toprol  xl [metoprolol ], vibra-tab [doxycycline], zestril [lisinopril], zetia [ezetimibe], zocor [simvastatin], and neurontin [gabapentin].  MEDICATIONS:  Current Outpatient Medications  Medication Sig Dispense Refill   acetaminophen  (TYLENOL ) 500 MG tablet Take 1,000 mg by mouth every 8 (eight) hours as needed for moderate pain, fever or headache.     amiodarone  (PACERONE ) 200 MG tablet Take 0.5 tablets (100 mg total) by mouth daily. 45 tablet 1   apixaban  (ELIQUIS ) 5 MG TABS tablet Take 1 tablet (5 mg total) by mouth 2 (two) times daily. 60 tablet 0   ASCORBIC ACID  PO Take 1 tablet by mouth daily. Vitamin C .     cetirizine (ZYRTEC) 10 MG tablet Take 10 mg by mouth daily as needed for allergies.     Cholecalciferol  (VITAMIN D -3 PO) Take 1 capsule by mouth daily.     Cyanocobalamin  (VITAMIN B12) 1000 MCG TBCR Take 1 tablet by mouth daily.     docusate sodium  (COLACE) 100 MG capsule Take 100 mg by mouth daily.      esomeprazole (NEXIUM 24HR) 20 MG capsule Take 20 mg by mouth daily  at 12 noon.     ferrous sulfate  325 (65 FE) MG tablet Take 1 tablet (325 mg total) by mouth every other day. 15 tablet 0   fexofenadine (ALLEGRA) 180 MG tablet Take 180 mg by mouth daily.     FIBER PO Take 3 tablets by mouth daily.     fludrocortisone  (FLORINEF ) 0.1 MG tablet Take 1 tablet (100 mcg total) by mouth every evening. 90 tablet 1   fluticasone  (FLONASE ) 50 MCG/ACT nasal spray Place 1 spray into both nostrils daily as needed for allergies.     glimepiride  (AMARYL ) 2 MG tablet Take 2 mg by mouth daily with breakfast.     metFORMIN  (GLUCOPHAGE ) 500 MG tablet Take 500 mg by mouth 2 (two) times daily with a meal.     midodrine  (PROAMATINE ) 10 MG tablet Take 1 tablet (10 mg total) by mouth 3 (three) times daily with meals. While awake. Do not lay down for 3-4 hours after taking 270 tablet 3   Multiple Vitamins-Minerals (ZINC PO) Take 1 tablet by mouth daily.     ondansetron  (ZOFRAN ) 4 MG tablet Take 1 tablet (4 mg total) by mouth every 8 (eight) hours as needed for nausea or vomiting. 20 tablet 0   Polyethyl Glycol-Propyl Glycol (SYSTANE OP) Place 1 drop into both eyes 2 (two) times daily as needed (dryness, irritation.).     Pyridoxine  HCl (VITAMIN B-6 PO) Take 1 tablet by mouth daily.     rosuvastatin  (CRESTOR ) 5 MG tablet Take 5 mg by mouth every evening.     venetoclax  (VENCLEXTA ) 100 MG tablet Take 1 tablet (100 mg total) by mouth daily. Tablets should be swallowed whole with a meal and a full glass of water. 30 tablet 3   No current facility-administered medications for this visit.    REVIEW OF SYSTEMS:    10 Point review of Systems was done is negative except as noted above.   PHYSICAL EXAMINATION:  Vitals:   11/19/23 1440  BP: 125/72  Pulse: 80  Resp: 18  Temp: 98.1 F (36.7 C)  SpO2: 100%    Filed Weights   11/19/23 1440  Weight: 160 lb 1.6 oz (72.6 kg)   GENERAL:alert, in no acute distress and comfortable SKIN:  no acute rashes, no significant lesions EYES:  conjunctiva are pink and non-injected, sclera anicteric OROPHARYNX: MMM, no exudates, no oropharyngeal erythema or ulceration NECK: supple, no JVD LYMPH:  no palpable lymphadenopathy in the cervical, axillary or inguinal regions LUNGS: clear to auscultation b/l with normal respiratory effort HEART: regular rate & rhythm ABDOMEN:  normoactive bowel sounds , non tender, not distended. Extremity: no pedal edema PSYCH: alert & oriented x 3 with fluent speech NEURO: no focal motor/sensory deficits'   LABORATORY DATA:   I have reviewed the data as listed  .    Latest Ref Rng & Units 11/19/2023    2:07 PM 11/04/2023   12:13 PM 10/28/2023   11:55 AM  CBC  WBC 4.0 - 10.5 K/uL 2.1  5.9  4.4   Hemoglobin 13.0 - 17.0 g/dL 9.8  9.7  9.7   Hematocrit 39.0 - 52.0 % 28.4  29.0  28.6   Platelets 150 - 400 K/uL 62  58  62     . CBC    Component Value Date/Time   WBC 2.1 (L) 11/19/2023 1407   WBC 75.8 (HH) 05/24/2023 1048   RBC 2.97 (L) 11/19/2023 1407   HGB 9.8 (L) 11/19/2023 1407   HGB 7.8 (L) 09/09/2023 1330   HCT 28.4 (L) 11/19/2023 1407   HCT 26.0 (L) 09/09/2023 1330   PLT 62 (L) 11/19/2023 1407   PLT 93 (LL) 09/09/2023 1330   MCV 95.6 11/19/2023 1407   MCV 104 (H) 09/09/2023 1330   MCH 33.0 11/19/2023 1407   MCHC 34.5 11/19/2023 1407   RDW 13.9 11/19/2023 1407   RDW 14.8 09/09/2023 1330   LYMPHSABS 1.3 11/19/2023 1407   MONOABS 0.2 11/19/2023 1407   EOSABS 0.0 11/19/2023 1407   BASOSABS 0.0 11/19/2023 1407   .SABRA Lab Results  Component Value Date   RETICCTPCT 1.4 04/24/2018   RBC 2.92 (L) 11/04/2023       Latest Ref Rng & Units 11/19/2023    2:07 PM 11/04/2023   12:13 PM 10/28/2023   11:55 AM  CMP  Glucose 70 - 99 mg/dL 821  881  855   BUN 8 - 23 mg/dL 19  18  19    Creatinine 0.61 - 1.24 mg/dL 8.73  8.64  8.68   Sodium 135 - 145 mmol/L 134  136  135   Potassium 3.5 - 5.1 mmol/L 4.1  3.8  3.8   Chloride 98 - 111 mmol/L 99  99  101   CO2 22 - 32 mmol/L 30  30  27     Calcium  8.9 - 10.3 mg/dL 9.2  9.7  9.0   Total Protein 6.5 - 8.1 g/dL 6.2  6.1  6.3   Total Bilirubin 0.0 - 1.2 mg/dL 0.5  0.3  0.5   Alkaline Phos 38 - 126 U/L 80  71  78   AST 15 - 41 U/L 15  13  14    ALT 0 - 44 U/L 15  11  10     . Lab Results  Component Value Date   LDH 156 10/28/2023   Component     Latest Ref Rng & Units 06/13/2017  HCV Ab     0.0 - 0.9 s/co ratio <0.1  Hepatitis B Surface Ag     Negative Negative  Hep B Core Ab, Tot     Negative Negative         RADIOGRAPHIC STUDIES: I have personally reviewed the radiological images as listed and agreed with  the findings in the report. CUP PACEART REMOTE DEVICE CHECK Result Date: 11/14/2023 ILR summary report received. Battery status OK. Normal device function. No new symptom, tachy, brady, or pause episodes. No new AF episodes. Monthly summary reports and ROV/PRN ML, CVRS    ASSESSMENT & PLAN:   83 y.o. is a male with  1. Chronic lymphocytic leukemia. Likely Rai Stage 0, CLL FISH panel with 13 q. deletion  -he presented with Lymphocytosis incidentally noted on routine labs No associated significant anemia or thrombocytopenia. No constitutional symptoms No overt clinically palpable LNadenopathy or hepato-splenomegaly. 13q mutation present   08/28/17 CT C/A/P revealed Normal sized spleen, no enlarged lymph nodes. There is evidence of very small lung nodule, likely related to inflammatory process. Will monitor with CT chest in 12 months    PLAN:  -Discussed lab results on 11/19/23  in detail with patient. CBC showed WBC of 2.1K, hemoglobin of 9.8, and platelets of 62K. - WBCs are low. Discussed that this could be related to an immune phenomenon or CLL.  - Platelets improved to 62K - Hgb improving from 7 to 9.8 - neutrophils 0.6 K/uL, which is okay -discussed that the risk of infection is somewhat elevated at this time and counseled on neutropenic precautions -CMP is stable.  -did not feel any  significantly enlarged lymph nodes during physical examination.  -patient is noted to be off of Allopurinol  and Amiodarone  which we discussed are medications that could have been suppressing his bone marrow.  -discussed that venetoclax  can cause mild suppression of the bone marrow.  -he denies any major toxicities from 100 MG venetoclax   -Continue 100 MG venetoclax  -I am not inclined to continue growth factor shots on continuous basis, but if there is concern for infection, this would be a reason for growth factor shot. His low WBCs is likely due to an immune phenomenon, likely related to CLL, and less likely to be broad bone marrow suppression.  - recommend Vitamin B complex once daily - continue Vitamin B6 - continue Vitamin B12  - recommend taking infection precautions - patient shall let us  know if he has upcoming ablation procedure and we will plan to set up growth factor shot accordingly -will repeat labs in 3-4 weeks  FOLLOW-UP: RTC with Dr Onesimo with labs in 4 weeks  The total time spent in the appointment was 25 minutes* .  All of the patient's questions were answered with apparent satisfaction. The patient knows to call the clinic with any problems, questions or concerns.   Emaline Onesimo MD MS AAHIVMS [facility] Hematology/Oncology Physician [facility]  .*Total Encounter Time as defined by the Centers for Medicare and Medicaid Services includes, in addition to the face-to-face time of a patient visit (documented in the note above) non-face-to-face time: obtaining and reviewing outside history, ordering and reviewing medications, tests or procedures, care coordination (communications with other health care professionals or caregivers) and documentation in the medical record.   I,Mitra Faeizi,acting as a Neurosurgeon for Emaline Onesimo, MD.,have documented all relevant documentation on the behalf of Emaline Onesimo, MD,as directed by  Emaline Onesimo, MD while in the presence of Emaline Onesimo, MD.  .I have reviewed the above documentation for accuracy and completeness, and I agree with the above. .Hiba Garry Kishore Nabila Albarracin MD

## 2023-11-27 ENCOUNTER — Other Ambulatory Visit: Payer: Self-pay

## 2023-11-27 NOTE — Progress Notes (Signed)
 Specialty Pharmacy Refill Coordination Note  Eric Harrington is a 83 y.o. male contacted today regarding refills of specialty medication(s) Venetoclax  (VENCLEXTA )   Patient requested Delivery   Delivery date: 11/29/23   Verified address: [address] [place], [place] [address]   Medication will be filled on 11/28/23.

## 2023-11-28 ENCOUNTER — Telehealth: Payer: Self-pay | Admitting: Hematology

## 2023-11-28 NOTE — Telephone Encounter (Signed)
 Spoke with patient confirming upcoming appointment

## 2023-12-01 ENCOUNTER — Encounter: Payer: Self-pay | Admitting: Hematology

## 2023-12-04 NOTE — Progress Notes (Signed)
 Electrophysiology Office Note:   Date:  12/11/2023  ID:  Eric Harrington, DOB May 27, 1940, MRN [id number]  Primary Cardiologist: Gordy Bergamo, MD Primary Heart Failure: None Electrophysiologist: OLE ONEIDA HOLTS, MD      History of Present Illness:   Eric Harrington is a 83 y.o. male with h/o AF, HTN, HLD, orthostatic hypotension, DM, CKD 3a, CLL, thrombocytopenia, & TIA seen today for routine electrophysiology followup.   Since last being seen in our clinic the patient reports he has had a lot of things going on in the recent months. He was taken off amiodarone  by ONC due to concern for contribution of blood dyscrasia. He reports he had one episode this past Sunday (12/08/23) where he felt palpitations and though he was in AF. He was tired the remainder of the day.  His blood pressure has been better since being on Venclexta  for his CLL.  His wife uses his midodrine  and florinef  in a sliding scale pending on his blood pressure.   He denies chest pain, palpitations, dyspnea, PND, orthopnea, nausea, vomiting, dizziness, syncope, edema, weight gain, or early satiety.   Review of systems complete and found to be negative unless listed in HPI.   EP Information / Studies Reviewed:    EKG is not ordered today. EKG from 06/10/23 reviewed which showed NSR 63 bpm, LAFB, RBBB      Arrhythmia / AAD AF  Amiodarone  > stopped 2025 by Dr. Onesimo / Dr. Bergamo due to blood dyscrasia with CLL   Risk Assessment/Calculations:    CHA2DS2-VASc Score = 6   This indicates a 9.7% annual risk of stroke. The patient's score is based upon: CHF History: 0 HTN History: 1 Diabetes History: 1 Stroke History: 2 Vascular Disease History: 0 Age Score: 2 Gender Score: 0             Physical Exam:   VS:  BP 104/60 (BP Location: Right Arm, Patient Position: Standing, Cuff Size: Normal)   Pulse 74   Ht 5' 8 (1.727 m)   Wt 159 lb (72.1 kg)   SpO2 98%   BMI 24.18 kg/m    Wt Readings from Last 3 Encounters:   12/11/23 159 lb (72.1 kg)  11/19/23 160 lb 1.6 oz (72.6 kg)  10/28/23 161 lb 6.4 oz (73.2 kg)     GEN: Well nourished, well developed in no acute distress NECK: No JVD; No carotid bruits CARDIAC: Regular rate and rhythm, no murmurs, rubs, gallops RESPIRATORY:  Clear to auscultation without rales, wheezing or rhonchi  ABDOMEN: Soft, non-tender, non-distended EXTREMITIES:  No edema; No deformity   ASSESSMENT AND PLAN:    Atrial Fibrillation CHA2DS2-VASc 6 -OAC for stroke prophylaxis  -previously seen by Dr. HOLTS for catheter ablation and Watchman, but most recently not felt to be a candidate due to thrombocytopenia & anemia. Only if blood counts significantly improved (Hgb > 8.5-9) & remain stable > could revisit candidacy for Watchman.  -off amiodarone  due to CLL / blood dyscrasia  -[facility] ILR review showed no AF, also checked in clinic given proximity of symptoms and no evidence of AF (set for trigger with AF >6 minutes).  If he did have AF, was very brief.  -will plan for repeat visit with EP APP at the end of October to review Hgb stability. He is pending labs in Aug, if remains stable over ~6 months, will review with Dr. HOLTS for possible consideration for ablation / LAAO  Secondary Hypercoagulable State  -continue Eliquis  5mg  BID,  dose reviewed and appropriate by wt / Cr -monitor Cr closely with CKD   CLL  Pancytopenia  -per ONC   Follow up with Dr. Cindie / EP APP at the end of October   Signed, Daphne Barrack, NP-C, AGACNP-BC [facility] - Electrophysiology  12/11/2023, 4:29 PM

## 2023-12-05 NOTE — Addendum Note (Signed)
 Addended by: TAWNI DRILLING D on: 12/05/2023 03:30 PM   Modules accepted: Orders

## 2023-12-05 NOTE — Progress Notes (Signed)
Merlin loop Recorder 

## 2023-12-11 ENCOUNTER — Ambulatory Visit: Attending: Cardiovascular Disease | Admitting: Pulmonary Disease

## 2023-12-11 ENCOUNTER — Encounter: Payer: Self-pay | Admitting: Pulmonary Disease

## 2023-12-11 VITALS — BP 104/60 | HR 74 | Ht 68.0 in | Wt 159.0 lb

## 2023-12-11 DIAGNOSIS — D6869 Other thrombophilia: Secondary | ICD-10-CM | POA: Diagnosis not present

## 2023-12-11 DIAGNOSIS — I480 Paroxysmal atrial fibrillation: Secondary | ICD-10-CM | POA: Diagnosis not present

## 2023-12-11 NOTE — Patient Instructions (Signed)
 Medication Instructions:  Your physician recommends that you continue on your current medications as directed. Please refer to the Current Medication list given to you today.  *If you need a refill on your cardiac medications before your next appointment, please call your pharmacy*  Lab Work: None ordered If you have labs (blood work) drawn today and your tests are completely normal, you will receive your results only by: MyChart Message (if you have MyChart) OR A paper copy in the mail If you have any lab test that is abnormal or we need to change your treatment, we will call you to review the results.  Follow-Up: At [facility], you and your health needs are our priority.  As part of our continuing mission to provide you with exceptional heart care, our providers are all part of one team.  This team includes your primary Cardiologist (physician) and Advanced Practice Providers or APPs (Physician Assistants and Nurse Practitioners) who all work together to provide you with the care you need, when you need it.  Your next appointment:   End of October  Provider:   Daphne Barrack, NP

## 2023-12-16 ENCOUNTER — Ambulatory Visit (INDEPENDENT_AMBULATORY_CARE_PROVIDER_SITE_OTHER)

## 2023-12-16 DIAGNOSIS — I480 Paroxysmal atrial fibrillation: Secondary | ICD-10-CM | POA: Diagnosis not present

## 2023-12-17 LAB — CUP PACEART REMOTE DEVICE CHECK
Date Time Interrogation Session: 20250728050509
Implantable Pulse Generator Implant Date: 20240716
Pulse Gen Model: 5000
Pulse Gen Serial Number: 511029620

## 2023-12-19 ENCOUNTER — Other Ambulatory Visit: Payer: Self-pay

## 2023-12-19 ENCOUNTER — Ambulatory Visit: Payer: Self-pay | Admitting: Cardiology

## 2023-12-19 DIAGNOSIS — C9110 Chronic lymphocytic leukemia of B-cell type not having achieved remission: Secondary | ICD-10-CM

## 2023-12-19 NOTE — Progress Notes (Signed)
 Specialty Pharmacy Refill Coordination Note  Eric Harrington is a 83 y.o. male contacted today regarding refills of specialty medication(s) Venetoclax  (VENCLEXTA )   Patient requested Delivery   Delivery date: 12/25/23   Verified address: [address] [place], [place] [address]   Medication will be filled on 12/24/23.

## 2023-12-19 NOTE — Progress Notes (Signed)
 HEMATOLOGY ONCOLOGY CLINIC NOTE  Date of Service: 12/20/23   Patient Care Team: Rolinda Millman, MD as PCP - General (Family Medicine) Ladona Heinz, MD as PCP - Cardiology (Cardiology) Cindie Ole DASEN, MD as PCP - Electrophysiology (Cardiology) Onesimo Emaline Brink, MD as Consulting Physician (Hematology)  CHIEF COMPLAINTS/PURPOSE OF CONSULTATION:  Continued evaluation and management of CLL  HISTORY OF PRESENTING ILLNESS:   Eric Harrington 83 y.o. male is here because of a referral from Dr. Camille from [facility] regarding a trend in his elevated WBC.   He is accompanied today by his wife of 54 years. The pt reports that he is doing well overall. He recently had a biopsy to check his prostate at [facility] with Dr. Medford Han and they took 12 core samples with reportedly no significant findings.  Of note prior to the patient's visit today, pt has had CBC completed on 04/26/17 with results revealing WBC at 14.7, Hgb at 12.6, Lymph Abs at 11.0 with all other values WNL. On 03/26/17 his WBC were 12.8, Hgb at 13.3 and Lymph's at  9.90. On 02/06/17 his WBC were 12.7, Hgb at 13.4, and Lymph's at 8.80.   On review of systems, pt reports post nasal drip, persistent cough, occasional troublesome stomach, pain in his lower abdomen that feels like his muscles are irritated, reports he has lost 25 lbs in the last 2.5 years (he associates a decreased appetite after taking beta blockers during this period) and denies no recent colds or infections, acute changes in energy levels, and leg swelling.   On PMHx the pt reports taking Zetia. He has IB'S and has had 3 colonoscopies with no significant findings or inflammatory processes. He reports that over the last 2.5 years he had heart catheterizations that all turned out well. He reports having diabetes. He also reports neuropathy along his whole right side that lasted for a few months that ceased abruptly.   Interval History:   Eric Harrington is a wonderful 83 y.o. gentleman who is here for continued evaluation and management of CLL.   Patient was last seen by me on 11/19/2023 and reported fluctuating appetite, one occasion of mild nausea with low blood pressure, back pain and foot swelling sometimes, and  frequent sweats inside the home. He also noted previous constant stomach ache which improved following cessation of Metformin .  He presents with a cane and is accompanied by his wife during today's visit. Patient complains of constant fatigue, and notes particularly feeling poorly yesterday.   He reports fatigue, which is worse some mornings. His wife notes that he typically feels poorly when his blood pressure is low in the mornings.   His wife notes that he was able to attend church for the first time last Sunday, which was the first time he has gone in a couple of months.   Patient reports having a short episode of atrial fibrillation 3 weeks ago. Patient reports that his episodes of feeling poorly seems to be correlating with afib.   Patient's wife reports being told that there are plans for possible ablation once his labs are stable for 6 months.   Patient is not on amiodarone  at this time. His wife notes that patient was previously off of Amiodarone  in the past, and after 3 months, he had an afib episode.   His wife notes that patient was previously on Glimepiride , which has been stopped. Patient is currently taking Glipizide.   Patient is on Florinef   as needed managed by Dr. Ladona. He notes that he has not used Florinef  in 2-3 weeks. He is also on Midodrine  as needed. His wife notes that patient takes 5 MG midodrine  some days. Patient has not been on metformin  for 2 months.   His wife notes that his blood pressure was very high in the afternoon on a couple occasions.   He notes that his coffee intake has been limited to some extent.   Patient reports mild tenderness in his left cervical lymph node.   His wife notes  that patient's night sweats have improved.   Patient reports urinary frequency during the night.  Patient has no back pain at this time. His wife notes that patient did take some Tylenol  to manage back pain.   He complains of shoulder pain R>L.   Patient notes having taste and smell issues since his previous COVID-19 infection.   He report that he regularly stays hydrated and denies too much heat exposure. Patient reports that he regularly consumes seven 8oz bottles of water daily. His wife notes he does have some salt in the diet. Patient notes having a lot of sugar in the diet.   Patient notes some previous abdominal cramping. He denies any abdominal pain or leg swelling at this time.   He reports that his cervical lymph nodes have decreased in size and notes that he only has a few at this time.   He notes an episode of mild right foot swelling on one occasion, which lasted 1-2 days. Patient otherwise denies any leg swelling. He continues to regularly use compression socks.   MEDICAL HISTORY:  1. HTN 2. DM2 3. H/o PAC's 4. transient a fib with cardiac cath 5. HLD 6. CKD stage 3 7. Irritable bowel syndrome 8. GERD 9. H/o presyndope  SURGICAL HISTORY:  1. Prostate bx 2. Cardiac cath  SOCIAL HISTORY: Social History   Socioeconomic History   Marital status: Married    Spouse name: Not on file   Number of children: 2   Years of education: Not on file   Highest education level: Not on file  Occupational History   Not on file  Tobacco Use   Smoking status: Former    Current packs/day: 0.00    Average packs/day: 1 pack/day for 30.0 years (30.0 ttl pk-yrs)    Types: Cigarettes    Start date: 56    Quit date: 32    Years since quitting: 45.6   Smokeless tobacco: Never   Tobacco comments:    started at age 64  Vaping Use   Vaping status: Never Used  Substance and Sexual Activity   Alcohol use: Never   Drug use: Not Currently   Sexual activity: Not Currently     Birth control/protection: None  Other Topics Concern   Not on file  Social History Narrative   2 Cups of Coffee   Right Handed    Social Drivers of Health   Financial Resource Strain: Low Risk  (05/20/2023)   Overall Financial Resource Strain (CARDIA)    Difficulty of Paying Living Expenses: Not hard at all  Food Insecurity: No Food Insecurity (05/20/2023)   Hunger Vital Sign    Worried About Running Out of Food in the Last Year: Never true    Ran Out of Food in the Last Year: Never true  Transportation Needs: No Transportation Needs (05/20/2023)   PRAPARE - Transportation    Lack of Transportation (Medical): No    Lack of  Transportation (Non-Medical): No  Physical Activity: Inactive (05/20/2023)   Exercise Vital Sign    Days of Exercise per Week: 0 days    Minutes of Exercise per Session: 0 min  Stress: No Stress Concern Present (05/20/2023)   [employer] of Occupational Health - Occupational Stress Questionnaire    Feeling of Stress : Only a little  Social Connections: Moderately Integrated (05/20/2023)   Social Connection and Isolation Panel    Frequency of Communication with Friends and Family: More than three times a week    Frequency of Social Gatherings with Friends and Family: Twice a week    Attends Religious Services: More than 4 times per year    Active Member of [employer] or Organizations: No    Attends Banker Meetings: Never    Marital Status: Married  Catering manager Violence: Not At Risk (05/20/2023)   Humiliation, Afraid, Rape, and Kick questionnaire    Fear of Current or Ex-Partner: No    Emotionally Abused: No    Physically Abused: No    Sexually Abused: No    FAMILY HISTORY: Family History  Problem Relation Age of Onset   Lung cancer Mother    Heart attack Father    Dementia Sister    Diabetes Brother    Diabetes Sister    Heart attack Brother     ALLERGIES:  is allergic to norvasc [amlodipine besylate], augmentin  [amoxicillin -pot clavulanate], bystolic [nebivolol hcl], coq10 [coenzyme q10], cozaar [losartan], farxiga [dapagliflozin], glucotrol [glipizide], januvia [sitagliptin], keflex [cephalexin], lipitor [atorvastatin calcium ], lipitor [atorvastatin], lopid [gemfibrozil], metformin , omnicef [cefdinir], prilosec [omeprazole], toprol  xl [metoprolol ], vibra-tab [doxycycline], zestril [lisinopril], zetia [ezetimibe], zocor [simvastatin], and neurontin [gabapentin].  MEDICATIONS:  Current Outpatient Medications  Medication Sig Dispense Refill   acetaminophen  (TYLENOL ) 500 MG tablet Take 1,000 mg by mouth every 8 (eight) hours as needed for moderate pain, fever or headache.     apixaban  (ELIQUIS ) 5 MG TABS tablet Take 1 tablet (5 mg total) by mouth 2 (two) times daily. 60 tablet 0   ASCORBIC ACID  PO Take 1 tablet by mouth daily. Vitamin C .     cetirizine (ZYRTEC) 10 MG tablet Take 10 mg by mouth daily as needed for allergies.     Cholecalciferol  (VITAMIN D -3 PO) Take 1 capsule by mouth daily.     Cyanocobalamin  (VITAMIN B12) 1000 MCG TBCR Take 1 tablet by mouth daily.     docusate sodium  (COLACE) 100 MG capsule Take 100 mg by mouth daily.      esomeprazole (NEXIUM 24HR) 20 MG capsule Take 20 mg by mouth daily at 12 noon. (Patient taking differently: Take 20 mg by mouth as needed.)     ferrous sulfate  325 (65 FE) MG tablet Take 1 tablet (325 mg total) by mouth every other day. (Patient not taking: Reported on 12/11/2023) 15 tablet 0   fexofenadine (ALLEGRA) 180 MG tablet Take 180 mg by mouth daily. (Patient taking differently: Take 180 mg by mouth as needed for allergies or rhinitis.)     FIBER PO Take 3 tablets by mouth daily.     fludrocortisone  (FLORINEF ) 0.1 MG tablet Take 1 tablet (100 mcg total) by mouth every evening. (Patient taking differently: Take 100 mcg by mouth as needed.) 90 tablet 1   fluticasone  (FLONASE ) 50 MCG/ACT nasal spray Place 1 spray into both nostrils daily as needed for allergies.      glimepiride  (AMARYL ) 2 MG tablet Take 2 mg by mouth daily with breakfast. (Patient not taking: Reported on 12/11/2023)  glipiZIDE (GLUCOTROL XL) 10 MG 24 hr tablet Take 10 mg by mouth daily.     metFORMIN  (GLUCOPHAGE ) 500 MG tablet Take 500 mg by mouth 2 (two) times daily with a meal. (Patient not taking: Reported on 12/11/2023)     midodrine  (PROAMATINE ) 10 MG tablet Take 1 tablet (10 mg total) by mouth 3 (three) times daily with meals. While awake. Do not lay down for 3-4 hours after taking 270 tablet 3   Multiple Vitamins-Minerals (ZINC PO) Take 1 tablet by mouth daily.     ondansetron  (ZOFRAN ) 4 MG tablet Take 1 tablet (4 mg total) by mouth every 8 (eight) hours as needed for nausea or vomiting. 20 tablet 0   Polyethyl Glycol-Propyl Glycol (SYSTANE OP) Place 1 drop into both eyes 2 (two) times daily as needed (dryness, irritation.).     Pyridoxine  HCl (VITAMIN B-6 PO) Take 1 tablet by mouth daily. (Patient not taking: Reported on 12/11/2023)     rosuvastatin  (CRESTOR ) 5 MG tablet Take 5 mg by mouth every evening.     venetoclax  (VENCLEXTA ) 100 MG tablet Take 1 tablet (100 mg total) by mouth daily. Tablets should be swallowed whole with a meal and a full glass of water. 30 tablet 3   No current facility-administered medications for this visit.    REVIEW OF SYSTEMS:    10 Point review of Systems was done is negative except as noted above.   PHYSICAL EXAMINATION:  Vitals:   12/20/23 1535  BP: (!) 148/85  Pulse: 69  Resp: 18  Temp: (!) 97.5 F (36.4 C)  SpO2: 100%     Filed Weights   12/20/23 1535  Weight: 162 lb 4 oz (73.6 kg)    GENERAL:alert, in no acute distress and comfortable SKIN: no acute rashes, no significant lesions EYES: conjunctiva are pink and non-injected, sclera anicteric OROPHARYNX: MMM, no exudates, no oropharyngeal erythema or ulceration NECK: supple, no JVD LYMPH:  no palpable lymphadenopathy in the cervical, axillary or inguinal regions LUNGS: clear to  auscultation b/l with normal respiratory effort HEART: regular rate & rhythm ABDOMEN:  normoactive bowel sounds , non tender, not distended. Extremity: no pedal edema PSYCH: alert & oriented x 3 with fluent speech NEURO: no focal motor/sensory deficits   LABORATORY DATA:   I have reviewed the data as listed  .    Latest Ref Rng & Units 12/26/2023    8:13 PM 12/20/2023    2:53 PM 11/19/2023    2:07 PM  CBC  WBC 4.0 - 10.5 K/uL 4.0  4.2  2.1   Hemoglobin 13.0 - 17.0 g/dL 88.9  88.0  9.8   Hematocrit 39.0 - 52.0 % 32.1  33.7  28.4   Platelets 150 - 400 K/uL 62  64  62     . CBC    Component Value Date/Time   WBC 2.1 (L) 11/19/2023 1407   WBC 75.8 (HH) 05/24/2023 1048   RBC 2.97 (L) 11/19/2023 1407   HGB 9.8 (L) 11/19/2023 1407   HGB 7.8 (L) 09/09/2023 1330   HCT 28.4 (L) 11/19/2023 1407   HCT 26.0 (L) 09/09/2023 1330   PLT 62 (L) 11/19/2023 1407   PLT 93 (LL) 09/09/2023 1330   MCV 95.6 11/19/2023 1407   MCV 104 (H) 09/09/2023 1330   MCH 33.0 11/19/2023 1407   MCHC 34.5 11/19/2023 1407   RDW 13.9 11/19/2023 1407   RDW 14.8 09/09/2023 1330   LYMPHSABS 1.3 11/19/2023 1407   MONOABS 0.2 11/19/2023 1407  EOSABS 0.0 11/19/2023 1407   BASOSABS 0.0 11/19/2023 1407   .SABRA Lab Results  Component Value Date   RETICCTPCT 1.4 04/24/2018   RBC 2.97 (L) 11/19/2023       Latest Ref Rng & Units 12/26/2023    8:13 PM 12/20/2023    2:53 PM 11/19/2023    2:07 PM  CMP  Glucose 70 - 99 mg/dL 88  885  821   BUN 8 - 23 mg/dL 26  21  19    Creatinine 0.61 - 1.24 mg/dL 8.63  8.62  8.73   Sodium 135 - 145 mmol/L 134  133  134   Potassium 3.5 - 5.1 mmol/L 4.2  4.1  4.1   Chloride 98 - 111 mmol/L 103  98  99   CO2 22 - 32 mmol/L 27  29  30    Calcium  8.9 - 10.3 mg/dL 9.3  9.5  9.2   Total Protein 6.5 - 8.1 g/dL 6.0  6.7  6.2   Total Bilirubin 0.0 - 1.2 mg/dL 0.5  0.6  0.5   Alkaline Phos 38 - 126 U/L 57  73  80   AST 15 - 41 U/L 20  19  15    ALT 0 - 44 U/L 19  20  15   30  . Lab Results   Component Value Date   LDH 146 11/19/2023   Component     Latest Ref Rng & Units 06/13/2017  HCV Ab     0.0 - 0.9 s/co ratio <0.1  Hepatitis B Surface Ag     Negative Negative  Hep B Core Ab, Tot     Negative Negative         RADIOGRAPHIC STUDIES: I have personally reviewed the radiological images as listed and agreed with the findings in the report. CUP PACEART REMOTE DEVICE CHECK Result Date: 12/17/2023 ILR summary report received. Battery status OK. Normal device function. No new symptom, tachy, brady, or pause episodes. No new AF episodes. Monthly summary reports and ROV/PRN AB, CVRS    ASSESSMENT & PLAN:   83 y.o. is a male with  1. Chronic lymphocytic leukemia. Likely Rai Stage 0, CLL FISH panel with 13 q. deletion  -he presented with Lymphocytosis incidentally noted on routine labs No associated significant anemia or thrombocytopenia. No constitutional symptoms No overt clinically palpable LNadenopathy or hepato-splenomegaly. 13q mutation present   08/28/17 CT C/A/P revealed Normal sized spleen, no enlarged lymph nodes. There is evidence of very small lung nodule, likely related to inflammatory process. Will monitor with CT chest in 12 months    PLAN:  -Discussed lab results on 12/20/23 in detail with patient. CBC showed WBC of 4.2K, hemoglobin of 11.9, and platelets of 64K. -discussed that his treatment for CLL is improving his blood counts -WBCS normalized from 2.1K to 4.2K -neutrophils normalized from 600 to 1600 -PLTs stable/improved -HGB improved from 9.8 to nearly 12 -discussed that improved anemia takes stress off of the heart as well -WBCS and HGB are not concerning -discussed that there is a need to determine the cause of his low blood pressure and fatigue. This is unlikely to be primarily from CLL or venetoclax .  -we discussed that his fatigue can be from a combination of factors. His fatigue is not primarily from Venetoclax . Other possible causes  include anemia, cardiac history, hydration status, nutritional status, and activity level -we discussed that there is no indication for blood transfusion at this time given his normal hgb -discussed that his afib  is not from venetoclax . It is possible that afib could be from amiodarone  or CLL.  -he denies any major toxicities from 100 MG venetoclax   -discussed that since there are considerations of possible ablation and potential considerations of possibly starting Amiodarone , we will not increase Venetoclax  dose at this time -we will continue venetoclax  at current dose of 100 MG at this time -discussed that afib could potentially be triggered by sleep apnea, abnormal electrolytes, dehydration, or frequent intake of caffeine, though there is not always a need for an obvious trigger.  -recommend to connect with cardiologist, Dr. Ladona, to discuss whether there are plans to restart amiodarone  if needed for afib. -discussed that if there are considerations of an ablation, there would be a need for PLT of at least 50K -educated patient that florinef  works by holding on to more salt and water, increasing blood pressure.  -discussed that if he is feeling weak, he may need to liberalize some of his salt intake to some extent if he has no salt intake at all, and there may then be less of a role for Florinef  or Midodrine  to bring up his blood pressure.  -discussed that metformin  is known to cause bloating and abdominal cramping -we discussed that there are not very many obvious cervical lymph nodes based on physical exam at this time. We discussed that even his axillary lymph nodes are very small and not easily palpable.  -recommend eating as well as he can to optimize nutrition  -continue to stay well hydrated -recommend staying physically active to support muscle mass  -recommend to continue to use compression socks to keep blood from pooling in the legs -recommend moving legs around before standing to  help with stabilization when standing -he shall return to clinic in 2 months with repeat labs  -answered all of patient's and his wife's questions in detail  FOLLOW-UP: RTC with Dr Onesimo with labs in 2 months  The total time spent in the appointment was 30 minutes* .  All of the patient's questions were answered with apparent satisfaction. The patient knows to call the clinic with any problems, questions or concerns.   Emaline Onesimo MD MS AAHIVMS [facility] Hematology/Oncology Physician [facility]  .*Total Encounter Time as defined by the Centers for Medicare and Medicaid Services includes, in addition to the face-to-face time of a patient visit (documented in the note above) non-face-to-face time: obtaining and reviewing outside history, ordering and reviewing medications, tests or procedures, care coordination (communications with other health care professionals or caregivers) and documentation in the medical record.    I,Mitra Faeizi,acting as a Neurosurgeon for Emaline Onesimo, MD.,have documented all relevant documentation on the behalf of Emaline Onesimo, MD,as directed by  Emaline Onesimo, MD while in the presence of Emaline Onesimo, MD.  .I have reviewed the above documentation for accuracy and completeness, and I agree with the above. .Amed Datta Kishore Shota Kohrs MD

## 2023-12-20 ENCOUNTER — Inpatient Hospital Stay (HOSPITAL_BASED_OUTPATIENT_CLINIC_OR_DEPARTMENT_OTHER): Admitting: Hematology

## 2023-12-20 ENCOUNTER — Inpatient Hospital Stay: Attending: Hematology

## 2023-12-20 VITALS — BP 148/85 | HR 69 | Temp 97.5°F | Resp 18 | Wt 162.2 lb

## 2023-12-20 DIAGNOSIS — E1122 Type 2 diabetes mellitus with diabetic chronic kidney disease: Secondary | ICD-10-CM | POA: Insufficient documentation

## 2023-12-20 DIAGNOSIS — R350 Frequency of micturition: Secondary | ICD-10-CM | POA: Diagnosis not present

## 2023-12-20 DIAGNOSIS — Z7969 Long term (current) use of other immunomodulators and immunosuppressants: Secondary | ICD-10-CM | POA: Diagnosis not present

## 2023-12-20 DIAGNOSIS — R1030 Lower abdominal pain, unspecified: Secondary | ICD-10-CM | POA: Diagnosis not present

## 2023-12-20 DIAGNOSIS — I4891 Unspecified atrial fibrillation: Secondary | ICD-10-CM | POA: Insufficient documentation

## 2023-12-20 DIAGNOSIS — I129 Hypertensive chronic kidney disease with stage 1 through stage 4 chronic kidney disease, or unspecified chronic kidney disease: Secondary | ICD-10-CM | POA: Insufficient documentation

## 2023-12-20 DIAGNOSIS — M7989 Other specified soft tissue disorders: Secondary | ICD-10-CM | POA: Insufficient documentation

## 2023-12-20 DIAGNOSIS — Z7901 Long term (current) use of anticoagulants: Secondary | ICD-10-CM | POA: Insufficient documentation

## 2023-12-20 DIAGNOSIS — D649 Anemia, unspecified: Secondary | ICD-10-CM

## 2023-12-20 DIAGNOSIS — Z8616 Personal history of COVID-19: Secondary | ICD-10-CM | POA: Insufficient documentation

## 2023-12-20 DIAGNOSIS — R5383 Other fatigue: Secondary | ICD-10-CM | POA: Diagnosis not present

## 2023-12-20 DIAGNOSIS — Z79899 Other long term (current) drug therapy: Secondary | ICD-10-CM | POA: Insufficient documentation

## 2023-12-20 DIAGNOSIS — R053 Chronic cough: Secondary | ICD-10-CM | POA: Diagnosis not present

## 2023-12-20 DIAGNOSIS — R110 Nausea: Secondary | ICD-10-CM | POA: Diagnosis not present

## 2023-12-20 DIAGNOSIS — M549 Dorsalgia, unspecified: Secondary | ICD-10-CM | POA: Diagnosis not present

## 2023-12-20 DIAGNOSIS — Z7984 Long term (current) use of oral hypoglycemic drugs: Secondary | ICD-10-CM | POA: Insufficient documentation

## 2023-12-20 DIAGNOSIS — R911 Solitary pulmonary nodule: Secondary | ICD-10-CM | POA: Diagnosis not present

## 2023-12-20 DIAGNOSIS — C9110 Chronic lymphocytic leukemia of B-cell type not having achieved remission: Secondary | ICD-10-CM | POA: Insufficient documentation

## 2023-12-20 DIAGNOSIS — Z87891 Personal history of nicotine dependence: Secondary | ICD-10-CM | POA: Diagnosis not present

## 2023-12-20 DIAGNOSIS — D696 Thrombocytopenia, unspecified: Secondary | ICD-10-CM | POA: Diagnosis not present

## 2023-12-20 DIAGNOSIS — K589 Irritable bowel syndrome without diarrhea: Secondary | ICD-10-CM | POA: Diagnosis not present

## 2023-12-20 DIAGNOSIS — E785 Hyperlipidemia, unspecified: Secondary | ICD-10-CM | POA: Diagnosis not present

## 2023-12-20 DIAGNOSIS — R0982 Postnasal drip: Secondary | ICD-10-CM | POA: Insufficient documentation

## 2023-12-20 DIAGNOSIS — K219 Gastro-esophageal reflux disease without esophagitis: Secondary | ICD-10-CM | POA: Diagnosis not present

## 2023-12-20 LAB — CBC WITH DIFFERENTIAL (CANCER CENTER ONLY)
Abs Immature Granulocytes: 0.01 K/uL (ref 0.00–0.07)
Basophils Absolute: 0 K/uL (ref 0.0–0.1)
Basophils Relative: 1 %
Eosinophils Absolute: 0 K/uL (ref 0.0–0.5)
Eosinophils Relative: 0 %
HCT: 33.7 % — ABNORMAL LOW (ref 39.0–52.0)
Hemoglobin: 11.9 g/dL — ABNORMAL LOW (ref 13.0–17.0)
Immature Granulocytes: 0 %
Lymphocytes Relative: 47 %
Lymphs Abs: 2 K/uL (ref 0.7–4.0)
MCH: 32.3 pg (ref 26.0–34.0)
MCHC: 35.3 g/dL (ref 30.0–36.0)
MCV: 91.6 fL (ref 80.0–100.0)
Monocytes Absolute: 0.6 K/uL (ref 0.1–1.0)
Monocytes Relative: 14 %
Neutro Abs: 1.6 K/uL — ABNORMAL LOW (ref 1.7–7.7)
Neutrophils Relative %: 38 %
Platelet Count: 64 K/uL — ABNORMAL LOW (ref 150–400)
RBC: 3.68 MIL/uL — ABNORMAL LOW (ref 4.22–5.81)
RDW: 13.1 % (ref 11.5–15.5)
WBC Count: 4.2 K/uL (ref 4.0–10.5)
nRBC: 0 % (ref 0.0–0.2)

## 2023-12-20 LAB — CMP (CANCER CENTER ONLY)
ALT: 20 U/L (ref 0–44)
AST: 19 U/L (ref 15–41)
Albumin: 4.6 g/dL (ref 3.5–5.0)
Alkaline Phosphatase: 73 U/L (ref 38–126)
Anion gap: 6 (ref 5–15)
BUN: 21 mg/dL (ref 8–23)
CO2: 29 mmol/L (ref 22–32)
Calcium: 9.5 mg/dL (ref 8.9–10.3)
Chloride: 98 mmol/L (ref 98–111)
Creatinine: 1.37 mg/dL — ABNORMAL HIGH (ref 0.61–1.24)
GFR, Estimated: 52 mL/min — ABNORMAL LOW (ref >60)
Glucose, Bld: 114 mg/dL — ABNORMAL HIGH (ref 70–99)
Potassium: 4.1 mmol/L (ref 3.5–5.1)
Sodium: 133 mmol/L — ABNORMAL LOW (ref 135–145)
Total Bilirubin: 0.6 mg/dL (ref 0.0–1.2)
Total Protein: 6.7 g/dL (ref 6.5–8.1)

## 2023-12-20 LAB — SAMPLE TO BLOOD BANK

## 2023-12-20 LAB — LACTATE DEHYDROGENASE: LDH: 144 U/L (ref 98–192)

## 2023-12-24 ENCOUNTER — Other Ambulatory Visit

## 2023-12-24 ENCOUNTER — Other Ambulatory Visit: Payer: Self-pay

## 2023-12-24 ENCOUNTER — Ambulatory Visit: Admitting: Hematology

## 2023-12-26 ENCOUNTER — Encounter: Payer: Self-pay | Admitting: Hematology

## 2023-12-26 ENCOUNTER — Emergency Department (HOSPITAL_COMMUNITY)
Admission: EM | Admit: 2023-12-26 | Discharge: 2023-12-27 | Disposition: A | Discharge status: Home / Self Care | Attending: Emergency Medicine | Admitting: Emergency Medicine

## 2023-12-26 ENCOUNTER — Emergency Department (HOSPITAL_COMMUNITY)

## 2023-12-26 DIAGNOSIS — E1122 Type 2 diabetes mellitus with diabetic chronic kidney disease: Secondary | ICD-10-CM | POA: Diagnosis not present

## 2023-12-26 DIAGNOSIS — I129 Hypertensive chronic kidney disease with stage 1 through stage 4 chronic kidney disease, or unspecified chronic kidney disease: Secondary | ICD-10-CM | POA: Insufficient documentation

## 2023-12-26 DIAGNOSIS — I4891 Unspecified atrial fibrillation: Secondary | ICD-10-CM | POA: Diagnosis not present

## 2023-12-26 DIAGNOSIS — Z8673 Personal history of transient ischemic attack (TIA), and cerebral infarction without residual deficits: Secondary | ICD-10-CM | POA: Insufficient documentation

## 2023-12-26 DIAGNOSIS — R200 Anesthesia of skin: Secondary | ICD-10-CM | POA: Insufficient documentation

## 2023-12-26 DIAGNOSIS — N189 Chronic kidney disease, unspecified: Secondary | ICD-10-CM | POA: Diagnosis not present

## 2023-12-26 DIAGNOSIS — Z7901 Long term (current) use of anticoagulants: Secondary | ICD-10-CM | POA: Insufficient documentation

## 2023-12-26 DIAGNOSIS — Z7984 Long term (current) use of oral hypoglycemic drugs: Secondary | ICD-10-CM | POA: Insufficient documentation

## 2023-12-26 DIAGNOSIS — Z79899 Other long term (current) drug therapy: Secondary | ICD-10-CM | POA: Insufficient documentation

## 2023-12-26 DIAGNOSIS — R202 Paresthesia of skin: Secondary | ICD-10-CM | POA: Insufficient documentation

## 2023-12-26 LAB — CBC WITH DIFFERENTIAL/PLATELET
Basophils Absolute: 0 K/uL (ref 0.0–0.1)
Basophils Relative: 0 %
Eosinophils Absolute: 0 K/uL (ref 0.0–0.5)
Eosinophils Relative: 1 %
HCT: 32.1 % — ABNORMAL LOW (ref 39.0–52.0)
Hemoglobin: 11 g/dL — ABNORMAL LOW (ref 13.0–17.0)
Lymphocytes Relative: 49 %
Lymphs Abs: 2 K/uL (ref 0.7–4.0)
MCH: 32.2 pg (ref 26.0–34.0)
MCHC: 34.3 g/dL (ref 30.0–36.0)
MCV: 93.9 fL (ref 80.0–100.0)
Monocytes Absolute: 0.2 K/uL (ref 0.1–1.0)
Monocytes Relative: 5 %
Neutro Abs: 1.8 K/uL (ref 1.7–7.7)
Neutrophils Relative %: 44 %
Platelets: 62 K/uL — ABNORMAL LOW (ref 150–400)
RBC: 3.42 MIL/uL — ABNORMAL LOW (ref 4.22–5.81)
RDW: 12.7 % (ref 11.5–15.5)
WBC: 4 K/uL (ref 4.0–10.5)
nRBC: 0 % (ref 0.0–0.2)

## 2023-12-26 LAB — COMPREHENSIVE METABOLIC PANEL WITH GFR
ALT: 19 U/L (ref 0–44)
AST: 20 U/L (ref 15–41)
Albumin: 3.9 g/dL (ref 3.5–5.0)
Alkaline Phosphatase: 57 U/L (ref 38–126)
Anion gap: 4 — ABNORMAL LOW (ref 5–15)
BUN: 26 mg/dL — ABNORMAL HIGH (ref 8–23)
CO2: 27 mmol/L (ref 22–32)
Calcium: 9.3 mg/dL (ref 8.9–10.3)
Chloride: 103 mmol/L (ref 98–111)
Creatinine, Ser: 1.36 mg/dL — ABNORMAL HIGH (ref 0.61–1.24)
GFR, Estimated: 52 mL/min — ABNORMAL LOW (ref >60)
Glucose, Bld: 88 mg/dL (ref 70–99)
Potassium: 4.2 mmol/L (ref 3.5–5.1)
Sodium: 134 mmol/L — ABNORMAL LOW (ref 135–145)
Total Bilirubin: 0.5 mg/dL (ref 0.0–1.2)
Total Protein: 6 g/dL — ABNORMAL LOW (ref 6.5–8.1)

## 2023-12-26 LAB — CBG MONITORING, ED: Glucose-Capillary: 79 mg/dL (ref 70–99)

## 2023-12-26 LAB — MAGNESIUM: Magnesium: 1.9 mg/dL (ref 1.7–2.4)

## 2023-12-26 MED ORDER — ACETAMINOPHEN 325 MG PO TABS
650.0000 mg | ORAL_TABLET | Freq: Once | ORAL | Status: DC
  Filled 2023-12-26: qty 2

## 2023-12-26 NOTE — ED Triage Notes (Signed)
 Pt c/o right sided numbness that began in his right shoulder and radiated down to his right hand. Pt states this started around an hour ago. Hx of TIA, afib on eliquis . Pt also states he endorses right sided HA x 3 days.

## 2023-12-26 NOTE — ED Provider Notes (Signed)
 [place] EMERGENCY DEPARTMENT AT [facility] Provider Note   CSN: [id number] Arrival date & time: 12/26/23  1902     Patient presents with: Numbness   Eric Harrington is a 83 y.o. male PMH cervical spondylosis with myelopathy and radiculopathy followed by NSGY with two-level anterior cervical decompression and fusion for treatment of his compressive cervical myelopathy 2024, hx TIA followed by neurology, Afib on Eliquis , CLL on Venetoclax , HTN, HLD, CKD, TIIDM who presents to the ED for transient episode of left upper extremity and left facial numbness.  Patient states that approximately 1 hour ago he experienced a 10-minute episode of right upper extremity numbness as well as right lip numbness.  Patient states that these symptoms have completely resolved and he has not had recurrence or new symptoms.  Patient denies falls or trauma.  Patient does endorse associated mild headache over the past 3 days.  Patient denies associated fever, chest pain, shortness of breath, syncope.   HPI     Prior to Admission medications   Medication Sig Start Date End Date Taking? Authorizing Provider  acetaminophen  (TYLENOL ) 500 MG tablet Take 1,000 mg by mouth every 8 (eight) hours as needed for moderate pain, fever or headache.    [provider]  apixaban  (ELIQUIS ) 5 MG TABS tablet Take 1 tablet (5 mg total) by mouth 2 (two) times daily. 05/24/23   Vernon Ranks, MD  ASCORBIC ACID  PO Take 1 tablet by mouth daily. Vitamin C .    [provider]  cetirizine (ZYRTEC) 10 MG tablet Take 10 mg by mouth daily as needed for allergies.    [provider]  Cholecalciferol  (VITAMIN D -3 PO) Take 1 capsule by mouth daily.    [provider]  Cyanocobalamin  (VITAMIN B12) 1000 MCG TBCR Take 1 tablet by mouth daily.    [provider]  docusate sodium  (COLACE) 100 MG capsule Take 100 mg by mouth daily.     [provider]  esomeprazole (NEXIUM 24HR) 20 MG  capsule Take 20 mg by mouth daily at 12 noon. Patient taking differently: Take 20 mg by mouth as needed.    [provider]  ferrous sulfate  325 (65 FE) MG tablet Take 1 tablet (325 mg total) by mouth every other day. Patient not taking: Reported on 12/11/2023 05/26/23 06/25/23  Pahwani, Ravi, MD  fexofenadine (ALLEGRA) 180 MG tablet Take 180 mg by mouth daily. Patient taking differently: Take 180 mg by mouth as needed for allergies or rhinitis.    [provider]  FIBER PO Take 3 tablets by mouth daily.    [provider]  fludrocortisone  (FLORINEF ) 0.1 MG tablet Take 1 tablet (100 mcg total) by mouth every evening. Patient taking differently: Take 100 mcg by mouth as needed. 09/10/23   Ladona Heinz, MD  fluticasone  (FLONASE ) 50 MCG/ACT nasal spray Place 1 spray into both nostrils daily as needed for allergies.    [provider]  glimepiride  (AMARYL ) 2 MG tablet Take 2 mg by mouth daily with breakfast. Patient not taking: Reported on 12/11/2023 11/14/20   [provider]  glipiZIDE (GLUCOTROL XL) 10 MG 24 hr tablet Take 10 mg by mouth daily.    [provider]  metFORMIN  (GLUCOPHAGE ) 500 MG tablet Take 500 mg by mouth 2 (two) times daily with a meal. Patient not taking: Reported on 12/11/2023    [provider]  midodrine  (PROAMATINE ) 10 MG tablet Take 1 tablet (10 mg total) by mouth 3 (three) times daily  with meals. While awake. Do not lay down for 3-4 hours after taking 07/31/22   Ladona Heinz, MD  Multiple Vitamins-Minerals (ZINC PO) Take 1 tablet by mouth daily.    [provider]  ondansetron  (ZOFRAN ) 4 MG tablet Take 1 tablet (4 mg total) by mouth every 8 (eight) hours as needed for nausea or vomiting. 09/30/23   Onesimo Emaline Brink, MD  Polyethyl Glycol-Propyl Glycol (SYSTANE OP) Place 1 drop into both eyes 2 (two) times daily as needed (dryness, irritation.).    [provider]  Pyridoxine  HCl (VITAMIN B-6 PO) Take 1  tablet by mouth daily. Patient not taking: Reported on 12/20/2023    [provider]  rosuvastatin  (CRESTOR ) 5 MG tablet Take 5 mg by mouth every evening.    [provider]  venetoclax  (VENCLEXTA ) 100 MG tablet Take 1 tablet (100 mg total) by mouth daily. Tablets should be swallowed whole with a meal and a full glass of water. 10/15/23   Onesimo Emaline Brink, MD    Allergies: Norvasc [amlodipine besylate], Augmentin [amoxicillin -pot clavulanate], Bystolic [nebivolol hcl], Coq10 [coenzyme q10], Cozaar [losartan], Farxiga [dapagliflozin], Glucotrol [glipizide], Januvia [sitagliptin], Keflex [cephalexin], Lipitor [atorvastatin calcium ], Lipitor [atorvastatin], Lopid [gemfibrozil], Metformin , Omnicef [cefdinir], Prilosec [omeprazole], Toprol  xl [metoprolol ], Vibra-tab [doxycycline], Zestril [lisinopril], Zetia [ezetimibe], Zocor [simvastatin], and Neurontin [gabapentin]    Review of Systems  Updated Vital Signs BP (!) 166/81 (BP Location: Left Arm)   Pulse 70   Temp 98.1 F (36.7 C) (Oral)   Resp 13   Ht 5' 10 (1.778 m)   Wt 70.3 kg   SpO2 100%   BMI 22.24 kg/m   Physical Exam Vitals and nursing note reviewed.  Constitutional:      General: He is not in acute distress.    Appearance: He is not ill-appearing.  Eyes:     General: No visual field deficit. Neck:     Comments: No meningeal signs on examination.  No midline cervical spine tenderness.  Spurling's test negative Cardiovascular:     Rate and Rhythm: Normal rate. Rhythm irregularly irregular.     Heart sounds: Normal heart sounds. No murmur heard. Abdominal:     General: There is no distension.     Palpations: Abdomen is soft.  Musculoskeletal:     Cervical back: Full passive range of motion without pain and normal range of motion.     Right lower leg: No edema.     Left lower leg: No edema.     Comments: Spontaneous movement of bilateral upper and lower extremities.   Skin:    Findings: No rash.   Neurological:     Mental Status: He is alert and oriented to person, place, and time.     Cranial Nerves: Cranial nerves 2-12 are intact. No cranial nerve deficit, dysarthria or facial asymmetry.     Sensory: Sensation is intact. No sensory deficit.     Motor: Motor function is intact. No weakness, abnormal muscle tone, seizure activity or pronator drift.     Coordination: Coordination is intact. Finger-Nose-Finger Test normal.  Psychiatric:        Behavior: Behavior is cooperative.     (all labs ordered are listed, but only abnormal results are displayed) Labs Reviewed  CBC WITH DIFFERENTIAL/PLATELET - Abnormal; Notable for the following components:      Result Value   RBC 3.42 (*)    Hemoglobin 11.0 (*)    HCT 32.1 (*)    Platelets 62 (*)    All other  components within normal limits  COMPREHENSIVE METABOLIC PANEL WITH GFR - Abnormal; Notable for the following components:   Sodium 134 (*)    BUN 26 (*)    Creatinine, Ser 1.36 (*)    Total Protein 6.0 (*)    GFR, Estimated 52 (*)    Anion gap 4 (*)    All other components within normal limits  MAGNESIUM   CBG MONITORING, ED    EKG: EKG Interpretation Date/Time:  Thursday December 26 2023 19:53:33 EDT Ventricular Rate:  69 PR Interval:  200 QRS Duration:  139 QT Interval:  415 QTC Calculation: 445 R Axis:   -72  Text Interpretation: Sinus rhythm RBBB and LAFB Probable left ventricular hypertrophy Borderline ST elevation, lateral leads No significant change since last tracing Confirmed by Emil Share [phone]) on 12/26/2023 9:38:42 PM  Radiology: CT Head Wo Contrast Result Date: 12/26/2023 CLINICAL DATA:  transient LUE and facial numbness EXAM: CT HEAD WITHOUT CONTRAST TECHNIQUE: Contiguous axial images were obtained from the base of the skull through the vertex without intravenous contrast. RADIATION DOSE REDUCTION: This exam was performed according to the departmental dose-optimization program which includes automated exposure  control, adjustment of the mA and/or kV according to patient size and/or use of iterative reconstruction technique. COMPARISON:  MRI head 10/25/2022, CT head 10/25/2022 FINDINGS: Brain: Patchy and confluent areas of decreased attenuation are noted throughout the deep and periventricular white matter of the cerebral hemispheres bilaterally, compatible with chronic microvascular ischemic disease. No evidence of large-territorial acute infarction. No parenchymal hemorrhage. No mass lesion. No extra-axial collection. No mass effect or midline shift. No hydrocephalus. Basilar cisterns are patent. Vascular: No hyperdense vessel. Skull: No acute fracture or focal lesion. Sinuses/Orbits: Paranasal sinuses and mastoid air cells are clear. The orbits are unremarkable. Other: None. IMPRESSION: No acute intracranial abnormality. Electronically Signed   By: Morgane  Naveau M.D.   On: 12/26/2023 20:26     Procedures   Medications Ordered in the ED  acetaminophen  (TYLENOL ) tablet 650 mg (650 mg Oral Not Given 12/26/23 2027)    Clinical Course as of 12/26/23 2349  Thu Dec 26, 2023  2025 EKG reviewed by me: Normal sinus rhythm with left axis.  Widened QRS without evidence of acute ischemic change or dysrhythmia [AG]  2035 CT head reviewed by me without evidence of acute intracranial abnormality including ICH or skull fracture [AG]  2133 Consult neuro placed [AG]  2147 Neuro consult- CTA head and neck- if neg can follow oupt, if + vasc surg consult [AG]  2325 CT ANGIO HEAD NECK W WO CM [AG]    Clinical Course User Index [AG] Nada Chroman, DO                                 Medical Decision Making Amount and/or Complexity of Data Reviewed Labs: ordered. Radiology: ordered. Decision-making details documented in ED Course.  Risk OTC drugs.   On initial valuation patient is hemodynamically stable, afebrile and not in acute distress.  Patient does not have evidence of focal neurologic deficits on  examination therefore code stroke not called.  Based upon patient's history differential diagnosis includes CVA/TIA, meningitis/encephalitis, cervical radiculopathy, trauma, ICH, peripheral neuropathy.  Based on patient's history and physical examination findings, will obtain laboratory studies as well as CT head Noncon.  Laboratory studies without evidence of significant anemia, electrolyte abnormality, AKI or leukocytosis.  Patient's presentation less likely meningitis/encephalitis as patient has no focal  neurologic deficits, and is hemodynamically stable without leukocytosis and is afebrile.  Patient does not have evidence of cervical radiculopathy on examination as patient has negative Spurling sign, no midline cervical spine tenderness.  Patient does have point tenderness on right trapezius and shoulder therefore could be musculoskeletal in origin however without evidence of falls/trauma do not believe further imaging is necessary at this time.  CT head without contrast without evidence of acute intracranial abnormality.  Based on patient's history, cannot rule out TIA at this time.  Neurology consulted with recommendations to obtain CTA head and neck.  CTA head and neck pending at time of handoff.  Handoff given to oncoming team with disposition pending results of CTA head and neck.     Final diagnoses:  Paresthesia    ED Discharge Orders     None       Lavanda Bolster DO Emergency Medicine PGY2   Bolster Lavanda, DO 12/26/23 2349    Emil Share, DO 12/27/23 [phone]

## 2023-12-27 ENCOUNTER — Emergency Department (HOSPITAL_COMMUNITY)

## 2023-12-27 MED ORDER — IOHEXOL 350 MG/ML SOLN
75.0000 mL | Freq: Once | INTRAVENOUS | Status: AC | PRN
  Administered 2023-12-27: 75 mL via INTRAVENOUS

## 2023-12-27 NOTE — ED Provider Notes (Signed)
 Assumed care at shift change.  See prior notes for full H&P.  Briefly, 83 y.o. M here with 10 mins of left sided facial numbness that has fully resolved.  Work-up thus far reassuring.  Case discussed with neurology, recommended CTA and if reassuring ok for discharge.  Plan:  CTA pending.  Results for orders placed or performed during the hospital encounter of 12/26/23  CBC with Differential   Collection Time: 12/26/23  8:13 PM  Result Value Ref Range   WBC 4.0 4.0 - 10.5 K/uL   RBC 3.42 (L) 4.22 - 5.81 MIL/uL   Hemoglobin 11.0 (L) 13.0 - 17.0 g/dL   HCT 67.8 (L) 60.9 - 47.9 %   MCV 93.9 80.0 - 100.0 fL   MCH 32.2 26.0 - 34.0 pg   MCHC 34.3 30.0 - 36.0 g/dL   RDW 87.2 88.4 - 84.4 %   Platelets 62 (L) 150 - 400 K/uL   nRBC 0.0 0.0 - 0.2 %   Neutrophils Relative % 44 %   Neutro Abs 1.8 1.7 - 7.7 K/uL   Lymphocytes Relative 49 %   Lymphs Abs 2.0 0.7 - 4.0 K/uL   Monocytes Relative 5 %   Monocytes Absolute 0.2 0.1 - 1.0 K/uL   Eosinophils Relative 1 %   Eosinophils Absolute 0.0 0.0 - 0.5 K/uL   Basophils Relative 0 %   Basophils Absolute 0.0 0.0 - 0.1 K/uL   WBC Morphology See Note    RBC Morphology MORPHOLOGY UNREMARKABLE    Smear Review See Note   Comprehensive metabolic panel   Collection Time: 12/26/23  8:13 PM  Result Value Ref Range   Sodium 134 (L) 135 - 145 mmol/L   Potassium 4.2 3.5 - 5.1 mmol/L   Chloride 103 98 - 111 mmol/L   CO2 27 22 - 32 mmol/L   Glucose, Bld 88 70 - 99 mg/dL   BUN 26 (H) 8 - 23 mg/dL   Creatinine, Ser 8.63 (H) 0.61 - 1.24 mg/dL   Calcium  9.3 8.9 - 10.3 mg/dL   Total Protein 6.0 (L) 6.5 - 8.1 g/dL   Albumin 3.9 3.5 - 5.0 g/dL   AST 20 15 - 41 U/L   ALT 19 0 - 44 U/L   Alkaline Phosphatase 57 38 - 126 U/L   Total Bilirubin 0.5 0.0 - 1.2 mg/dL   GFR, Estimated 52 (L) >60 mL/min   Anion gap 4 (L) 5 - 15  Magnesium    Collection Time: 12/26/23  8:13 PM  Result Value Ref Range   Magnesium  1.9 1.7 - 2.4 mg/dL  POC CBG, ED   Collection Time:  12/26/23  8:28 PM  Result Value Ref Range   Glucose-Capillary 79 70 - 99 mg/dL   CT ANGIO HEAD NECK W WO CM Result Date: 12/27/2023 CLINICAL DATA:  Neuro deficit, acute, stroke suspected EXAM: CT ANGIOGRAPHY HEAD AND NECK WITH AND WITHOUT CONTRAST TECHNIQUE: Multidetector CT imaging of the head and neck was performed using the standard protocol during bolus administration of intravenous contrast. Multiplanar CT image reconstructions and MIPs were obtained to evaluate the vascular anatomy. Carotid stenosis measurements (when applicable) are obtained utilizing NASCET criteria, using the distal internal carotid diameter as the denominator. RADIATION DOSE REDUCTION: This exam was performed according to the departmental dose-optimization program which includes automated exposure control, adjustment of the mA and/or kV according to patient size and/or use of iterative reconstruction technique. CONTRAST:  75mL OMNIPAQUE  IOHEXOL  350 MG/ML SOLN, 75mL OMNIPAQUE  IOHEXOL  350 MG/ML SOLN COMPARISON:  None Available. FINDINGS: CTA NECK FINDINGS Aortic arch: Aortic atherosclerosis. Great vessel origins are patent without significant stenosis. Right carotid system: No evidence of dissection, stenosis (50% or greater), or occlusion. Left carotid system: No evidence of dissection, stenosis (50% or greater), or occlusion. Vertebral arteries: Severe right vertebral artery origin stenosis. Otherwise, the vertebral arteries are patent without significant stenosis. Left dominant. Skeleton: No acute fracture. Other neck: No acute abnormality. Upper chest: Lung apices are clear. Review of the MIP images confirms the above findings CTA HEAD FINDINGS Anterior circulation: Bilateral intracranial ICAs, MCAs, and ACAs are patent without proximal hemodynamically significant stenosis. Posterior circulation: Bilateral intradural vertebral arteries, basilar artery and bilateral posterior cerebral arteries are patent without proximal  hemodynamically significant stenosis. Right fetal type PCA, anatomic variant. Venous sinuses: As permitted by contrast timing, patent. Review of the MIP images confirms the above findings IMPRESSION: 1. No emergent large vessel occlusion. 2. Severe right vertebral artery origin stenosis. 3. Aortic atherosclerosis (ICD10-I70.0). Electronically Signed   By: Gilmore GORMAN Molt M.D.   On: 12/27/2023 02:45   CT Head Wo Contrast Result Date: 12/26/2023 CLINICAL DATA:  transient LUE and facial numbness EXAM: CT HEAD WITHOUT CONTRAST TECHNIQUE: Contiguous axial images were obtained from the base of the skull through the vertex without intravenous contrast. RADIATION DOSE REDUCTION: This exam was performed according to the departmental dose-optimization program which includes automated exposure control, adjustment of the mA and/or kV according to patient size and/or use of iterative reconstruction technique. COMPARISON:  MRI head 10/25/2022, CT head 10/25/2022 FINDINGS: Brain: Patchy and confluent areas of decreased attenuation are noted throughout the deep and periventricular white matter of the cerebral hemispheres bilaterally, compatible with chronic microvascular ischemic disease. No evidence of large-territorial acute infarction. No parenchymal hemorrhage. No mass lesion. No extra-axial collection. No mass effect or midline shift. No hydrocephalus. Basilar cisterns are patent. Vascular: No hyperdense vessel. Skull: No acute fracture or focal lesion. Sinuses/Orbits: Paranasal sinuses and mastoid air cells are clear. The orbits are unremarkable. Other: None. IMPRESSION: No acute intracranial abnormality. Electronically Signed   By: Morgane  Naveau M.D.   On: 12/26/2023 20:26   CUP PACEART REMOTE DEVICE CHECK Result Date: 12/17/2023 ILR summary report received. Battery status OK. Normal device function. No new symptom, tachy, brady, or pause episodes. No new AF episodes. Monthly summary reports and ROV/PRN AB,  CVRS  CTA with right vertebral artery stenosis present.  This was seen on prior CT from 10/26/22 as well.  Discussed with attending, Dr. Jerral, and not felt to likely be causing his left sided symptoms from earlier  Felt ok for discharge with OP follow-up.  Also recommended to follow-up with neurology, he is established with Dr. Rush.  Can return here for new concerns.   Jarold Olam HERO, PA-C 12/27/23 [id number]    Jerral Meth, MD 12/27/23 [phone]

## 2023-12-27 NOTE — Discharge Instructions (Signed)
 Your CT's today did not show any findings of stroke.  You do have some stenosis on your right side, this has been present for a few years but probably warrants some follow-up. Recommend to follow-up closely with neurology as well. Return here for new concerns.

## 2023-12-31 ENCOUNTER — Other Ambulatory Visit (HOSPITAL_COMMUNITY): Payer: Self-pay

## 2023-12-31 ENCOUNTER — Telehealth: Payer: Self-pay | Admitting: Psychiatry

## 2023-12-31 ENCOUNTER — Other Ambulatory Visit: Payer: Self-pay

## 2023-12-31 NOTE — Telephone Encounter (Signed)
 Phone room: please call back. This would be new issue. Saw Dr. Rush in the past for different issue. They need referral from PCP and then we can get him scheduled with a MD here for evaluation.

## 2023-12-31 NOTE — Telephone Encounter (Signed)
 Patient's wife,Audrey Grossi, patient went ED 12/26/22 for Paresthesia. Went by EMS to ED because patient was having numbness in his face to month. Gave him a Asprin and that raised his blood pressure. Would like a appointment as soon as possible.

## 2023-12-31 NOTE — Progress Notes (Signed)
 Specialty Pharmacy Ongoing Clinical Assessment Note  Eric Harrington is a 83 y.o. male who is being followed by the specialty pharmacy service for RxSp Oncology   Patient's specialty medication(s) reviewed today: Venetoclax  (VENCLEXTA )   Missed doses in the last 4 weeks: 0   Patient/Caregiver did not have any additional questions or concerns.   Therapeutic benefit summary: Patient is achieving benefit   Adverse events/side effects summary: No adverse events/side effects   Patient's therapy is appropriate to: Continue    Goals Addressed             This Visit's Progress    Maintain optimal adherence to therapy   On track    Patient is on track. Patient will maintain adherence.  Per Dr. Maryla notes from 12/20/23, he has seen improvement in his blood counts.         Follow up: 3 months  Silvano LOISE Dolly Specialty Pharmacist

## 2023-12-31 NOTE — Telephone Encounter (Signed)
 Noted

## 2024-01-16 ENCOUNTER — Other Ambulatory Visit (HOSPITAL_COMMUNITY): Payer: Self-pay

## 2024-01-16 ENCOUNTER — Ambulatory Visit

## 2024-01-16 ENCOUNTER — Other Ambulatory Visit: Payer: Self-pay

## 2024-01-16 DIAGNOSIS — I480 Paroxysmal atrial fibrillation: Secondary | ICD-10-CM | POA: Diagnosis not present

## 2024-01-16 NOTE — Progress Notes (Signed)
 Specialty Pharmacy Refill Coordination Note  Spoke with Eric Harrington is a 83 y.o. male contacted today regarding refills of specialty medication(s) Venetoclax  (VENCLEXTA )  Doses on hand: 12  Patient requested: Delivery   Delivery date: 01/22/24   Verified address: [address] [place] [address]  Medication will be filled on 01/21/24.

## 2024-01-17 LAB — CUP PACEART REMOTE DEVICE CHECK
Date Time Interrogation Session: 20250828060044
Implantable Pulse Generator Implant Date: 20240716
Pulse Gen Model: 5000
Pulse Gen Serial Number: 511029620

## 2024-01-18 ENCOUNTER — Ambulatory Visit: Payer: Self-pay | Admitting: Cardiology

## 2024-01-21 ENCOUNTER — Other Ambulatory Visit: Payer: Self-pay

## 2024-01-23 NOTE — Progress Notes (Unsigned)
 Patient ID: Eric Harrington, male   DOB: 06-Nov-1940, 83 y.o.   MRN: [id number]  Reason for Consult: No chief complaint on file.   Referred by Rolinda Millman, MD  Subjective:     HPI Eric Harrington is a 83 y.o. male presenting for evaluation of proximal right vertebral stenosis.  This was noted on CTA from 12/27/2023 that was obtained for workup of possible stroke.  His stroke workup was negative and he was sent home from the emergency department when his right arm and hand numbness resolved. ***  Past Medical History:  Diagnosis Date   Arthritis    Chronic kidney disease    CKD3   Chronic Leukemia    Diabetes mellitus without complication (HCC)    Dysrhythmia    A. Fib   GERD (gastroesophageal reflux disease)    History of kidney stones    Hyperlipemia 12/02/2018   Hyperlipidemia    Hypertension    Hx. Now has lower BP issues. On midodrine    Loop recorder: [employer] Assert-IQ 3 Loop recorder. Serial # [id number] 12/04/2022 12/04/2022   Pneumonia    Stroke [facility])    TIA x2 since Dec 2023. No deficits   Type 2 diabetes mellitus with complication, without long-term current use of insulin  (HCC) 12/02/2018   Family History  Problem Relation Age of Onset   Lung cancer Mother    Heart attack Father    Dementia Sister    Diabetes Brother    Diabetes Sister    Heart attack Brother    Past Surgical History:  Procedure Laterality Date   ANTERIOR CERVICAL DECOMP/DISCECTOMY FUSION N/A 05/01/2023   Procedure: Anterior Cervical Decompression/ Discectomy Fusion Cervical Four-Cervical Five - Cervical Five-Cervical Six;  Surgeon: Louis Shove, MD;  Location: [facility] OR;  Service: Neurosurgery;  Laterality: N/A;  3C   CARDIAC CATHETERIZATION  2017   CATARACT EXTRACTION W/ INTRAOCULAR LENS IMPLANT Bilateral    COLONOSCOPY     VENTRAL HERNIA REPAIR      Short Social History:  Social History   Tobacco Use   Smoking status: Former    Current packs/day: 0.00    Average packs/day: 1 pack/day for  30.0 years (30.0 ttl pk-yrs)    Types: Cigarettes    Start date: 78    Quit date: 1980    Years since quitting: 45.7   Smokeless tobacco: Never   Tobacco comments:    started at age 48  Substance Use Topics   Alcohol use: Never    Allergies  Allergen Reactions   Norvasc [Amlodipine Besylate] Rash    Fatigue  Fever  Chills    Augmentin [Amoxicillin -Pot Clavulanate] Other (See Comments)    Stomach upset   Bystolic [Nebivolol Hcl] Other (See Comments)    Weakness    Coq10 [Coenzyme Q10] Other (See Comments)    Unknown reaction   Cozaar [Losartan] Other (See Comments)    Unknown reaction   Farxiga [Dapagliflozin] Other (See Comments)    Excessive urination Dehydration   Glucotrol [Glipizide] Other (See Comments)    Unknown reaction   Januvia [Sitagliptin] Other (See Comments)    Unknown reaction   Keflex [Cephalexin] Other (See Comments)    Unknown reaction   Lipitor [Atorvastatin Calcium ]    Lipitor [Atorvastatin] Other (See Comments)    Unknown reaction   Lopid [Gemfibrozil] Other (See Comments)    Unknown reaction   Metformin  Other (See Comments)   Omnicef [Cefdinir] Other (See Comments)    Unknown reaction  Prilosec [Omeprazole] Other (See Comments)    Unknown reaction   Toprol  Xl [Metoprolol ] Other (See Comments)    Weakness    Vibra-Tab [Doxycycline] Other (See Comments)    Fever  Chills   Zestril [Lisinopril] Other (See Comments)    Constipation     Zetia [Ezetimibe] Other (See Comments)    Unknown reaction   Zocor [Simvastatin] Other (See Comments)    Unknown reaction   Neurontin [Gabapentin] Other (See Comments)    Unknown reaction    Current Outpatient Medications  Medication Sig Dispense Refill   acetaminophen  (TYLENOL ) 500 MG tablet Take 1,000 mg by mouth every 8 (eight) hours as needed for moderate pain, fever or headache.     apixaban  (ELIQUIS ) 5 MG TABS tablet Take 1 tablet (5 mg total) by mouth 2 (two) times daily. 60 tablet 0   ASCORBIC  ACID PO Take 1 tablet by mouth daily. Vitamin C .     cetirizine (ZYRTEC) 10 MG tablet Take 10 mg by mouth daily as needed for allergies.     Cholecalciferol  (VITAMIN D -3 PO) Take 1 capsule by mouth daily.     Cyanocobalamin  (VITAMIN B12) 1000 MCG TBCR Take 1 tablet by mouth daily.     docusate sodium  (COLACE) 100 MG capsule Take 100 mg by mouth daily.      esomeprazole (NEXIUM 24HR) 20 MG capsule Take 20 mg by mouth daily at 12 noon. (Patient taking differently: Take 20 mg by mouth as needed.)     ferrous sulfate  325 (65 FE) MG tablet Take 1 tablet (325 mg total) by mouth every other day. (Patient not taking: Reported on 12/11/2023) 15 tablet 0   fexofenadine (ALLEGRA) 180 MG tablet Take 180 mg by mouth daily. (Patient taking differently: Take 180 mg by mouth as needed for allergies or rhinitis.)     FIBER PO Take 3 tablets by mouth daily.     fludrocortisone  (FLORINEF ) 0.1 MG tablet Take 1 tablet (100 mcg total) by mouth every evening. (Patient taking differently: Take 100 mcg by mouth as needed.) 90 tablet 1   fluticasone  (FLONASE ) 50 MCG/ACT nasal spray Place 1 spray into both nostrils daily as needed for allergies.     glimepiride  (AMARYL ) 2 MG tablet Take 2 mg by mouth daily with breakfast. (Patient not taking: Reported on 12/11/2023)     glipiZIDE (GLUCOTROL XL) 10 MG 24 hr tablet Take 10 mg by mouth daily.     metFORMIN  (GLUCOPHAGE ) 500 MG tablet Take 500 mg by mouth 2 (two) times daily with a meal. (Patient not taking: Reported on 12/11/2023)     midodrine  (PROAMATINE ) 10 MG tablet Take 1 tablet (10 mg total) by mouth 3 (three) times daily with meals. While awake. Do not lay down for 3-4 hours after taking 270 tablet 3   Multiple Vitamins-Minerals (ZINC PO) Take 1 tablet by mouth daily.     ondansetron  (ZOFRAN ) 4 MG tablet Take 1 tablet (4 mg total) by mouth every 8 (eight) hours as needed for nausea or vomiting. 20 tablet 0   Polyethyl Glycol-Propyl Glycol (SYSTANE OP) Place 1 drop into both  eyes 2 (two) times daily as needed (dryness, irritation.).     Pyridoxine  HCl (VITAMIN B-6 PO) Take 1 tablet by mouth daily. (Patient not taking: Reported on 12/20/2023)     rosuvastatin  (CRESTOR ) 5 MG tablet Take 5 mg by mouth every evening.     venetoclax  (VENCLEXTA ) 100 MG tablet Take 1 tablet (100 mg total) by mouth daily. Tablets  should be swallowed whole with a meal and a full glass of water. 30 tablet 3   No current facility-administered medications for this visit.    REVIEW OF SYSTEMS  All other systems were reviewed and are negative     Objective:  Objective   There were no vitals filed for this visit. There is no height or weight on file to calculate BMI.  Physical Exam General: no acute distress Cardiac: hemodynamically stable Pulm: normal work of breathing Abdomen: non-tender, no pulsatile mass*** Neuro: alert, no focal deficit Extremities: no edema, cyanosis or wounds*** Vascular:   Right: ***  Left: ***  Data: CTA independently reviewed Widely patent common, external and internal carotid arteries bilaterally.  The left vertebral is dominant and widely patent.  The right vertebral is diminutive with a significant stenosis at its ostium.     Assessment/Plan:   Eric Harrington is a 83 y.o. male with a symptomatic right vertebral artery stenosis.  I explained that his carotids are widely patent and that his left vertebral is dominant.  The right ostial stenosis is not a concern as he does not have vertebrobasilar symptoms and likely will not have any issues from this in the future. ***   Norman GORMAN Serve MD [facility]

## 2024-01-24 ENCOUNTER — Ambulatory Visit: Attending: Vascular Surgery | Admitting: Vascular Surgery

## 2024-01-24 ENCOUNTER — Encounter: Payer: Self-pay | Admitting: Vascular Surgery

## 2024-01-24 VITALS — BP 133/73 | HR 74 | Temp 98.4°F | Resp 20 | Ht 70.0 in | Wt 164.1 lb

## 2024-01-24 DIAGNOSIS — I6501 Occlusion and stenosis of right vertebral artery: Secondary | ICD-10-CM | POA: Diagnosis not present

## 2024-01-24 NOTE — Progress Notes (Signed)
 Remote Loop Recorder Transmission

## 2024-01-28 NOTE — Progress Notes (Signed)
 Carelink Summary Report / Loop Recorder

## 2024-01-30 NOTE — Progress Notes (Signed)
 Remote Loop Recorder Transmission

## 2024-02-03 ENCOUNTER — Telehealth: Payer: Self-pay

## 2024-02-03 NOTE — Telephone Encounter (Signed)
   Pre-operative Risk Assessment    Patient Name: Eric Harrington  DOB: Oct 01, 1940 MRN: [id number]   Date of last office visit: 12/11/23 DAPHNE BARRACK, NP Date of next office visit: 03/16/24 DAPHNE BARRACK, NP  Request for Surgical Clearance    Procedure:  CLEANING, RADIOGRAPHS, FILLINGS (CALL AND CONFIRMED)  Date of Surgery:  Clearance TBD                                Surgeon:  HOUSTON BLANCH, DMD Surgeon's Group or Practice Name:  [facility] Phone number:  [phone] Fax number:  [phone]   Type of Clearance Requested:   - Medical  - Pharmacy:  Hold Apixaban  (Eliquis )     Type of Anesthesia:  Local WITH EPINEPHRINE   Additional requests/questions:  Does this patient need antibiotics?  Signed, Lucie DELENA Ku   02/03/2024, 10:55 AM

## 2024-02-03 NOTE — Telephone Encounter (Signed)
   Patient Name: Eric Harrington  DOB: [birth year] MRN: [id number]  Primary Cardiologist: Gordy Bergamo, MD  Chart reviewed as part of pre-operative protocol coverage.   Dental extractions (i.e. 1-2 teeth) are considered low risk procedures per guidelines and generally do not require any specific cardiac clearance. It is also generally accepted that for simple extractions and dental cleanings, there is no need to interrupt blood thinner therapy.  Given history of Afib, would avoid epinephrine.   SBE prophylaxis is not required for the patient from a cardiac standpoint.  I will route this recommendation to the requesting party via Epic fax function and remove from pre-op pool.  Please call with questions.  Jon Garre Maecyn Panning, PA 02/03/2024, 2:49 PM

## 2024-02-10 ENCOUNTER — Other Ambulatory Visit (HOSPITAL_COMMUNITY): Payer: Self-pay

## 2024-02-11 ENCOUNTER — Other Ambulatory Visit: Payer: Self-pay

## 2024-02-11 ENCOUNTER — Emergency Department (HOSPITAL_BASED_OUTPATIENT_CLINIC_OR_DEPARTMENT_OTHER)
Admission: EM | Admit: 2024-02-11 | Discharge: 2024-02-11 | Disposition: A | Discharge status: Home / Self Care | Attending: Emergency Medicine | Admitting: Emergency Medicine

## 2024-02-11 DIAGNOSIS — R5383 Other fatigue: Secondary | ICD-10-CM | POA: Diagnosis present

## 2024-02-11 DIAGNOSIS — Z7901 Long term (current) use of anticoagulants: Secondary | ICD-10-CM | POA: Diagnosis not present

## 2024-02-11 DIAGNOSIS — I959 Hypotension, unspecified: Secondary | ICD-10-CM | POA: Diagnosis not present

## 2024-02-11 LAB — CBC WITH DIFFERENTIAL/PLATELET
Abs Immature Granulocytes: 0.04 K/uL (ref 0.00–0.07)
Basophils Absolute: 0 K/uL (ref 0.0–0.1)
Basophils Relative: 0 %
Eosinophils Absolute: 0.1 K/uL (ref 0.0–0.5)
Eosinophils Relative: 2 %
HCT: 34 % — ABNORMAL LOW (ref 39.0–52.0)
Hemoglobin: 11.8 g/dL — ABNORMAL LOW (ref 13.0–17.0)
Immature Granulocytes: 1 %
Lymphocytes Relative: 36 %
Lymphs Abs: 2.2 K/uL (ref 0.7–4.0)
MCH: 31.3 pg (ref 26.0–34.0)
MCHC: 34.7 g/dL (ref 30.0–36.0)
MCV: 90.2 fL (ref 80.0–100.0)
Monocytes Absolute: 1.1 K/uL — ABNORMAL HIGH (ref 0.1–1.0)
Monocytes Relative: 17 %
Neutro Abs: 2.7 K/uL (ref 1.7–7.7)
Neutrophils Relative %: 44 %
Platelets: 83 K/uL — ABNORMAL LOW (ref 150–400)
RBC: 3.77 MIL/uL — ABNORMAL LOW (ref 4.22–5.81)
RDW: 13.7 % (ref 11.5–15.5)
WBC: 6.2 K/uL (ref 4.0–10.5)
nRBC: 0 % (ref 0.0–0.2)

## 2024-02-11 LAB — URINALYSIS, ROUTINE W REFLEX MICROSCOPIC
Bilirubin Urine: NEGATIVE
Glucose, UA: NEGATIVE mg/dL
Hgb urine dipstick: NEGATIVE
Ketones, ur: NEGATIVE mg/dL
Leukocytes,Ua: NEGATIVE
Nitrite: NEGATIVE
Protein, ur: 30 mg/dL — AB
Specific Gravity, Urine: 1.015 (ref 1.005–1.030)
pH: 6 (ref 5.0–8.0)

## 2024-02-11 LAB — URINALYSIS, MICROSCOPIC (REFLEX): WBC, UA: NONE SEEN WBC/hpf (ref 0–5)

## 2024-02-11 LAB — COMPREHENSIVE METABOLIC PANEL WITH GFR
ALT: 24 U/L (ref 0–44)
AST: 23 U/L (ref 15–41)
Albumin: 4.9 g/dL (ref 3.5–5.0)
Alkaline Phosphatase: 84 U/L (ref 38–126)
Anion gap: 11 (ref 5–15)
BUN: 22 mg/dL (ref 8–23)
CO2: 25 mmol/L (ref 22–32)
Calcium: 9.7 mg/dL (ref 8.9–10.3)
Chloride: 98 mmol/L (ref 98–111)
Creatinine, Ser: 1.32 mg/dL — ABNORMAL HIGH (ref 0.61–1.24)
GFR, Estimated: 54 mL/min — ABNORMAL LOW (ref >60)
Glucose, Bld: 102 mg/dL — ABNORMAL HIGH (ref 70–99)
Potassium: 4.2 mmol/L (ref 3.5–5.1)
Sodium: 134 mmol/L — ABNORMAL LOW (ref 135–145)
Total Bilirubin: 0.5 mg/dL (ref 0.0–1.2)
Total Protein: 6.6 g/dL (ref 6.5–8.1)

## 2024-02-11 MED ORDER — SODIUM CHLORIDE 0.9 % IV BOLUS
1000.0000 mL | Freq: Once | INTRAVENOUS | Status: AC
  Administered 2024-02-11: 1000 mL via INTRAVENOUS

## 2024-02-11 NOTE — ED Provider Notes (Signed)
 [place] [facility] Provider Note   CSN: [id number] Arrival date & time: 02/11/24  1617     Patient presents with: Hypotension (Resolved with meds ) and Altered Mental Status   Eric Harrington is a 83 y.o. male.   83 yo M with a chief complaints of feeling fatigued.  Been going on now for about a week.  He recently traveled to visit family a few towns over and upon returning has been a bit more fatigued than normal.  He went to go see his dermatologist today and there was some concern for confusion.  His wife provides most of the history but has trouble quantifying how long he has been confused or if this is a problem for him regularly.  He does have a diagnosis of orthostatic hypotension and has a prescription for midodrine .  He used to take this 3 times a day but it sounds like few months ago she stopped giving it to him.  Sometimes she will give him a half a pill in the morning or maybe more depending on what his blood pressure is.  His blood pressure was a bit low with his confusion at the dermatologist office so she gave him an extra of the medication and his blood pressure has improved.  She was worried that maybe he is anemic.  Has a history of CLL and is on oral chemotherapy.  They deny any infectious symptoms deny cough congestion or fever denies nausea vomiting or diarrhea denies dark stool or blood in the stool.  Feels like he has been able to eat and drink regularly.   Altered Mental Status      Prior to Admission medications   Medication Sig Start Date End Date Taking? Authorizing Provider  acetaminophen  (TYLENOL ) 500 MG tablet Take 1,000 mg by mouth every 8 (eight) hours as needed for moderate pain, fever or headache.    [provider]  apixaban  (ELIQUIS ) 5 MG TABS tablet Take 1 tablet (5 mg total) by mouth 2 (two) times daily. 05/24/23   Vernon Ranks, MD  ASCORBIC ACID  PO Take 1 tablet by mouth daily. Vitamin C .    [provider]  cetirizine (ZYRTEC) 10 MG tablet Take 10 mg by mouth daily as needed for allergies.    [provider]  Cholecalciferol  (VITAMIN D -3 PO) Take 1 capsule by mouth daily.    [provider]  Cyanocobalamin  (VITAMIN B12) 1000 MCG TBCR Take 1 tablet by mouth daily.    [provider]  docusate sodium  (COLACE) 100 MG capsule Take 100 mg by mouth daily.     [provider]  esomeprazole (NEXIUM 24HR) 20 MG capsule Take 20 mg by mouth daily at 12 noon. Patient taking differently: Take 20 mg by mouth as needed.    [provider]  ferrous sulfate  325 (65 FE) MG tablet Take 1 tablet (325 mg total) by mouth every other day. Patient not taking: Reported on 12/11/2023 05/26/23 06/25/23  Pahwani, Ravi, MD  fexofenadine (ALLEGRA) 180 MG tablet Take 180 mg by mouth daily. Patient taking differently: Take 180 mg by mouth as needed for allergies or rhinitis.    [provider]  FIBER PO Take 3 tablets by mouth daily.    [provider]  fludrocortisone  (FLORINEF ) 0.1 MG tablet Take 1 tablet (100 mcg total) by mouth every evening. Patient taking differently: Take 100 mcg by mouth as needed. 09/10/23   Ladona Heinz, MD  fluticasone  (  FLONASE ) 50 MCG/ACT nasal spray Place 1 spray into both nostrils daily as needed for allergies.    [provider]  glipiZIDE (GLUCOTROL XL) 10 MG 24 hr tablet Take 10 mg by mouth daily.    [provider]  glipiZIDE (GLUCOTROL XL) 2.5 MG 24 hr tablet Take 2.5 mg by mouth daily with breakfast. 11/20/23   [provider]  midodrine  (PROAMATINE ) 10 MG tablet Take 1 tablet (10 mg total) by mouth 3 (three) times daily with meals. While awake. Do not lay down for 3-4 hours after taking 07/31/22   Ladona Heinz, MD  Multiple Vitamins-Minerals (ZINC PO) Take 1 tablet by mouth daily.    [provider]  ondansetron  (ZOFRAN ) 4 MG tablet Take 1 tablet (4 mg total) by mouth every 8 (eight) hours as  needed for nausea or vomiting. 09/30/23   Onesimo Emaline Brink, MD  Polyethyl Glycol-Propyl Glycol (SYSTANE OP) Place 1 drop into both eyes 2 (two) times daily as needed (dryness, irritation.).    [provider]  rosuvastatin  (CRESTOR ) 5 MG tablet Take 5 mg by mouth every evening.    [provider]  venetoclax  (VENCLEXTA ) 100 MG tablet Take 1 tablet (100 mg total) by mouth daily. Tablets should be swallowed whole with a meal and a full glass of water. 10/15/23   Onesimo Emaline Brink, MD    Allergies: Norvasc [amlodipine besylate], Augmentin [amoxicillin -pot clavulanate], Bystolic [nebivolol hcl], Coq10 [coenzyme q10], Cozaar [losartan], Farxiga [dapagliflozin], Glucotrol [glipizide], Januvia [sitagliptin], Keflex [cephalexin], Lipitor [atorvastatin calcium ], Lipitor [atorvastatin], Lopid [gemfibrozil], Metformin , Omnicef [cefdinir], Prilosec [omeprazole], Toprol  xl [metoprolol ], Vibra-tab [doxycycline], Zestril [lisinopril], Zetia [ezetimibe], Zocor [simvastatin], and Neurontin [gabapentin]    Review of Systems  Updated Vital Signs BP (!) 222/80   Pulse (!) 49   Temp 98.1 F (36.7 C)   Resp (!) 22   Ht 5' 10 (1.778 m)   Wt 74.4 kg   SpO2 97%   BMI 23.53 kg/m   Physical Exam Vitals and nursing note reviewed.  Constitutional:      Appearance: He is well-developed.  HENT:     Head: Normocephalic and atraumatic.  Eyes:     Pupils: Pupils are equal, round, and reactive to light.  Neck:     Vascular: No JVD.  Cardiovascular:     Rate and Rhythm: Normal rate and regular rhythm.     Heart sounds: No murmur heard.    No friction rub. No gallop.  Pulmonary:     Effort: No respiratory distress.     Breath sounds: No wheezing.  Abdominal:     General: There is no distension.     Tenderness: There is no abdominal tenderness. There is no guarding or rebound.  Musculoskeletal:        General: Normal range of motion.     Cervical back: Normal range of motion and neck  supple.  Skin:    Coloration: Skin is not pale.     Findings: No rash.  Neurological:     Mental Status: He is alert and oriented to person, place, and time.  Psychiatric:        Behavior: Behavior normal.     (all labs ordered are listed, but only abnormal results are displayed) Labs Reviewed  CBC WITH DIFFERENTIAL/PLATELET - Abnormal; Notable for the following components:      Result Value   RBC 3.77 (*)    Hemoglobin 11.8 (*)    HCT 34.0 (*)    Platelets 83 (*)  Monocytes Absolute 1.1 (*)    All other components within normal limits  COMPREHENSIVE METABOLIC PANEL WITH GFR - Abnormal; Notable for the following components:   Sodium 134 (*)    Glucose, Bld 102 (*)    Creatinine, Ser 1.32 (*)    GFR, Estimated 54 (*)    All other components within normal limits  URINALYSIS, ROUTINE W REFLEX MICROSCOPIC - Abnormal; Notable for the following components:   Protein, ur 30 (*)    All other components within normal limits  URINALYSIS, MICROSCOPIC (REFLEX) - Abnormal; Notable for the following components:   Bacteria, UA RARE (*)    All other components within normal limits    EKG: None  Radiology: No results found.   Procedures   Medications Ordered in the ED  sodium chloride  0.9 % bolus 1,000 mL (1,000 mLs Intravenous New Bag/Given 02/11/24 1720)                                    Medical Decision Making Amount and/or Complexity of Data Reviewed Labs: ordered.   83 yo M with a chief complaints of fatigue.  Going on for about a week now.  Sounds like he visited some relatives and upon returning has been a bit more fatigued than normal.  He has a history of orthostatic hypotension but has not been taking his midodrine  regularly and has not needed it for the past few months per his wife.  No obvious infectious symptoms endorsed by family or patient.  Will check blood work here bolus of IV fluids reassess.  Patient continues to feel better on repeat assessment.   Perhaps the patient was a bit dehydrated.  The wife retells the story for me on my repeat exam.  Tells me that he is not usually confused but seems a bit confused during the episode at the dermatologist office.  Was noted to have repeat blood pressures in the 80s and 90s.  Seemed to improve with midodrine  administration.  No acute anemia, no significant electrolyte abnormalities.  UA negative for infection.  Will have them follow-up with their PCP oncologist and cardiologist in clinic.  5:58 PM:  I have discussed the diagnosis/risks/treatment options with the patient and family.  Evaluation and diagnostic testing in the emergency department does not suggest an emergent condition requiring admission or immediate intervention beyond what has been performed at this time.  They will follow up with PCP, Cards, oncology. We also discussed returning to the ED immediately if new or worsening sx occur. We discussed the sx which are most concerning (e.g., sudden worsening pain, fever, inability to tolerate by mouth, stroke s/sx) that necessitate immediate return. Medications administered to the patient during their visit and any new prescriptions provided to the patient are listed below.  Medications given during this visit Medications  sodium chloride  0.9 % bolus 1,000 mL (1,000 mLs Intravenous New Bag/Given 02/11/24 1720)     The patient appears reasonably screen and/or stabilized for discharge and I doubt any other medical condition or other [facility] requiring further screening, evaluation, or treatment in the ED at this time prior to discharge.         Final diagnoses:  Hypotension, unspecified hypotension type    ED Discharge Orders          Ordered    Ambulatory referral to Cardiology       Comments: If you have not heard from  the Cardiology office within the next 72 hours please call [phone].   02/11/24 1755               Emil Share, DO 02/11/24 1758

## 2024-02-11 NOTE — Discharge Instructions (Signed)
 You can drink as well as again for the next couple days.  Please follow-up with your oncologist as well as your family doctor.  I placed an order that the cardiologist office should call you to try and set up an appointment.  Please return for recurrent symptoms one-sided numbness or weakness or difficulty with speech or swallowing.

## 2024-02-11 NOTE — ED Notes (Signed)
 Pt medically cleared for dc. DC instructions given, pt verbalized understanding. Out of ED with steady gait, all paperwork and belongings not in visible distress.

## 2024-02-11 NOTE — ED Triage Notes (Signed)
 Pt was at dermatology and was not acting right since sunday Has h/o orthostatic hypotension, was given po mitogrine 7.5mg  at time of low bp and states now feels better  States had episode confusion at office and unsteady gait    Wife concerned for low hgb due to needing blood in may and June   H/o CLL and TIA

## 2024-02-17 ENCOUNTER — Ambulatory Visit (INDEPENDENT_AMBULATORY_CARE_PROVIDER_SITE_OTHER)

## 2024-02-17 ENCOUNTER — Other Ambulatory Visit: Payer: Self-pay

## 2024-02-17 DIAGNOSIS — I480 Paroxysmal atrial fibrillation: Secondary | ICD-10-CM | POA: Diagnosis not present

## 2024-02-17 LAB — CUP PACEART REMOTE DEVICE CHECK
Date Time Interrogation Session: 20250928024343
Implantable Pulse Generator Implant Date: 20240716
Pulse Gen Model: 5000
Pulse Gen Serial Number: 511029620

## 2024-02-18 ENCOUNTER — Other Ambulatory Visit: Payer: Self-pay

## 2024-02-18 DIAGNOSIS — C9110 Chronic lymphocytic leukemia of B-cell type not having achieved remission: Secondary | ICD-10-CM

## 2024-02-19 ENCOUNTER — Other Ambulatory Visit: Payer: Self-pay

## 2024-02-19 ENCOUNTER — Inpatient Hospital Stay: Attending: Hematology

## 2024-02-19 ENCOUNTER — Inpatient Hospital Stay: Admitting: Hematology

## 2024-02-19 VITALS — BP 114/59 | HR 63 | Temp 97.3°F | Resp 18 | Wt 166.0 lb

## 2024-02-19 DIAGNOSIS — D696 Thrombocytopenia, unspecified: Secondary | ICD-10-CM

## 2024-02-19 DIAGNOSIS — R911 Solitary pulmonary nodule: Secondary | ICD-10-CM | POA: Diagnosis not present

## 2024-02-19 DIAGNOSIS — D649 Anemia, unspecified: Secondary | ICD-10-CM | POA: Diagnosis not present

## 2024-02-19 DIAGNOSIS — I129 Hypertensive chronic kidney disease with stage 1 through stage 4 chronic kidney disease, or unspecified chronic kidney disease: Secondary | ICD-10-CM | POA: Insufficient documentation

## 2024-02-19 DIAGNOSIS — I480 Paroxysmal atrial fibrillation: Secondary | ICD-10-CM | POA: Insufficient documentation

## 2024-02-19 DIAGNOSIS — Z79899 Other long term (current) drug therapy: Secondary | ICD-10-CM | POA: Diagnosis not present

## 2024-02-19 DIAGNOSIS — E1122 Type 2 diabetes mellitus with diabetic chronic kidney disease: Secondary | ICD-10-CM | POA: Diagnosis not present

## 2024-02-19 DIAGNOSIS — C9110 Chronic lymphocytic leukemia of B-cell type not having achieved remission: Secondary | ICD-10-CM | POA: Diagnosis not present

## 2024-02-19 DIAGNOSIS — Z87891 Personal history of nicotine dependence: Secondary | ICD-10-CM | POA: Insufficient documentation

## 2024-02-19 DIAGNOSIS — E1140 Type 2 diabetes mellitus with diabetic neuropathy, unspecified: Secondary | ICD-10-CM | POA: Diagnosis not present

## 2024-02-19 DIAGNOSIS — Z801 Family history of malignant neoplasm of trachea, bronchus and lung: Secondary | ICD-10-CM | POA: Diagnosis not present

## 2024-02-19 DIAGNOSIS — N1831 Chronic kidney disease, stage 3a: Secondary | ICD-10-CM | POA: Diagnosis not present

## 2024-02-19 LAB — CBC WITH DIFFERENTIAL (CANCER CENTER ONLY)
Abs Immature Granulocytes: 0.01 K/uL (ref 0.00–0.07)
Basophils Absolute: 0 K/uL (ref 0.0–0.1)
Basophils Relative: 1 %
Eosinophils Absolute: 0 K/uL (ref 0.0–0.5)
Eosinophils Relative: 0 %
HCT: 33.3 % — ABNORMAL LOW (ref 39.0–52.0)
Hemoglobin: 11.7 g/dL — ABNORMAL LOW (ref 13.0–17.0)
Immature Granulocytes: 0 %
Lymphocytes Relative: 32 %
Lymphs Abs: 1.1 K/uL (ref 0.7–4.0)
MCH: 31.4 pg (ref 26.0–34.0)
MCHC: 35.1 g/dL (ref 30.0–36.0)
MCV: 89.3 fL (ref 80.0–100.0)
Monocytes Absolute: 0.6 K/uL (ref 0.1–1.0)
Monocytes Relative: 16 %
Neutro Abs: 1.8 K/uL (ref 1.7–7.7)
Neutrophils Relative %: 51 %
Platelet Count: 71 K/uL — ABNORMAL LOW (ref 150–400)
RBC: 3.73 MIL/uL — ABNORMAL LOW (ref 4.22–5.81)
RDW: 13.8 % (ref 11.5–15.5)
Smear Review: NORMAL
WBC Count: 3.5 K/uL — ABNORMAL LOW (ref 4.0–10.5)
nRBC: 0 % (ref 0.0–0.2)

## 2024-02-19 LAB — CMP (CANCER CENTER ONLY)
ALT: 21 U/L (ref 0–44)
AST: 19 U/L (ref 15–41)
Albumin: 4.6 g/dL (ref 3.5–5.0)
Alkaline Phosphatase: 88 U/L (ref 38–126)
Anion gap: 6 (ref 5–15)
BUN: 19 mg/dL (ref 8–23)
CO2: 29 mmol/L (ref 22–32)
Calcium: 9.5 mg/dL (ref 8.9–10.3)
Chloride: 99 mmol/L (ref 98–111)
Creatinine: 1.51 mg/dL — ABNORMAL HIGH (ref 0.61–1.24)
GFR, Estimated: 46 mL/min — ABNORMAL LOW (ref >60)
Glucose, Bld: 235 mg/dL — ABNORMAL HIGH (ref 70–99)
Potassium: 4.5 mmol/L (ref 3.5–5.1)
Sodium: 134 mmol/L — ABNORMAL LOW (ref 135–145)
Total Bilirubin: 0.6 mg/dL (ref 0.0–1.2)
Total Protein: 6.5 g/dL (ref 6.5–8.1)

## 2024-02-19 LAB — SAMPLE TO BLOOD BANK

## 2024-02-19 LAB — LACTATE DEHYDROGENASE: LDH: 162 U/L (ref 98–192)

## 2024-02-19 NOTE — Progress Notes (Signed)
 Remote Loop Recorder Transmission

## 2024-02-19 NOTE — Progress Notes (Signed)
 HEMATOLOGY ONCOLOGY CLINIC NOTE  Date of Service: 02/19/24   Patient Care Team: Rolinda Millman, MD as PCP - General (Family Medicine) Ladona Heinz, MD as PCP - Cardiology (Cardiology) Cindie Ole DASEN, MD as PCP - Electrophysiology (Cardiology) Onesimo Emaline Brink, MD as Consulting Physician (Hematology)  CHIEF COMPLAINTS/PURPOSE OF CONSULTATION:  Continued evaluation and management of CLL  HISTORY OF PRESENTING ILLNESS:   Eric Harrington 83 y.o. male is here because of a referral from Dr. Camille from [facility] regarding a trend in his elevated WBC.   He is accompanied today by his wife of 54 years. The pt reports that he is doing well overall. He recently had a biopsy to check his prostate at [facility] with Dr. Medford Han and they took 12 core samples with reportedly no significant findings.  Of note prior to the patient's visit today, pt has had CBC completed on 04/26/17 with results revealing WBC at 14.7, Hgb at 12.6, Lymph Abs at 11.0 with all other values WNL. On 03/26/17 his WBC were 12.8, Hgb at 13.3 and Lymph's at  9.90. On 02/06/17 his WBC were 12.7, Hgb at 13.4, and Lymph's at 8.80.   On review of systems, pt reports post nasal drip, persistent cough, occasional troublesome stomach, pain in his lower abdomen that feels like his muscles are irritated, reports he has lost 25 lbs in the last 2.5 years (he associates a decreased appetite after taking beta blockers during this period) and denies no recent colds or infections, acute changes in energy levels, and leg swelling.   On PMHx the pt reports taking Zetia. He has IB'S and has had 3 colonoscopies with no significant findings or inflammatory processes. He reports that over the last 2.5 years he had heart catheterizations that all turned out well. He reports having diabetes. He also reports neuropathy along his whole right side that lasted for a few months that ceased abruptly.   Interval History:  INTERVAL  HISTORY   Eric Harrington is a wonderful 83 y.o. male male. Whom was last seen by me on 12/20/2023: at the time he was seen for continued evaluation and management of CLL.   Today, Eric Harrington is being seen today for continued evaluation and management of CLL and clearance for Dental extraction.    Eric Harrington reports that he feels well overall. He is accompanied by his wife and presents with a cane.    He mentions a past minor headache that has since resolved  Dental extraction will be needed within.  No need to stop venetoclax  for dental extraction.  PCP / Cardiologist will need to be okay with holding eliquis   From HEM OnC standpoint he is cleared for extractions  Dermatologist removed skin cancers, no nitrious oxide, cup to freeze some memory loss following this as mentioned by his wife.  Hgb is improving and plts are increasing and stable increase on venetoclax . Wbc is completley normal  Neutrophils is 1800 and completely normal  Wife speculates TIA and notes he was mumbling, could walk   Wife mentions EKG was done and was in AFIB and he has been started back on Amiodarone .  He said he is still cold  Informed a few factors fluid retention, eliquis , muscle mass  Recommend using additional insulation external   He mentions he sweats before he gets cold and mentions some shaking  Glucose - glipiside is used to maintain   notes a temporary monitor for 1 month in  November   Pt denies fever chills or night sweats  He mentions lymph nodes in cervical / neck ear area. Pain on left side. It could be due to dental issues   No abscess found from dentist pt said   Cardiovascular said blood flow is well .   Wife mentions pt was last seen in the ER in August due to numbness    Informed to stay on top of vaccinations based on tolerance and preferences    MEDICAL HISTORY:  1. HTN 2. DM2 3. H/o PAC's 4. transient a fib with cardiac cath 5. HLD 6. CKD stage 3 7. Irritable  bowel syndrome 8. GERD 9. H/o presyndope  SURGICAL HISTORY:  1. Prostate bx 2. Cardiac cath  SOCIAL HISTORY: Social History   Socioeconomic History   Marital status: Married    Spouse name: Not on file   Number of children: 2   Years of education: Not on file   Highest education level: Not on file  Occupational History   Not on file  Tobacco Use   Smoking status: Former    Current packs/day: 0.00    Average packs/day: 1 pack/day for 30.0 years (30.0 ttl pk-yrs)    Types: Cigarettes    Start date: 45    Quit date: 88    Years since quitting: 45.7   Smokeless tobacco: Never   Tobacco comments:    started at age 66  Vaping Use   Vaping status: Never Used  Substance and Sexual Activity   Alcohol use: Never   Drug use: Not Currently   Sexual activity: Not Currently    Birth control/protection: None  Other Topics Concern   Not on file  Social History Narrative   2 Cups of Coffee   Right Handed    Social Drivers of Health   Financial Resource Strain: Low Risk  (05/20/2023)   Overall Financial Resource Strain (CARDIA)    Difficulty of Paying Living Expenses: Not hard at all  Food Insecurity: No Food Insecurity (05/20/2023)   Hunger Vital Sign    Worried About Running Out of Food in the Last Year: Never true    Ran Out of Food in the Last Year: Never true  Transportation Needs: No Transportation Needs (05/20/2023)   PRAPARE - Administrator, Civil Service (Medical): No    Lack of Transportation (Non-Medical): No  Physical Activity: Inactive (05/20/2023)   Exercise Vital Sign    Days of Exercise per Week: 0 days    Minutes of Exercise per Session: 0 min  Stress: No Stress Concern Present (05/20/2023)   [employer] of Occupational Health - Occupational Stress Questionnaire    Feeling of Stress : Only a little  Social Connections: Moderately Integrated (05/20/2023)   Social Connection and Isolation Panel    Frequency of Communication with  Friends and Family: More than three times a week    Frequency of Social Gatherings with Friends and Family: Twice a week    Attends Religious Services: More than 4 times per year    Active Member of [employer] or Organizations: No    Attends Banker Meetings: Never    Marital Status: Married  Catering manager Violence: Not At Risk (05/20/2023)   Humiliation, Afraid, Rape, and Kick questionnaire    Fear of Current or Ex-Partner: No    Emotionally Abused: No    Physically Abused: No    Sexually Abused: No    FAMILY HISTORY: Family History  Problem  Relation Age of Onset   Lung cancer Mother    Heart attack Father    Dementia Sister    Diabetes Brother    Diabetes Sister    Heart attack Brother     ALLERGIES:  is allergic to norvasc [amlodipine besylate], augmentin [amoxicillin -pot clavulanate], bystolic [nebivolol hcl], coq10 [coenzyme q10], cozaar [losartan], farxiga [dapagliflozin], glucotrol [glipizide], januvia [sitagliptin], keflex [cephalexin], lipitor [atorvastatin calcium ], lipitor [atorvastatin], lopid [gemfibrozil], metformin , omnicef [cefdinir], prilosec [omeprazole], toprol  xl [metoprolol ], vibra-tab [doxycycline], zestril [lisinopril], zetia [ezetimibe], zocor [simvastatin], and neurontin [gabapentin].  MEDICATIONS:  Current Outpatient Medications  Medication Sig Dispense Refill   acetaminophen  (TYLENOL ) 500 MG tablet Take 1,000 mg by mouth every 8 (eight) hours as needed for moderate pain, fever or headache.     apixaban  (ELIQUIS ) 5 MG TABS tablet Take 1 tablet (5 mg total) by mouth 2 (two) times daily. 60 tablet 0   ASCORBIC ACID  PO Take 1 tablet by mouth daily. Vitamin C .     cetirizine (ZYRTEC) 10 MG tablet Take 10 mg by mouth daily as needed for allergies.     Cholecalciferol  (VITAMIN D -3 PO) Take 1 capsule by mouth daily.     Cyanocobalamin  (VITAMIN B12) 1000 MCG TBCR Take 1 tablet by mouth daily.     docusate sodium  (COLACE) 100 MG capsule Take 100 mg by  mouth daily.      esomeprazole (NEXIUM 24HR) 20 MG capsule Take 20 mg by mouth daily at 12 noon. (Patient taking differently: Take 20 mg by mouth as needed.)     ferrous sulfate  325 (65 FE) MG tablet Take 1 tablet (325 mg total) by mouth every other day. (Patient not taking: Reported on 12/11/2023) 15 tablet 0   fexofenadine (ALLEGRA) 180 MG tablet Take 180 mg by mouth daily. (Patient taking differently: Take 180 mg by mouth as needed for allergies or rhinitis.)     FIBER PO Take 3 tablets by mouth daily.     fludrocortisone  (FLORINEF ) 0.1 MG tablet Take 1 tablet (100 mcg total) by mouth every evening. (Patient taking differently: Take 100 mcg by mouth as needed.) 90 tablet 1   fluticasone  (FLONASE ) 50 MCG/ACT nasal spray Place 1 spray into both nostrils daily as needed for allergies.     glipiZIDE (GLUCOTROL XL) 10 MG 24 hr tablet Take 10 mg by mouth daily.     glipiZIDE (GLUCOTROL XL) 2.5 MG 24 hr tablet Take 2.5 mg by mouth daily with breakfast.     midodrine  (PROAMATINE ) 10 MG tablet Take 1 tablet (10 mg total) by mouth 3 (three) times daily with meals. While awake. Do not lay down for 3-4 hours after taking 270 tablet 3   Multiple Vitamins-Minerals (ZINC PO) Take 1 tablet by mouth daily.     ondansetron  (ZOFRAN ) 4 MG tablet Take 1 tablet (4 mg total) by mouth every 8 (eight) hours as needed for nausea or vomiting. 20 tablet 0   Polyethyl Glycol-Propyl Glycol (SYSTANE OP) Place 1 drop into both eyes 2 (two) times daily as needed (dryness, irritation.).     rosuvastatin  (CRESTOR ) 5 MG tablet Take 5 mg by mouth every evening.     venetoclax  (VENCLEXTA ) 100 MG tablet Take 1 tablet (100 mg total) by mouth daily. Tablets should be swallowed whole with a meal and a full glass of water. 30 tablet 3   No current facility-administered medications for this visit.    REVIEW OF SYSTEMS:   10 Point review of Systems was done is negative except as  noted above.  PHYSICAL EXAMINATION:  Vitals:   02/19/24  1233  BP: (!) 114/59  Pulse: 63  Resp: 18  Temp: (!) 97.3 F (36.3 C)  SpO2: 98%     Filed Weights   02/19/24 1233  Weight: 166 lb (75.3 kg)  NAD GENERAL:alert, in no acute distress and comfortable SKIN: no acute rashes, no significant lesions EYES: conjunctiva are pink and non-injected, sclera anicteric OROPHARYNX: MMM, no exudates, no oropharyngeal erythema or ulceration NECK: supple, no JVD LYMPH:  no palpable lymphadenopathy in the cervical, axillary or inguinal regions LUNGS: clear to auscultation b/l with normal respiratory effort HEART: regular rate & rhythm ABDOMEN:  normoactive bowel sounds , non tender, not distended. Extremity: no pedal edema PSYCH: alert & oriented x 3 with fluent speech NEURO: no focal motor/sensory deficits   LABORATORY DATA:   I have reviewed the data as listed  .    Latest Ref Rng & Units 02/19/2024   11:30 AM 02/11/2024    4:34 PM 12/26/2023    8:13 PM  CBC  WBC 4.0 - 10.5 K/uL 3.5  6.2  4.0   Hemoglobin 13.0 - 17.0 g/dL 88.2  88.1  88.9   Hematocrit 39.0 - 52.0 % 33.3  34.0  32.1   Platelets 150 - 400 K/uL 71  83  62     . CBC    Component Value Date/Time   WBC 3.5 (L) 02/19/2024 1130   WBC 6.2 02/11/2024 1634   RBC 3.73 (L) 02/19/2024 1130   HGB 11.7 (L) 02/19/2024 1130   HGB 7.8 (L) 09/09/2023 1330   HCT 33.3 (L) 02/19/2024 1130   HCT 26.0 (L) 09/09/2023 1330   PLT 71 (L) 02/19/2024 1130   PLT 93 (LL) 09/09/2023 1330   MCV 89.3 02/19/2024 1130   MCV 104 (H) 09/09/2023 1330   MCH 31.4 02/19/2024 1130   MCHC 35.1 02/19/2024 1130   RDW 13.8 02/19/2024 1130   RDW 14.8 09/09/2023 1330   LYMPHSABS 1.1 02/19/2024 1130   MONOABS 0.6 02/19/2024 1130   EOSABS 0.0 02/19/2024 1130   BASOSABS 0.0 02/19/2024 1130   .SABRA Lab Results  Component Value Date   RETICCTPCT 1.4 04/24/2018   RBC 3.73 (L) 02/19/2024       Latest Ref Rng & Units 02/19/2024   11:30 AM 02/11/2024    4:34 PM 12/26/2023    8:13 PM  CMP  Glucose 70 - 99  mg/dL 764  897  88   BUN 8 - 23 mg/dL 19  22  26    Creatinine 0.61 - 1.24 mg/dL 8.48  8.67  8.63   Sodium 135 - 145 mmol/L 134  134  134   Potassium 3.5 - 5.1 mmol/L 4.5  4.2  4.2   Chloride 98 - 111 mmol/L 99  98  103   CO2 22 - 32 mmol/L 29  25  27    Calcium  8.9 - 10.3 mg/dL 9.5  9.7  9.3   Total Protein 6.5 - 8.1 g/dL 6.5  6.6  6.0   Total Bilirubin 0.0 - 1.2 mg/dL 0.6  0.5  0.5   Alkaline Phos 38 - 126 U/L 88  84  57   AST 15 - 41 U/L 19  23  20    ALT 0 - 44 U/L 21  24  19   30  . Lab Results  Component Value Date   LDH 162 02/19/2024   Component     Latest Ref Rng & Units 06/13/2017  HCV Ab     0.0 - 0.9 s/co ratio <0.1  Hepatitis B Surface Ag     Negative Negative  Hep B Core Ab, Tot     Negative Negative         RADIOGRAPHIC STUDIES: I have personally reviewed the radiological images as listed and agreed with the findings in the report. CUP PACEART REMOTE DEVICE CHECK Result Date: 02/17/2024 ILR summary report received. Battery status OK. Normal device function. No new symptom, tachy, brady, or pause episodes. No new AF episodes. Monthly summary reports and ROV/PRN ML, CVRS    ASSESSMENT & PLAN:   83 y.o. is a male with  1. Chronic lymphocytic leukemia. Likely Rai Stage 4, CLL FISH panel with 13 q. deletion  -he presented with Lymphocytosis incidentally noted on routine labs No associated significant anemia or thrombocytopenia. No constitutional symptoms No overt clinically palpable LNadenopathy or hepato-splenomegaly. 13q mutation present   08/28/17 CT C/A/P revealed Normal sized spleen, no enlarged lymph nodes. There is evidence of very small lung nodule, likely related to inflammatory process. Will monitor with CT chest in 12 months    2.  Patient Active Problem List   Diagnosis Date Noted   Neutropenia 10/28/2023   Paroxysmal atrial fibrillation (HCC) 05/20/2023   Macrocytic anemia 05/20/2023   Thrombocytopenia 05/20/2023   Cervical spondylosis  with myelopathy and radiculopathy 05/01/2023   Loop recorder: Abbott Assert-IQ 3 Loop recorder. Serial # [id number] 12/04/2022 12/04/2022   Orthostatic hypotension 10/25/2022   Expressive aphasia 10/25/2022   Bacteremia 06/08/2022   Positive blood culture (Enterococcus faecalis) 06/06/2022   CLL (chronic lymphocytic leukemia) (HCC) 06/06/2022   Chronic kidney disease, stage 3a (HCC) 06/06/2022   PAF (paroxysmal atrial fibrillation) (HCC) 06/04/2022   Type 2 diabetes mellitus with complication, without long-term current use of insulin  (HCC) 12/02/2018   Hyperlipidemia associated with type 2 diabetes mellitus (HCC) 12/02/2018   HTN (hypertension) 04/07/2015   Pre-syncope 04/07/2015    PLAN: Labs done today were discussed in detail with the patient CBC shows improved and stable hemoglobin of 11.7, WBC count of 3.5k with a normalized ANC of 1.8k platelets are stable at 71k Patient has had no issues with infections. No abnormal bleeding or bruising. Recent bout of confusion/mumbling thought to be related to a TIA. Has been having paroxysmal A-fib and has been placed back on amiodarone . Tolerating his venetoclax  well at 100 mg p.o. daily with no notable toxicities no nausea no vomiting no diarrhea. Will continue venetoclax  at 100 mg p.o. daily. Patient counseled on need for staying up to speed with his age-appropriate vaccinations. We shall see him back in 3 months Continue follow-up with cardiology and PCP for management of other medical issues  FOLLOW-UP: RTC with Dr Onesimo with labs in 3 months   The total time spent in the appointment was 30 minutes*.  All of the patient's questions were answered with apparent satisfaction. The patient knows to call the clinic with any problems, questions or concerns.   Emaline Onesimo MD MS AAHIVMS [facility] Hematology/Oncology Physician [facility]  .*Total Encounter Time as defined by the Centers for Medicare and Medicaid Services  includes, in addition to the face-to-face time of a patient visit (documented in the note above) non-face-to-face time: obtaining and reviewing outside history, ordering and reviewing medications, tests or procedures, care coordination (communications with other health care professionals or caregivers) and documentation in the medical record.

## 2024-02-20 ENCOUNTER — Other Ambulatory Visit: Payer: Self-pay | Admitting: Hematology

## 2024-02-20 ENCOUNTER — Other Ambulatory Visit: Payer: Self-pay

## 2024-02-20 ENCOUNTER — Other Ambulatory Visit (HOSPITAL_COMMUNITY): Payer: Self-pay

## 2024-02-20 MED ORDER — VENETOCLAX 100 MG PO TABS
100.0000 mg | ORAL_TABLET | Freq: Every day | ORAL | 3 refills | Status: DC
  Filled 2024-02-20: qty 28, 28d supply, fill #0
  Filled 2024-03-13 – 2024-03-16 (×2): qty 28, 28d supply, fill #1
  Filled 2024-04-09: qty 28, 28d supply, fill #2
  Filled 2024-05-08: qty 28, 28d supply, fill #3

## 2024-02-20 NOTE — Progress Notes (Signed)
 Remote Loop Recorder Transmission

## 2024-02-20 NOTE — Progress Notes (Signed)
 Specialty Pharmacy Refill Coordination Note  Eric Harrington is a 83 y.o. male contacted today regarding refills of specialty medication(s) Venetoclax  (VENCLEXTA )   Patient requested Delivery   Delivery date: 02/24/24   Verified address: [address] [place] [address]   Medication will be filled on 02/21/24.   This fill date is pending response to refill request from provider. Patient is aware and if they have not received fill by intended date they must follow up with pharmacy.

## 2024-02-21 ENCOUNTER — Other Ambulatory Visit: Payer: Self-pay

## 2024-02-21 ENCOUNTER — Ambulatory Visit: Payer: Self-pay | Admitting: Cardiology

## 2024-02-21 ENCOUNTER — Encounter: Payer: Self-pay | Admitting: Emergency Medicine

## 2024-02-21 ENCOUNTER — Ambulatory Visit: Attending: Emergency Medicine | Admitting: Emergency Medicine

## 2024-02-21 VITALS — BP 102/56 | HR 74 | Ht 68.0 in | Wt 166.8 lb

## 2024-02-21 DIAGNOSIS — I951 Orthostatic hypotension: Secondary | ICD-10-CM

## 2024-02-21 DIAGNOSIS — I480 Paroxysmal atrial fibrillation: Secondary | ICD-10-CM | POA: Diagnosis not present

## 2024-02-21 DIAGNOSIS — C9110 Chronic lymphocytic leukemia of B-cell type not having achieved remission: Secondary | ICD-10-CM | POA: Diagnosis not present

## 2024-02-21 NOTE — Telephone Encounter (Signed)
   Patient Name: LEJEND DALBY  DOB: 08/04/1940 MRN: [id number]  Primary Cardiologist: Gordy Bergamo, MD  Chart reviewed as part of pre-operative protocol coverage.   Extraction of 3 teeth is of low cardiac risk. It is also generally accepted that for simple extractions and dental cleanings, there is no need to interrupt blood thinner therapy. However, per office protocol, patient can hold Eliquis  for 1 days prior to procedure if requested by dentist.  Patient will not need bridging with Lovenox (enoxaparin) around procedure.   Patient does not require pre-op antibiotics for dental procedure. Given history of Afib, would avoid epinephrine. SBE prophylaxis is not required for the patient from a cardiac standpoint.     I will route this recommendation to the requesting party via Epic fax function and remove from pre-op pool.  Please call with questions.  Lamarr Satterfield, NP 02/21/2024, 3:21 PM

## 2024-02-21 NOTE — Progress Notes (Signed)
 Cardiology Office Note:    Date:  02/23/2024  ID:  [name], DOB 08/18/1940, MRN [id number] PCP: Rolinda Millman, MD  [facility] Providers Cardiologist:  Gordy Bergamo, MD Electrophysiologist:  OLE ONEIDA HOLTS, MD       Patient Profile:       Chief Complaint: ED follow up History of Present Illness:  Eric Harrington is a 83 y.o. male with visit-pertinent history of paroxysmal atrial fibrillation, severe orthostatic hypotension on midodrine  and Florinef  due to near syncopal spells, TIA, CKD stage III, CLL with anemia with chronic thrombocytopenia, hyperlipidemia, hypertension, type 2 diabetes, ACDF on 04/2023, ILR in situ  Eliquis  this discontinued after mutual discussion in view of thrombocytopenia and orthostatic hypotension in May 2024 but presented with TIA with transient aphasia in June 2024 start back on Eliquis  and also amiodarone .  Seen by Dr. Bergamo on 09/09/2023.  Recent and his blood pressure be dropping since fludrocortisone  was discontinued during recent hospitalization when he was hypertensive and in A-fib with RVR and also heart failure.  His wife restarted fludrocortisone  back which started to improve with regard to his energy levels and fatigue.  He was to continue fludrocortisone  daily for another week then switch to every other day if blood pressure stabilizes.  He was last seen in clinic by EP on 12/11/2023.  He was taken off of amiodarone  by ONC due to concern for contribution of blood dyscrasia.  He reports 1 episode on 12/08/2023 where he felt palpitations and thought he was in atrial fibrillation.  He was tired for remainder of the day.  His blood pressure had been better since being on Venclexta  for his CLL.  His wife has been using his midodrine  and Florinef  and a sliding scale pending on his blood pressure.  It was noted that he was previously seen by Dr. HOLTS for catheter ablation and watchman, but most recently not felt to be a candidate due to  thrombocytopenia and anemia.  Only had blood counts significantly improved remains stable could revisit candidacy for watchman.  ILR was reviewed showing no atrial fibrillation.  Most recently he was seen in the ED on 02/11/2024.  Chief complaint of fatigue.  He went to see his dermatologist that day and there was concern for confusion.  His blood pressures were in the 80s to 90s.  He took a midodrine  which seemed to improve his symptoms.  He had not been taking his midodrine  regularly and has not needed it for past few months per his wife.   Discussed the use of AI scribe software for clinical note transcription with the patient, who gave verbal consent to proceed.  History of Present Illness Eric Harrington is an 83 year old male with orthostatic hypotension and atrial fibrillation who presents for follow-up after a recent emergency room visit. He is accompanied by his wife, Eric Harrington.   He recently experienced confusion and stumbling, with a blood pressure of 90/60 mmHg, mumbling speech, and a short-lasting headache. There was no chest pain, shortness of breath, palpitations, syncope, presyncope or lightheadedness.  Orthostatic hypotension is managed with midodrine , 5 mg every morning, with occasional additional doses in the afternoon. Blood pressure fluctuates, with low morning readings and afternoon readings fairly well-controlled.  He is part of a blood pressure monitoring program with his primary care provider.  He restarted amiodarone  on September 24th with oncologist clearance.SABRA He is on anticoagulation therapy.  He denies any symptoms concerning for recurrent atrial fibrillation.  Review of systems:  Please see the history of present illness. All other systems are reviewed and otherwise negative.      Studies Reviewed:        Echocardiogram 05/20/2023  1. Left ventricular ejection fraction, by estimation, is 55 to 60%. The  left ventricle has normal function. The left ventricle has no  regional  wall motion abnormalities. Left ventricular diastolic parameters are  indeterminate.   2. Right ventricular systolic function is normal. The right ventricular  size is normal. There is normal pulmonary artery systolic pressure.   3. The mitral valve is degenerative. Trivial mitral valve regurgitation.  No evidence of mitral stenosis.   4. The aortic valve is tricuspid. Aortic valve regurgitation is trivial.  No aortic stenosis is present.   5. The inferior vena cava is dilated in size with >50% respiratory  variability, suggesting right atrial pressure of 8 mmHg.   Echocardiogram 10/26/2022 1. Left ventricular ejection fraction, by estimation, is 60 to 65%. The  left ventricle has normal function. The left ventricle has no regional  wall motion abnormalities. There is mild concentric left ventricular  hypertrophy. Left ventricular diastolic  parameters are consistent with Grade I diastolic dysfunction (impaired  relaxation).   2. Right ventricular systolic function is normal. The right ventricular  size is normal.   3. Left atrial size was mildly dilated.   4. The mitral valve is abnormal. Mild mitral valve regurgitation. No  evidence of mitral stenosis.   5. The aortic valve is normal in structure. Aortic valve regurgitation is  trivial. No aortic stenosis is present.   6. Aortic dilatation noted. There is borderline dilatation of the aortic  root, measuring 38 mm.   7. The inferior vena cava is normal in size with greater than 50%  respiratory variability, suggesting right atrial pressure of 3 mmHg.  Risk Assessment/Calculations:    CHA2DS2-VASc Score = 6   This indicates a 9.7% annual risk of stroke. The patient's score is based upon: CHF History: 0 HTN History: 1 Diabetes History: 1 Stroke History: 2 Vascular Disease History: 0 Age Score: 2 Gender Score: 0            Physical Exam:   VS:  BP (!) 102/56 (BP Location: Left Arm, Patient Position: Standing,  Cuff Size: Normal)   Pulse 74   Ht 5' 8 (1.727 m)   Wt 166 lb 12.8 oz (75.7 kg)   SpO2 98%   BMI 25.36 kg/m    Wt Readings from Last 3 Encounters:  02/21/24 166 lb 12.8 oz (75.7 kg)  02/19/24 166 lb (75.3 kg)  02/11/24 164 lb 0.4 oz (74.4 kg)    GEN: Well nourished, well developed in no acute distress NECK: No JVD; No carotid bruits CARDIAC: RRR, no murmurs, rubs, gallops RESPIRATORY:  Clear to auscultation without rales, wheezing or rhonchi  ABDOMEN: Soft, non-tender, non-distended EXTREMITIES:  No edema; No acute deformity      Assessment and Plan:  Orthostatic hypotension He experiences weakness and fatigue due to hypotension Likely multifactorial due to CLL and longstanding T2DM Seen in the ED on 9/23 for episode of hypotension associated with fatigue and confusion at the dermatology office with blood pressures in the 80s to 90s systolic.  Symptoms resolved after IV fluid bolus.   Most recent episode likely in the setting of dehydration Currently taking midodrine  5 mg every morning and then as needed in the afternoon and night  - Blood pressure today is 102/56 - Seems he has not taken  fludrocortisone  over the past 4 months - Overall per patient orthostatic hypotension has been very well-controlled over the past several months with just a singular episode most recently.  Will continue midodrine  5 mg in the morning and then as needed.  Education on parameters were given - Continue to monitor for orthostatic blood pressures regularly - Encouraged adequate hydration  Atrial fibrillation CHA2DS2-VASc 6 He was previously seen by Dr. Cindie for consideration of catheter ablation and watchman but felt not to be a candidate due to thrombocytopenia and anemia Plan to follow-up with EP in 02/2024 to review hemoglobin stability and if remains stable for around 6 months will further consider ablation/LAAO Was previously off amiodarone  due to CLL/blood dyscrasia.  However per patient he  restarted on 9/24 after oncology approval - No new A-fib episodes on most recent ILR review 9/28 - Today he is maintaining NSR - Hemoglobin 11.7 on 10/1 and platelet count 71 on 10/1 - Management per EP - Continue Eliquis  5 mg twice daily  CLL Pancytopenia - Management per oncology      Dispo:  Return in about 4 months (around 06/23/2024).  Signed, Lum LITTIE Louis, NP

## 2024-02-21 NOTE — Telephone Encounter (Signed)
 Patient with diagnosis of Afib on Eliquis  for anticoagulation.    Procedure: CLEANING, RADIOGRAPHS, FILLINGS   3 teeth extraction  Date of procedure: TBD   CHA2DS2-VASc Score = 6   This indicates a 9.7% annual risk of stroke. The patient's score is based upon: CHF History: 0 HTN History: 1 Diabetes History: 1 Stroke History: 2 Vascular Disease History: 0 Age Score: 2 Gender Score: 0   CrCl 39 mL/min Platelet count 71 K   Patient has not had an Afib/aflutter ablation or Watchman within the last 3 months or DCCV within the last 30 days   Patient does not require pre-op antibiotics for dental procedure.  Per office protocol, patient can hold Eliquis  for 1 days prior to procedure.   Patient will not need bridging with Lovenox (enoxaparin) around procedure.  **This guidance is not considered finalized until pre-operative APP has relayed final recommendations.**

## 2024-02-21 NOTE — Telephone Encounter (Signed)
 Pharmacy please advise on holding Eliquis  prior to Dental procedure scheduled for TBD. Thank you.    3 teeth being removed. Thanks

## 2024-02-21 NOTE — Patient Instructions (Addendum)
 Medication Instructions:  NO CHANGES  Lab Work: NONE TO BE DONE TODAY.  Testing/Procedures: NONE  Follow-Up: At [facility], you and your health needs are our priority.  As part of our continuing mission to provide you with exceptional heart care, our providers are all part of one team.  This team includes your primary Cardiologist (physician) and Advanced Practice Providers or APPs (Physician Assistants and Nurse Practitioners) who all work together to provide you with the care you need, when you need it.  Your next appointment:   4 MONTHS   Provider:   Gordy Bergamo, MD

## 2024-02-23 ENCOUNTER — Encounter: Payer: Self-pay | Admitting: Emergency Medicine

## 2024-02-25 ENCOUNTER — Encounter: Payer: Self-pay | Admitting: Hematology

## 2024-03-12 NOTE — Progress Notes (Signed)
 Electrophysiology Office Note:   Date:  03/16/2024  ID:  Eric Harrington, DOB Apr 06, 1941, MRN [id number]  Primary Cardiologist: Gordy Bergamo, MD Primary Heart Failure: None Electrophysiologist: OLE ONEIDA HOLTS, MD      History of Present Illness:   Eric Harrington is a 83 y.o. male with h/o  AF, HTN, HLD, orthostatic hypotension, DM, CKD 3a, CLL, thrombocytopenia, & TIA  seen today for routine electrophysiology followup.   Seen in ER on 02/11/24 for one week of fatigue & confusion. He is on midodrine  for orthostatic hypotension. He was treated with IVF and symptoms improved. Infectious work up was negative.    Since last being seen in our clinic the patient reports he & his wife feel the episode above were a TIA. He has had at least one other similar episode. His wife started him back on amiodarone  on 02/12/24 and he is taking 100 mg daily. No episodes of AF (by symptom and ILR).    He denies chest pain, palpitations, dyspnea, PND, orthopnea, nausea, vomiting, dizziness, syncope, edema, weight gain, or early satiety.   Review of systems complete and found to be negative unless listed in HPI.   EP Information / Studies Reviewed:    EKG is not ordered today. EKG from 12/27/23 reviewed which showed SR with RBBB, LAFB 69 bpm        Arrhythmia / AAD / Pertinent EP Studies AF  Amiodarone  > stopped 11/2023 by Dr. Onesimo / Dr. Bergamo due to blood dyscrasia with CLL but was restarted ~02/2024  Device Abbott ILR  implanted 12/04/22 (Dr. Bergamo)    Risk Assessment/Calculations:    CHA2DS2-VASc Score = 6   This indicates a 9.7% annual risk of stroke. The patient's score is based upon: CHF History: 0 HTN History: 1 Diabetes History: 1 Stroke History: 2 Vascular Disease History: 0 Age Score: 2 Gender Score: 0             Physical Exam:   VS:  BP 120/71 (BP Location: Left Arm, Patient Position: Standing, Cuff Size: Normal) Comment: 145/75 L sitting 2nd attempt  Pulse 67 Comment: 70 2nd  attempt  Ht 5' 8.5 (1.74 m)   Wt 167 lb 12.8 oz (76.1 kg)   SpO2 99%   BMI 25.14 kg/m    Wt Readings from Last 3 Encounters:  03/16/24 167 lb 12.8 oz (76.1 kg)  02/21/24 166 lb 12.8 oz (75.7 kg)  02/19/24 166 lb (75.3 kg)     GEN: Well nourished, well developed in no acute distress NECK: No JVD; No carotid bruits CARDIAC: Regular rate and rhythm, no murmurs, rubs, gallops RESPIRATORY:  Clear to auscultation without rales, wheezing or rhonchi  ABDOMEN: Soft, non-tender, non-distended EXTREMITIES:  No edema; No deformity   ASSESSMENT AND PLAN:    Paroxysmal Atrial Fibrillation  High Risk Medication Monitoring: Amiodarone    CHA2DS2-VASc 6 -OAC for stroke prophylaxis  -if Hgb stable, prior plan was to revisit discussion with Dr. Holts regarding LAAO candidacy, however, his platelets remain low.  Given low platelets, will hold further discussion for LAAO -ILR review shows no evidence of AF  -continue amiodarone  100 mg daily, plan for EKG in 3 months and surveillance labs  Secondary Hypercoagulable State  -continue Eliquis  5mg  BID, dose reviewed and appropriate by wt / Cr   Conduction System Disease: RBBB, LAFB -no hx of syncope -no evidence of brady on ILR   Chronic Orthostatic Hypotension  -midodrine  5 mg TID PRN -encourage adequate hydration  -compression stockings  CLL  Pancytopenia  -per ONC  -Hgb stable but platelets remain persistently low    Follow up with Dr. Cindie / EP APP in 3 months  Signed, Daphne Barrack, NP-C, AGACNP-BC [facility] - Electrophysiology  03/16/2024, 4:58 PM

## 2024-03-13 ENCOUNTER — Other Ambulatory Visit (HOSPITAL_COMMUNITY): Payer: Self-pay

## 2024-03-16 ENCOUNTER — Other Ambulatory Visit: Payer: Self-pay

## 2024-03-16 ENCOUNTER — Encounter: Payer: Self-pay | Admitting: Pulmonary Disease

## 2024-03-16 ENCOUNTER — Ambulatory Visit: Attending: Pulmonary Disease | Admitting: Pulmonary Disease

## 2024-03-16 VITALS — BP 120/71 | HR 67 | Ht 68.5 in | Wt 167.8 lb

## 2024-03-16 DIAGNOSIS — I480 Paroxysmal atrial fibrillation: Secondary | ICD-10-CM | POA: Diagnosis not present

## 2024-03-16 DIAGNOSIS — I951 Orthostatic hypotension: Secondary | ICD-10-CM

## 2024-03-16 DIAGNOSIS — D6869 Other thrombophilia: Secondary | ICD-10-CM | POA: Diagnosis not present

## 2024-03-16 DIAGNOSIS — Z95818 Presence of other cardiac implants and grafts: Secondary | ICD-10-CM | POA: Diagnosis not present

## 2024-03-16 DIAGNOSIS — C9110 Chronic lymphocytic leukemia of B-cell type not having achieved remission: Secondary | ICD-10-CM

## 2024-03-16 NOTE — Progress Notes (Signed)
 Specialty Pharmacy Refill Coordination Note  Eric Harrington is a 83 y.o. male contacted today regarding refills of specialty medication(s) Venetoclax  (VENCLEXTA )   Patient requested Delivery   Delivery date: 03/19/24   Verified address: [address] [place] [address]   Medication will be filled on: 03/18/24

## 2024-03-16 NOTE — Patient Instructions (Addendum)
 Medication Instructions:  Your physician recommends that you continue on your current medications as directed. Please refer to the Current Medication list given to you today.  *If you need a refill on your cardiac medications before your next appointment, please call your pharmacy*  Lab Work: None ordered If you have labs (blood work) drawn today and your tests are completely normal, you will receive your results only by: MyChart Message (if you have MyChart) OR A paper copy in the mail If you have any lab test that is abnormal or we need to change your treatment, we will call you to review the results.  Follow-Up: At [facility], you and your health needs are our priority.  As part of our continuing mission to provide you with exceptional heart care, our providers are all part of one team.  This team includes your primary Cardiologist (physician) and Advanced Practice Providers or APPs (Physician Assistants and Nurse Practitioners) who all work together to provide you with the care you need, when you need it.  Your next appointment:   As scheduled

## 2024-03-17 ENCOUNTER — Encounter: Payer: Self-pay | Admitting: Hematology

## 2024-03-17 ENCOUNTER — Telehealth: Payer: Self-pay

## 2024-03-17 ENCOUNTER — Other Ambulatory Visit: Payer: Self-pay

## 2024-03-17 ENCOUNTER — Other Ambulatory Visit (HOSPITAL_COMMUNITY): Payer: Self-pay

## 2024-03-17 NOTE — Progress Notes (Signed)
 Specialty Pharmacy Ongoing Clinical Assessment Note  Eric Harrington is a 83 y.o. male who is being followed by the specialty pharmacy service for RxSp Oncology   Patient's specialty medication(s) reviewed today: Venetoclax  (VENCLEXTA )   Missed doses in the last 4 weeks: 0   Patient/Caregiver did not have any additional questions or concerns.   Therapeutic benefit summary: Patient is achieving benefit   Adverse events/side effects summary: No adverse events/side effects   Patient's therapy is appropriate to: Continue    Goals Addressed             This Visit's Progress    Maintain optimal adherence to therapy   On track    Patient is on track. Patient will maintain adherence.          Follow up: 3 months  Silvano LOISE Dolly Specialty Pharmacist

## 2024-03-17 NOTE — Telephone Encounter (Signed)
 Oral Oncology Patient Advocate Encounter  Was successful in securing patient a $8000 grant from [facility] to provide copayment coverage for Venclexta .  This will keep the out of pocket expense at $0.     [employer] ID: [id number]   The billing information is as follows and has been shared with [facility].    RxBin: [id number] PCN: [id number] Member ID: [id number] Group ID: [id number] Dates of Eligibility: 02/16/24 through 02/14/25  Fund:  CLL  Lucie Lamer, CPhT [place]  [facility] Oncology Pharmacy Patient Advocate Specialist [address] [place] Phone: [phone]  Fax: [phone] [email or web address]

## 2024-03-19 ENCOUNTER — Ambulatory Visit: Payer: Self-pay | Admitting: Cardiology

## 2024-03-19 ENCOUNTER — Ambulatory Visit

## 2024-03-19 DIAGNOSIS — I480 Paroxysmal atrial fibrillation: Secondary | ICD-10-CM | POA: Diagnosis not present

## 2024-03-19 LAB — CUP PACEART REMOTE DEVICE CHECK
Date Time Interrogation Session: 20251030045910
Implantable Pulse Generator Implant Date: 20240716
Pulse Gen Model: 5000
Pulse Gen Serial Number: 511029620

## 2024-03-24 NOTE — Progress Notes (Signed)
 Remote Loop Recorder Transmission

## 2024-04-03 ENCOUNTER — Other Ambulatory Visit: Payer: Self-pay | Admitting: Cardiology

## 2024-04-03 DIAGNOSIS — I480 Paroxysmal atrial fibrillation: Secondary | ICD-10-CM

## 2024-04-03 NOTE — Telephone Encounter (Signed)
 Prescription refill request for Eliquis  received. Indication: a fib Last office visit: 03/16/24 Scr: 1.51  Age: 83 Weight: 76kg  First Scr value >1.5. Will keep patient on 5mg  BID for 3 months until recheck

## 2024-04-05 ENCOUNTER — Other Ambulatory Visit: Payer: Self-pay

## 2024-04-05 ENCOUNTER — Ambulatory Visit
Admission: EM | Admit: 2024-04-05 | Discharge: 2024-04-05 | Disposition: A | Discharge status: Home / Self Care | Attending: Physician Assistant | Admitting: Physician Assistant

## 2024-04-05 DIAGNOSIS — J0100 Acute maxillary sinusitis, unspecified: Secondary | ICD-10-CM | POA: Diagnosis not present

## 2024-04-05 MED ORDER — AMOXICILLIN 500 MG PO CAPS: 500.0000 mg | ORAL_CAPSULE | Freq: Three times a day (TID) | ORAL | 0 refills | Status: AC

## 2024-04-05 NOTE — ED Provider Notes (Signed)
 [facility]    CSN: [id number] Arrival date & time: 04/05/24  1053      History   Chief Complaint Chief Complaint  Patient presents with   Nasal Congestion   Cough    HPI Eric Harrington is a 83 y.o. male.  has a past medical history of Arthritis, Chronic kidney disease, Chronic Leukemia, Diabetes mellitus without complication (HCC), Dysrhythmia, GERD (gastroesophageal reflux disease), History of kidney stones, Hyperlipemia (12/02/2018), Hyperlipidemia, Hypertension, Loop recorder: [employer] Assert-IQ 3 Loop recorder. Serial # [id number] 12/04/2022 (12/04/2022), Pneumonia, Stroke [facility]), and Type 2 diabetes mellitus with complication, without long-term current use of insulin  (HCC) (12/02/2018).   HPI  Pt is here with his wife. He is concerned for a sinus infection  He reports that he has been having a severe cough but took a natural cough remedy last night and this seemed to make it better today.  He states this AM he started having green mucus from the productive cough. He reports minor sore throat yesterday and his wife states that last night he was having some SOB.  He has been taking Zyrtec for allergies and has been using Tylenol  and cough drops to help with symptoms He reports his grandchildren had runny noses last week but no fevers when he was around them and denies other recent sick contacts.  His wife states he can have Amoxicillin  but not augmentin - reports he took it last year without issues.     Past Medical History:  Diagnosis Date   Arthritis    Chronic kidney disease    CKD3   Chronic Leukemia    Diabetes mellitus without complication (HCC)    Dysrhythmia    A. Fib   GERD (gastroesophageal reflux disease)    History of kidney stones    Hyperlipemia 12/02/2018   Hyperlipidemia    Hypertension    Hx. Now has lower BP issues. On midodrine    Loop recorder: [employer] Assert-IQ 3 Loop recorder. Serial # [id number] 12/04/2022 12/04/2022   Pneumonia     Stroke [facility])    TIA x2 since Dec 2023. No deficits   Type 2 diabetes mellitus with complication, without long-term current use of insulin  (HCC) 12/02/2018    Patient Active Problem List   Diagnosis Date Noted   Neutropenia 10/28/2023   Paroxysmal atrial fibrillation (HCC) 05/20/2023   Macrocytic anemia 05/20/2023   Thrombocytopenia 05/20/2023   Cervical spondylosis with myelopathy and radiculopathy 05/01/2023   Loop recorder: [employer] Assert-IQ 3 Loop recorder. Serial # [id number] 12/04/2022 12/04/2022   Orthostatic hypotension 10/25/2022   Expressive aphasia 10/25/2022   Bacteremia 06/08/2022   Positive blood culture (Enterococcus faecalis) 06/06/2022   CLL (chronic lymphocytic leukemia) (HCC) 06/06/2022   Chronic kidney disease, stage 3a (HCC) 06/06/2022   PAF (paroxysmal atrial fibrillation) (HCC) 06/04/2022   Type 2 diabetes mellitus with complication, without long-term current use of insulin  (HCC) 12/02/2018   Hyperlipidemia associated with type 2 diabetes mellitus (HCC) 12/02/2018   HTN (hypertension) 04/07/2015   Pre-syncope 04/07/2015    Past Surgical History:  Procedure Laterality Date   ANTERIOR CERVICAL DECOMP/DISCECTOMY FUSION N/A 05/01/2023   Procedure: Anterior Cervical Decompression/ Discectomy Fusion Cervical Four-Cervical Five - Cervical Five-Cervical Six;  Surgeon: Louis Shove, MD;  Location: [facility] OR;  Service: Neurosurgery;  Laterality: N/A;  3C   CARDIAC CATHETERIZATION  2017   CATARACT EXTRACTION W/ INTRAOCULAR LENS IMPLANT Bilateral    COLONOSCOPY     VENTRAL HERNIA REPAIR  Home Medications    Prior to Admission medications   Medication Sig Start Date End Date Taking? Authorizing Provider  amoxicillin  (AMOXIL ) 500 MG capsule Take 1 capsule (500 mg total) by mouth 3 (three) times daily for 5 days. 04/05/24 04/10/24 Yes Shay Jhaveri E, PA-C  acetaminophen  (TYLENOL ) 500 MG tablet Take 1,000 mg by mouth every 8 (eight) hours as needed for moderate pain,  fever or headache.    [provider]  amiodarone  (PACERONE ) 200 MG tablet Take 100 mg by mouth daily. 05/24/23   [provider]  apixaban  (ELIQUIS ) 5 MG TABS tablet Take 1 tablet by mouth twice daily 04/03/24   Ganji, Jay, MD  ASCORBIC ACID  PO Take 1 tablet by mouth daily. Vitamin C .    [provider]  cetirizine (ZYRTEC) 10 MG tablet Take 10 mg by mouth daily as needed for allergies.    [provider]  Cholecalciferol  (VITAMIN D -3 PO) Take 1 capsule by mouth daily.    [provider]  Cyanocobalamin  (VITAMIN B12) 1000 MCG TBCR Take 1 tablet by mouth daily.    [provider]  docusate sodium  (COLACE) 100 MG capsule Take 100 mg by mouth daily.     [provider]  esomeprazole (NEXIUM 24HR) 20 MG capsule Take 20 mg by mouth daily at 12 noon. Patient taking differently: Take 20 mg by mouth as needed.    [provider]  fexofenadine (ALLEGRA) 180 MG tablet Take 180 mg by mouth daily. Patient taking differently: Take 180 mg by mouth as needed for allergies or rhinitis.    [provider]  FIBER PO Take 3 tablets by mouth daily.    [provider]  fludrocortisone  (FLORINEF ) 0.1 MG tablet Take 1 tablet (100 mcg total) by mouth every evening. Patient taking differently: Take 100 mcg by mouth as needed. 09/10/23   Ladona Heinz, MD  fluticasone  (FLONASE ) 50 MCG/ACT nasal spray Place 1 spray into both nostrils daily as needed for allergies.    [provider]  glipiZIDE (GLUCOTROL XL) 2.5 MG 24 hr tablet Take 2.5 mg by mouth daily with breakfast. 11/20/23   [provider]  midodrine  (PROAMATINE ) 10 MG tablet Take 1 tablet (10 mg total) by mouth 3 (three) times daily with meals. While awake. Do not lay down for 3-4 hours after taking 07/31/22   Ladona Heinz, MD  Multiple Vitamins-Minerals (ZINC PO) Take 1 tablet by mouth daily.    [provider]  ondansetron  (ZOFRAN ) 4 MG tablet Take 1 tablet  (4 mg total) by mouth every 8 (eight) hours as needed for nausea or vomiting. 09/30/23   Onesimo Emaline Brink, MD  Polyethyl Glycol-Propyl Glycol (SYSTANE OP) Place 1 drop into both eyes 2 (two) times daily as needed (dryness, irritation.).    [provider]  rosuvastatin  (CRESTOR ) 5 MG tablet Take 5 mg by mouth every evening.    [provider]  venetoclax  (VENCLEXTA ) 100 MG tablet Take 1 tablet (100 mg total) by mouth daily. Tablets should be swallowed whole with a meal and a full glass of water. 02/20/24   Onesimo Emaline Brink, MD    Family History Family History  Problem Relation Age of Onset   Lung cancer Mother    Heart attack Father    Dementia Sister    Diabetes Brother    Diabetes Sister    Heart attack Brother     Social History Social History   Tobacco Use   Smoking status: Former  Current packs/day: 0.00    Average packs/day: 1 pack/day for 30.0 years (30.0 ttl pk-yrs)    Types: Cigarettes    Start date: 30    Quit date: 29    Years since quitting: 45.9   Smokeless tobacco: Never   Tobacco comments:    started at age 52  Vaping Use   Vaping status: Never Used  Substance Use Topics   Alcohol use: Never   Drug use: Not Currently     Allergies   Norvasc [amlodipine besylate], Augmentin [amoxicillin -pot clavulanate], Bystolic [nebivolol hcl], Coq10 [coenzyme q10], Cozaar [losartan], Farxiga [dapagliflozin], Glucotrol [glipizide], Januvia [sitagliptin], Keflex [cephalexin], Lipitor [atorvastatin calcium ], Lipitor [atorvastatin], Lopid [gemfibrozil], Metformin , Omnicef [cefdinir], Prilosec [omeprazole], Toprol  xl [metoprolol ], Vibra-tab [doxycycline], Zestril [lisinopril], Zetia [ezetimibe], Zocor [simvastatin], and Neurontin [gabapentin]   Review of Systems Review of Systems  Constitutional:  Negative for chills and fever.  HENT:  Positive for congestion, rhinorrhea and sore throat. Negative for ear pain.   Respiratory:  Positive for cough  and shortness of breath.   Gastrointestinal:  Negative for diarrhea, nausea and vomiting.  Musculoskeletal:  Positive for myalgias (chronic).     Physical Exam Triage Vital Signs ED Triage Vitals  Encounter Vitals Group     BP 04/05/24 1104 (!) 101/59     Girls Systolic BP Percentile --      Girls Diastolic BP Percentile --      Boys Systolic BP Percentile --      Boys Diastolic BP Percentile --      Pulse Rate 04/05/24 1104 78     Resp 04/05/24 1104 18     Temp 04/05/24 1104 (!) 97.5 F (36.4 C)     Temp Source 04/05/24 1104 Oral     SpO2 04/05/24 1104 98 %     Weight 04/05/24 1120 166 lb (75.3 kg)     Height 04/05/24 1120 5' 8.5 (1.74 m)     Head Circumference --      Peak Flow --      Pain Score 04/05/24 1119 8     Pain Loc --      Pain Education --      Exclude from Growth Chart --    No data found.  Updated Vital Signs BP (!) 101/59 (BP Location: Left Arm)   Pulse 78   Temp (!) 97.5 F (36.4 C) (Oral)   Resp 18   Ht 5' 8.5 (1.74 m)   Wt 166 lb (75.3 kg)   SpO2 98%   BMI 24.87 kg/m   Visual Acuity Right Eye Distance:   Left Eye Distance:   Bilateral Distance:    Right Eye Near:   Left Eye Near:    Bilateral Near:     Physical Exam Vitals reviewed.  Constitutional:      General: He is awake. He is not in acute distress.    Appearance: Normal appearance. He is well-developed and well-groomed. He is not ill-appearing, toxic-appearing or diaphoretic.  HENT:     Head: Normocephalic and atraumatic.     Right Ear: Hearing, tympanic membrane and ear canal normal.     Left Ear: Hearing, tympanic membrane and ear canal normal.     Mouth/Throat:     Lips: Pink.     Mouth: Mucous membranes are moist.     Pharynx: Oropharynx is clear. Uvula midline. No pharyngeal swelling, oropharyngeal exudate, posterior oropharyngeal erythema, uvula swelling or postnasal drip.     Tonsils: No tonsillar exudate or tonsillar abscesses.  Eyes:     General: Lids are normal.  Gaze aligned appropriately.     Extraocular Movements: Extraocular movements intact.  Cardiovascular:     Rate and Rhythm: Normal rate and regular rhythm.     Heart sounds: Normal heart sounds.  Pulmonary:     Effort: Pulmonary effort is normal.     Breath sounds: Normal breath sounds. No decreased air movement. No decreased breath sounds, wheezing, rhonchi or rales.  Musculoskeletal:     Cervical back: Normal range of motion and neck supple.  Lymphadenopathy:     Head:     Right side of head: No submental, submandibular or preauricular adenopathy.     Left side of head: No submental, submandibular or preauricular adenopathy.     Cervical:     Right cervical: No superficial cervical adenopathy.    Left cervical: No superficial cervical adenopathy.     Upper Body:     Right upper body: No supraclavicular adenopathy.     Left upper body: No supraclavicular adenopathy.  Skin:    General: Skin is warm and dry.  Neurological:     General: No focal deficit present.     Mental Status: He is alert and oriented to person, place, and time.  Psychiatric:        Mood and Affect: Mood normal.        Behavior: Behavior normal. Behavior is cooperative.        Thought Content: Thought content normal.        Judgment: Judgment normal.      UC Treatments / Results  Labs (all labs ordered are listed, but only abnormal results are displayed) Labs Reviewed - No data to display  EKG   Radiology No results found.  Procedures Procedures (including critical care time)  Medications Ordered in UC Medications - No data to display  Initial Impression / Assessment and Plan / UC Course  I have reviewed the triage vital signs and the nursing notes.  Pertinent labs & imaging results that were available during my care of the patient were reviewed by me and considered in my medical decision making (see chart for details).      Final Clinical Impressions(s) / UC Diagnoses   Final diagnoses:   Acute maxillary sinusitis, recurrence not specified   Patient presents today with concerns for persistent nasal congestion, runny nose, cough and intermittent shortness of breath.  He reports minimal improvement with taking Zyrtec and Tylenol  as well as cough drops for symptoms.  He and his wife report that he has had symptoms for about 10 days but over the last 3 days they have become more severe and noticeable.  Physical exam is largely reassuring but patient does have potential contributory past medical history of CLL, chronic kidney disease, diabetes which may make him more susceptible to severe illness.  Will start on amoxicillin  500 mg p.o. 3 times daily x 5 days for suspected sinusitis.  Recommend continued use of OTC medications for further symptomatic relief.  ED and return precautions reviewed and provided in AVS.  Follow-up as needed.    Discharge Instructions      Based on your symptoms and duration of illness, I believe you may have a bacterial sinus infection  These typically resolve with antibiotic therapy along with at-home comfort measures  Today I have sent in a prescription for Amoxicillin  500 mg to be taken by mouth 3 times per day for 5 days.  FINISH THE ENTIRE COURSE unless you  develop an allergic reaction or are instructed to discontinue.  It can take a few days for the antibiotic to kick in so I recommend symptomatic relief with over the counter medication such as the following: Dayquil/ Nyquil Theraflu Alkaseltzer   If you have high blood pressure I recommend that you take Mucinex , Robitussin, Tylenol  instead of the combination medications.  Combination medications typically have a decongestant in them that can cause your blood pressure to be high.  These medications typically have Tylenol  in them already so you do not need to supplement with more outside of those medications  Stay well hydrated with at least 75 oz of water per day to help with recovery  If at any  point you start to develop swelling around the eyes and nose, trouble seeing, fevers that are not responding to medications, trouble breathing, passing out or headaches that are very severe please go to the emergency room for further evaluation management      ED Prescriptions     Medication Sig Dispense Auth. Provider   amoxicillin  (AMOXIL ) 500 MG capsule Take 1 capsule (500 mg total) by mouth 3 (three) times daily for 5 days. 15 capsule Kaveri Perras E, PA-C      PDMP not reviewed this encounter.   Marylene Rocky BRAVO, PA-C 04/05/24 1624

## 2024-04-05 NOTE — Discharge Instructions (Addendum)
 Based on your symptoms and duration of illness, I believe you may have a bacterial sinus infection  These typically resolve with antibiotic therapy along with at-home comfort measures  Today I have sent in a prescription for Amoxicillin  500 mg to be taken by mouth 3 times per day for 5 days.  FINISH THE ENTIRE COURSE unless you develop an allergic reaction or are instructed to discontinue.  It can take a few days for the antibiotic to kick in so I recommend symptomatic relief with over the counter medication such as the following: Dayquil/ Nyquil Theraflu Alkaseltzer   If you have high blood pressure I recommend that you take Mucinex , Robitussin, Tylenol  instead of the combination medications.  Combination medications typically have a decongestant in them that can cause your blood pressure to be high.  These medications typically have Tylenol  in them already so you do not need to supplement with more outside of those medications  Stay well hydrated with at least 75 oz of water per day to help with recovery  If at any point you start to develop swelling around the eyes and nose, trouble seeing, fevers that are not responding to medications, trouble breathing, passing out or headaches that are very severe please go to the emergency room for further evaluation management

## 2024-04-05 NOTE — ED Triage Notes (Signed)
 Pt presents with complaints of nasal congestion and cough x 1 week. Feels like the congestion has went to his chest. Only feels SOB when coughing. No pain related to this issue. Experiences chronic back pain when standing. Takes Zyrtec daily. Has been using natural cough medicine with no improvement. Hx of leukemia and low blood pressure.

## 2024-04-09 ENCOUNTER — Other Ambulatory Visit: Payer: Self-pay

## 2024-04-09 ENCOUNTER — Telehealth: Payer: Self-pay | Admitting: Cardiology

## 2024-04-09 ENCOUNTER — Other Ambulatory Visit (HOSPITAL_COMMUNITY): Payer: Self-pay

## 2024-04-09 DIAGNOSIS — I951 Orthostatic hypotension: Secondary | ICD-10-CM

## 2024-04-09 MED ORDER — MIDODRINE HCL 5 MG PO TABS: 5.0000 mg | ORAL_TABLET | Freq: Three times a day (TID) | ORAL | 4 refills | Status: AC | PRN

## 2024-04-09 NOTE — Progress Notes (Signed)
 Per Daphne Barrack, NP last office note : midodrine  5 mg TID PRN

## 2024-04-09 NOTE — Telephone Encounter (Signed)
 Returned pt's call. Pt stated they had already spoke with someone.

## 2024-04-09 NOTE — Telephone Encounter (Signed)
 Pt c/o medication issue:  1. Name of Medication: midodrine  (PROAMATINE ) 10 MG tablet   2. How are you currently taking this medication (dosage and times per day)? No   3. Are you having a reaction (difficulty breathing--STAT)? No   4. What is your medication issue? Pt spouse called in to see if the Dr could prescribe 5 mg tablets    Pt spouse would like a c/b please advise

## 2024-04-14 ENCOUNTER — Other Ambulatory Visit: Payer: Self-pay

## 2024-04-14 ENCOUNTER — Other Ambulatory Visit (HOSPITAL_COMMUNITY): Payer: Self-pay

## 2024-04-14 NOTE — Progress Notes (Signed)
 Specialty Pharmacy Ongoing Clinical Assessment Note  Eric Harrington is a 83 y.o. male who is being followed by the specialty pharmacy service for RxSp Oncology   Patient's specialty medication(s) reviewed today: Venetoclax  (VENCLEXTA )   Missed doses in the last 4 weeks: 0   Patient/Caregiver did not have any additional questions or concerns.   Therapeutic benefit summary: Patient is achieving benefit   Adverse events/side effects summary: No adverse events/side effects   Patient's therapy is appropriate to: Continue    Goals Addressed             This Visit's Progress    Maintain optimal adherence to therapy   On track    Patient is on track. Patient will maintain adherence.          Follow up: 3 months  Silvano LOISE Dolly Specialty Pharmacist

## 2024-04-14 NOTE — Progress Notes (Signed)
 Specialty Pharmacy Refill Coordination Note  Eric Harrington is a 83 y.o. male contacted today regarding refills of specialty medication(s) Venetoclax  (VENCLEXTA )   Patient requested Delivery   Delivery date: 04/20/24   Verified address: [address] [place] [address]   Medication will be filled on: 04/17/24

## 2024-04-20 ENCOUNTER — Ambulatory Visit

## 2024-04-20 DIAGNOSIS — I480 Paroxysmal atrial fibrillation: Secondary | ICD-10-CM | POA: Diagnosis not present

## 2024-04-20 LAB — CUP PACEART REMOTE DEVICE CHECK
Date Time Interrogation Session: 20251201054519
Implantable Pulse Generator Implant Date: 20240716
Pulse Gen Model: 5000
Pulse Gen Serial Number: 511029620

## 2024-04-24 NOTE — Progress Notes (Signed)
 Remote Loop Recorder Transmission

## 2024-05-07 ENCOUNTER — Ambulatory Visit: Payer: Self-pay | Admitting: Cardiology

## 2024-05-08 ENCOUNTER — Other Ambulatory Visit (HOSPITAL_COMMUNITY): Payer: Self-pay

## 2024-05-11 ENCOUNTER — Other Ambulatory Visit: Payer: Self-pay

## 2024-05-11 ENCOUNTER — Other Ambulatory Visit (HOSPITAL_COMMUNITY): Payer: Self-pay

## 2024-05-11 NOTE — Progress Notes (Signed)
 Specialty Pharmacy Refill Coordination Note  Eric Harrington is a 83 y.o. male contacted today regarding refills of specialty medication(s) Venetoclax  (VENCLEXTA )   Patient requested Delivery   Delivery date: 05/15/24   Verified address: [address] [place] [address]   Medication will be filled on: 05/13/24

## 2024-05-13 ENCOUNTER — Other Ambulatory Visit: Payer: Self-pay

## 2024-05-21 ENCOUNTER — Ambulatory Visit

## 2024-05-21 DIAGNOSIS — I480 Paroxysmal atrial fibrillation: Secondary | ICD-10-CM | POA: Diagnosis not present

## 2024-05-22 ENCOUNTER — Other Ambulatory Visit: Payer: Self-pay | Admitting: Cardiology

## 2024-05-22 DIAGNOSIS — I480 Paroxysmal atrial fibrillation: Secondary | ICD-10-CM

## 2024-05-22 LAB — CUP PACEART REMOTE DEVICE CHECK
Date Time Interrogation Session: 20260101050408
Implantable Pulse Generator Implant Date: 20240716
Pulse Gen Model: 5000
Pulse Gen Serial Number: 511029620

## 2024-05-23 ENCOUNTER — Ambulatory Visit: Payer: Self-pay | Admitting: Cardiology

## 2024-05-26 NOTE — Progress Notes (Signed)
 Remote Loop Recorder Transmission

## 2024-05-29 ENCOUNTER — Other Ambulatory Visit: Payer: Self-pay

## 2024-05-29 DIAGNOSIS — D649 Anemia, unspecified: Secondary | ICD-10-CM

## 2024-05-29 DIAGNOSIS — C9110 Chronic lymphocytic leukemia of B-cell type not having achieved remission: Secondary | ICD-10-CM

## 2024-06-01 ENCOUNTER — Inpatient Hospital Stay: Admitting: Hematology

## 2024-06-01 ENCOUNTER — Inpatient Hospital Stay: Attending: Hematology

## 2024-06-01 ENCOUNTER — Encounter: Payer: Self-pay | Admitting: Hematology

## 2024-06-01 VITALS — BP 115/59 | HR 66 | Temp 98.1°F | Resp 18 | Wt 170.1 lb

## 2024-06-01 DIAGNOSIS — C9110 Chronic lymphocytic leukemia of B-cell type not having achieved remission: Secondary | ICD-10-CM

## 2024-06-01 DIAGNOSIS — G629 Polyneuropathy, unspecified: Secondary | ICD-10-CM | POA: Diagnosis not present

## 2024-06-01 DIAGNOSIS — E785 Hyperlipidemia, unspecified: Secondary | ICD-10-CM | POA: Insufficient documentation

## 2024-06-01 DIAGNOSIS — E119 Type 2 diabetes mellitus without complications: Secondary | ICD-10-CM | POA: Diagnosis not present

## 2024-06-01 DIAGNOSIS — Z7984 Long term (current) use of oral hypoglycemic drugs: Secondary | ICD-10-CM | POA: Diagnosis not present

## 2024-06-01 DIAGNOSIS — R053 Chronic cough: Secondary | ICD-10-CM | POA: Insufficient documentation

## 2024-06-01 DIAGNOSIS — I4891 Unspecified atrial fibrillation: Secondary | ICD-10-CM | POA: Diagnosis not present

## 2024-06-01 DIAGNOSIS — R1030 Lower abdominal pain, unspecified: Secondary | ICD-10-CM | POA: Insufficient documentation

## 2024-06-01 DIAGNOSIS — R0982 Postnasal drip: Secondary | ICD-10-CM | POA: Diagnosis not present

## 2024-06-01 DIAGNOSIS — N183 Chronic kidney disease, stage 3 unspecified: Secondary | ICD-10-CM | POA: Insufficient documentation

## 2024-06-01 DIAGNOSIS — I480 Paroxysmal atrial fibrillation: Secondary | ICD-10-CM | POA: Insufficient documentation

## 2024-06-01 DIAGNOSIS — Z79899 Other long term (current) drug therapy: Secondary | ICD-10-CM | POA: Insufficient documentation

## 2024-06-01 DIAGNOSIS — Z7969 Long term (current) use of other immunomodulators and immunosuppressants: Secondary | ICD-10-CM | POA: Insufficient documentation

## 2024-06-01 DIAGNOSIS — D649 Anemia, unspecified: Secondary | ICD-10-CM

## 2024-06-01 DIAGNOSIS — Z7901 Long term (current) use of anticoagulants: Secondary | ICD-10-CM | POA: Insufficient documentation

## 2024-06-01 DIAGNOSIS — Z87442 Personal history of urinary calculi: Secondary | ICD-10-CM | POA: Insufficient documentation

## 2024-06-01 DIAGNOSIS — I129 Hypertensive chronic kidney disease with stage 1 through stage 4 chronic kidney disease, or unspecified chronic kidney disease: Secondary | ICD-10-CM | POA: Insufficient documentation

## 2024-06-01 DIAGNOSIS — Z8673 Personal history of transient ischemic attack (TIA), and cerebral infarction without residual deficits: Secondary | ICD-10-CM | POA: Diagnosis not present

## 2024-06-01 DIAGNOSIS — Z87891 Personal history of nicotine dependence: Secondary | ICD-10-CM | POA: Insufficient documentation

## 2024-06-01 DIAGNOSIS — Z8701 Personal history of pneumonia (recurrent): Secondary | ICD-10-CM | POA: Diagnosis not present

## 2024-06-01 LAB — CMP (CANCER CENTER ONLY)
ALT: 20 U/L (ref 0–44)
AST: 21 U/L (ref 15–41)
Albumin: 4.7 g/dL (ref 3.5–5.0)
Alkaline Phosphatase: 90 U/L (ref 38–126)
Anion gap: 10 (ref 5–15)
BUN: 21 mg/dL (ref 8–23)
CO2: 26 mmol/L (ref 22–32)
Calcium: 9.8 mg/dL (ref 8.9–10.3)
Chloride: 98 mmol/L (ref 98–111)
Creatinine: 1.47 mg/dL — ABNORMAL HIGH (ref 0.61–1.24)
GFR, Estimated: 48 mL/min — ABNORMAL LOW
Glucose, Bld: 164 mg/dL — ABNORMAL HIGH (ref 70–99)
Potassium: 4.5 mmol/L (ref 3.5–5.1)
Sodium: 134 mmol/L — ABNORMAL LOW (ref 135–145)
Total Bilirubin: 0.6 mg/dL (ref 0.0–1.2)
Total Protein: 7 g/dL (ref 6.5–8.1)

## 2024-06-01 LAB — CBC WITH DIFFERENTIAL (CANCER CENTER ONLY)
Abs Immature Granulocytes: 0.04 K/uL (ref 0.00–0.07)
Basophils Absolute: 0 K/uL (ref 0.0–0.1)
Basophils Relative: 0 %
Eosinophils Absolute: 0 K/uL (ref 0.0–0.5)
Eosinophils Relative: 0 %
HCT: 33.8 % — ABNORMAL LOW (ref 39.0–52.0)
Hemoglobin: 12 g/dL — ABNORMAL LOW (ref 13.0–17.0)
Immature Granulocytes: 1 %
Lymphocytes Relative: 25 %
Lymphs Abs: 1.2 K/uL (ref 0.7–4.0)
MCH: 32.2 pg (ref 26.0–34.0)
MCHC: 35.5 g/dL (ref 30.0–36.0)
MCV: 90.6 fL (ref 80.0–100.0)
Monocytes Absolute: 0.7 K/uL (ref 0.1–1.0)
Monocytes Relative: 14 %
Neutro Abs: 2.9 K/uL (ref 1.7–7.7)
Neutrophils Relative %: 60 %
Platelet Count: 86 K/uL — ABNORMAL LOW (ref 150–400)
RBC: 3.73 MIL/uL — ABNORMAL LOW (ref 4.22–5.81)
RDW: 13.9 % (ref 11.5–15.5)
WBC Count: 4.7 K/uL (ref 4.0–10.5)
nRBC: 0 % (ref 0.0–0.2)

## 2024-06-01 LAB — SAMPLE TO BLOOD BANK

## 2024-06-01 LAB — LACTATE DEHYDROGENASE: LDH: 179 U/L (ref 105–235)

## 2024-06-01 NOTE — Progress Notes (Signed)
 " HEMATOLOGY ONCOLOGY PROGRESS NOTE  Date of service: 06/01/2024  Patient Care Team: Rolinda Millman, MD as PCP - General (Family Medicine) Ladona Heinz, MD as PCP - Cardiology (Cardiology) Cindie Ole DASEN, MD (Inactive) as PCP - Electrophysiology (Cardiology) Onesimo Emaline Brink, MD as Consulting Physician (Hematology)  CHIEF COMPLAINT/PURPOSE OF CONSULTATION: Follow-up for continued evaluation and management of CLL  HISTORY OF PRESENTING ILLNESS:  Eric Harrington 84 y.o. male is here because of a referral from Dr. Camille from [facility] regarding a trend in his elevated WBC.    He is accompanied today by his wife of 54 years. The pt reports that he is doing well overall. He recently had a biopsy to check his prostate at [facility] with Dr. Medford Han and they took 12 core samples with reportedly no significant findings.   Of note prior to the patient's visit today, pt has had CBC completed on 04/26/17 with results revealing WBC at 14.7, Hgb at 12.6, Lymph Abs at 11.0 with all other values WNL. On 03/26/17 his WBC were 12.8, Hgb at 13.3 and Lymph's at  9.90. On 02/06/17 his WBC were 12.7, Hgb at 13.4, and Lymph's at 8.80.    On review of systems, pt reports post nasal drip, persistent cough, occasional troublesome stomach, pain in his lower abdomen that feels like his muscles are irritated, reports he has lost 25 lbs in the last 2.5 years (he associates a decreased appetite after taking beta blockers during this period) and denies no recent colds or infections, acute changes in energy levels, and leg swelling.    On PMHx the pt reports taking Zetia. He has IB'S and has had 3 colonoscopies with no significant findings or inflammatory processes. He reports that over the last 2.5 years he had heart catheterizations that all turned out well. He reports having diabetes. He also reports neuropathy along his whole right side that lasted for a few months that ceased abruptly  SUMMARY  OF ONCOLOGIC HISTORY: Oncology History   No problem history exists.    INTERVAL HISTORY: Eric Harrington is a 84 y.o. male who is here today for continued evaluation and management of CLL. He is accompanied by his wife.  he was last seen by me on 02/19/2024; at the time his wife speculated a possible TIA.   Today, he notes that he is still experiencing intense sweating episodes throughout the day. He states that he feels fine until he begins to sweat. His wife states this happens several times daily. She also states that is getting worse and more frequent.   Denies recent infections.  He is no longer on metformin .  REVIEW OF SYSTEMS:   10 Point review of systems of done and is negative except as noted above.  MEDICAL HISTORY Past Medical History:  Diagnosis Date   Arthritis    Chronic kidney disease    CKD3   Chronic Leukemia    Diabetes mellitus without complication (HCC)    Dysrhythmia    A. Fib   GERD (gastroesophageal reflux disease)    History of kidney stones    Hyperlipemia 12/02/2018   Hyperlipidemia    Hypertension    Hx. Now has lower BP issues. On midodrine    Loop recorder: Abbott Assert-IQ 3 Loop recorder. Serial # [id number] 12/04/2022 12/04/2022   Pneumonia    Stroke [facility])    TIA x2 since Dec 2023. No deficits   Type 2 diabetes mellitus with complication, without long-term current use of  insulin  (HCC) 12/02/2018    SURGICAL HISTORY Past Surgical History:  Procedure Laterality Date   ANTERIOR CERVICAL DECOMP/DISCECTOMY FUSION N/A 05/01/2023   Procedure: Anterior Cervical Decompression/ Discectomy Fusion Cervical Four-Cervical Five - Cervical Five-Cervical Six;  Surgeon: Louis Shove, MD;  Location: [facility] OR;  Service: Neurosurgery;  Laterality: N/A;  3C   CARDIAC CATHETERIZATION  2017   CATARACT EXTRACTION W/ INTRAOCULAR LENS IMPLANT Bilateral    COLONOSCOPY     VENTRAL HERNIA REPAIR      SOCIAL HISTORY Social History[1]  Social History   Social History  Narrative   2 Cups of Coffee   Right Handed     SOCIAL DRIVERS OF HEALTH SDOH Screenings   Food Insecurity: No Food Insecurity (05/20/2023)  Housing: Low Risk (05/20/2023)  Transportation Needs: No Transportation Needs (05/20/2023)  Utilities: Not At Risk (05/20/2023)  Alcohol Screen: Low Risk (05/20/2023)  Depression (PHQ2-9): Low Risk (05/20/2023)  Financial Resource Strain: Low Risk (05/20/2023)  Physical Activity: Inactive (05/20/2023)  Social Connections: Moderately Integrated (05/20/2023)  Stress: No Stress Concern Present (05/20/2023)  Tobacco Use: Medium Risk (04/05/2024)  Health Literacy: Adequate Health Literacy (05/20/2023)     FAMILY HISTORY Family History  Problem Relation Age of Onset   Lung cancer Mother    Heart attack Father    Dementia Sister    Diabetes Brother    Diabetes Sister    Heart attack Brother      ALLERGIES: is allergic to norvasc [amlodipine besylate], augmentin [amoxicillin -pot clavulanate], bystolic [nebivolol hcl], coq10 [coenzyme q10], cozaar [losartan], farxiga [dapagliflozin], glucotrol [glipizide], januvia [sitagliptin], keflex [cephalexin], lipitor [atorvastatin calcium ], lipitor [atorvastatin], lopid [gemfibrozil], metformin , omnicef [cefdinir], prilosec [omeprazole], toprol  xl [metoprolol ], vibra-tab [doxycycline], zestril [lisinopril], zetia [ezetimibe], zocor [simvastatin], and neurontin [gabapentin].  MEDICATIONS  Current Outpatient Medications  Medication Sig Dispense Refill   acetaminophen  (TYLENOL ) 500 MG tablet Take 1,000 mg by mouth every 8 (eight) hours as needed for moderate pain, fever or headache.     amiodarone  (PACERONE ) 200 MG tablet Take 0.5 tablets (100 mg total) by mouth daily. 45 tablet 2   apixaban  (ELIQUIS ) 5 MG TABS tablet Take 1 tablet by mouth twice daily 180 tablet 0   ASCORBIC ACID  PO Take 1 tablet by mouth daily. Vitamin C .     cetirizine (ZYRTEC) 10 MG tablet Take 10 mg by mouth daily as needed for  allergies.     Cholecalciferol  (VITAMIN D -3 PO) Take 1 capsule by mouth daily.     Cyanocobalamin  (VITAMIN B12) 1000 MCG TBCR Take 1 tablet by mouth daily.     docusate sodium  (COLACE) 100 MG capsule Take 100 mg by mouth daily.      esomeprazole (NEXIUM 24HR) 20 MG capsule Take 20 mg by mouth daily at 12 noon. (Patient taking differently: Take 20 mg by mouth as needed.)     fexofenadine (ALLEGRA) 180 MG tablet Take 180 mg by mouth daily. (Patient taking differently: Take 180 mg by mouth as needed for allergies or rhinitis.)     FIBER PO Take 3 tablets by mouth daily.     fludrocortisone  (FLORINEF ) 0.1 MG tablet Take 1 tablet (100 mcg total) by mouth every evening. (Patient taking differently: Take 100 mcg by mouth as needed.) 90 tablet 1   fluticasone  (FLONASE ) 50 MCG/ACT nasal spray Place 1 spray into both nostrils daily as needed for allergies.     glipiZIDE (GLUCOTROL XL) 2.5 MG 24 hr tablet Take 2.5 mg by mouth daily with breakfast.     midodrine  (PROAMATINE ) 5  MG tablet Take 1 tablet (5 mg total) by mouth 3 (three) times daily with meals as needed. While awake. Do not lay down for 3-4 hours after taking 90 tablet 4   Multiple Vitamins-Minerals (ZINC PO) Take 1 tablet by mouth daily.     ondansetron  (ZOFRAN ) 4 MG tablet Take 1 tablet (4 mg total) by mouth every 8 (eight) hours as needed for nausea or vomiting. 20 tablet 0   Polyethyl Glycol-Propyl Glycol (SYSTANE OP) Place 1 drop into both eyes 2 (two) times daily as needed (dryness, irritation.).     rosuvastatin  (CRESTOR ) 5 MG tablet Take 5 mg by mouth every evening.     venetoclax  (VENCLEXTA ) 100 MG tablet Take 1 tablet (100 mg total) by mouth daily. Tablets should be swallowed whole with a meal and a full glass of water. 30 tablet 3   No current facility-administered medications for this visit.    PHYSICAL EXAMINATION: ECOG PERFORMANCE STATUS: 0 - Asymptomatic VITALS: Vitals:   06/01/24 1218  BP: (!) 115/59  Pulse: 66  Resp: 18   Temp: 98.1 F (36.7 C)  SpO2: 100%   Filed Weights   06/01/24 1218  Weight: 170 lb 1.6 oz (77.2 kg)   Body mass index is 25.49 kg/m.  GENERAL: alert, in no acute distress and comfortable SKIN: no acute rashes, no significant lesions EYES: conjunctiva are pink and non-injected, sclera anicteric OROPHARYNX: MMM, no exudates, no oropharyngeal erythema or ulceration NECK: supple, no JVD LYMPH:  no palpable lymphadenopathy in the cervical, axillary or inguinal regions LUNGS: clear to auscultation b/l with normal respiratory effort HEART: regular rate & rhythm ABDOMEN:  normoactive bowel sounds , non tender, not distended, no hepatosplenomegaly Extremity: no pedal edema PSYCH: alert & oriented x 3 with fluent speech NEURO: no focal motor/sensory deficits  LABORATORY DATA:   I have reviewed the data as listed     Latest Ref Rng & Units 06/01/2024   11:59 AM 02/19/2024   11:30 AM 02/11/2024    4:34 PM  CBC EXTENDED  WBC 4.0 - 10.5 K/uL 4.7  3.5  6.2   RBC 4.22 - 5.81 MIL/uL 3.73  3.73  3.77   Hemoglobin 13.0 - 17.0 g/dL 87.9  88.2  88.1   HCT 39.0 - 52.0 % 33.8  33.3  34.0   Platelets 150 - 400 K/uL 86  71  83   NEUT# 1.7 - 7.7 K/uL 2.9  1.8  2.7   Lymph# 0.7 - 4.0 K/uL 1.2  1.1  2.2         Latest Ref Rng & Units 06/01/2024   11:59 AM 02/19/2024   11:30 AM 02/11/2024    4:34 PM  CMP  Glucose 70 - 99 mg/dL 835  764  897   BUN 8 - 23 mg/dL 21  19  22    Creatinine 0.61 - 1.24 mg/dL 8.52  8.48  8.67   Sodium 135 - 145 mmol/L 134  134  134   Potassium 3.5 - 5.1 mmol/L 4.5  4.5  4.2   Chloride 98 - 111 mmol/L 98  99  98   CO2 22 - 32 mmol/L 26  29  25    Calcium  8.9 - 10.3 mg/dL 9.8  9.5  9.7   Total Protein 6.5 - 8.1 g/dL 7.0  6.5  6.6   Total Bilirubin 0.0 - 1.2 mg/dL 0.6  0.6  0.5   Alkaline Phos 38 - 126 U/L 90  88  84   AST  15 - 41 U/L 21  19  23    ALT 0 - 44 U/L 20  21  24     LDH Component     Latest Ref Rng 06/01/2024  LDH     105 - 235 U/L 179        Component     Latest Ref Rng & Units 06/13/2017  HCV Ab     0.0 - 0.9 s/co ratio <0.1  Hepatitis B Surface Ag     Negative Negative  Hep B Core Ab, Tot     Negative Negative             RADIOGRAPHIC STUDIES: I have personally reviewed the radiological images as listed and agreed with the findings in the report. CUP PACEART REMOTE DEVICE CHECK Result Date: 05/22/2024 ILR summary report received. Battery status OK. Normal device function. No new symptom, tachy, brady, or pause episodes. No new AF episodes. Monthly summary reports and ROV/PRN - CS, CVRS  CUP PACEART REMOTE DEVICE CHECK Result Date: 04/20/2024 ILR summary report received. Battery status OK. Normal device function. No new symptom, tachy, brady, or pause episodes. No new AF episodes. Monthly summary reports and ROV/PRN ML, CVRS  CUP PACEART REMOTE DEVICE CHECK Result Date: 03/19/2024 ILR summary report received. Battery status OK. Normal device function. No new symptom, tachy, brady, or pause episodes. No new AF episodes. Monthly summary reports and ROV/PRN ML, CVRS   ASSESSMENT & PLAN:  84 y.o. male with  1. Chronic lymphocytic leukemia. Likely Rai Stage 4, CLL FISH panel with 13 q. deletion   -he presented with Lymphocytosis incidentally noted on routine labs No associated significant anemia or thrombocytopenia. No constitutional symptoms No overt clinically palpable LNadenopathy or hepato-splenomegaly. 13q mutation present    08/28/17 CT C/A/P revealed Normal sized spleen, no enlarged lymph nodes. There is evidence of very small lung nodule, likely related to inflammatory process. Will monitor with CT chest in 12 months     2.      Patient Active Problem List    Diagnosis Date Noted   Neutropenia 10/28/2023   Paroxysmal atrial fibrillation (HCC) 05/20/2023   Macrocytic anemia 05/20/2023   Thrombocytopenia 05/20/2023   Cervical spondylosis with myelopathy and radiculopathy 05/01/2023   Loop recorder:  Abbott Assert-IQ 3 Loop recorder. Serial # [id number] 12/04/2022 12/04/2022   Orthostatic hypotension 10/25/2022   Expressive aphasia 10/25/2022   Bacteremia 06/08/2022   Positive blood culture (Enterococcus faecalis) 06/06/2022   CLL (chronic lymphocytic leukemia) (HCC) 06/06/2022   Chronic kidney disease, stage 3a (HCC) 06/06/2022   PAF (paroxysmal atrial fibrillation) (HCC) 06/04/2022   Type 2 diabetes mellitus with complication, without long-term current use of insulin  (HCC) 12/02/2018   Hyperlipidemia associated with type 2 diabetes mellitus (HCC) 12/02/2018   HTN (hypertension) 04/07/2015   Pre-syncope 04/07/2015       PLAN: - Discussed lab results on 06/01/2024 in detail with patient: - CMP   - Creatinine:  1.47 - Calcium : 9.8 - CBC  - Hemoglobin: 12.0  - WBC: 4.7  - Platelets: 86 - LDH: 179 -patient has no lab or clinical findings suggestive of CLL progression at this time. Infact blood counts continue to improve. -sweats could be from hypoglycemia or hypotension/autonomic dysfunction. -continue Venetoclax  100mg  po daily. No notable toxicities from this dose of venetoclax   FOLLOW-UP in 3 months for labs and follow-up with Dr. Onesimo.  The total time spent in the appointment was 21 minutes* .  All of the patient's questions were answered  and the patient knows to call the clinic with any problems, questions, or concerns.  Emaline Saran MD MS AAHIVMS [facility] Hematology/Oncology Physician [facility]  *Total Encounter Time as defined by the Centers for Medicare and Medicaid Services includes, in addition to the face-to-face time of a patient visit (documented in the note above) non-face-to-face time: obtaining and reviewing outside history, ordering and reviewing medications, tests or procedures, care coordination (communications with other health care professionals or caregivers) and documentation in the medical record.  I, Marijo Sharps, acting as a neurosurgeon for  Emaline Saran, MD.,have documented all relevant documentation on the behalf of Emaline Saran, MD,as directed by  Emaline Saran, MD while in the presence of Emaline Saran, MD.  I have reviewed the above documentation for accuracy and completeness, and I agree with the above.  Emaline Saran, MD      [1]  Social History Tobacco Use   Smoking status: Former    Current packs/day: 0.00    Average packs/day: 1 pack/day for 30.0 years (30.0 ttl pk-yrs)    Types: Cigarettes    Start date: 72    Quit date: 35    Years since quitting: 46.0   Smokeless tobacco: Never   Tobacco comments:    started at age 54  Vaping Use   Vaping status: Never Used  Substance Use Topics   Alcohol use: Never   Drug use: Not Currently   "

## 2024-06-03 ENCOUNTER — Other Ambulatory Visit: Payer: Self-pay | Admitting: Hematology

## 2024-06-03 ENCOUNTER — Other Ambulatory Visit: Payer: Self-pay

## 2024-06-03 MED ORDER — VENETOCLAX 100 MG PO TABS
100.0000 mg | ORAL_TABLET | Freq: Every day | ORAL | 3 refills | Status: AC
  Filled 2024-06-03: qty 56, 56d supply, fill #0
  Filled 2024-06-04: qty 28, 28d supply, fill #0

## 2024-06-04 ENCOUNTER — Other Ambulatory Visit: Payer: Self-pay

## 2024-06-07 ENCOUNTER — Encounter: Payer: Self-pay | Admitting: Hematology

## 2024-06-08 ENCOUNTER — Other Ambulatory Visit (HOSPITAL_COMMUNITY): Payer: Self-pay

## 2024-06-09 ENCOUNTER — Other Ambulatory Visit (HOSPITAL_COMMUNITY): Payer: Self-pay

## 2024-06-09 ENCOUNTER — Other Ambulatory Visit: Payer: Self-pay

## 2024-06-09 NOTE — Progress Notes (Signed)
 Specialty Pharmacy Refill Coordination Note  Spoke with Eric Harrington is a 84 y.o. male contacted today regarding refills of specialty medication(s) Venetoclax  (VENCLEXTA )  Doses on hand: 7  Patient requested: Delivery   Delivery date: 06/12/24   Verified address: [address] [place] [address]  Medication will be filled on 06/11/24

## 2024-06-11 ENCOUNTER — Other Ambulatory Visit: Payer: Self-pay

## 2024-06-11 NOTE — Progress Notes (Signed)
 " Electrophysiology Office Note:   Date:  06/12/2024  ID:  [name], Eric Harrington 06-24-1940, MRN [id number]  Primary Cardiologist: Gordy Bergamo, MD Primary Heart Failure: None Electrophysiologist: Fonda Kitty, MD      History of Present Illness:   Eric Harrington is a 84 y.o. male with h/o AF, HTN, HLD, orthostatic hypotension, DM, CKD 3a, CLL, thrombocytopenia, & TIA seen today for routine electrophysiology followup.   Since last being seen in our clinic the patient reports doing well. He indicates he got a good report from Dr. Onesimo.  Hgb 12, platelets 86k.  He notes his mornings are often difficult until he gets going.  Afternoons are better for him. No AF episodes.  He continues to take midodrine  5mg  1 x daily.  He is not on Florinef  any more. He denies chest pain, palpitations, dyspnea, PND, orthopnea, nausea, vomiting, dizziness, syncope, edema, weight gain, or early satiety.   Review of systems complete and found to be negative unless listed in HPI.   EP Information / Studies Reviewed:    EKG is ordered today. Personal review as below.  EKG Interpretation Date/Time:  Friday June 12 2024 14:00:24 EST Ventricular Rate:  72 PR Interval:  202 QRS Duration:  140 QT Interval:  436 QTC Calculation: 477 R Axis:   -56  Text Interpretation: Normal sinus rhythm Right bundle branch block Left anterior fascicular block Bifascicular block Confirmed by Aniceto Jarvis ([id number]) on 06/12/2024 2:25:27 PM   Arrhythmia / AAD / Pertinent EP Studies AF  Amiodarone  > stopped 11/2023 by Dr. Onesimo / Dr. Bergamo due to blood dyscrasia with CLL but was restarted ~02/2024  Device Abbott Assert IQ ILR implanted 12/04/22 for AF   Physical Exam:   VS:  BP 122/72   Pulse 72   Ht 5' 8.5 (1.74 m)   Wt 170 lb (77.1 kg)   SpO2 99%   BMI 25.47 kg/m    Wt Readings from Last 3 Encounters:  06/12/24 170 lb (77.1 kg)  06/01/24 170 lb 1.6 oz (77.2 kg)  04/05/24 166 lb (75.3 kg)     GEN: Well nourished, well  developed in no acute distress NECK: No JVD; No carotid bruits CARDIAC: Regular rate and rhythm, no murmurs, rubs, gallops RESPIRATORY:  Clear to auscultation without rales, wheezing or rhonchi  ABDOMEN: Soft, non-tender, non-distended EXTREMITIES:  No edema; No deformity   Risk Assessment/Calculations:    CHA2DS2-VASc Score = 6   This indicates a 9.7% annual risk of stroke. The patient's score is based upon: CHF History: 0 HTN History: 1 Diabetes History: 1 Stroke History: 2 Vascular Disease History: 0 Age Score: 2 Gender Score: 0      ASSESSMENT AND PLAN:    Paroxysmal Atrial Fibrillation  High Risk Medication Monitoring: Amiodarone    CHA2DS2-VASc 6 -OAC for stroke prophylaxis  -ILR review shows no AF burden from 04/20/24 - 05/21/24  -amiodarone  100 mg daily  -update amiodarone  labs > CMP, TSH, free T4  -EKG with NSR, stable intervals  -platelets and Hgb are more stable / significantly improved, will have patient come back to discuss with Dr. Kitty regarding candidacy for LAAO, +/- PVI ablation  Secondary Hypercoagulable State  -continue Eliquis  5mg  BID, dose reviewed and appropriate by wt / Cr  Conduction System Disease: RBBB, LAFB -no hx of syncope -no evidence of brady on ILR     Chronic  Orthostatic Hypotension  -midodrine  5mg  TID PRN  -hydration, compression stockings   CLL  Pancytopenia  -per  ONC  -Hgb 12, platelets 86 on 06/01/24 labs    Follow up with Dr. Kennyth in 3 months  Signed, Daphne Barrack, NP-C, AGACNP-BC [facility] - Electrophysiology  06/12/2024, 2:38 PM   "

## 2024-06-12 ENCOUNTER — Encounter: Payer: Self-pay | Admitting: Pulmonary Disease

## 2024-06-12 ENCOUNTER — Ambulatory Visit: Attending: Pulmonary Disease | Admitting: Pulmonary Disease

## 2024-06-12 VITALS — BP 122/72 | HR 72 | Ht 68.5 in | Wt 170.0 lb

## 2024-06-12 DIAGNOSIS — Z79899 Other long term (current) drug therapy: Secondary | ICD-10-CM

## 2024-06-12 DIAGNOSIS — I951 Orthostatic hypotension: Secondary | ICD-10-CM | POA: Diagnosis not present

## 2024-06-12 DIAGNOSIS — C9110 Chronic lymphocytic leukemia of B-cell type not having achieved remission: Secondary | ICD-10-CM | POA: Diagnosis not present

## 2024-06-12 DIAGNOSIS — Z95818 Presence of other cardiac implants and grafts: Secondary | ICD-10-CM

## 2024-06-12 DIAGNOSIS — I480 Paroxysmal atrial fibrillation: Secondary | ICD-10-CM

## 2024-06-12 DIAGNOSIS — D6869 Other thrombophilia: Secondary | ICD-10-CM | POA: Diagnosis not present

## 2024-06-12 NOTE — Patient Instructions (Signed)
 Medication Instructions:  No medications changes today   *If you need a refill on your cardiac medications before your next appointment, please call your pharmacy*  Lab Work: We will update your amiodarone  labs > CMP, TSH, free T4 If you have labs (blood work) drawn today and your tests are completely normal, you will receive your results only by: MyChart Message (if you have MyChart) OR A paper copy in the mail If you have any lab test that is abnormal or we need to change your treatment, we will call you to review the results.  Testing/Procedures: No testing/procedures were scheduled today  Follow-Up: At [facility], you and your health needs are our priority.  As part of our continuing mission to provide you with exceptional heart care, our providers are all part of one team.  This team includes your primary Cardiologist (physician) and Advanced Practice Providers or APPs (Physician Assistants and Nurse Practitioners) who all work together to provide you with the care you need, when you need it.  Your next appointment:   3 month(s)  Provider:   You may see Fonda Kitty, MD or one of the following Advanced Practice Providers on your designated Care Team:    Daphne Barrack, NP    We recommend signing up for the patient portal called MyChart.  Sign up information is provided on this After Visit Summary.  MyChart is used to connect with patients for Virtual Visits (Telemedicine).  Patients are able to view lab/test results, encounter notes, upcoming appointments, etc.  Non-urgent messages can be sent to your provider as well.   To learn more about what you can do with MyChart, go to [email or web address].

## 2024-06-13 LAB — COMPREHENSIVE METABOLIC PANEL WITH GFR
ALT: 23 [IU]/L (ref 0–44)
AST: 20 [IU]/L (ref 0–40)
Albumin: 4.9 g/dL — ABNORMAL HIGH (ref 3.7–4.7)
Alkaline Phosphatase: 112 [IU]/L (ref 48–129)
BUN/Creatinine Ratio: 13 (ref 10–24)
BUN: 22 mg/dL (ref 8–27)
Bilirubin Total: 0.4 mg/dL (ref 0.0–1.2)
CO2: 22 mmol/L (ref 20–29)
Calcium: 9.8 mg/dL (ref 8.6–10.2)
Chloride: 98 mmol/L (ref 96–106)
Creatinine, Ser: 1.68 mg/dL — ABNORMAL HIGH (ref 0.76–1.27)
Globulin, Total: 1.7 g/dL (ref 1.5–4.5)
Glucose: 130 mg/dL — ABNORMAL HIGH (ref 70–99)
Potassium: 4.3 mmol/L (ref 3.5–5.2)
Sodium: 136 mmol/L (ref 134–144)
Total Protein: 6.6 g/dL (ref 6.0–8.5)
eGFR: 41 mL/min/{1.73_m2} — ABNORMAL LOW

## 2024-06-13 LAB — TSH: TSH: 1 u[IU]/mL (ref 0.450–4.500)

## 2024-06-13 LAB — T4, FREE: Free T4: 1.82 ng/dL — ABNORMAL HIGH (ref 0.82–1.77)

## 2024-06-15 ENCOUNTER — Ambulatory Visit: Payer: Self-pay | Admitting: Pulmonary Disease

## 2024-06-21 ENCOUNTER — Ambulatory Visit: Attending: Cardiology

## 2024-06-21 DIAGNOSIS — I480 Paroxysmal atrial fibrillation: Secondary | ICD-10-CM

## 2024-06-22 LAB — CUP PACEART REMOTE DEVICE CHECK
Date Time Interrogation Session: 20260201054134
Implantable Pulse Generator Implant Date: 20240716
Pulse Gen Model: 5000
Pulse Gen Serial Number: 511029620

## 2024-06-23 NOTE — Progress Notes (Signed)
 Team,   I messaged him but it does not show he has read it. Would you please give him a call?  Thank you!  Daphne

## 2024-06-26 NOTE — Progress Notes (Signed)
 Remote Loop Recorder Transmission

## 2024-07-06 ENCOUNTER — Ambulatory Visit: Admitting: Cardiology

## 2024-07-22 ENCOUNTER — Ambulatory Visit

## 2024-08-22 ENCOUNTER — Ambulatory Visit

## 2024-08-31 ENCOUNTER — Inpatient Hospital Stay: Admitting: Hematology

## 2024-09-07 ENCOUNTER — Ambulatory Visit: Admitting: Pulmonary Disease

## 2024-09-22 ENCOUNTER — Ambulatory Visit

## 2024-10-23 ENCOUNTER — Ambulatory Visit

## 2024-11-23 ENCOUNTER — Ambulatory Visit

## 2024-12-24 ENCOUNTER — Ambulatory Visit

## 2025-01-24 ENCOUNTER — Ambulatory Visit

## 2025-02-24 ENCOUNTER — Ambulatory Visit
